# Patient Record
Sex: Female | Born: 1940 | Race: White | Hispanic: No | Marital: Married | Smoking: Former smoker
Health system: Southern US, Community
[De-identification: ages and names within clinical notes are randomized; demographics above are authoritative.]

## PROBLEM LIST (undated history)

## (undated) DIAGNOSIS — G8929 Other chronic pain: Secondary | ICD-10-CM

## (undated) DIAGNOSIS — K5790 Diverticulosis of intestine, part unspecified, without perforation or abscess without bleeding: Secondary | ICD-10-CM

## (undated) DIAGNOSIS — K5190 Ulcerative colitis, unspecified, without complications: Secondary | ICD-10-CM

## (undated) DIAGNOSIS — M5412 Radiculopathy, cervical region: Secondary | ICD-10-CM

## (undated) DIAGNOSIS — M7580 Other shoulder lesions, unspecified shoulder: Secondary | ICD-10-CM

## (undated) DIAGNOSIS — K559 Vascular disorder of intestine, unspecified: Secondary | ICD-10-CM

## (undated) DIAGNOSIS — L570 Actinic keratosis: Secondary | ICD-10-CM

## (undated) DIAGNOSIS — H269 Unspecified cataract: Secondary | ICD-10-CM

## (undated) DIAGNOSIS — K648 Other hemorrhoids: Secondary | ICD-10-CM

## (undated) DIAGNOSIS — E785 Hyperlipidemia, unspecified: Secondary | ICD-10-CM

## (undated) DIAGNOSIS — K209 Esophagitis, unspecified: Secondary | ICD-10-CM

## (undated) DIAGNOSIS — K2210 Ulcer of esophagus without bleeding: Secondary | ICD-10-CM

## (undated) DIAGNOSIS — G43909 Migraine, unspecified, not intractable, without status migrainosus: Secondary | ICD-10-CM

## (undated) DIAGNOSIS — R002 Palpitations: Secondary | ICD-10-CM

## (undated) DIAGNOSIS — R51 Headache: Secondary | ICD-10-CM

## (undated) DIAGNOSIS — T7840XA Allergy, unspecified, initial encounter: Secondary | ICD-10-CM

## (undated) DIAGNOSIS — R0789 Other chest pain: Secondary | ICD-10-CM

## (undated) DIAGNOSIS — D072 Carcinoma in situ of vagina: Secondary | ICD-10-CM

## (undated) DIAGNOSIS — F419 Anxiety disorder, unspecified: Secondary | ICD-10-CM

## (undated) DIAGNOSIS — M1990 Unspecified osteoarthritis, unspecified site: Secondary | ICD-10-CM

## (undated) DIAGNOSIS — K219 Gastro-esophageal reflux disease without esophagitis: Secondary | ICD-10-CM

## (undated) DIAGNOSIS — R519 Headache, unspecified: Secondary | ICD-10-CM

## (undated) HISTORY — PX: CATARACT EXTRACTION: SUR2

## (undated) HISTORY — DX: Radiculopathy, cervical region: M54.12

## (undated) HISTORY — DX: Anxiety disorder, unspecified: F41.9

## (undated) HISTORY — DX: Other hemorrhoids: K64.8

## (undated) HISTORY — DX: Vascular disorder of intestine, unspecified: K55.9

## (undated) HISTORY — PX: COLPOSCOPY: SHX161

## (undated) HISTORY — DX: Hyperlipidemia, unspecified: E78.5

## (undated) HISTORY — PX: OTHER SURGICAL HISTORY: SHX169

## (undated) HISTORY — DX: Other shoulder lesions, unspecified shoulder: M75.80

## (undated) HISTORY — DX: Other chest pain: R07.89

## (undated) HISTORY — DX: Carcinoma in situ of vagina: D07.2

## (undated) HISTORY — DX: Diverticulosis of intestine, part unspecified, without perforation or abscess without bleeding: K57.90

## (undated) HISTORY — DX: Headache, unspecified: R51.9

## (undated) HISTORY — DX: Ulcerative colitis, unspecified, without complications: K51.90

## (undated) HISTORY — PX: AUGMENTATION MAMMAPLASTY: SUR837

## (undated) HISTORY — DX: Unspecified osteoarthritis, unspecified site: M19.90

## (undated) HISTORY — DX: Esophagitis, unspecified: K20.9

## (undated) HISTORY — DX: Gastro-esophageal reflux disease without esophagitis: K21.9

## (undated) HISTORY — DX: Unspecified cataract: H26.9

## (undated) HISTORY — DX: Palpitations: R00.2

## (undated) HISTORY — DX: Ulcer of esophagus without bleeding: K22.10

## (undated) HISTORY — DX: Headache: R51

## (undated) HISTORY — DX: Actinic keratosis: L57.0

## (undated) HISTORY — DX: Other chronic pain: G89.29

## (undated) HISTORY — DX: Allergy, unspecified, initial encounter: T78.40XA

## (undated) HISTORY — PX: COLONOSCOPY: SHX174

---

## 1976-12-15 HISTORY — PX: VAGINAL HYSTERECTOMY: SUR661

## 1996-12-15 HISTORY — PX: OTHER SURGICAL HISTORY: SHX169

## 1997-08-15 DIAGNOSIS — D072 Carcinoma in situ of vagina: Secondary | ICD-10-CM

## 1997-08-15 HISTORY — DX: Carcinoma in situ of vagina: D07.2

## 1998-12-12 ENCOUNTER — Ambulatory Visit (HOSPITAL_COMMUNITY): Admission: RE | Admit: 1998-12-12 | Discharge: 1998-12-12 | Payer: Self-pay | Admitting: Gynecology

## 1998-12-12 ENCOUNTER — Encounter: Payer: Self-pay | Admitting: Gynecology

## 2000-02-27 ENCOUNTER — Encounter: Payer: Self-pay | Admitting: Gynecology

## 2000-02-27 ENCOUNTER — Ambulatory Visit (HOSPITAL_COMMUNITY): Admission: RE | Admit: 2000-02-27 | Discharge: 2000-02-27 | Payer: Self-pay | Admitting: Gynecology

## 2001-07-05 ENCOUNTER — Encounter: Payer: Self-pay | Admitting: Gynecology

## 2001-07-05 ENCOUNTER — Encounter: Admission: RE | Admit: 2001-07-05 | Discharge: 2001-07-05 | Payer: Self-pay | Admitting: Gynecology

## 2001-07-26 ENCOUNTER — Other Ambulatory Visit: Admission: RE | Admit: 2001-07-26 | Discharge: 2001-07-26 | Payer: Self-pay | Admitting: Gynecology

## 2002-07-23 ENCOUNTER — Emergency Department (HOSPITAL_COMMUNITY): Admission: EM | Admit: 2002-07-23 | Discharge: 2002-07-23 | Payer: Self-pay | Admitting: Emergency Medicine

## 2002-08-09 ENCOUNTER — Encounter: Payer: Self-pay | Admitting: Gynecology

## 2002-08-09 ENCOUNTER — Encounter: Admission: RE | Admit: 2002-08-09 | Discharge: 2002-08-09 | Payer: Self-pay | Admitting: Gynecology

## 2002-08-25 ENCOUNTER — Other Ambulatory Visit: Admission: RE | Admit: 2002-08-25 | Discharge: 2002-08-25 | Payer: Self-pay | Admitting: Gynecology

## 2002-09-23 ENCOUNTER — Encounter: Admission: RE | Admit: 2002-09-23 | Discharge: 2002-09-23 | Payer: Self-pay | Admitting: Gynecology

## 2002-09-23 ENCOUNTER — Encounter: Payer: Self-pay | Admitting: Gynecology

## 2003-08-28 ENCOUNTER — Other Ambulatory Visit: Admission: RE | Admit: 2003-08-28 | Discharge: 2003-08-28 | Payer: Self-pay | Admitting: Gynecology

## 2003-12-29 ENCOUNTER — Encounter: Admission: RE | Admit: 2003-12-29 | Discharge: 2003-12-29 | Payer: Self-pay | Admitting: Gynecology

## 2004-09-24 ENCOUNTER — Other Ambulatory Visit: Admission: RE | Admit: 2004-09-24 | Discharge: 2004-09-24 | Payer: Self-pay | Admitting: Gynecology

## 2005-01-14 ENCOUNTER — Ambulatory Visit: Payer: Self-pay | Admitting: Gastroenterology

## 2005-05-01 ENCOUNTER — Encounter: Admission: RE | Admit: 2005-05-01 | Discharge: 2005-05-01 | Payer: Self-pay | Admitting: Gynecology

## 2005-10-31 ENCOUNTER — Other Ambulatory Visit: Admission: RE | Admit: 2005-10-31 | Discharge: 2005-10-31 | Payer: Self-pay | Admitting: Obstetrics and Gynecology

## 2006-05-29 ENCOUNTER — Encounter: Admission: RE | Admit: 2006-05-29 | Discharge: 2006-05-29 | Payer: Self-pay | Admitting: Obstetrics and Gynecology

## 2007-07-02 ENCOUNTER — Encounter: Admission: RE | Admit: 2007-07-02 | Discharge: 2007-07-02 | Payer: Self-pay | Admitting: Obstetrics and Gynecology

## 2007-09-16 ENCOUNTER — Ambulatory Visit (HOSPITAL_COMMUNITY): Admission: RE | Admit: 2007-09-16 | Discharge: 2007-09-16 | Payer: Self-pay | Admitting: Emergency Medicine

## 2008-01-11 ENCOUNTER — Encounter: Payer: Self-pay | Admitting: Internal Medicine

## 2008-01-24 ENCOUNTER — Encounter: Payer: Self-pay | Admitting: Internal Medicine

## 2008-01-24 HISTORY — PX: NM MYOCAR PERF WALL MOTION: HXRAD629

## 2008-04-13 ENCOUNTER — Emergency Department (HOSPITAL_COMMUNITY): Admission: EM | Admit: 2008-04-13 | Discharge: 2008-04-13 | Payer: Self-pay | Admitting: Family Medicine

## 2008-06-05 DIAGNOSIS — K559 Vascular disorder of intestine, unspecified: Secondary | ICD-10-CM

## 2008-06-05 DIAGNOSIS — K5090 Crohn's disease, unspecified, without complications: Secondary | ICD-10-CM | POA: Insufficient documentation

## 2008-06-05 HISTORY — DX: Vascular disorder of intestine, unspecified: K55.9

## 2008-06-06 ENCOUNTER — Ambulatory Visit: Payer: Self-pay | Admitting: Gastroenterology

## 2008-06-06 LAB — CONVERTED CEMR LAB
ALT: 14 units/L (ref 0–35)
Albumin: 4 g/dL (ref 3.5–5.2)
Alkaline Phosphatase: 76 units/L (ref 39–117)
Basophils Absolute: 0 10*3/uL (ref 0.0–0.1)
Basophils Relative: 0.7 % (ref 0.0–1.0)
Bilirubin, Direct: 0.1 mg/dL (ref 0.0–0.3)
Calcium: 9.1 mg/dL (ref 8.4–10.5)
GFR calc non Af Amer: 89 mL/min
HCT: 37.2 % (ref 36.0–46.0)
Hemoglobin: 12.7 g/dL (ref 12.0–15.0)
Monocytes Absolute: 0.5 10*3/uL (ref 0.1–1.0)
Monocytes Relative: 13.6 % — ABNORMAL HIGH (ref 3.0–12.0)
RBC: 3.85 M/uL — ABNORMAL LOW (ref 3.87–5.11)
RDW: 12.6 % (ref 11.5–14.6)
Sodium: 137 meq/L (ref 135–145)
TSH: 1.43 microintl units/mL (ref 0.35–5.50)
Total Bilirubin: 1.2 mg/dL (ref 0.3–1.2)
Total Protein: 7.4 g/dL (ref 6.0–8.3)

## 2008-06-07 ENCOUNTER — Encounter: Payer: Self-pay | Admitting: Gastroenterology

## 2008-06-09 ENCOUNTER — Telehealth: Payer: Self-pay | Admitting: Gastroenterology

## 2008-06-20 ENCOUNTER — Telehealth: Payer: Self-pay | Admitting: Gastroenterology

## 2008-06-20 ENCOUNTER — Ambulatory Visit: Payer: Self-pay | Admitting: Gastroenterology

## 2008-06-20 DIAGNOSIS — R197 Diarrhea, unspecified: Secondary | ICD-10-CM

## 2008-06-21 ENCOUNTER — Encounter: Payer: Self-pay | Admitting: Gastroenterology

## 2008-06-21 ENCOUNTER — Ambulatory Visit: Payer: Self-pay | Admitting: Gastroenterology

## 2008-06-21 LAB — HM COLONOSCOPY

## 2008-06-23 ENCOUNTER — Encounter: Payer: Self-pay | Admitting: Gastroenterology

## 2008-06-26 ENCOUNTER — Telehealth: Payer: Self-pay | Admitting: Gastroenterology

## 2008-07-24 ENCOUNTER — Ambulatory Visit: Payer: Self-pay | Admitting: Gastroenterology

## 2008-07-24 DIAGNOSIS — F411 Generalized anxiety disorder: Secondary | ICD-10-CM | POA: Insufficient documentation

## 2008-07-24 LAB — CONVERTED CEMR LAB: Transferrin: 271.9 mg/dL (ref >=212.0)

## 2008-08-02 ENCOUNTER — Telehealth: Payer: Self-pay | Admitting: Gastroenterology

## 2008-08-09 ENCOUNTER — Telehealth: Payer: Self-pay | Admitting: Gastroenterology

## 2008-08-25 ENCOUNTER — Encounter: Admission: RE | Admit: 2008-08-25 | Discharge: 2008-08-25 | Payer: Self-pay | Admitting: Obstetrics and Gynecology

## 2008-10-27 ENCOUNTER — Ambulatory Visit: Payer: Self-pay | Admitting: Gastroenterology

## 2009-06-12 LAB — CONVERTED CEMR LAB: Pap Smear: NORMAL

## 2009-06-12 LAB — HM MAMMOGRAPHY: HM Mammogram: NORMAL

## 2009-06-20 ENCOUNTER — Encounter: Payer: Self-pay | Admitting: Internal Medicine

## 2009-08-27 ENCOUNTER — Encounter: Admission: RE | Admit: 2009-08-27 | Discharge: 2009-08-27 | Payer: Self-pay | Admitting: Obstetrics and Gynecology

## 2009-11-05 ENCOUNTER — Telehealth: Payer: Self-pay | Admitting: Gastroenterology

## 2009-11-06 ENCOUNTER — Ambulatory Visit: Payer: Self-pay | Admitting: Gastroenterology

## 2009-11-06 DIAGNOSIS — R1110 Vomiting, unspecified: Secondary | ICD-10-CM | POA: Insufficient documentation

## 2009-11-06 DIAGNOSIS — K219 Gastro-esophageal reflux disease without esophagitis: Secondary | ICD-10-CM | POA: Insufficient documentation

## 2009-11-06 DIAGNOSIS — R300 Dysuria: Secondary | ICD-10-CM

## 2009-11-06 DIAGNOSIS — K5900 Constipation, unspecified: Secondary | ICD-10-CM | POA: Insufficient documentation

## 2009-11-06 LAB — CONVERTED CEMR LAB
Hemoglobin, Urine: NEGATIVE
Leukocytes, UA: NEGATIVE
Nitrite: NEGATIVE
Total Protein, Urine: NEGATIVE mg/dL
Urine Glucose: NEGATIVE mg/dL
Urobilinogen, UA: 0.2 (ref 0.0–1.0)
pH: 7.5 (ref 5.0–8.0)

## 2009-11-14 ENCOUNTER — Ambulatory Visit: Payer: Self-pay | Admitting: Gastroenterology

## 2009-11-14 DIAGNOSIS — K219 Gastro-esophageal reflux disease without esophagitis: Secondary | ICD-10-CM

## 2009-11-14 HISTORY — DX: Gastro-esophageal reflux disease without esophagitis: K21.9

## 2010-01-08 ENCOUNTER — Ambulatory Visit: Payer: Self-pay | Admitting: Gastroenterology

## 2010-02-05 ENCOUNTER — Ambulatory Visit: Payer: Self-pay | Admitting: Internal Medicine

## 2010-02-05 DIAGNOSIS — R51 Headache: Secondary | ICD-10-CM | POA: Insufficient documentation

## 2010-02-05 DIAGNOSIS — L259 Unspecified contact dermatitis, unspecified cause: Secondary | ICD-10-CM | POA: Insufficient documentation

## 2010-02-05 DIAGNOSIS — R519 Headache, unspecified: Secondary | ICD-10-CM | POA: Insufficient documentation

## 2010-02-07 ENCOUNTER — Ambulatory Visit: Payer: Self-pay | Admitting: Internal Medicine

## 2010-02-07 LAB — CONVERTED CEMR LAB
ALT: 45 units/L — ABNORMAL HIGH (ref 0–35)
Albumin: 4.7 g/dL (ref 3.5–5.2)
BUN: 14 mg/dL (ref 6–23)
Bilirubin, Direct: 0.2 mg/dL (ref 0.0–0.3)
Chloride: 105 meq/L (ref 96–112)
Cholesterol: 222 mg/dL — ABNORMAL HIGH (ref 0–200)
LDL Cholesterol: 139 mg/dL — ABNORMAL HIGH (ref 0–99)
Potassium: 4.4 meq/L (ref 3.5–5.3)
TSH: 2.723 microintl units/mL (ref 0.350–4.500)
Total Bilirubin: 0.9 mg/dL (ref 0.3–1.2)
Total CHOL/HDL Ratio: 3.1

## 2010-02-08 ENCOUNTER — Encounter: Payer: Self-pay | Admitting: Internal Medicine

## 2010-02-13 ENCOUNTER — Ambulatory Visit: Payer: Self-pay | Admitting: Internal Medicine

## 2010-04-11 ENCOUNTER — Ambulatory Visit: Payer: Self-pay | Admitting: Internal Medicine

## 2010-07-09 ENCOUNTER — Telehealth: Payer: Self-pay | Admitting: Internal Medicine

## 2010-07-11 ENCOUNTER — Encounter: Payer: Self-pay | Admitting: Internal Medicine

## 2010-07-11 LAB — CONVERTED CEMR LAB
AST: 24 units/L (ref 0–37)
Indirect Bilirubin: 0.6 mg/dL (ref 0.0–0.9)
Total Bilirubin: 0.7 mg/dL (ref 0.3–1.2)

## 2010-07-16 ENCOUNTER — Ambulatory Visit: Payer: Self-pay | Admitting: Internal Medicine

## 2010-07-16 DIAGNOSIS — J309 Allergic rhinitis, unspecified: Secondary | ICD-10-CM

## 2010-07-29 ENCOUNTER — Ambulatory Visit: Payer: Self-pay | Admitting: Internal Medicine

## 2010-07-29 DIAGNOSIS — L0390 Cellulitis, unspecified: Secondary | ICD-10-CM

## 2010-07-29 DIAGNOSIS — L0291 Cutaneous abscess, unspecified: Secondary | ICD-10-CM | POA: Insufficient documentation

## 2010-09-10 ENCOUNTER — Encounter: Admission: RE | Admit: 2010-09-10 | Discharge: 2010-09-10 | Payer: Self-pay | Admitting: Obstetrics and Gynecology

## 2010-11-14 ENCOUNTER — Encounter: Payer: Self-pay | Admitting: Internal Medicine

## 2011-01-16 NOTE — Assessment & Plan Note (Signed)
Summary: INFECTED SPOT ON ARM  /HEA   Vital Signs:  Patient profile:   70 year old female Weight:      135.75 pounds BMI:     22.67 O2 Sat:      98 % on Room air Temp:     98.2 degrees F oral Pulse rate:   60 / minute Pulse rhythm:   regular Resp:     16 per minute BP sitting:   126 / 60  (right arm) Cuff size:   regular  Vitals Entered By: Glendell Docker CMA (July 29, 2010 10:40 AM)  O2 Flow:  Room air CC: Left elbow infection Is Patient Diabetic? No Pain Assessment Patient in pain? no        Primary Care [facility]:  Dondra Spry DO  CC:  Left elbow infection.  History of Present Illness: 70 y/o white female c/o abrasion on left elbow  she scraped left elbow  against storm door 10 days ago  states the area has gotten bigger over the past 2 weeks. She denies temperature no drainage  Allergies (verified): No Known Drug Allergies  Past History:  Past Medical History: Anxiety Disorder - effexor helps  75 mg  psychiatrist  Dr. Evelene Croon Chronic Headaches - migraines ( good response to metoprolol) Mild Ulcerative Colitis    Atypical chest pain - Stress test 01/24/2008    EF 86 %, breast attenuation,  no significant ischemia Generalized osteoarthritis of hands  Past Surgical History: Hysterectomy partial 1978      Family History: Family History of Breast Cancer: Mother Family History of Colon Cancer: Grandfather ? Family History of Prostate Cancer: Father Family History of Inflammatory Bowel Disease: Sister Family History of Diabetes: Mother Family History of Heart Disease: Mother (died age 19)  dad left family when pt was age 55      Social History: Occupation: Retired (Public affairs consultant business) Married 20 yrs (second marriage) 1 son  (lives in [place])  Patient is a former smoker. -stopped 20+ years ago Alcohol Use - yes-1 glass every few days Daily Caffeine Use-2 cups daily Illicit Drug Use - no   Patient gets regular exercise. Former smoker - Quit 22  yrs ago - smoked on and off 10 yrs (1ppd)  Physical Exam  General:  alert, well-developed, and well-nourished.   Lungs:  normal respiratory effort and normal breath sounds.   Heart:  normal rate, regular rhythm, and no gallop.   Skin:  1-2 cm annular shallow wound left elbow,  mild redness,  no drainage   Impression & Recommendations:  Problem # 1:  CELLULITIS (ICD-682.9) pt suffered abrasion on left elbow 10 days ago.  scraped against storm door. area getting bigger and not healing pt with mild cellulitus.  1-2 cm shallow abrasion.  tx with keflex.  pt advised to keep area covered. Call our office if your symptoms do not  improve or gets worse. see is allergic to triple abx ointment  Her updated medication list for this problem includes:    Cephalexin 500 Mg Caps (Cephalexin) ..... One cap three times a day  Complete Medication List: 1)  Metoprolol Succinate 25 Mg Tb24 (Metoprolol succinate) .... Take 1 tablet by mouth once a day for headaches 2)  Venlafaxine Hcl 75 Mg Xr24h-cap (Venlafaxine hcl) .... One by mouth once daily 3)  Tums 500 Mg Chew (Calcium carbonate antacid) .... Two to three tablets by mouth daily as needed for heartburn. will take more if needed 4)  Miralax  Powd (Polyethylene glycol 3350) .... Take once or twice daily as needed 5)  Zantac 150 Mg Tabs (Ranitidine hcl) .... Take 1 tablet by mouth once a day as needed 6)  Multivitamins Tabs (Multiple vitamin) .... Take 1 tablet by mouth once a day 7)  Zostavax 95621 Unt/0.27ml Solr (Zoster vaccine live) .... Administer vaccine x 1 8)  Sumatriptan Succinate 50 Mg Tabs (Sumatriptan succinate) .... One by mouth once daily as needed for severe migraine headache 9)  Cetirizine Hcl 10 Mg Tabs (Cetirizine hcl) .... Take 1 tablet by mouth once a day as needed 10)  Azelastine Hcl 137 Mcg/spray Soln (Azelastine hcl) .... 2 sprays each nostril two times a day 11)  Fluticasone Propionate 50 Mcg/act Susp (Fluticasone propionate)  .... 2 sprays each nostril once daily 12)  Cephalexin 500 Mg Caps (Cephalexin) .... One cap three times a day  Patient Instructions: 1)  Call our office if your symptoms do not  improve or gets worse. Prescriptions: CEPHALEXIN 500 MG CAPS (CEPHALEXIN) one cap three times a day  #21 x 0   Entered and Authorized by:   D. Thomos Lemons DO   Signed by:   D. Thomos Lemons DO on 07/29/2010   Method used:   Electronically to        [facility]  [phone]* (retail)       [address]       [place], [place]  [address]       Ph: [phone] or [phone]       Fax: [phone]   RxID:   [id number]    Immunization History:  Tetanus/Td Immunization History:    Tetanus/Td:  historical (05/28/2004)  Pneumovax Immunization History:    Pneumovax:  historical (05/27/2005)   Current Allergies (reviewed today): No known allergies

## 2011-01-16 NOTE — Letter (Signed)
   [place] [facility] [address] [place], [place]  [address]  [place] Phone: [phone]      February 08, 2010   [facility] [place] [address] [place], [place] [address]  RE:  LAB RESULTS  Dear  Ms. SCHREFFLER,  The following is an interpretation of your most recent lab tests.  Please take note of any instructions provided or changes to medications that have resulted from your lab work.  ELECTROLYTES:  Good - no changes needed  KIDNEY FUNCTION TESTS:  Good - no changes needed  LIVER FUNCTION TESTS:  Stable - no changes needed  LIPID PANEL:  Fair - review at your next visit Triglyceride: 54   Cholesterol: 222   LDL: 139   HDL: 72   Chol/HDL%:  3.1 Ratio  THYROID STUDIES:  Thyroid studies normal TSH: 2.723      Diabetes blood test - normal   Please decrease your intake of saturated fats.  Use fiber supplement once daily.  I will further discuss your lab results at your next follow up appointment.     Sincerely Yours,    Dr. Thomos Lemons

## 2011-01-16 NOTE — Assessment & Plan Note (Signed)
Summary: allergies/mhf   Vital Signs:  Patient profile:   70 year old female Height:      65 inches Weight:      136 pounds BMI:     22.71 O2 Sat:      98 % on Room air Temp:     98.0 degrees F oral Pulse rate:   57 / minute Pulse rhythm:   regular Resp:     16 per minute BP sitting:   114 / 70  (right arm) Cuff size:   regular  Vitals Entered By: Glendell Docker CMA (July 16, 2010 10:49 AM)  O2 Flow:  Room air CC: Rm 3- Allergy flare up Is Patient Diabetic? No Pain Assessment Patient in pain? no       Does patient need assistance? Functional Status Self care Ambulation Normal Comments c/o sneezin, nasal drainage, eye irritation, taken over the counter allergy releif, with some relief, but does not last through out the day, ongoing and has worsened over the past 3 months   Primary Care Provider:  DThomos Lemons DO  CC:  Rm 3- Allergy flare up.  History of Present Illness:       This is a 70 year old female who presents with allergic rhinitis.  The patient complains of runny nose and nasal congestion.  Patient states symptoms are worse with exposure to indoors and outdoors.  Symptoms improve with antihistamines and steroid nasal spray.    Headaches - stable  GERD - able to stop PPI.  using zantac as needed H. Pylori antibody was negative  Preventive Screening-Counseling & Management  Alcohol-Tobacco     Smoking Status: quit  Allergies (verified): No Known Drug Allergies  Past History:  Past Medical History: Anxiety Disorder - effexor helps  75 mg  psychiatrist  Dr. Evelene Croon Chronic Headaches - migraines ( good response to metoprolol) Mild Ulcerative Colitis   Atypical chest pain - Stress test 01/24/2008    EF 86 %, breast attenuation,  no significant ischemia Generalized osteoarthritis of hands  Past Surgical History: Hysterectomy partial 1978      Family History: Family History of Breast Cancer: Mother Family History of Colon Cancer: Grandfather  ? 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 -stopped 20+ years ago Alcohol Use - yes-1 glass every few days Daily Caffeine Use-2 cups daily  Illicit Drug Use - no   Patient gets regular exercise. Former smoker - Quit 22 yrs ago - smoked on and off 10 yrs (1ppd)  Physical Exam  General:  alert, well-developed, and well-nourished.   Ears:  R ear normal and L ear normal.   Nose:  nasal dischargemucosal pallor.   Mouth:  Oral mucosa and oropharynx without lesions or exudates.  Teeth in good repair. Lungs:  normal respiratory effort, normal breath sounds, no crackles, and no wheezes.   Heart:  normal rate, regular rhythm, no murmur, and no gallop.     Impression & Recommendations:  Problem # 1:  ALLERGIC RHINITIS (ICD-477.9) Assessment Deteriorated chronic allergic rhinitis.  add astelin nose spray  Her updated medication list for this problem includes:    Cetirizine Hcl 10 Mg Tabs (Cetirizine  hcl) ..... Take 1 tablet by mouth once a day as needed    Azelastine Hcl 137 Mcg/spray Soln (Azelastine hcl) .Marland Kitchen... 2 sprays each nostril two times a day    Fluticasone Propionate 50 Mcg/act Susp (Fluticasone propionate) .Marland Kitchen... 2 sprays each nostril once daily  Problem # 2:  GERD (ICD-530.81) pt able to taper off PPI.  she take zantac as needed.  H Pylori antibody was negative Her updated medication list for this problem includes:    Tums 500 Mg Chew (Calcium carbonate antacid) .Marland Kitchen..Marland Kitchen Two to three tablets by mouth daily as needed for heartburn. will take more if needed    Zantac 150 Mg Tabs (Ranitidine hcl) .Marland Kitchen... Take 1 tablet by mouth once a day as  needed  Complete Medication List: 1)  Metoprolol Succinate 25 Mg Tb24 (Metoprolol succinate) .... Take 1 tablet by mouth once a day for headaches 2)  Venlafaxine Hcl 75 Mg Xr24h-cap (Venlafaxine hcl) .... One by mouth once daily 3)  Tums 500 Mg Chew (Calcium carbonate antacid) .... Two to three tablets by mouth daily as needed for heartburn. will take more if needed 4)  Miralax Powd (Polyethylene glycol 3350) .... Take once or twice daily as needed 5)  Zantac 150 Mg Tabs (Ranitidine hcl) .... Take 1 tablet by mouth once a day as needed 6)  Multivitamins Tabs (Multiple vitamin) .... Take 1 tablet by mouth once a day 7)  Zostavax 16109 Unt/0.54ml Solr (Zoster vaccine live) .... Administer vaccine x 1 8)  Sumatriptan Succinate 50 Mg Tabs (Sumatriptan succinate) .... One by mouth once daily as needed for severe migraine headache 9)  Cetirizine Hcl 10 Mg Tabs (Cetirizine hcl) .... Take 1 tablet by mouth once a day as needed 10)  Azelastine Hcl 137 Mcg/spray Soln (Azelastine hcl) .... 2 sprays each nostril two times a day 11)  Fluticasone Propionate 50 Mcg/act Susp (Fluticasone propionate) .... 2 sprays each nostril once daily 12)  Cephalexin 500 Mg Caps (Cephalexin) .... One cap three times a day  Patient Instructions: 1)  Please schedule a follow-up appointment in 1 year. Prescriptions: SUMATRIPTAN SUCCINATE 50 MG TABS (SUMATRIPTAN SUCCINATE) one by mouth once daily as needed for severe migraine headache  #10 x 5   Entered and Authorized by:   D. Thomos Lemons DO   Signed by:   D. Thomos Lemons DO on 07/16/2010   Method used:   Electronically to        [facility]  [phone]* (retail)       [address]       [place], [place]  [address]       Ph: [phone] or [phone]       Fax: [phone]   RxID:   [id number] FLUTICASONE PROPIONATE 50 MCG/ACT SUSP (FLUTICASONE PROPIONATE) 2 sprays each nostril once daily  #1 x 5   Entered and Authorized by:   D.  Thomos Lemons DO   Signed by:   D. Thomos Lemons DO on 07/16/2010   Method used:   Electronically to        [facility]  [phone]* (retail)       [address]       [place], [place]  [address]       Ph: [phone] or [phone]       Fax: [phone]   RxID:   [id number] AZELASTINE HCL 137 MCG/SPRAY SOLN (AZELASTINE  HCL) 2 sprays each nostril two times a day  #1 x 5   Entered and Authorized by:   D. Thomos Lemons DO   Signed by:   D. Thomos Lemons DO on 07/16/2010   Method used:   Electronically to        [facility]  [phone]* (retail)       [address]       [place], [place]  [address]       Ph: [phone] or [phone]       Fax: [phone]   RxID:   [id number]   Current Allergies (reviewed today): No known allergies    Preventive Care Screening  Last Tetanus Booster:    Date:  07/31/2008    Results:  Historical

## 2011-01-16 NOTE — Letter (Signed)
Summary: [facility]   Imported By: Lanelle Bal 02/26/2010 11:11:50  _____________________________________________________________________  External Attachment:    Type:   Image     Comment:   External Document

## 2011-01-16 NOTE — Assessment & Plan Note (Signed)
Summary: 2 MONTH FOLLOW UP/[facility] rsc with pt/mhf   Vital Signs:  Patient profile:   70 year old female Height:      65 inches Weight:      137 pounds BMI:     22.88 O2 Sat:      99 % on Room air Pulse rate:   60 / minute BP sitting:   122 / 74  (left arm) Cuff size:   regular  Vitals Entered By: Payton Spark CMA (April 11, 2010 3:49 PM)  O2 Flow:  Room air CC: 2 month F/U   Primary Care Provider:  Dondra Spry DO  CC:  2 month F/U.  History of Present Illness: 70 y/o white female for f/u. GERD better with PPI use and antireflux measures she never completed blood test for h. pylori  dysthymia - no change.   stays active.  working  Current Medications (verified): 1)  Metoprolol Succinate 25 Mg  Tb24 (Metoprolol Succinate) .... Take 1 Tablet By Mouth Once A Day For Headaches 2)  Effexor Xr 75 Mg Xr24h-Cap (Venlafaxine Hcl) .Marland Kitchen.. 1 Tablet By Mouth Once Daily 3)  Tums 500 Mg Chew (Calcium Carbonate Antacid) .... Two To Three Tablets By Mouth Daily As Needed For Heartburn. Will Take More If Needed 4)  Miralax   Powd (Polyethylene Glycol 3350) .... Take Once or Twice Daily As Needed 5)  Nexium 40 Mg  Cpdr (Esomeprazole Magnesium) .Marland Kitchen.. 1 Capsule Each Day 30 Minutes Before Meal 6)  Multivitamins  Tabs (Multiple Vitamin) .... Take 1 Tablet By Mouth Once A Day 7)  Zostavax 16109 Unt/0.12ml Solr (Zoster Vaccine Live) .... Administer Vaccine X 1 8)  Triamcinolone Acetonide 0.1 % Crea (Triamcinolone Acetonide) .... Apply Two Times A Day To Left Arm  Allergies (verified): No Known Drug Allergies  Past History:  Past Medical History: Anxiety Disorder - effexor helps  75 mg  psychiatrist  Dr. Evelene Croon Chronic Headaches - migraines ( good response to metoprolol) Mild Ulcerative Colitis  Atypical chest pain - Stress test 01/24/2008    EF 86 %, breast attenuation,  no significant ischemia Generalized osteoarthritis of hands  Past Surgical History: Hysterectomy partial 1978     Family  History: Family History of Breast Cancer: Mother Family History of Colon Cancer: Grandfather ? 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 -stopped 20+ years ago Alcohol Use - yes-1 glass every few days Daily Caffeine Use-2 cups daily Illicit Drug Use - no   Patient gets regular exercise. Former smoker - Quit 22 yrs ago - smoked on and off 10 yrs (1ppd)  Physical Exam  General:  alert, well-developed, and well-nourished.   Lungs:  normal respiratory effort, normal breath sounds, no crackles, and no wheezes.   Heart:  normal rate, regular rhythm, no murmur, and no gallop.   Extremities:  No lower extremity edema  Neurologic:  cranial nerves II-XII intact and gait normal.   Psych:  normally interactive, good eye contact, not anxious appearing, and not depressed appearing.     Impression & Recommendations:  Problem # 1:  GERD (ICD-530.81) Assessment Unchanged stable.  reschedule testing for H. Pylori  Her updated medication list for  this problem includes:    Tums 500 Mg Chew (Calcium carbonate antacid) .Marland Kitchen..Marland Kitchen Two to three tablets by mouth daily as needed for heartburn. will take more if needed    Nexium 40 Mg Cpdr (Esomeprazole magnesium) .Marland Kitchen... 1 capsule each day 30 minutes before meal  Future Orders: T- * Misc. Laboratory test [id number]) ... 06/11/2010  Problem # 2:  TRANSAMINASES, SERUM, ELEVATED (ICD-790.4) Mild isolated elevation in ALT.  I suspect drug effect.  monitor LFTs Future Orders: T-Hepatic Function [id number]) ... 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  continue same effexor dose  Complete  Medication List: 1)  Metoprolol Succinate 25 Mg Tb24 (Metoprolol succinate) .... Take 1 tablet by mouth once a day for headaches 2)  Venlafaxine Hcl 75 Mg Xr24h-cap (Venlafaxine hcl) .... One by mouth once daily 3)  Tums 500 Mg Chew (Calcium carbonate antacid) .... Two to three tablets by mouth daily as needed for heartburn. will take more if needed 4)  Miralax Powd (Polyethylene glycol 3350) .... Take once or twice daily as needed 5)  Nexium 40 Mg Cpdr (Esomeprazole magnesium) .Marland Kitchen.. 1 capsule each day 30 minutes before meal 6)  Multivitamins Tabs (Multiple vitamin) .... Take 1 tablet by mouth once a day 7)  Zostavax 91478 Unt/0.60ml Solr (Zoster vaccine live) .... Administer vaccine x 1 8)  Triamcinolone Acetonide 0.1 % Crea (Triamcinolone acetonide) .... Apply two times a day to left arm  Patient Instructions: 1)  Use OTC zantac 150 mg two times a day 2)  If you experience GERD symptoms, switch to OTC omeprazole 20 mg once daily 3)  Please schedule a follow-up appointment in 1 year. Prescriptions: VENLAFAXINE HCL 75 MG XR24H-CAP (VENLAFAXINE HCL) one by mouth once daily  #90 x 3   Entered and Authorized by:   D. Thomos Lemons DO   Signed by:   D. Thomos Lemons DO on 04/11/2010   Method used:   Print then Give to Patient   RxID:   [id number] METOPROLOL SUCCINATE 25 MG  TB24 (METOPROLOL SUCCINATE) Take 1 tablet by mouth once a day for headaches  #90 x 3   Entered and Authorized by:   D. Thomos Lemons DO   Signed by:   D. Thomos Lemons DO on 04/11/2010   Method used:   Print then Give to Patient   RxID:   [id number]

## 2011-01-16 NOTE — Letter (Signed)
Summary: [facility]  [facility]   Imported By: Lanelle Bal 12/04/2010 11:14:30  _____________________________________________________________________  External Attachment:    Type:   Image     Comment:   External Document

## 2011-01-16 NOTE — Assessment & Plan Note (Signed)
Summary: F/U EGD/ dfs   History of Present Illness Visit Type: Follow-up Visit Primary GI MD: Sheryn Bison MD FACP FAGA Primary Provider: n/a Requesting Provider: n/a Chief Complaint: follow-up EGD History of Present Illness:   [name] is much better symptomatically on daily Nexium and antireflux maneuvers. Her endoscopy showed erosive esophagitis and a peptic stricture was dilated. Otherwise she is doing well and denies lower gastrointestinal problems. There have been some question in the past idiopathic colitis but she is not on amino salicylate therapy at this time. She does have chronic constipation for which she uses MiraLax.  Her main complaint is a globus sensation in her throat, throat clearing, and hoarseness. Most of her problems are during the daytime hours and not at night.   GI Review of Systems      Denies abdominal pain, acid reflux, belching, bloating, chest pain, dysphagia with liquids, dysphagia with solids, heartburn, loss of appetite, nausea, vomiting, vomiting blood, weight loss, and  weight gain.        Denies anal fissure, black tarry stools, change in bowel habit, constipation, diarrhea, diverticulosis, fecal incontinence, heme positive stool, hemorrhoids, irritable bowel syndrome, jaundice, light color stool, liver problems, rectal bleeding, and  rectal pain.    Current Medications (verified): 1)  Metoprolol Succinate 25 Mg  Tb24 (Metoprolol Succinate) .... Take 1 Tablet By Mouth Once A Day For Headaches 2)  Asacol 400 Mg  Tbec (Mesalamine) .... Take Two Tabs By Mouth Two Times A Day 3)  Effexor Xr 75 Mg Xr24h-Cap (Venlafaxine Hcl) .Marland Kitchen.. 1 Tablet By Mouth Once Daily 4)  Vitamin D (Ergocalciferol) 50000 Unit Caps (Ergocalciferol) .... One Tablet By Mouth Once A Week 5)  Tums 500 Mg Chew (Calcium Carbonate Antacid) .... Two To Three Tablets By Mouth Daily As Needed For Heartburn. Will Take More If Needed 6)  Miralax   Powd (Polyethylene Glycol 3350) .... Take Once  or Twice Daily As Needed 7)  Nexium 40 Mg  Cpdr (Esomeprazole Magnesium) .Marland Kitchen.. 1 Capsule Each Day 30 Minutes Before Meal  Allergies (verified): No Known Drug Allergies  Past History:  Past medical, surgical, family and social histories (including risk factors) reviewed for relevance to current acute and chronic problems.  Past Medical History: Reviewed history from 06/20/2008 and no changes required. Anxiety Disorder Chronic Headaches Ulcerative Colitis  Past Surgical History: Reviewed history from 06/05/2008 and no changes required. Hysterectomy partial 1978  Family History: Reviewed history from 06/06/2008 and no changes required. Family History of Breast Cancer: Mother Family History of Colon Cancer: Grandfather ? Family History of Prostate Cancer: Father Family History of Inflammatory Bowel Disease: Sister Family History of Diabetes: Mother Family History of Heart Disease: Mother  Social History: Reviewed history from 06/06/2008 and no changes required. Occupation: Retired Patient is a former smoker. -stopped 20+ years ago Alcohol Use - yes-1 glass every few days Daily Caffeine Use-2 cups daily Illicit Drug Use - no Patient gets regular exercise.  Review of Systems       The patient complains of cough.  The patient denies allergy/sinus, anemia, anxiety-new, arthritis/joint pain, back pain, blood in urine, breast changes/lumps, change in vision, confusion, coughing up blood, depression-new, fainting, fatigue, fever, headaches-new, hearing problems, heart murmur, heart rhythm changes, itching, muscle pains/cramps, night sweats, nosebleeds, shortness of breath, skin rash, sleeping problems, sore throat, swelling of feet/legs, swollen lymph glands, thirst - excessive, urination - excessive, urination changes/pain, urine leakage, vision changes, and voice change.    Vital Signs:  Patient profile:  70 year old female Height:      65 inches Weight:      140 pounds Pulse  rate:   68 / minute Pulse rhythm:   regular BP sitting:   122 / 64  (left arm)  Vitals Entered By: Milford Cage NCMA (January 08, 2010 3:52 PM)  Physical Exam  General:  Well developed, well nourished, no acute distress.healthy appearing.   Head:  Normocephalic and atraumatic. Eyes:  PERRLA, no icterus.exam deferred to patient's ophthalmologist.   Neck:  Supple; no masses or thyromegaly. Lungs:  Clear throughout to auscultation. Heart:  Regular rate and rhythm; no murmurs, rubs,  or bruits. Abdomen:  Soft, nontender and nondistended. No masses, hepatosplenomegaly or hernias noted. Normal bowel sounds. Extremities:  No clubbing, cyanosis, edema or deformities noted. Neurologic:  Alert and  oriented x4;  grossly normal neurologically. Cervical Nodes:  No significant cervical adenopathy. Psych:  Alert and cooperative. Normal mood and affect.   Impression & Recommendations:  Problem # 1:  GERD (ICD-530.81) Assessment Improved We change her Nexium to 30 minutes before supper for better acid control during the day. I've urged her to continue regular PPI therapy for several months to control gastroesophageal difficulty she is having. I see no need for further workup at this time. She may need periodic dilations depending on her dysphagia.  Problem # 2:  CONSTIPATION (ICD-564.00) Assessment: Improved Continue MiraLax as needed  Problem # 3:  GENERALIZED ANXIETY DISORDER (ICD-300.02) Assessment: Improved Continue Effexor X. are 75 mg a day.  Patient Instructions: 1)  The medication list was reviewed and reconciled.  All changed / newly prescribed medications were explained.  A complete medication list was provided to the patient / caregiver. 2)  Please continue current medications.  3)  Avoid foods high in acid content ( tomatoes, citrus juices, spicy foods) . Avoid eating within 3 to 4 hours of lying down or before exercising. Do not over eat; try smaller more frequent meals. Elevate  head of bed four inches when sleeping.  4)  Please schedule a follow-up appointment as needed.   Appended Document: F/U EGD/ dfs    Clinical Lists Changes  Medications: Changed medication from NEXIUM 40 MG  CPDR (ESOMEPRAZOLE MAGNESIUM) 1 capsule each day 30 minutes before meal to NEXIUM 40 MG  CPDR (ESOMEPRAZOLE MAGNESIUM) 1 capsule each day 30 minutes before meal - Signed Rx of NEXIUM 40 MG  CPDR (ESOMEPRAZOLE MAGNESIUM) 1 capsule each day 30 minutes before meal;  #30 x 11;  Signed;  Entered by: Ashok Cordia RN;  Authorized by: Mardella Layman MD [facility];  Method used: Electronically to [facility]  [phone]*, [address] [place], [place], [place]  [address], Ph: [phone] or [phone], Fax: [phone]    Prescriptions: NEXIUM 40 MG  CPDR (ESOMEPRAZOLE MAGNESIUM) 1 capsule each day 30 minutes before meal  #30 x 11   Entered by:   Ashok Cordia RN   Authorized by:   Mardella Layman MD [facility]   Signed by:   Ashok Cordia RN on 01/08/2010   Method used:   Electronically to        [facility]  [phone]* (retail)       [address]       [place], [place]  [address]       Ph: [phone] or [phone]       Fax: [phone]   RxID:   [id number]

## 2011-01-16 NOTE — Progress Notes (Signed)
Summary: Labs & Rx  Phone Note Call from Patient Call back at [facility] Phone [phone]   Caller: Patient Call For: [name]  Summary of Call: SHE WAS SCHEDULED TO HAVE LAB WORK IN [place]. SHE DID NOT COME FOR THE LABS.  SHE WOULD LIKE TO COME THIS THURSDAY AND PLEASE VITAMIN D LEVEL TO THE TESTS   WOULD DR Xiomar Crompton GIVE HER AN RX FOR IMITREX FOR HER MIGRAINE HEADACHES SEND TO [facility] ON [address]  Initial call taken by: Roselle Locus,  July 09, 2010 10:24 AM  Follow-up for Phone Call        ok to add vit d level Follow-up by: D. Thomos Lemons DO,  July 09, 2010 1:22 PM  Additional Follow-up for Phone Call Additional follow up Details #1::        she was suppose to have H. Pylori antibody test Additional Follow-up by: D. Thomos Lemons DO,  July 09, 2010 5:09 PM    Additional Follow-up for Phone Call Additional follow up Details #2::    What dx code can I use for the Vitamin D level? Nicki Guadalajara Fergerson CMA Duncan Dull)  July 10, 2010 9:17 AM   use osteopenia code  Additional Follow-up for Phone Call Additional follow up Details #3:: Details for Additional Follow-up Action Taken: Pt notified that rx was sent to pharmacy and order has been faxed to lab for bloodwork tomorrow.  Nicki Guadalajara Fergerson CMA Duncan Dull)  July 10, 2010 12:33 PM   New/Updated Medications: SUMATRIPTAN SUCCINATE 50 MG TABS (SUMATRIPTAN SUCCINATE) one by mouth once daily as needed for severe migraine headache Prescriptions: SUMATRIPTAN SUCCINATE 50 MG TABS (SUMATRIPTAN SUCCINATE) one by mouth once daily as needed for severe migraine headache  #10 x 0   Entered and Authorized by:   D. Thomos Lemons DO   Signed by:   D. Thomos Lemons DO on 07/09/2010   Method used:   Electronically to        [facility]  [phone]* (retail)       [address]       [place], [place]  [address]       Ph: [phone] or [phone]       Fax: [phone]   RxID:   [id number]

## 2011-01-16 NOTE — Assessment & Plan Note (Signed)
Summary: NPX, to be est. - jr   Vital Signs:  Patient profile:   70 year old female Height:      65 inches Weight:      140.50 pounds BMI:     23.46 O2 Sat:      97 % on Room air Temp:     98.1 degrees F oral Pulse rate:   66 / minute Pulse rhythm:   regular Resp:     16 per minute BP sitting:   118 / 66  (right arm) Cuff size:   regular  Vitals Entered By: Glendell Docker CMA (February 05, 2010 1:29 PM)  O2 Flow:  Room air  Contraindications/Deferment of Procedures/Staging:    Test/Procedure: FLU VAX    Reason for deferment: patient declined  CC: RM 2- New Patient  Comments New Patient, Referred by Urbano Heir, establish care, c/o fatigue, discuss Effexor, evaluation of rash on left arm   Primary Care Provider:  Dondra Spry DO  CC:  RM 2- New Patient .  History of Present Illness: 70 y/o white female with hx of dysthymia to establish. she has been on and off effexor for years.   she reports recent death of friend.  took take of elderly gentlemen  she c/o left arm rash.  she likes to walk in woods.  she noticed tick bite in oct.  took doxy x 10 days tick bite in Oct was near buttocks.  later she thought tick bit her on left arm. she was seen by dermatologist.   they did not think rash was related to tick bite  Preventive Screening-Counseling & Management  Alcohol-Tobacco     Alcohol drinks/day: 2     Alcohol type: wine     Smoking Status: quit     Packs/Day: 1.0     Year Started: age 31  Caffeine-Diet-Exercise     Caffeine use/day: 2-3 beverages daily     Does Patient Exercise: yes     Type of exercise: walking     Times/week: 7  Allergies (verified): No Known Drug Allergies  Past History:  Past Medical History: Anxiety Disorder - effexor helps  75 mg  psychiatrist  Dr. Evelene Croon Chronic Headaches - migraines ( good response to metoprolol) Mild Ulcerative Colitis Atypical chest pain - Stress test 01/24/2008    EF 86 %, breast attenuation,  no  significant ischemia Generalized osteoarthritis of hands  Past Surgical History: Hysterectomy partial 1978    Family History: Family History of Breast Cancer: Mother Family History of Colon Cancer: Grandfather ? Family History of Prostate Cancer: Father Family History of Inflammatory Bowel Disease: Sister Family History of Diabetes: Mother Family History of Heart Disease: Mother (died age 33)  dad left family when pt was age 21    Social History: Occupation: Retired (Public affairs consultant business) Married 20 yrs (second marriage) 1 son  (lives in [place]) Patient is a former smoker. -stopped 20+ years ago Alcohol Use - yes-1 glass every few days Daily Caffeine Use-2 cups daily Illicit Drug Use - no  Patient gets regular exercise. Former smoker - Quit 22 yrs ago - smoked on and off 10 yrs (1ppd) Packs/Day:  1.0 Caffeine use/day:  2-3 beverages daily  Review of Systems       she like to take her dogs out for walk in the woods  Physical Exam  General:  alert, well-developed, and well-nourished.   Head:  normocephalic and atraumatic.   Eyes:  pupils equal, pupils round, and  pupils reactive to light.   Ears:  R ear normal and L ear normal.   Mouth:  Oral mucosa and oropharynx without lesions or exudates.  Teeth in good repair. Neck:  No deformities, masses, or tenderness noted.no carotid bruits.   Lungs:  normal respiratory effort and normal breath sounds.   Heart:  normal rate, regular rhythm, no murmur, and no gallop.   Abdomen:  soft, non-tender, normal bowel sounds, no hepatomegaly, and no splenomegaly.   Extremities:  No lower extremity edema  Neurologic:  cranial nerves II-XII intact and gait normal.   Psych:  normally interactive and good eye contact.     Impression & Recommendations:  Problem # 1:  GERD (ICD-530.81) she has chronic GERD.  prev EGD by Dr. Jarold Motto showed esophagitis.  we reviewed anti reflux measures.  avoid caffeine.  avoid chocolate. Her updated  medication list for this problem includes:    Tums 500 Mg Chew (Calcium carbonate antacid) .Marland Kitchen..Marland Kitchen Two to three tablets by mouth daily as needed for heartburn. will take more if needed    Nexium 40 Mg Cpdr (Esomeprazole magnesium) .Marland Kitchen... 1 capsule each day 30 minutes before meal  Problem # 2:  DERMATITIS (ICD-692.9) Intermittent rash on left forearm.  She attributed to poss tick bite.  she was seen by dermatologist who performed biopsy - reported normal. Use topical steroid as directed.  She has hx of GERD.  Check H. Pylori antibody.  Her updated medication list for this problem includes:    Triamcinolone Acetonide 0.1 % Crea (Triamcinolone acetonide) .Marland Kitchen... Apply two times a day to left arm  Problem # 3:  CROHN'S DISEASE (ICD-555.9) she is followed by Dr. Jarold Motto.    Problem # 4:  HEADACHE (ICD-784.0) She has hx of chronic migraines.  symptoms much better since starting B Blockers Her updated medication list for this problem includes:    Metoprolol Succinate 25 Mg Tb24 (Metoprolol succinate) .Marland Kitchen... 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  she tried to taper off in the past by using prozac but experienced withdrawal symptoms.  we discussed possibly switching to pristiq.    Complete Medication List: 1)  Metoprolol Succinate 25 Mg Tb24 (Metoprolol succinate) .... Take 1 tablet by mouth once a day for headaches 2)  Effexor Xr 75 Mg Xr24h-cap (Venlafaxine hcl) .Marland Kitchen.. 1 tablet by mouth once daily 3)  Tums 500 Mg Chew (Calcium carbonate antacid) .... Two to three tablets by mouth daily as needed for heartburn. will take more if needed 4)  Miralax Powd (Polyethylene glycol 3350) .... Take once or twice daily as needed 5)  Nexium 40 Mg Cpdr (Esomeprazole magnesium) .Marland Kitchen.. 1 capsule each day 30 minutes before meal 6)  Multivitamins Tabs (Multiple vitamin) .... Take 1 tablet by mouth once a day 7)  Zostavax 81191 Unt/0.69ml Solr (Zoster  vaccine live) .... Administer vaccine x 1 8)  Triamcinolone Acetonide 0.1 % Crea (Triamcinolone acetonide) .... Apply two times a day to left arm  Patient Instructions: 1)  Please schedule a follow-up appointment in 2 months. 2)  BMP prior to visit, ICD-9:  790.29 3)  Hepatic Panel prior to visit, ICD-9: v70 4)  Lipid Panel prior to visit, ICD-9:v 70 5)  TSH prior to visit, ICD-9: 790.29 6)  HbgA1C prior to visit, ICD-9: 790.29 7)  H. Pylori:  530.81 8)  Please return for lab work within one week. Prescriptions: TRIAMCINOLONE ACETONIDE 0.1 % CREA (TRIAMCINOLONE  ACETONIDE) apply two times a day to left arm  #30 grams x 1   Entered and Authorized by:   D. Thomos Lemons DO   Signed by:   D. Thomos Lemons DO on 02/05/2010   Method used:   Electronically to        [facility]  [phone]* (retail)       [address]       [place], [place]  [address]       Ph: [phone] or [phone]       Fax: [phone]   RxID:   [id number] ZOSTAVAX 19400 UNT/0.65ML SOLR (ZOSTER VACCINE LIVE) administer vaccine x 1  #1 x 0   Entered and Authorized by:   D. Thomos Lemons DO   Signed by:   D. Thomos Lemons DO on 02/05/2010   Method used:   Print then Give to Patient   RxID:   [id number]     Current Allergies (reviewed today): No known allergies    Preventive Care Screening  Last Flu Shot:    Date:  02/05/2010    Results:  Declined   Mammogram:    Date:  06/12/2009    Results:  normal   Pap Smear:    Date:  06/12/2009    Results:  normal   Bone Density:    Date:  06/12/2008    Results:  Osteopenia std dev

## 2011-03-13 ENCOUNTER — Ambulatory Visit: Payer: Self-pay | Admitting: Family

## 2011-03-13 ENCOUNTER — Encounter: Payer: Self-pay | Admitting: Internal Medicine

## 2011-04-22 ENCOUNTER — Telehealth: Payer: Self-pay | Admitting: Internal Medicine

## 2011-04-22 NOTE — Telephone Encounter (Signed)
Opened in error

## 2011-04-22 NOTE — Telephone Encounter (Deleted)
Opened in error

## 2011-05-02 NOTE — Consult Note (Signed)
[name], [name]                        ACCOUNT NO.:  [id number]   MEDICAL RECORD NO.:  [id number]                   PATIENT TYPE:  EMS   LOCATION:  [facility]                                 FACILITY:  [facility]   PHYSICIAN:  Dionne Ano. Everlene Other, M.D.         DATE OF BIRTH:  07-19-1941   DATE OF CONSULTATION:  DATE OF DISCHARGE:                          ORTHOPEDIC SURGERY CONSULTATION   HISTORY OF PRESENT ILLNESS:  I had the pleasure of seeing this patient in  the [facility] Emergency Room upon referral from  emergency room staff.  The patient is a pleasant 70 year old female who is  here today with complaint of right thumb pain worsening x 5 days.  The  patient was in her usual health until five days ago when she began having  severe pain.  She has been on Augmentin for five days given to her at an  urgent care and this has not relieved her symptoms.  Her thumb is swollen,  painful, and difficult to use.  She denies unusual exposure.  The patient  does not work in the [employer].  She notes no inoculation or other trauma  to the thumb.   REVIEW OF SYMPTOMS:  The patient is 70 years old.  She is right-hand  dominant.  She denies other problems on the review of systems.   ALLERGIES:  None.   MEDICATIONS:  1. Augmentin.  2. Bextra.  3. Zomig p.r.n. for headaches.   PAST SURGICAL HISTORY:  1. Hysterectomy.  2. Birthmark removal in her teens.   PAST MEDICAL HISTORY:  Occasional migraines.   SOCIAL HISTORY:  She does not smoke.  She works for [employer].   PHYSICAL EXAMINATION:  GENERAL APPEARANCE:  A white female, alert, oriented,  and in no acute distress.  HEENT:  Normal examination.  ABDOMEN:  Nontender.  CHEST:  Clear.  EXTREMITIES:  The left upper extremity is atraumatic and neurovascularly  intact.  The right upper extremity has a swollen thumb pulp and area about  the radial aspect of her epionychium, which is quite tender.  There is  a  slight bit of fluctuance, but no active discharge.  She has intact capillary  refill.  FPL and EPL are intact.  There are no signs of dystrophic reaction.  Bony architecture is stable to palpation.   ASSESSMENT AND PLAN:  I have examined her length today.  I feel that she  does have a paronychial versus a deep filling process given the location.  Her pulp is rather swollen.  I verbally consented her for I&D.   DESCRIPTION OF PROCEDURE:  The patient was given a lidocaine/Marcaine block  without difficulty about the thumb.  She tolerated this well and then  underwent a Betadine scrub and paint by myself.  Following this, I performed  an incision over the radial aspect of the thumb and immediately encountered  purulence.  The purulent tract was into  the pulp region and about the  paronychial region.  Thus, it appeared that she had some extension of a  paronychial process into her pulp.  I removed a portion of the nail and a  partial nail and plate removal was performed without difficulty utilizing  scissor tip and blunt instrument.  Following this, she was copiously  irrigated.  Cultures were taken during the procedure.  Following copiously  irrigated, she was packed with Iodoform, the tourniquet was deflated, and  she had excellent refill.  The patient did have the tourniquet deflated  prior to irrigation and a large amount of irrigant was placed in the wound I  should note.  Once the wound was packed, it was sterilely dressed after  ensuring excellent refill.  Following sterile dressing, the patient was then  instructed on her DC instructions.   The patient will be discharged home.  I have placed her on Augmentin 875 mg  one p.o. b.i.d. x 10 days.  She will return to the office on Monday for  whirlpool and wound check.  She will elevate and notify use should she have  any problems.  I have given her a prescription for Vicodin and Robaxin to  use for pain and muscle spasm,  respectively.  I have asked her to notify me  immediately should any problems, questions, or concerns arise.  I have gone  over all issues with her length.  All questions have been encouraged and  answered.                                               Dionne Ano. Everlene Other, M.D.    Nash Mantis  D:  07/23/2002  T:  07/27/2002  Job:  [id number]

## 2011-05-08 ENCOUNTER — Telehealth: Payer: Self-pay | Admitting: Internal Medicine

## 2011-05-08 ENCOUNTER — Encounter: Payer: Self-pay | Admitting: Internal Medicine

## 2011-05-08 DIAGNOSIS — E785 Hyperlipidemia, unspecified: Secondary | ICD-10-CM

## 2011-05-08 NOTE — Telephone Encounter (Signed)
PLEASE FAX AN ORDER FOR CPE LABS TO [facility] FOR THE WEEK OF 6-4 AS HAD TO MOVE HER CPE APPT TO WEEK OF June 11 AS DR Pearson Forster

## 2011-05-09 NOTE — Telephone Encounter (Signed)
Lab orders entered for [facility]

## 2011-05-09 NOTE — Telephone Encounter (Signed)
FLP, CRP, BMET - 272.4

## 2011-05-23 LAB — LIPID PANEL
Cholesterol: 217 mg/dL — ABNORMAL HIGH (ref 0–200)
HDL: 69 mg/dL (ref >39)
Total CHOL/HDL Ratio: 3.1 Ratio

## 2011-05-23 LAB — BASIC METABOLIC PANEL
BUN: 15 mg/dL (ref 6–23)
CO2: 28 mEq/L (ref 19–32)
Calcium: 8.9 mg/dL (ref 8.4–10.5)
Creat: 0.61 mg/dL (ref 0.40–1.20)
Glucose, Bld: 105 mg/dL — ABNORMAL HIGH (ref 70–99)

## 2011-05-26 ENCOUNTER — Ambulatory Visit (INDEPENDENT_AMBULATORY_CARE_PROVIDER_SITE_OTHER): Payer: Medicare Other | Admitting: Family Medicine

## 2011-05-26 ENCOUNTER — Encounter: Payer: Self-pay | Admitting: Family Medicine

## 2011-05-26 ENCOUNTER — Encounter: Payer: Self-pay | Admitting: Internal Medicine

## 2011-05-26 DIAGNOSIS — M7580 Other shoulder lesions, unspecified shoulder: Secondary | ICD-10-CM

## 2011-05-26 DIAGNOSIS — M719 Bursopathy, unspecified: Secondary | ICD-10-CM

## 2011-05-26 DIAGNOSIS — J209 Acute bronchitis, unspecified: Secondary | ICD-10-CM | POA: Insufficient documentation

## 2011-05-26 DIAGNOSIS — M67919 Unspecified disorder of synovium and tendon, unspecified shoulder: Secondary | ICD-10-CM

## 2011-05-26 DIAGNOSIS — Z0000 Encounter for general adult medical examination without abnormal findings: Secondary | ICD-10-CM | POA: Insufficient documentation

## 2011-05-26 HISTORY — DX: Other shoulder lesions, unspecified shoulder: M75.80

## 2011-05-26 MED ORDER — METHYLPREDNISOLONE ACETATE 80 MG/ML IJ SUSP: 40.0000 mg | Freq: Once | INTRAMUSCULAR | Status: DC

## 2011-05-26 MED ORDER — ESOMEPRAZOLE MAGNESIUM 40 MG PO CPDR: 40.0000 mg | DELAYED_RELEASE_CAPSULE | Freq: Every day | ORAL | Status: DC

## 2011-05-27 ENCOUNTER — Ambulatory Visit (HOSPITAL_COMMUNITY)
Admission: RE | Admit: 2011-05-27 | Discharge: 2011-05-27 | Disposition: A | Discharge status: Home / Self Care | Payer: Medicare Other | Source: Ambulatory Visit | Attending: Family Medicine | Admitting: Family Medicine

## 2011-05-27 DIAGNOSIS — M25519 Pain in unspecified shoulder: Secondary | ICD-10-CM | POA: Insufficient documentation

## 2011-05-27 DIAGNOSIS — Z87891 Personal history of nicotine dependence: Secondary | ICD-10-CM | POA: Insufficient documentation

## 2011-05-27 DIAGNOSIS — R05 Cough: Secondary | ICD-10-CM | POA: Insufficient documentation

## 2011-05-27 DIAGNOSIS — R059 Cough, unspecified: Secondary | ICD-10-CM | POA: Insufficient documentation

## 2011-05-27 DIAGNOSIS — M542 Cervicalgia: Secondary | ICD-10-CM | POA: Insufficient documentation

## 2011-05-27 NOTE — Assessment & Plan Note (Signed)
Doing well, continue diet/exercise, Ca++ and vit D. She has rx for zostavax and plans on getting this through pharmacy soon. She gets pap/pelvic/mammo through her GYN--all are UTD. Lipids 04/2011 are fine.

## 2011-05-27 NOTE — Assessment & Plan Note (Signed)
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  Will check left shoulder x-ray.  Discussed NSAIDs, relative rest, gentle ROM exercises without resistance and then with resistance after 1 wk or so.

## 2011-05-27 NOTE — Progress Notes (Signed)
OFFICE VISIT  05/27/2011  CC:  Chief Complaint  Patient presents with  . Annual Exam  . Medication Refill    Imitrex     HPI:    Patient is a 70 y.o. Caucasian female who presents for annual exam. We reviewed her recent cholesterol panel and chemistry panel in office today. 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 Jarold Motto.  Complains of 3 wk hx of left shoulder pain, worse with abduction, feels like it pops in/out a bit, has been going to chiropracter for this lately and now upper trapezius area and cervical area on left hurting some.  Asks for left shoulder x-ray. Also asks for chest x-ray, says she's had ongoing cough for 6 wks or so, improved over time but still with feeling of mild fatigue plus has some coughing easily with deep breathing that she does with her yoga.  No SOB, no wheeze.  Ex smoker, quit > 20 yrs ago, no hx of COPD.  No fevers, no wt loss.  Past Medical History  Diagnosis Date  . Arthritis     osteoarthritis, hands  . Anxiety     Effexor helps--psychiatrist Dr Evelene Croon  . Chronic headache   . Mild chronic ulcerative colitis   . Atypical chest pain     EF 86%, breast attenuation, no significant ischemia  . GERD (gastroesophageal reflux disease) 11/2009    EGD: Dr. Jarold Motto: erosive stricture dilated    Past Surgical History  Procedure Date  . Abdominal hysterectomy 1978    partial    Outpatient Prescriptions Prior to Visit  Medication Sig Dispense Refill  . metoprolol succinate (TOPROL-XL) 25 MG 24 hr tablet Take 25 mg by mouth daily. For headaches.       . polyethylene glycol powder (MIRALAX) powder Take 17 g by mouth. Use once or twice a day as needed.       . SUMAtriptan (IMITREX) 50 MG tablet Take 50 mg by mouth daily. For severe headache.       . venlafaxine (EFFEXOR-XR) 75 MG 24 hr capsule Take 75 mg by mouth daily.        . fluticasone (FLONASE) 50  MCG/ACT nasal spray Place 2 sprays into both nostrils daily.       Marland Kitchen azelastine (ASTELIN) 137 MCG/SPRAY nasal spray 2 sprays by Each Nare route 2 (two) times daily. Use in each nostril as directed       . calcium carbonate (TUMS - DOSED IN MG ELEMENTAL CALCIUM) 500 MG chewable tablet Take 2-3 tablets by mouth daily as needed for heartburn. Will take more if needed.       . cetirizine (ZYRTEC) 10 MG tablet Take 10 mg by mouth daily as needed.        . Multiple Vitamin (MULTIVITAMIN) tablet Take 1 tablet by mouth daily.        . ranitidine (ZANTAC) 150 MG tablet Take 150 mg by mouth daily as needed.         No facility-administered medications prior to visit.   History   Social History  . Marital Status: Married    Spouse Name: N/A    Number of Children: N/A  . Years of Education: N/A   Occupational History  . Not on file.   Social History Main Topics  . Smoking status: Former Smoker -- 1.0 packs/day for 10 years    Types: Cigarettes    Quit  date: 12/15/1988  . Smokeless tobacco: Never Used  . Alcohol Use: Yes     1 glass every few days  . Drug Use: No  . Sexually Active: Not on file   Other Topics Concern  . Not on file   Social History Narrative   Regular exercise: yes   Family History  Problem Relation Age of Onset  . Cancer Mother     breast  . Diabetes Mother   . Heart disease Mother   . Cancer Father     prostate  . Inflammatory bowel disease Sister   . Cancer Other     colon     No Known Allergies  ROS Review of Systems  Constitutional: Negative for fever, chills, appetite change and fatigue.  HENT: Negative for ear pain, congestion, sore throat, neck stiffness and dental problem.   Eyes: Negative for discharge, redness and visual disturbance.  Respiratory: Positive for cough. Negative for chest tightness, shortness of breath and wheezing.   Cardiovascular: Negative for chest pain, palpitations and leg swelling.  Gastrointestinal: Negative for nausea,  vomiting, abdominal pain, diarrhea and blood in stool.  Genitourinary: Negative for dysuria, urgency, frequency, hematuria, flank pain and difficulty urinating.  Musculoskeletal: Positive for arthralgias (left shoulder). Negative for myalgias, back pain and joint swelling.  Skin: Negative for pallor and rash.  Neurological: Negative for dizziness, speech difficulty, weakness and headaches.  Hematological: Negative for adenopathy. Does not bruise/bleed easily.  Psychiatric/Behavioral: Negative for confusion and sleep disturbance. The patient is not nervous/anxious.       PE: Blood pressure 112/62, pulse 64, temperature 98.3 F (36.8 C), temperature source Oral, resp. rate 16, height 5' 4.5" (1.638 m), weight 137 lb (62.143 kg), SpO2 96.00%. Gen: Alert, well appearing.  Patient is oriented to person, place, time, and situation. HEENT: Scalp without lesions or hair loss.  Ears: EACs clear, normal epithelium.  TMs with good light reflex and landmarks bilaterally.  Eyes: no injection, icteris, swelling, or exudate.  EOMI, PERRLA. Nose: no drainage or turbinate edema/swelling.  No injection or focal lesion.  Mouth: lips without lesion/swelling.  Oral mucosa pink and moist.  Dentition intact and without obvious caries or gingival swelling.  Oropharynx without erythema, exudate, or swelling.  Neck: supple, ROM full.  Carotids 2+ bilat, without bruit.  No lymphadenopathy, thyromegaly, or mass. Chest: symmetric expansion, nonlabored respirations.  Clear and equal breath sounds in all lung fields.   CV: RRR, no m/r/g.  Peripheral pulses 2+ and symmetric. ABD: soft, NT, ND, BS normal.  No hepatospenomegaly or mass.  No bruits. EXT: no clubbing, cyanosis, or edema.  Neck: ROM fully intact, nontender to palpation.   Left shoulder: mild TTP over deltoid and under acromion.  Pain with ABduction and ADDuction but no weakness, and ROM is still fully intact.  +empty can sign.  Negative drop sign.  Mild increased  laxity of left shoulder GH joint.  Procedure: left shoulder subacromial bursa injection: attempted to inject 1/2 ml of 80mg /ml + 2 cc of 2% lidocaine w/out epi into left subacromial space from posterolateral approach, but met bony resistance with each of two attempts so I did not inject this med into joint space today. Procedure very painful for patient but no immediate complications.  LABS:  Lab Results  Component Value Date   CHOL 217* 05/09/2011   CHOL 222* 02/07/2010   Lab Results  Component Value Date   HDL 69 05/09/2011   HDL 72 0/98/1191   Lab Results  Component  Value Date   LDLCALC 135* 05/09/2011   LDLCALC 139* 02/07/2010   Lab Results  Component Value Date   TRIG 67 05/09/2011   TRIG 54 02/07/2010   Lab Results  Component Value Date   CHOLHDL 3.1 05/09/2011   CHOLHDL 3.1 Ratio 02/07/2010   No results found for this basename: LDLDIRECT     Chemistry      Component Value Date/Time   NA 141 05/09/2011 1656   K 5.1 05/09/2011 1656   CL 105 05/09/2011 1656   CO2 28 05/09/2011 1656   BUN 15 05/09/2011 1656   CREATININE 0.61 05/09/2011 1656   CREATININE 0.70 02/07/2010 2020      Component Value Date/Time   CALCIUM 8.9 05/09/2011 1656   ALKPHOS 73 07/11/2010 2005   AST 24 07/11/2010 2005   ALT 21 07/11/2010 2005   BILITOT 0.7 07/11/2010 2005       IMPRESSION AND PLAN:  Health maintenance examination Doing well, continue diet/exercise, Ca++ and vit D. She has rx for zostavax and plans on getting this through pharmacy soon. She gets pap/pelvic/mammo through her GYN--all are UTD. Lipids 04/2011 are fine.  Acute bronchitis Resolving, but ok for CXR as per pt request.  Rotator cuff tendonitis Unable to successfully do intra-articular injection today due to excess bony resistance, suspect osteoarthritic spurs.  Will check left shoulder x-ray.  Discussed NSAIDs, relative rest, gentle ROM exercises without resistance and then with resistance after 1 wk or so.     FOLLOW UP:  Return if symptoms worsen or fail to improve.

## 2011-05-27 NOTE — Assessment & Plan Note (Signed)
Resolving, but ok for CXR as per pt request.

## 2011-05-29 ENCOUNTER — Telehealth: Payer: Self-pay | Admitting: Family Medicine

## 2011-05-29 NOTE — Telephone Encounter (Signed)
Call returned to patient at [phone], she was advised to ice area twice a day and take benadryl-follow package instructions, if area has not receded by am,she was advised to be seen for evaluation. Patient has verbalized understanding and agrees as instructed

## 2011-05-29 NOTE — Telephone Encounter (Signed)
Noted. I suspect that I inadvertently injected a small amount of depomedrol in her subQ tissue near the skin surface and this could have caused local inflammitory rxn.  --PM

## 2011-05-29 NOTE — Telephone Encounter (Signed)
Call placed to patient at [phone], no answer; voice message was left for patient to return phone call with more detailed information. (fever, sob, how long after injection did symptoms appear)

## 2011-05-29 NOTE — Telephone Encounter (Signed)
Patient returned call, spoke with Myriam Jacobson A, whom she informed the site was very red, and rash appeared the next day, site is still very hot,patient denies shortness of breath, no fever, but, c/o night sweats.  Call was returned to patient at [phone], she was asked about the location of the rash, and states the rash has developed at and around the injection site and has started to spread. She states she is not in any pain, however the site is warm to touch. She denies being in any stress, but would like to know if she needs to worry about this

## 2011-05-29 NOTE — Telephone Encounter (Signed)
She has a rash at the injection site.  Red bumpy that does not itch or hurt.  What should she do.

## 2011-06-22 ENCOUNTER — Encounter (HOSPITAL_BASED_OUTPATIENT_CLINIC_OR_DEPARTMENT_OTHER): Payer: Self-pay | Admitting: Emergency Medicine

## 2011-06-22 ENCOUNTER — Emergency Department (INDEPENDENT_AMBULATORY_CARE_PROVIDER_SITE_OTHER): Payer: Medicare Other

## 2011-06-22 ENCOUNTER — Emergency Department (HOSPITAL_BASED_OUTPATIENT_CLINIC_OR_DEPARTMENT_OTHER)
Admission: EM | Admit: 2011-06-22 | Discharge: 2011-06-22 | Disposition: A | Discharge status: Home / Self Care | Payer: Medicare Other | Attending: Emergency Medicine | Admitting: Emergency Medicine

## 2011-06-22 DIAGNOSIS — K5730 Diverticulosis of large intestine without perforation or abscess without bleeding: Secondary | ICD-10-CM

## 2011-06-22 DIAGNOSIS — Z8739 Personal history of other diseases of the musculoskeletal system and connective tissue: Secondary | ICD-10-CM | POA: Insufficient documentation

## 2011-06-22 DIAGNOSIS — F17200 Nicotine dependence, unspecified, uncomplicated: Secondary | ICD-10-CM | POA: Insufficient documentation

## 2011-06-22 DIAGNOSIS — F411 Generalized anxiety disorder: Secondary | ICD-10-CM | POA: Insufficient documentation

## 2011-06-22 DIAGNOSIS — G8929 Other chronic pain: Secondary | ICD-10-CM | POA: Insufficient documentation

## 2011-06-22 DIAGNOSIS — R1031 Right lower quadrant pain: Secondary | ICD-10-CM

## 2011-06-22 DIAGNOSIS — R51 Headache: Secondary | ICD-10-CM | POA: Insufficient documentation

## 2011-06-22 DIAGNOSIS — R197 Diarrhea, unspecified: Secondary | ICD-10-CM

## 2011-06-22 DIAGNOSIS — N390 Urinary tract infection, site not specified: Secondary | ICD-10-CM | POA: Insufficient documentation

## 2011-06-22 HISTORY — DX: Migraine, unspecified, not intractable, without status migrainosus: G43.909

## 2011-06-22 LAB — DIFFERENTIAL
Basophils Absolute: 0 10*3/uL (ref 0.0–0.1)
Basophils Relative: 0 % (ref 0–1)
Eosinophils Relative: 2 % (ref 0–5)
Monocytes Absolute: 0.6 10*3/uL (ref 0.1–1.0)

## 2011-06-22 LAB — CBC
HCT: 37.5 % (ref 36.0–46.0)
MCHC: 33.9 g/dL (ref 30.0–36.0)
MCV: 91.2 fL (ref 78.0–100.0)
RDW: 13.3 % (ref 11.5–15.5)

## 2011-06-22 LAB — URINALYSIS, ROUTINE W REFLEX MICROSCOPIC
Bilirubin Urine: NEGATIVE
Hgb urine dipstick: NEGATIVE
Protein, ur: NEGATIVE mg/dL
Specific Gravity, Urine: 1.014 (ref 1.005–1.030)
Urobilinogen, UA: 0.2 mg/dL (ref 0.0–1.0)

## 2011-06-22 LAB — URINE MICROSCOPIC-ADD ON

## 2011-06-22 LAB — COMPREHENSIVE METABOLIC PANEL
ALT: 18 U/L (ref 0–35)
AST: 23 U/L (ref 0–37)
CO2: 26 mEq/L (ref 19–32)
Chloride: 103 mEq/L (ref 96–112)
GFR calc non Af Amer: >60 mL/min (ref >60)
Potassium: 4.5 mEq/L (ref 3.5–5.1)
Sodium: 138 mEq/L (ref 135–145)
Total Bilirubin: 0.9 mg/dL (ref 0.3–1.2)

## 2011-06-22 MED ORDER — HYDROMORPHONE HCL 1 MG/ML IJ SOLN
1.0000 mg | Freq: Once | INTRAMUSCULAR | Status: AC
  Administered 2011-06-22: 1 mg via INTRAVENOUS
  Filled 2011-06-22: qty 1

## 2011-06-22 MED ORDER — HYDROCODONE-ACETAMINOPHEN 5-325 MG PO TABS: 2.0000 | ORAL_TABLET | ORAL | Status: AC | PRN

## 2011-06-22 MED ORDER — IOHEXOL 300 MG/ML  SOLN
100.0000 mL | Freq: Once | INTRAMUSCULAR | Status: AC | PRN
  Administered 2011-06-22: 100 mL via INTRAVENOUS

## 2011-06-22 MED ORDER — NITROFURANTOIN MONOHYD MACRO 100 MG PO CAPS: 100.0000 mg | ORAL_CAPSULE | Freq: Two times a day (BID) | ORAL | Status: AC

## 2011-06-22 MED ORDER — ONDANSETRON HCL 4 MG/2ML IJ SOLN
4.0000 mg | Freq: Once | INTRAMUSCULAR | Status: AC
  Administered 2011-06-22: 4 mg via INTRAVENOUS
  Filled 2011-06-22: qty 2

## 2011-06-22 NOTE — ED Provider Notes (Signed)
History     Chief Complaint  Patient presents with  . Abdominal Pain   HPI Comments: Pt complains of abdominal pain.  Pt complains of right lower abdominal pain.  Pt used a laxative yesterday.  Pt reports she has had crohn disease.  This pain is not the same as previous pain.  Patient is a 70 y.o. female presenting with abdominal pain. The history is provided by the patient.  Abdominal Pain The primary symptoms of the illness include abdominal pain, nausea and diarrhea. The primary symptoms of the illness do not include dysuria. The current episode started more than 2 days ago. The onset of the illness was gradual. The problem has been gradually worsening.  The patient states that she believes she is currently not pregnant. The patient has had a change in bowel habit. Additional symptoms associated with the illness include back pain. Significant associated medical issues include inflammatory bowel disease.    Past Medical History  Diagnosis Date  . Arthritis     osteoarthritis, hands  . Anxiety     Effexor helps--psychiatrist Dr Evelene Croon  . Chronic headache   . Mild chronic ulcerative colitis   . Atypical chest pain     EF 86%, breast attenuation, no significant ischemia  . GERD (gastroesophageal reflux disease) 11/2009    EGD: Dr. Jarold Motto: erosive stricture dilated  . Anxiety   . Migraines     Past Surgical History  Procedure Date  . Abdominal hysterectomy 1978    partial    Family History  Problem Relation Age of Onset  . Cancer Mother     breast  . Diabetes Mother   . Heart disease Mother   . Cancer Father     prostate  . Inflammatory bowel disease Sister   . Cancer Other     colon    History  Substance Use Topics  . Smoking status: Current Everyday Smoker -- 1.0 packs/day for 10 years    Types: Cigarettes    Last Attempt to Quit: 12/15/1988  . Smokeless tobacco: Never Used  . Alcohol Use: Yes     1 glass every few days    OB History    Grav Para Term  Preterm Abortions TAB SAB Ect Mult Living                  Review of Systems  Cardiovascular: Negative for chest pain.  Gastrointestinal: Positive for nausea, abdominal pain and diarrhea.  Genitourinary: Negative for dysuria and difficulty urinating.  Musculoskeletal: Positive for back pain.  All other systems reviewed and are negative.    Physical Exam  BP 143/72  Pulse 63  Temp(Src) 98.6 F (37 C) (Oral)  Resp 18  Ht 5\' 5"  (1.651 m)  Wt 133 lb 8 oz (60.555 kg)  BMI 22.22 kg/m2  SpO2 96%  Physical Exam  Constitutional: She appears well-developed and well-nourished.  HENT:  Head: Normocephalic.  Eyes: Conjunctivae and EOM are normal. Pupils are equal, round, and reactive to light.  Neck: Normal range of motion.  Cardiovascular: Normal rate, regular rhythm and normal heart sounds.   Pulmonary/Chest: Effort normal and breath sounds normal.  Abdominal: Soft. There is tenderness.  Musculoskeletal: Normal range of motion.  Neurological: She is alert.  Skin: Skin is warm and dry.  Psychiatric: She has a normal mood and affect.    ED Course  Procedures  MDM Ct scan shows no evidence of appendicitis,  No diverticulitis,  Urine does show infection,  Langston Masker, [place] 06/22/11 1547

## 2011-08-04 ENCOUNTER — Ambulatory Visit (INDEPENDENT_AMBULATORY_CARE_PROVIDER_SITE_OTHER): Payer: Medicare Other | Admitting: Internal Medicine

## 2011-08-04 ENCOUNTER — Encounter: Payer: Self-pay | Admitting: Internal Medicine

## 2011-08-04 VITALS — BP 110/70 | HR 67 | Temp 98.4°F | Resp 18 | Wt 133.0 lb

## 2011-08-04 DIAGNOSIS — R109 Unspecified abdominal pain: Secondary | ICD-10-CM

## 2011-08-04 LAB — POCT URINALYSIS DIPSTICK
Ketones, UA: NEGATIVE
Leukocytes, UA: NEGATIVE
Nitrite, UA: NEGATIVE

## 2011-08-04 MED ORDER — CIPROFLOXACIN HCL 500 MG PO TABS: 500.0000 mg | ORAL_TABLET | Freq: Two times a day (BID) | ORAL | Status: AC

## 2011-08-04 NOTE — Assessment & Plan Note (Signed)
UA essentially unremarkable but presentation identical to previuos likely uti. Recent ct unremarkable. Obtain urine cx. Begin 7d course of cipro. Followup if no improvement or worsening.

## 2011-08-04 NOTE — Progress Notes (Signed)
  Subjective:    Patient ID: [name], female    DOB: 1941/03/11, 70 y.o.   MRN: [id number]  HPI Pt presents to clinic as a work in for evaluation of  Abdominal pain. Notes 3 d h/o RLQ pain that radiates to inguinal area. No f/c, nv. +associated urinary frequency and urgency. Reviewed recent ED evaluation for similar sx's 7/8. CT of abd unremarkable and UA suggestive of infxn. Tx'ed with 7d course of macrobid with resolution of sx's over a week. No exacerbating or alleviating factors. UA obtained and reviewed with tr protein. No other complaints.    Past Medical History  Diagnosis Date  . Arthritis     osteoarthritis, hands  . Anxiety     Effexor helps--psychiatrist Dr Evelene Croon  . Chronic headache   . Mild chronic ulcerative colitis   . Atypical chest pain     EF 86%, breast attenuation, no significant ischemia  . GERD (gastroesophageal reflux disease) 11/2009    EGD: Dr. Jarold Motto: erosive stricture dilated  . Anxiety   . Migraines    Past Surgical History  Procedure Date  . Abdominal hysterectomy 1978    partial    reports that she quit smoking about 22 years ago. Her smoking use included Cigarettes. She has a 10 pack-year smoking history. She has never used smokeless tobacco. She reports that she drinks alcohol. She reports that she does not use illicit drugs. family history includes Cancer in her father, mother, and other; Diabetes in her mother; Heart disease in her mother; and Inflammatory bowel disease in her sister. No Known Allergies   Review of Systems see hpi     Objective:   Physical Exam  Nursing note and vitals reviewed. Constitutional: She appears well-developed and well-nourished. No distress.  HENT:  Head: Normocephalic and atraumatic.  Right Ear: External ear normal.  Left Ear: External ear normal.  Eyes: Conjunctivae are normal.  Abdominal: Soft. Normal appearance and bowel sounds are normal. She exhibits no distension and no mass. There is tenderness  in the right lower quadrant. There is no rigidity, no rebound and no guarding.  Musculoskeletal:       No cva tenderness   Neurological: She is alert.  Skin: Skin is warm and dry. She is not diaphoretic.  Psychiatric: She has a normal mood and affect.          Assessment & Plan:

## 2011-08-07 LAB — URINE CULTURE: Colony Count: NO GROWTH

## 2011-08-30 ENCOUNTER — Emergency Department (HOSPITAL_COMMUNITY): Payer: Medicare Other

## 2011-08-30 ENCOUNTER — Emergency Department (HOSPITAL_COMMUNITY)
Admission: EM | Admit: 2011-08-30 | Discharge: 2011-08-30 | Disposition: A | Discharge status: Home / Self Care | Payer: Medicare Other | Attending: Emergency Medicine | Admitting: Emergency Medicine

## 2011-08-30 DIAGNOSIS — M719 Bursopathy, unspecified: Secondary | ICD-10-CM | POA: Insufficient documentation

## 2011-08-30 DIAGNOSIS — M25519 Pain in unspecified shoulder: Secondary | ICD-10-CM | POA: Insufficient documentation

## 2011-08-30 DIAGNOSIS — M67919 Unspecified disorder of synovium and tendon, unspecified shoulder: Secondary | ICD-10-CM | POA: Insufficient documentation

## 2011-09-15 ENCOUNTER — Other Ambulatory Visit: Payer: Self-pay | Admitting: Obstetrics and Gynecology

## 2011-09-17 ENCOUNTER — Other Ambulatory Visit: Payer: Self-pay | Admitting: Obstetrics and Gynecology

## 2011-09-17 DIAGNOSIS — Z1231 Encounter for screening mammogram for malignant neoplasm of breast: Secondary | ICD-10-CM

## 2011-09-30 ENCOUNTER — Telehealth: Payer: Self-pay | Admitting: *Deleted

## 2011-09-30 NOTE — Telephone Encounter (Signed)
Patient called and left voice message requesting a return call regarding a Depo Medrol shot she received. He message stated that she has not been feeling great.

## 2011-10-01 NOTE — Telephone Encounter (Signed)
Call placed to patient at [phone], she stated she was concerned about the injection that she received and was unclear on whether she was affected by it.  She was informed I was unclear on what injection she was referring to, and I was not sure if she was affected by what is being talked about on the news.  She was asked if she was having any symptoms that needed to be addressed, and she did not provide a response to the question, however she stated that she will contact Dr Artist Pais at [place] office regarding her concerns.

## 2011-10-06 ENCOUNTER — Ambulatory Visit
Admission: RE | Admit: 2011-10-06 | Discharge: 2011-10-06 | Disposition: A | Discharge status: Home / Self Care | Payer: Medicare Other | Source: Ambulatory Visit | Attending: Obstetrics and Gynecology | Admitting: Obstetrics and Gynecology

## 2011-10-06 DIAGNOSIS — Z1231 Encounter for screening mammogram for malignant neoplasm of breast: Secondary | ICD-10-CM

## 2012-01-19 ENCOUNTER — Encounter: Payer: Self-pay | Admitting: Gynecology

## 2012-01-19 ENCOUNTER — Other Ambulatory Visit: Payer: Self-pay | Admitting: Gynecology

## 2012-01-19 ENCOUNTER — Other Ambulatory Visit (HOSPITAL_COMMUNITY)
Admission: RE | Admit: 2012-01-19 | Discharge: 2012-01-19 | Disposition: A | Discharge status: Home / Self Care | Payer: Medicare Other | Source: Ambulatory Visit | Attending: Gynecology | Admitting: Gynecology

## 2012-01-19 ENCOUNTER — Ambulatory Visit (INDEPENDENT_AMBULATORY_CARE_PROVIDER_SITE_OTHER): Payer: Medicare Other | Admitting: Gynecology

## 2012-01-19 VITALS — BP 122/72 | Ht 65.0 in | Wt 140.0 lb

## 2012-01-19 DIAGNOSIS — M8580 Other specified disorders of bone density and structure, unspecified site: Secondary | ICD-10-CM

## 2012-01-19 DIAGNOSIS — D072 Carcinoma in situ of vagina: Secondary | ICD-10-CM

## 2012-01-19 DIAGNOSIS — Z124 Encounter for screening for malignant neoplasm of cervix: Secondary | ICD-10-CM | POA: Insufficient documentation

## 2012-01-19 DIAGNOSIS — M949 Disorder of cartilage, unspecified: Secondary | ICD-10-CM

## 2012-01-19 DIAGNOSIS — N952 Postmenopausal atrophic vaginitis: Secondary | ICD-10-CM

## 2012-01-19 DIAGNOSIS — R350 Frequency of micturition: Secondary | ICD-10-CM

## 2012-01-19 LAB — URINALYSIS W MICROSCOPIC + REFLEX CULTURE
Bilirubin Urine: NEGATIVE
Glucose, UA: NEGATIVE mg/dL
Hgb urine dipstick: NEGATIVE
Ketones, ur: NEGATIVE mg/dL
Leukocytes, UA: NEGATIVE
Nitrite: NEGATIVE
Protein, ur: NEGATIVE mg/dL
Specific Gravity, Urine: 1.015 (ref 1.005–1.030)
Urobilinogen, UA: 0.2 mg/dL (ref 0.0–1.0)
pH: 7 (ref 5.0–8.0)

## 2012-01-19 NOTE — Patient Instructions (Signed)
Follow up for bone density study. If constipation continues follow up with Dr. Jarold Motto. Follow up for annual gynecologic exam in one year and Pap smear.

## 2012-01-19 NOTE — Progress Notes (Signed)
KALIANNE FETTING [birth year] [id number]        71 y.o.  New patient for follow up. Several issues noted below.  Past medical history,surgical history, medications, allergies, family history and social history were all reviewed and documented in the EPIC chart. ROS:  Was performed and pertinent positives and negatives are included in the history.  Exam: Sherrilyn Rist chaperone present Filed Vitals:   01/19/12 1424  BP: 122/72   General appearance  Normal Skin grossly normal Head/Neck normal with no cervical or supraclavicular adenopathy thyroid normal Lungs  clear Cardiac RR, without RMG Abdominal  soft, nontender, without masses, organomegaly or hernia Breasts  examined lying and sitting without masses, retractions, discharge or axillary adenopathy.  Bilateral implants noted. Pelvic  Ext/BUS/vagina  normal with mild atrophic changes.  Pap of cuff done.  Adnexa  Without masses or tenderness    Anus and perineum  normal   Rectovaginal  normal sphincter tone without palpated masses or tenderness.    Assessment/Plan:  71 y.o. female for annual exam.    1. 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 Pap of cuff done today. We'll continue annual Pap smears. 2. Complaints of constipation for one month. She is using some fiber. We discussed several other options of OTC medications. If this continues she'll follow up with Dr. Jarold Motto who is her gastroenterologist. 3. Frequency. Patient has some frequency although said that she thinks is due to her constipation. No urgency dysuria. UA today is negative. We'll monitor present until her constipation resolves. 4. Vaginal dryness. Patients using a OTC vaginal moisturizer. We discussed options up to and including ERT both systemic and vaginal such as Vagifem. The risks benefits to include the WHI study stroke heart attack DVT possible breast cancer risks reviewed. Patient's comfortable with continuing with  the vaginal moisturizers. 5. Osteopenia.  DEXA September 2010 shows T score -2.3 at the AP spine. Report is in Epic. We'll repeat DEXA now she'll follow up for this and we will go from there. Maximizing calcium and vitamin D were reviewed. 6. Breast health. Bilateral implants noted. Mammography October 2012 normal. We'll continue with annual mammography.   SBE monthly reviewed. 7. Colon Health. She's up-to-date with colonoscopy and we'll continue to follow up with Dr. Jarold Motto reference to this. 8. Health maintenance. No blood work was done today as it was all done through her primary physician's office.   Dara Lords MD, 2:47 PM 01/19/2012

## 2012-01-20 ENCOUNTER — Encounter: Payer: Self-pay | Admitting: Gynecology

## 2012-01-21 LAB — URINE CULTURE: Colony Count: 55000

## 2012-03-09 ENCOUNTER — Ambulatory Visit (INDEPENDENT_AMBULATORY_CARE_PROVIDER_SITE_OTHER): Payer: Medicare Other

## 2012-03-09 DIAGNOSIS — M810 Age-related osteoporosis without current pathological fracture: Secondary | ICD-10-CM

## 2012-03-09 DIAGNOSIS — M8580 Other specified disorders of bone density and structure, unspecified site: Secondary | ICD-10-CM

## 2012-03-10 ENCOUNTER — Telehealth: Payer: Self-pay | Admitting: Gynecology

## 2012-03-10 ENCOUNTER — Encounter: Payer: Self-pay | Admitting: Gynecology

## 2012-03-10 NOTE — Telephone Encounter (Signed)
Tell patient that her bone density shows osteoporosis and I want her to make an office appointment to discuss treatment options.

## 2012-03-10 NOTE — Telephone Encounter (Signed)
Left message for pt to call.

## 2012-03-11 NOTE — Telephone Encounter (Signed)
Patient informed.  appts to set up. 

## 2012-03-19 ENCOUNTER — Ambulatory Visit (INDEPENDENT_AMBULATORY_CARE_PROVIDER_SITE_OTHER): Payer: Medicare Other | Admitting: Gynecology

## 2012-03-19 ENCOUNTER — Encounter: Payer: Self-pay | Admitting: Gynecology

## 2012-03-19 DIAGNOSIS — M810 Age-related osteoporosis without current pathological fracture: Secondary | ICD-10-CM

## 2012-03-19 MED ORDER — ALENDRONATE SODIUM 70 MG PO TABS: 70.0000 mg | ORAL_TABLET | ORAL | Status: DC

## 2012-03-19 NOTE — Patient Instructions (Signed)
Start alendronate as we discussed.

## 2012-03-19 NOTE — Progress Notes (Signed)
Patient presents to discuss her bone density which shows a T score of -2.5 at her lumbar spine. She was -2.3 on a previous study. She does take extra calcium and vitamin D. Patient I had a lengthy discussion as to the risks/benefits, pros/condoms for treatment versus observation. The various options for treatment to include oral bisphosphonates, IV bisphosphonates, Prolia, Forteo, Evista, calcitonin, HRT were all discussed. I reviewed the side effect profile with bisphosphonates, how to take them in the risks to include reflux esophagitis, long-term with esophageal cancer, osteonecrosis of the jaw, atypical fractures particularly with prolonged use was all discussed. She wants to go ahead and start this I prescribed alendronate 70 mg weekly x1 year. I recommend repeat DEXA in a 2 year interval.

## 2012-04-01 ENCOUNTER — Ambulatory Visit: Payer: Medicare Other | Admitting: Family

## 2012-08-18 ENCOUNTER — Telehealth: Payer: Self-pay | Admitting: *Deleted

## 2012-08-18 MED ORDER — FLUTICASONE PROPIONATE 50 MCG/ACT NA SUSP: 2.0000 | Freq: Every day | NASAL | Status: DC

## 2012-08-18 MED ORDER — AZELASTINE HCL 0.1 % NA SOLN: 2.0000 | Freq: Two times a day (BID) | NASAL | Status: DC

## 2012-08-18 NOTE — Telephone Encounter (Signed)
OK to send 1 month supply of each. Pt is due for a follow up apt though as she has not been seen in >1 year.

## 2012-08-18 NOTE — Telephone Encounter (Signed)
Refills sent. Attempted to reach pt and left message to return my call.

## 2012-08-18 NOTE — Telephone Encounter (Signed)
Received message from pt stating her allergies are starting to flare up again. She is requesting refills of Azelastine and Fluticasone nasal sprays to [facility] on [address] as she reports these work the best for her allergies.  Please advise.

## 2012-08-19 NOTE — Telephone Encounter (Signed)
Notified pt no need to return for labs prior to wellness exam but needs to be fasting for appt. Pt voices understanding.

## 2012-08-19 NOTE — Telephone Encounter (Signed)
Notified pt and scheduled a medicare wellness exam for 09/10/12 at 11:15am.  Please advise what labs pt should have prior to appt?

## 2012-08-19 NOTE — Telephone Encounter (Signed)
None. Need to discuss with her. Labs are not directly part of wellness visit

## 2012-09-10 ENCOUNTER — Ambulatory Visit (INDEPENDENT_AMBULATORY_CARE_PROVIDER_SITE_OTHER): Payer: Medicare Other | Admitting: Internal Medicine

## 2012-09-10 ENCOUNTER — Encounter: Payer: Self-pay | Admitting: Internal Medicine

## 2012-09-10 VITALS — BP 112/66 | HR 67 | Temp 98.5°F | Resp 14 | Ht 65.0 in | Wt 132.0 lb

## 2012-09-10 DIAGNOSIS — Z0000 Encounter for general adult medical examination without abnormal findings: Secondary | ICD-10-CM

## 2012-09-10 DIAGNOSIS — R739 Hyperglycemia, unspecified: Secondary | ICD-10-CM

## 2012-09-10 DIAGNOSIS — Z23 Encounter for immunization: Secondary | ICD-10-CM

## 2012-09-10 DIAGNOSIS — E785 Hyperlipidemia, unspecified: Secondary | ICD-10-CM

## 2012-09-10 LAB — BASIC METABOLIC PANEL WITH GFR
BUN: 13 mg/dL (ref 6–23)
CO2: 29 meq/L (ref 19–32)
Calcium: 9.2 mg/dL (ref 8.4–10.5)
Chloride: 101 meq/L (ref 96–112)
Creat: 0.71 mg/dL (ref 0.50–1.10)
Glucose, Bld: 98 mg/dL (ref 70–99)
Potassium: 4.9 meq/L (ref 3.5–5.3)
Sodium: 136 meq/L (ref 135–145)

## 2012-09-10 LAB — HEMOGLOBIN A1C
Hgb A1c MFr Bld: 5.8 % — ABNORMAL HIGH (ref <5.7)
Mean Plasma Glucose: 120 mg/dL — ABNORMAL HIGH (ref <117)

## 2012-09-10 LAB — HEPATIC FUNCTION PANEL
Albumin: 4.4 g/dL (ref 3.5–5.2)
Total Protein: 6.9 g/dL (ref 6.0–8.3)

## 2012-09-10 MED ORDER — SUMATRIPTAN SUCCINATE 50 MG PO TABS: 50.0000 mg | ORAL_TABLET | Freq: Every day | ORAL | Status: DC

## 2012-09-10 NOTE — Patient Instructions (Signed)
Please schedule fasting labs prior to next visit Lipid-272.4 

## 2012-09-12 NOTE — Progress Notes (Signed)
  Subjective:    Patient ID: [name], female    DOB: 1941-12-12, 71 y.o.   MRN: [id number]  HPI patient presents to clinic for annual wellness visit. Completed depression screen with patient-history of depression currently well maintained on medication. Completed functional assessment without evidence or risk for fall. Vital signs weight and body mass index reviewed. Immunizations and preventive care reviewed made aware of issue of advanced directives and states understanding. Reviewed outside cholesterol that in May 2013. Suffers from rhinitis symptoms but does not take nasal spray on regular basis. Up-to-date with GYN.  Past Medical History  Diagnosis Date  . Arthritis     osteoarthritis, hands  . Anxiety     Effexor helps--psychiatrist Dr Evelene Croon  . Chronic headache   . Mild chronic ulcerative colitis   . Atypical chest pain     EF 86%, breast attenuation, no significant ischemia  . GERD (gastroesophageal reflux disease) 11/2009    EGD: Dr. Jarold Motto: erosive stricture dilated  . Anxiety   . Migraines   . 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 Osteoporosis 02/2012    t score -2.5 spine   Past Surgical History  Procedure Date  . Laser vaporization of upper vagina 1998  . Vaginal hysterectomy 1978    reports that she quit smoking about 23 years ago. Her smoking use included Cigarettes. She has a 10 pack-year smoking history. She has never used smokeless tobacco. She reports that she drinks alcohol. She reports that she does not use illicit drugs. family history includes Cancer in her father, mother, and paternal grandfather; Diabetes in her maternal aunt; Heart disease in her mother; Heart failure in her mother; and Inflammatory bowel disease in her sister. No Known Allergies   Review of Systems see hpi     Objective:   Physical Exam  Nursing note and vitals reviewed. Constitutional: She appears well-nourished. No distress.  HENT:  Head:  Normocephalic and atraumatic.  Right Ear: External ear normal.  Left Ear: External ear normal.  Nose: Nose normal.  Mouth/Throat: Oropharynx is clear and moist. No oropharyngeal exudate.  Eyes: Conjunctivae normal and EOM are normal. Pupils are equal, round, and reactive to light. No scleral icterus.  Neck: Neck supple. No thyromegaly present.  Cardiovascular: Normal rate, regular rhythm and normal heart sounds.  Exam reveals no gallop and no friction rub.   No murmur heard. Pulmonary/Chest: Effort normal and breath sounds normal. No respiratory distress. She has no wheezes. She has no rales.  Abdominal: Soft. Bowel sounds are normal. She exhibits no distension. There is no tenderness. There is no rebound.  Lymphadenopathy:    She has no cervical adenopathy.  Skin: Skin is warm. She is not diaphoretic.  Psychiatric: She has a normal mood and affect.          Assessment & Plan:

## 2012-09-12 NOTE — Assessment & Plan Note (Signed)
Normal exam. Influenza vaccine provided.

## 2012-10-26 ENCOUNTER — Other Ambulatory Visit: Payer: Self-pay | Admitting: Gynecology

## 2012-10-26 DIAGNOSIS — Z1231 Encounter for screening mammogram for malignant neoplasm of breast: Secondary | ICD-10-CM

## 2012-11-01 ENCOUNTER — Telehealth: Payer: Self-pay | Admitting: Internal Medicine

## 2012-11-01 NOTE — Telephone Encounter (Signed)
Called pt and schd an ov to re-est with Dr Artist Pais on 11/02/12 at 1:30pm as noted.

## 2012-11-01 NOTE — Telephone Encounter (Signed)
Ok with me 

## 2012-11-01 NOTE — Telephone Encounter (Signed)
Of course!

## 2012-11-01 NOTE — Telephone Encounter (Signed)
Pt called req to change pcp from Dr Rodena Medin to Dr Artist Pais, due to location. Pls advise if ok?

## 2012-11-02 ENCOUNTER — Encounter: Payer: Self-pay | Admitting: Internal Medicine

## 2012-11-02 ENCOUNTER — Ambulatory Visit (INDEPENDENT_AMBULATORY_CARE_PROVIDER_SITE_OTHER): Payer: Medicare Other | Admitting: Internal Medicine

## 2012-11-02 VITALS — BP 122/74 | HR 64 | Temp 98.0°F | Ht 65.0 in | Wt 129.0 lb

## 2012-11-02 DIAGNOSIS — J309 Allergic rhinitis, unspecified: Secondary | ICD-10-CM

## 2012-11-02 MED ORDER — FLUTICASONE PROPIONATE 50 MCG/ACT NA SUSP: 2.0000 | Freq: Every day | NASAL | Status: DC | PRN

## 2012-11-02 NOTE — Patient Instructions (Signed)
Use fexofenadine 180 mg once daily

## 2012-11-02 NOTE — Assessment & Plan Note (Signed)
Patient experiencing exacerbation of allergic rhinitis. Patient advised to use fexofenadine 180 mg once daily. Over-the-counter Claritin caused headaches. Patient also encouraged to use Flonase 2 sprays each nostril once daily for at least 1 to 2 months. Check regional allergy panel.

## 2012-11-02 NOTE — Progress Notes (Signed)
Subjective:    Patient ID: [name], female    DOB: 1941/08/27, 71 y.o.   MRN: [id number]  HPI  70 year old white female with history of allergic rhinitis complains of recent exacerbation. Patient intermittently uses her Flonase and Astelin nose rate. Patient complains that her nose runs at work, at home, and it is socially embarrassing.  She denies headaches or facial pain.  Review of Systems Negative for fever or chills  Past Medical History  Diagnosis Date  . Arthritis     osteoarthritis, hands  . Anxiety     Effexor helps--psychiatrist Dr Evelene Croon  . Chronic headache   . Mild chronic ulcerative colitis   . Atypical chest pain     EF 86%, breast attenuation, no significant ischemia  . GERD (gastroesophageal reflux disease) 11/2009    EGD: Dr. Jarold Motto: erosive stricture dilated  . Anxiety   . Migraines   . 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 Osteoporosis 02/2012    t score -2.5 spine    History   Social History  . Marital Status: Married    Spouse Name: N/A    Number of Children: N/A  . Years of Education: N/A   Occupational History  . Not on file.   Social History Main Topics  . Smoking status: Former Smoker -- 1.0 packs/day for 10 years    Types: Cigarettes    Quit date: 12/15/1988  . Smokeless tobacco: Never Used     Comment: quit in the 1980's  . Alcohol Use: Yes     Comment: 1 glass every few days  . Drug Use: No  . Sexually Active: No   Other Topics Concern  . Not on file   Social History Narrative   Regular exercise: yes    Past Surgical History  Procedure Date  . Laser vaporization of upper vagina 1998  . Vaginal hysterectomy 1978    Family History  Problem Relation Age of Onset  . Cancer Mother     breast  . Heart disease Mother   . Heart failure Mother   . Cancer Father     prostate  . Inflammatory bowel disease Sister   . Diabetes Maternal Aunt   . Cancer Paternal Grandfather     colon     No Known Allergies  Current Outpatient Prescriptions on File Prior to Visit  Medication Sig Dispense Refill  . azelastine (ASTELIN) 137 MCG/SPRAY nasal spray Place 2 sprays into the nose 2 (two) times daily as needed. Use in each nostril as directed      . esomeprazole (NEXIUM) 20 MG capsule Take 20 mg by mouth daily as needed.       . metoprolol succinate (TOPROL-XL) 25 MG 24 hr tablet Take 25 mg by mouth 2 (two) times daily. For headaches.      . SUMAtriptan (IMITREX) 50 MG tablet Take 1 tablet (50 mg total) by mouth daily. For severe headache.  10 tablet  3  . venlafaxine (EFFEXOR-XR) 75 MG 24 hr capsule Take 75 mg by mouth daily.        . [DISCONTINUED] fluticasone (FLONASE) 50 MCG/ACT nasal spray Place 2 sprays into the nose daily as needed.        BP 122/74  Pulse 64  Temp 98 F (36.7 C) (Oral)  Ht 5\' 5"  (1.651 m)  Wt 129 lb (58.514 kg)  BMI 21.47 kg/m2       Objective:   Physical Exam  Constitutional: She  is oriented to person, place, and time. She appears well-developed and well-nourished.  HENT:  Head: Normocephalic and atraumatic.  Right Ear: External ear normal.  Left Ear: External ear normal.  Mouth/Throat: Oropharynx is clear and moist.       Pale nasal mucosa  Cardiovascular: Normal rate, regular rhythm and normal heart sounds.   Pulmonary/Chest: Effort normal and breath sounds normal. She has no wheezes.  Neurological: She is alert and oriented to person, place, and time.  Psychiatric: She has a normal mood and affect. Her behavior is normal.          Assessment & Plan:

## 2012-11-03 LAB — ~~LOC~~ ALLERGY PANEL
Allergen, Comm Silver Birch, t9: <0.1 kU/L
Allergen, D pternoyssinus,d7: 0.45 kU/L — ABNORMAL HIGH
Allergen, Mulberry, t76: <0.1 kU/l
Alternaria Alternata: <0.1 kU/L
Cat Dander: <0.1 kU/L
Cladosporium Herbarum: <0.1 kU/L
D. farinae: 0.35 kU/L — ABNORMAL HIGH
Johnson Grass: <0.1 kU/L
Mucor Racemosus: <0.1 kU/L
Mugwort: <0.1 kU/L
Oak: <0.1 kU/L
Penicillium Notatum: <0.1 kU/L
Plantain: <0.1 kU/L

## 2012-12-03 ENCOUNTER — Ambulatory Visit (INDEPENDENT_AMBULATORY_CARE_PROVIDER_SITE_OTHER): Payer: Medicare Other | Admitting: Internal Medicine

## 2012-12-03 ENCOUNTER — Encounter: Payer: Self-pay | Admitting: Internal Medicine

## 2012-12-03 VITALS — BP 104/64 | Temp 98.0°F | Wt 132.0 lb

## 2012-12-03 DIAGNOSIS — B353 Tinea pedis: Secondary | ICD-10-CM

## 2012-12-03 DIAGNOSIS — J309 Allergic rhinitis, unspecified: Secondary | ICD-10-CM

## 2012-12-03 DIAGNOSIS — L570 Actinic keratosis: Secondary | ICD-10-CM

## 2012-12-03 HISTORY — DX: Actinic keratosis: L57.0

## 2012-12-03 MED ORDER — CLOTRIMAZOLE-BETAMETHASONE 1-0.05 % EX CREA: TOPICAL_CREAM | Freq: Two times a day (BID) | CUTANEOUS | Status: DC

## 2012-12-03 MED ORDER — ESOMEPRAZOLE MAGNESIUM 40 MG PO CPDR: 40.0000 mg | DELAYED_RELEASE_CAPSULE | Freq: Every day | ORAL | Status: DC

## 2012-12-03 MED ORDER — AZELASTINE HCL 0.1 % NA SOLN: 2.0000 | Freq: Two times a day (BID) | NASAL | Status: DC | PRN

## 2012-12-03 NOTE — Progress Notes (Signed)
Subjective:    Patient ID: [name], female    DOB: November 09, 1941, 71 y.o.   MRN: [id number]  HPI  71 year old white female for followup regarding allergic rhinitis. Patient reports her symptoms moderately improved since using intranasal steroids and Astelin nose spray. Allergy panel showed sensitivity to dust mites. She has taken environmental measures to reduce dust mite exposure.  Patient complains of rash bottom of her right foot.  It is pruritic at times and appears scaly with mild redness.  Patient also concerned about small skin lesion on chest.  Review of Systems Negative for fever  Past Medical History  Diagnosis Date  . Arthritis     osteoarthritis, hands  . Anxiety     Effexor helps--psychiatrist Dr Evelene Croon  . Chronic headache   . Mild chronic ulcerative colitis   . Atypical chest pain     EF 86%, breast attenuation, no significant ischemia  . GERD (gastroesophageal reflux disease) 11/2009    EGD: Dr. Jarold Motto: erosive stricture dilated  . Anxiety   . Migraines   . 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 Osteoporosis 02/2012    t score -2.5 spine    History   Social History  . Marital Status: Married    Spouse Name: N/A    Number of Children: N/A  . Years of Education: N/A   Occupational History  . Not on file.   Social History Main Topics  . Smoking status: Former Smoker -- 1.0 packs/day for 10 years    Types: Cigarettes    Quit date: 12/15/1988  . Smokeless tobacco: Never Used     Comment: quit in the 1980's  . Alcohol Use: Yes     Comment: 1 glass every few days  . Drug Use: No  . Sexually Active: No   Other Topics Concern  . Not on file   Social History Narrative   Regular exercise: yes    Past Surgical History  Procedure Date  . Laser vaporization of upper vagina 1998  . Vaginal hysterectomy 1978    Family History  Problem Relation Age of Onset  . Cancer Mother     breast  . Heart disease Mother   .  Heart failure Mother   . Cancer Father     prostate  . Inflammatory bowel disease Sister   . Diabetes Maternal Aunt   . Cancer Paternal Grandfather     colon    No Known Allergies  Current Outpatient Prescriptions on File Prior to Visit  Medication Sig Dispense Refill  . esomeprazole (NEXIUM) 40 MG capsule Take 1 capsule (40 mg total) by mouth daily before breakfast.  90 capsule  1  . metoprolol succinate (TOPROL-XL) 25 MG 24 hr tablet Take 25 mg by mouth 2 (two) times daily. For headaches.      . SUMAtriptan (IMITREX) 50 MG tablet Take 1 tablet (50 mg total) by mouth daily. For severe headache.  10 tablet  3  . venlafaxine (EFFEXOR-XR) 75 MG 24 hr capsule Take 75 mg by mouth daily.          BP 104/64  Temp 98 F (36.7 C) (Oral)  Wt 132 lb (59.875 kg)       Objective:   Physical Exam  Constitutional: She appears well-developed and well-nourished.  HENT:  Head: Normocephalic and atraumatic.       Nasal turbinates normal in appearance  Cardiovascular: Normal rate, regular rhythm and normal heart sounds.   Pulmonary/Chest: Effort  normal and breath sounds normal. She has no wheezes.  Skin:       Scaly patch bottom of right foot (medial aspect) 2-3 mm raised slightly scaly lesion right upper chest          Assessment & Plan:

## 2012-12-03 NOTE — Assessment & Plan Note (Signed)
Patient has 3-4 mm lesion on right upper chest that has the appearance of actinic keratosis. Area treated with liquid nitrogen. After care discussed. Reassess in 3 months.

## 2012-12-03 NOTE — Assessment & Plan Note (Signed)
Allergy panel shows sensitivity to dust mites. Patient is taking appropriate environmental measures. Continue over-the-counter antihistamine use. She is reluctant to chronically use intranasal steroids. Patient advised to use Astelin nose spray as needed.

## 2012-12-03 NOTE — Assessment & Plan Note (Signed)
I suspect patient's rash on her foot secondary to tinea pedis. Use Lotrisone cream twice a day for 2 weeks as directed.

## 2012-12-23 ENCOUNTER — Ambulatory Visit (INDEPENDENT_AMBULATORY_CARE_PROVIDER_SITE_OTHER): Payer: Medicare Other | Admitting: Internal Medicine

## 2012-12-23 ENCOUNTER — Encounter: Payer: Self-pay | Admitting: Internal Medicine

## 2012-12-23 VITALS — BP 122/74 | Temp 98.5°F | Wt 132.0 lb

## 2012-12-23 DIAGNOSIS — M549 Dorsalgia, unspecified: Secondary | ICD-10-CM

## 2012-12-23 LAB — POCT URINALYSIS DIPSTICK
Bilirubin, UA: NEGATIVE
Blood, UA: NEGATIVE
Glucose, UA: NEGATIVE
Leukocytes, UA: NEGATIVE
Nitrite, UA: NEGATIVE

## 2012-12-23 MED ORDER — METHOCARBAMOL 500 MG PO TABS: 500.0000 mg | ORAL_TABLET | Freq: Three times a day (TID) | ORAL | Status: DC | PRN

## 2012-12-23 MED ORDER — HYDROCODONE-ACETAMINOPHEN 5-500 MG PO TABS: 1.0000 | ORAL_TABLET | Freq: Three times a day (TID) | ORAL | Status: DC | PRN

## 2012-12-23 NOTE — Progress Notes (Signed)
Subjective:    Patient ID: [name], female    DOB: 1941/08/02, 72 y.o.   MRN: [id number]  HPI  72 year old white female complains of right-sided low back pain. Her symptoms started 24-48 hours ago. She woke up with discomfort in her right back and symptoms progressively got worse. She describes sharp sensation that is worse with bending forward and side bending. She denies any injury or trauma. She denies any urinary symptoms.  Review of Systems Negative for fever or chills No pain with deep inhalation  Past Medical History  Diagnosis Date  . Arthritis     osteoarthritis, hands  . Anxiety     Effexor helps--psychiatrist Dr Evelene Croon  . Chronic headache   . Mild chronic ulcerative colitis   . Atypical chest pain     EF 86%, breast attenuation, no significant ischemia  . GERD (gastroesophageal reflux disease) 11/2009    EGD: Dr. Jarold Motto: erosive stricture dilated  . Anxiety   . Migraines   . 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 Osteoporosis 02/2012    t score -2.5 spine    History   Social History  . Marital Status: Married    Spouse Name: N/A    Number of Children: N/A  . Years of Education: N/A   Occupational History  . Not on file.   Social History Main Topics  . Smoking status: Former Smoker -- 1.0 packs/day for 10 years    Types: Cigarettes    Quit date: 12/15/1988  . Smokeless tobacco: Never Used     Comment: quit in the 1980's  . Alcohol Use: Yes     Comment: 1 glass every few days  . Drug Use: No  . Sexually Active: No   Other Topics Concern  . Not on file   Social History Narrative   Regular exercise: yes    Past Surgical History  Procedure Date  . Laser vaporization of upper vagina 1998  . Vaginal hysterectomy 1978    Family History  Problem Relation Age of Onset  . Cancer Mother     breast  . Heart disease Mother   . Heart failure Mother   . Cancer Father     prostate  . Inflammatory bowel disease  Sister   . Diabetes Maternal Aunt   . Cancer Paternal Grandfather     colon    No Known Allergies  Current Outpatient Prescriptions on File Prior to Visit  Medication Sig Dispense Refill  . azelastine (ASTELIN) 137 MCG/SPRAY nasal spray Place 2 sprays into the nose 2 (two) times daily as needed. Use in each nostril as directed  30 mL  5  . clotrimazole-betamethasone (LOTRISONE) cream Apply topically 2 (two) times daily.  30 g  1  . esomeprazole (NEXIUM) 40 MG capsule Take 1 capsule (40 mg total) by mouth daily before breakfast.  90 capsule  1  . metoprolol succinate (TOPROL-XL) 25 MG 24 hr tablet Take 25 mg by mouth 2 (two) times daily. For headaches.      . SUMAtriptan (IMITREX) 50 MG tablet Take 1 tablet (50 mg total) by mouth daily. For severe headache.  10 tablet  3  . venlafaxine (EFFEXOR-XR) 75 MG 24 hr capsule Take 75 mg by mouth daily.          BP 122/74  Temp 98.5 F (36.9 C) (Oral)  Wt 132 lb (59.875 kg)       Objective:   Physical Exam  Constitutional: She  appears well-developed and well-nourished.  HENT:  Head: Normocephalic and atraumatic.  Cardiovascular: Normal rate, regular rhythm and normal heart sounds.   Pulmonary/Chest: Effort normal and breath sounds normal. She has no wheezes.  Musculoskeletal:       Tenderness near thoracolumbar junction, no obvious rash, discomfort with lumbar flexion , side bending, and rotation          Assessment & Plan:

## 2012-12-23 NOTE — Patient Instructions (Addendum)
Please call our office if your symptoms do not improve or gets worse.  

## 2012-12-23 NOTE — Assessment & Plan Note (Signed)
72 year old white female complains of intermittent sharp pain in her right thoracolumbar junction. Symptoms consistent with muscluloskeletal strain. Use Robaxin and hydrocodone as directed. Patient advised to avoid strenuous activities for 1 to 2 weeks.  Patient advised to call office if symptoms persist or worsen.

## 2012-12-24 ENCOUNTER — Telehealth: Payer: Self-pay | Admitting: Internal Medicine

## 2012-12-24 MED ORDER — HYDROCODONE-ACETAMINOPHEN 5-325 MG PO TABS: 1.0000 | ORAL_TABLET | Freq: Three times a day (TID) | ORAL | Status: DC | PRN

## 2012-12-24 NOTE — Telephone Encounter (Signed)
Please call in vicodin 5/325 # 30 one po tid prn.  RF x 1.  Take pain meds.  If no improvement within by Monday, I suggest MRI of thoracolumbar spine.

## 2012-12-24 NOTE — Telephone Encounter (Signed)
rx called in, pt aware 

## 2012-12-24 NOTE — Telephone Encounter (Signed)
Hydrocodone 5/500 has been d/c and needs to be changed to 5/325. She has 5/325 dated for 06/2011 and if that's ok to take than she can take that and not need a rx.  Back pain is not getting better.  Its a constant pain, taking muscle relaxer Robaxin.  Pain score is 9 wants to know what else to do about it

## 2012-12-24 NOTE — Telephone Encounter (Signed)
Caller: [name]/Patient; Phone: [phone]; Reason for Call: Patient calling to speak to Dr Olegario Messier nurse regarding: 1) Pain medication prescribed is not available any longer; and 2) why is her pain getting worse instead of better? Patient insisted on speaking to Dr Olegario Messier nurse.

## 2012-12-28 ENCOUNTER — Ambulatory Visit
Admission: RE | Admit: 2012-12-28 | Discharge: 2012-12-28 | Disposition: A | Discharge status: Home / Self Care | Payer: Medicare Other | Source: Ambulatory Visit | Attending: Gynecology | Admitting: Gynecology

## 2012-12-28 DIAGNOSIS — Z1231 Encounter for screening mammogram for malignant neoplasm of breast: Secondary | ICD-10-CM

## 2013-01-17 ENCOUNTER — Encounter: Payer: Self-pay | Admitting: Internal Medicine

## 2013-01-24 ENCOUNTER — Other Ambulatory Visit (HOSPITAL_COMMUNITY)
Admission: RE | Admit: 2013-01-24 | Discharge: 2013-01-24 | Disposition: A | Discharge status: Home / Self Care | Payer: Medicare Other | Source: Ambulatory Visit | Attending: Gynecology | Admitting: Gynecology

## 2013-01-24 ENCOUNTER — Encounter: Payer: Self-pay | Admitting: Gynecology

## 2013-01-24 ENCOUNTER — Ambulatory Visit (INDEPENDENT_AMBULATORY_CARE_PROVIDER_SITE_OTHER): Payer: Medicare Other | Admitting: Gynecology

## 2013-01-24 VITALS — BP 122/76 | Ht 65.0 in | Wt 133.0 lb

## 2013-01-24 DIAGNOSIS — M810 Age-related osteoporosis without current pathological fracture: Secondary | ICD-10-CM

## 2013-01-24 DIAGNOSIS — Z124 Encounter for screening for malignant neoplasm of cervix: Secondary | ICD-10-CM | POA: Insufficient documentation

## 2013-01-24 DIAGNOSIS — D072 Carcinoma in situ of vagina: Secondary | ICD-10-CM

## 2013-01-24 DIAGNOSIS — N952 Postmenopausal atrophic vaginitis: Secondary | ICD-10-CM

## 2013-01-24 NOTE — Patient Instructions (Signed)
Start on [place] daily. Call if any questions. Office will contact you to arrange Prolia injections for osteoporosis. Follow up in one year, sooner as needed.

## 2013-01-24 NOTE — Progress Notes (Addendum)
KATHIE POSA 04-12-1941 [id number]        72 y.o.  G1P1001 for follow up exam.  Several issues noted below.  Past medical history,surgical history, medications, allergies, family history and social history were all reviewed and documented in the EPIC chart. ROS:  Was performed and pertinent positives and negatives are included in the history.  Exam: Kim assistant Filed Vitals:   01/24/13 1356  BP: 122/76  Height: 5\' 5"  (1.651 m)  Weight: 133 lb (60.328 kg)   General appearance  Normal Skin grossly normal Head/Neck normal with no cervical or supraclavicular adenopathy thyroid normal Lungs  clear Cardiac RR, without RMG Abdominal  soft, nontender, without masses, organomegaly or hernia Breasts  examined lying and sitting without masses, retractions, discharge or axillary adenopathy.  Bilateral implants noted. Pelvic  Ext/BUS/vagina  normal with atrophic changes. Pap of cuff  Adnexa  Without masses or tenderness    Anus and perineum  normal   Rectovaginal  normal sphincter tone without palpated masses or tenderness.    Assessment/Plan:  72 y.o. G43P1001 female for annual exam.   1. Osteoporosis. DEXA 02/2012 showed T score -2.5. We reviewed previously per 03/19/2012 note. She elected for Fosamax but stopped it because of GI upset. I reviewed alternatives to include monthly bisphosphonate, Prolia, IV bisphosphate. After a lengthy discussion as to the risks/benefits she elects for Prolia. I discussed the possible rash and increased infection rates as well as the osteonecrosis of the jaw atypical fractures. Patient wants to initiate and will make arrangements. Plan on repeating her DEXA next year at a 2 year interval.  Increased calcium vitamin D reviewed.*A vitamin D level at her primary when she has her next blood draw. 2. Pap smear 2013. Pap done today. History of VAIN III status post laser. Continued annual Pap smears. 3. Mammography 12/2012. Has bilateral implants. Is deciding whether  she wants removed as she received a letter from [employer] about helping with the costs.  She's had implants for 30 years. She'll continue with annual mammography. SBE monthly reviewed. 4.  Colonoscopy5 years ago. I've asked her to call her gastroenterologist to see what they want to repeat this. 5. Atrophic vaginitis. Patient continues to vaginal dryness despite OTC moisturizers. We discussed options previously to include estrogen which she has declined. I reviewed Osphena with her to include mechanism of action, risks/benefits as far as breast, bone, coagulation as far his most recent studies. Understands has not been available long-term. Patient wants to try. #15 provided and prescription written. Follow up if any issues. 6. Health maintenance. No blood work done this is all done through her primary physician's office. Follow up one year, sooner as needed.    Dara Lords MD, 2:32 PM 01/24/2013

## 2013-01-25 LAB — URINALYSIS W MICROSCOPIC + REFLEX CULTURE
Bilirubin Urine: NEGATIVE
Casts: NONE SEEN
Crystals: NONE SEEN
Ketones, ur: NEGATIVE mg/dL
Nitrite: NEGATIVE
Specific Gravity, Urine: 1.017 (ref 1.005–1.030)
Squamous Epithelial / LPF: NONE SEEN
pH: 7 (ref 5.0–8.0)

## 2013-01-31 ENCOUNTER — Other Ambulatory Visit: Payer: Self-pay | Admitting: Gynecology

## 2013-01-31 MED ORDER — SULFAMETHOXAZOLE-TRIMETHOPRIM 800-160 MG PO TABS: 1.0000 | ORAL_TABLET | Freq: Two times a day (BID) | ORAL | Status: DC

## 2013-02-01 ENCOUNTER — Telehealth: Payer: Self-pay | Admitting: *Deleted

## 2013-02-01 NOTE — Telephone Encounter (Signed)
Prolia benefits through her insurance.  20% up to her $4900 deductible ($55 met as of today)  Therefore pt will be responsible for 20% of ~$1000 = ~$200 LM for pt to call me back [employer]

## 2013-02-15 NOTE — Telephone Encounter (Signed)
Lm for pt to call back KW

## 2013-02-22 ENCOUNTER — Ambulatory Visit (INDEPENDENT_AMBULATORY_CARE_PROVIDER_SITE_OTHER): Payer: Medicare Other | Admitting: Internal Medicine

## 2013-02-22 ENCOUNTER — Encounter: Payer: Self-pay | Admitting: Internal Medicine

## 2013-02-22 VITALS — BP 122/70 | HR 68 | Temp 98.1°F | Wt 132.0 lb

## 2013-02-22 DIAGNOSIS — R5381 Other malaise: Secondary | ICD-10-CM

## 2013-02-22 LAB — CBC WITH DIFFERENTIAL/PLATELET
Basophils Absolute: 0 10*3/uL (ref 0.0–0.1)
Eosinophils Absolute: 0.1 10*3/uL (ref 0.0–0.7)
HCT: 39.1 % (ref 36.0–46.0)
Lymphs Abs: 1.3 10*3/uL (ref 0.7–4.0)
MCHC: 33.6 g/dL (ref 30.0–36.0)
Monocytes Absolute: 0.4 10*3/uL (ref 0.1–1.0)
Monocytes Relative: 8.4 % (ref 3.0–12.0)
Platelets: 179 10*3/uL (ref 150.0–400.0)
RDW: 13.1 % (ref 11.5–14.6)

## 2013-02-22 LAB — TSH: TSH: 1.41 u[IU]/mL (ref 0.35–5.50)

## 2013-02-22 LAB — BASIC METABOLIC PANEL
BUN: 17 mg/dL (ref 6–23)
CO2: 23 mEq/L (ref 19–32)
GFR: 89.14 mL/min (ref >60.00)
Glucose, Bld: 94 mg/dL (ref 70–99)
Potassium: 4.2 mEq/L (ref 3.5–5.1)

## 2013-02-22 NOTE — Progress Notes (Signed)
Subjective:    Patient ID: [name], female    DOB: 1941/03/27, 72 y.o.   MRN: [id number]  HPI  72 year old white female with multiple complaints. Patient recently seen by her gynecologist. She complained of chronic vaginal dryness and was started on for Osphenia. She took for 8 days. She stopped medications secondary to  palpitations. They lasted approximately 30 minutes. Patient also took Bactrim for urinary tract infection during same time period.  Over last several weeks patient complains of feeling slightly dizzy and tired. She has noticed some swelling in her lower extremities and feels thirsty.   Review of Systems She denies any nausea, vomiting, or diarrhea  Past Medical History  Diagnosis Date  . Arthritis     osteoarthritis, hands  . Anxiety     Effexor helps--psychiatrist Dr Evelene Croon  . Chronic headache   . Mild chronic ulcerative colitis   . Atypical chest pain     EF 86%, breast attenuation, no significant ischemia  . GERD (gastroesophageal reflux disease) 11/2009    EGD: Dr. Jarold Motto: erosive stricture dilated  . Anxiety   . Migraines   . 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 Osteoporosis 02/2012    t score -2.5 spine    History   Social History  . Marital Status: Married    Spouse Name: N/A    Number of Children: N/A  . Years of Education: N/A   Occupational History  . Not on file.   Social History Main Topics  . Smoking status: Former Smoker -- 1.00 packs/day for 10 years    Types: Cigarettes    Quit date: 12/15/1988  . Smokeless tobacco: Never Used     Comment: quit in the 1980's  . Alcohol Use: Yes     Comment: 1 glass every few days  . Drug Use: No  . Sexually Active: No     Comment: HYST   Other Topics Concern  . Not on file   Social History Narrative   Regular exercise: yes    Past Surgical History  Procedure Laterality Date  . Laser vaporization of upper vagina  1998  . Vaginal hysterectomy  1978   . Colposcopy    . Birth mark removed    . Augmentation mammaplasty      Family History  Problem Relation Age of Onset  . Heart disease Mother   . Heart failure Mother   . Breast cancer Mother 35  . Cancer Father     prostate  . Inflammatory bowel disease Sister   . Diabetes Maternal Aunt   . Cancer Paternal Grandfather     colon    No Known Allergies  Current Outpatient Prescriptions on File Prior to Visit  Medication Sig Dispense Refill  . azelastine (ASTELIN) 137 MCG/SPRAY nasal spray Place 2 sprays into the nose 2 (two) times daily as needed. Use in each nostril as directed  30 mL  5  . esomeprazole (NEXIUM) 40 MG capsule Take 1 capsule (40 mg total) by mouth daily before breakfast.  90 capsule  1  . metoprolol succinate (TOPROL-XL) 25 MG 24 hr tablet Take 25 mg by mouth 2 (two) times daily. For headaches.      . SUMAtriptan (IMITREX) 50 MG tablet Take 1 tablet (50 mg total) by mouth daily. For severe headache.  10 tablet  3  . venlafaxine (EFFEXOR-XR) 75 MG 24 hr capsule Take 75 mg by mouth daily.  No current facility-administered medications on file prior to visit.    BP 122/70  Pulse 68  Temp(Src) 98.1 F (36.7 C) (Oral)  Wt 132 lb (59.875 kg)  BMI 21.97 kg/m2       Objective:   Physical Exam  Constitutional: She is oriented to person, place, and time. She appears well-developed and well-nourished.  HENT:  Head: Normocephalic and atraumatic.  Right Ear: External ear normal.  Left Ear: External ear normal.  Mouth/Throat: Oropharynx is clear and moist.  Eyes: Conjunctivae and EOM are normal. Pupils are equal, round, and reactive to light.  Neck: Neck supple.  Cardiovascular: Normal rate, regular rhythm and normal heart sounds.   Pulmonary/Chest: Effort normal and breath sounds normal. She has no wheezes.  Lymphadenopathy:    She has no cervical adenopathy.  Neurological: She is alert and oriented to person, place, and time. No cranial nerve deficit.   Skin: Skin is warm and dry.  Psychiatric: She has a normal mood and affect. Her behavior is normal.          Assessment & Plan:

## 2013-02-22 NOTE — Assessment & Plan Note (Signed)
72 year old female complains of nonspecific fatigue and edema after trial of Osphena and Bactrim. I suspect she may still have residual drug reaction.  Rule out other causes.  Check CBCD, BMET, TFTs.  Patient advised to call office if symptoms persist or worsen.

## 2013-02-22 NOTE — Patient Instructions (Addendum)
Please contact our office if your fatigue does not improve or gets worse.

## 2013-02-25 NOTE — Telephone Encounter (Signed)
Pt informed and will talk with her husband and lmk KW

## 2013-03-04 ENCOUNTER — Ambulatory Visit: Payer: Medicare Other | Admitting: Internal Medicine

## 2013-07-13 ENCOUNTER — Ambulatory Visit: Payer: Medicare Other | Admitting: Cardiology

## 2013-07-17 ENCOUNTER — Encounter: Payer: Self-pay | Admitting: *Deleted

## 2013-07-18 ENCOUNTER — Encounter: Payer: Self-pay | Admitting: Cardiology

## 2013-07-18 ENCOUNTER — Ambulatory Visit (INDEPENDENT_AMBULATORY_CARE_PROVIDER_SITE_OTHER): Payer: Medicare Other | Admitting: Cardiology

## 2013-07-18 VITALS — BP 134/84 | HR 64 | Ht 65.0 in | Wt 131.5 lb

## 2013-07-18 DIAGNOSIS — E785 Hyperlipidemia, unspecified: Secondary | ICD-10-CM

## 2013-07-18 DIAGNOSIS — R002 Palpitations: Secondary | ICD-10-CM

## 2013-07-20 ENCOUNTER — Other Ambulatory Visit: Payer: Self-pay

## 2013-07-22 NOTE — Patient Instructions (Addendum)
Overall stable. It sounds as though if you keep your stress and anxiety levels even, your palpitations are also stable.  Continue current dose of metoprolol, can use an additional dose for particular the bad episodes.  Followup 1 year

## 2013-08-06 ENCOUNTER — Encounter: Payer: Self-pay | Admitting: Cardiology

## 2013-08-06 DIAGNOSIS — E785 Hyperlipidemia, unspecified: Secondary | ICD-10-CM | POA: Insufficient documentation

## 2013-08-06 NOTE — Assessment & Plan Note (Signed)
As best I can tell from Dr. Darrol Poke evaluation on her, she never had a moderate show anything significant for arrhythmia.  Her symptoms seem to be pretty well-controlled on her low dose of Toprol.  For the most part as long as her anxiety is controlled her palpitations are under control.  I told her that if she does have spells with her starting to bother her, she should be fine taking an additional 25 mg Toprol.  Plan: Continue current regimen.  Use when necessary additional dose of Toprol for rapid heart palpitations.

## 2013-08-06 NOTE — Progress Notes (Signed)
Patient ID: [name], female   DOB: 1941/06/24, 72 y.o.   MRN: [id number] PCP: Thomos Lemons, DO  Clinic Note: Chief Complaint  Patient presents with  . Follow-up    Annual checkup, history of palpitations   HPI: [name] is a 72 y.o. female who is a poor patient of Dr. Caprice Kluver.  He last saw her in January 2013 in followup for her palpitations.  There are relatively well-controlled on her metoprolol.  At that time he also noted a "butterfly rash" and sent off samples to check for possible Lupus.  ANA, TSH CBC and ESR were relatively normal.  I don't see a she carries a diagnosis of Lupus.  Interval History:  She presents today relatively well.  She says as long as her stress is under control she is doing fine.  She is trying an exercise, but is mostly using different exercises and maneuvers to use proper breathing techniques to control her stress.  When her stress level is up her palpitations go up this is also associated with anxiety.  As long as he is under control she feels fine.  Now she is in her 88s, she is concerned because her mother had a history of MI when she was in her 18s.  She has not had any episodes of chest tightness or chest pressure with rest or exertion.  She does not note any dyspnea with exertion.  Despite having palpitations she denies any lightheadedness, dizziness or syncope/near syncope.  No TIA or amaurosis fugax symptoms.  No melena, hematochezia or hematuria.  Her energy level seems to be better, with less fatigue.  No claudication symptoms.  Past Medical History  Diagnosis Date  . Arthritis     osteoarthritis, hands  . Anxiety     Effexor helps--psychiatrist Dr Evelene Croon  . Chronic headache   . Mild chronic ulcerative colitis   . Atypical chest pain     EF 86%, breast attenuation, no significant ischemia  . GERD (gastroesophageal reflux disease) 11/2009    EGD: Dr. Jarold Motto: erosive stricture dilated  . Anxiety   . Migraines   . 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 Osteoporosis 02/2012    t score -2.5 spine  . Palpitations     Prior Cardiac Evaluation and Past Surgical History: Past Surgical History  Procedure Laterality Date  . Laser vaporization of upper vagina  1998  . Vaginal hysterectomy  1978  . Colposcopy    . Birth mark removed    . Augmentation mammaplasty    . Nm myocar perf wall motion  01/24/2008    EF 86% neg ischemia   No Known Allergies  Current Outpatient Prescriptions  Medication Sig Dispense Refill  . azelastine (ASTELIN) 137 MCG/SPRAY nasal spray Place 2 sprays into the nose 2 (two) times daily as needed. Use in each nostril as directed  30 mL  5  . metoprolol succinate (TOPROL-XL) 25 MG 24 hr tablet Take 25 mg by mouth 2 (two) times daily. For headaches.      . SUMAtriptan (IMITREX) 50 MG tablet Take 50 mg by mouth as needed. For severe headache.      . venlafaxine (EFFEXOR-XR) 75 MG 24 hr capsule Take 75 mg by mouth daily.         No current facility-administered medications for this visit.   History   Social History Narrative   Married, mother of one.  Very active. Regular exercise  --  yes.  Quit smoking in 1990.  She monitors her diet well.  A glass of wine almost every night.   ROS: A comprehensive Review of Systems - Negative except Pertinent positives as noted above.  Also noted symptoms below. Musculoskeletal ROS: positive for - Knee pain this from osteoporosis. Neurological ROS: no TIA or stroke symptoms Her headaches that she noted to Dr. Clarene Duke have improved quite a bit. She does note some night cramping, but nothing that is extremely uncomfortable.  PHYSICAL EXAM BP 134/84  Pulse 64  Ht 5\' 5"  (1.651 m)  Wt 131 lb 8 oz (59.648 kg)  BMI 21.88 kg/m2 General appearance: alert, cooperative, appears stated age, no distress and Well-nourished and well-groomed.  Otherwise healthy-appearing. Neck: no adenopathy, no carotid bruit, no JVD, supple, symmetrical,  trachea midline and thyroid not enlarged, symmetric, no tenderness/mass/nodules Lungs: clear to auscultation bilaterally, normal percussion bilaterally and Nonlabored, good air movement Heart: regular rate and rhythm, S1, S2 normal, no murmur, click, rub or gallop, normal apical impulse and No ectopy Abdomen: soft, non-tender; bowel sounds normal; no masses,  no organomegaly Extremities: extremities normal, atraumatic, no cyanosis or edema, no edema, redness or tenderness in the calves or thighs and no ulcers, gangrene or trophic changes Pulses: 2+ and symmetric Neurologic: Grossly normal;  I actually don't see any signs of butterfly rash.  [id number] today: Yes Rate: 64 , Rhythm:  Normal sinus rhythm, normal ECG  Recent Labs:  None  ASSESSMENT / PLAN: Heart palpitations As best I can tell from Dr. Darrol Poke evaluation on her, she never had a moderate show anything significant for arrhythmia.  Her symptoms seem to be pretty well-controlled on her low dose of Toprol.  For the most part as long as her anxiety is controlled her palpitations are under control.  I told her that if she does have spells with her starting to bother her, she should be fine taking an additional 25 mg Toprol.  Plan: Continue current regimen.  Use when necessary additional dose of Toprol for rapid heart palpitations.  Dyslipidemia Lipids were not all that bad last checked 2 years ago.  She does have a family history of coronary disease albeit not premature. Simply for risk factor modification identification, will recheck a lipid panel and chemistries.  I like to see her total cholesterol be less than 217 and LDL less than 130.  Her HDL is at goal however as is her triglycerides.  She does not have any of the other components of the Metabolic Syndrome either.   Orders Placed This Encounter  Procedures  . Lipid Profile  . EKG 12-Lead   Followup:  One year  Milarose Savich W. Herbie Baltimore, M.D., M.S. [facility] [address] [place], [place]  [address]  [phone] Pager # [phone]

## 2013-08-06 NOTE — Assessment & Plan Note (Addendum)
Lipids were not all that bad last checked 2 years ago.  She does have a family history of coronary disease albeit not premature. Simply for risk factor modification identification, will recheck a lipid panel and chemistries.  I like to see her total cholesterol be less than 217 and LDL less than 130.  Her HDL is at goal however as is her triglycerides.  She does not have any of the other components of the Metabolic Syndrome either.

## 2013-08-25 ENCOUNTER — Ambulatory Visit (INDEPENDENT_AMBULATORY_CARE_PROVIDER_SITE_OTHER): Payer: Medicare Other | Admitting: Internal Medicine

## 2013-08-25 ENCOUNTER — Encounter: Payer: Self-pay | Admitting: Internal Medicine

## 2013-08-25 VITALS — BP 122/68 | HR 72 | Temp 98.4°F | Wt 131.0 lb

## 2013-08-25 DIAGNOSIS — R5383 Other fatigue: Secondary | ICD-10-CM

## 2013-08-25 DIAGNOSIS — R350 Frequency of micturition: Secondary | ICD-10-CM

## 2013-08-25 DIAGNOSIS — R5381 Other malaise: Secondary | ICD-10-CM

## 2013-08-25 DIAGNOSIS — R002 Palpitations: Secondary | ICD-10-CM

## 2013-08-25 DIAGNOSIS — J309 Allergic rhinitis, unspecified: Secondary | ICD-10-CM

## 2013-08-25 DIAGNOSIS — M2550 Pain in unspecified joint: Secondary | ICD-10-CM

## 2013-08-25 LAB — CBC WITH DIFFERENTIAL/PLATELET
Basophils Absolute: 0 10*3/uL (ref 0.0–0.1)
Basophils Relative: 0.5 % (ref 0.0–3.0)
Eosinophils Absolute: 0.2 10*3/uL (ref 0.0–0.7)
Lymphocytes Relative: 29.4 % (ref 12.0–46.0)
MCHC: 33.9 g/dL (ref 30.0–36.0)
Neutrophils Relative %: 56.1 % (ref 43.0–77.0)
Platelets: 197 10*3/uL (ref 150.0–400.0)
RBC: 4.22 Mil/uL (ref 3.87–5.11)

## 2013-08-25 LAB — POCT URINALYSIS DIPSTICK
Leukocytes, UA: NEGATIVE
Protein, UA: NEGATIVE
Spec Grav, UA: 1.015
Urobilinogen, UA: 0.2

## 2013-08-25 LAB — BASIC METABOLIC PANEL
CO2: 28 mEq/L (ref 19–32)
Calcium: 9.2 mg/dL (ref 8.4–10.5)
Creatinine, Ser: 0.6 mg/dL (ref 0.4–1.2)

## 2013-08-25 LAB — HEPATIC FUNCTION PANEL
Alkaline Phosphatase: 71 U/L (ref 39–117)
Bilirubin, Direct: 0.1 mg/dL (ref 0.0–0.3)
Total Bilirubin: 0.7 mg/dL (ref 0.3–1.2)
Total Protein: 7.4 g/dL (ref 6.0–8.3)

## 2013-08-25 MED ORDER — METOPROLOL SUCCINATE ER 25 MG PO TB24: 25.0000 mg | ORAL_TABLET | Freq: Two times a day (BID) | ORAL | Status: DC

## 2013-08-25 MED ORDER — FLUTICASONE PROPIONATE 50 MCG/ACT NA SUSP: 2.0000 | Freq: Every day | NASAL | Status: DC

## 2013-08-25 NOTE — Assessment & Plan Note (Signed)
72 year old complains of intermittent fatigue and arthralgias. I doubt symptoms secondary to autoimmune phenomenon. Screen with ANA and sedimentation rate. She has history of tick bites. We discussed low risk of line disease in [place]. Check Lyme titer.

## 2013-08-25 NOTE — Assessment & Plan Note (Signed)
Check urinalysis. If normal, consider treatment for overactive bladder.

## 2013-08-25 NOTE — Assessment & Plan Note (Signed)
Stable. Continue same dose of metoprolol.

## 2013-08-25 NOTE — Progress Notes (Signed)
Subjective:    Patient ID: [name], female    DOB: 1941/11/30, 72 y.o.   MRN: [id number]  HPI  72 year old white female with history of intermittent heart palpitations, allergic rhinitis and anxiety disorder for routine followup.Patient reports seeing a cardiologist since previous visit. She continues to take metoprolol once daily. She denies any recurrent palpitations.  She complains of chronic fatigue and intermittent joint pains.  Her cardiologist recommended additional blood testing which she has yet to complete.  She also has intermittent urinary symptoms. She complains of urinary frequency. She denies any dysuria.   Review of Systems History of tick bites, she has dogs Negative for fever, negative for back pain  Past Medical History  Diagnosis Date  . Arthritis     osteoarthritis, hands  . Anxiety     Effexor helps--psychiatrist Dr Evelene Croon  . Chronic headache   . Mild chronic ulcerative colitis   . Atypical chest pain     EF 86%, breast attenuation, no significant ischemia  . GERD (gastroesophageal reflux disease) 11/2009    EGD: Dr. Jarold Motto: erosive stricture dilated  . Anxiety   . Migraines   . 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 Osteoporosis 02/2012    t score -2.5 spine  . Palpitations     History   Social History  . Marital Status: Married    Spouse Name: N/A    Number of Children: N/A  . Years of Education: N/A   Occupational History  . Not on file.   Social History Main Topics  . Smoking status: Former Smoker -- 1.00 packs/day for 10 years    Types: Cigarettes    Quit date: 12/15/1988  . Smokeless tobacco: Never Used     Comment: quit in the 1980's  . Alcohol Use: Yes     Comment: 1 glass every few days  . Drug Use: No  . Sexual Activity: No     Comment: HYST   Other Topics Concern  . Not on file   Social History Narrative   Married, mother of one.  Very active. Regular exercise  -- yes.  Quit smoking in  1990.  She monitors her diet well.  A glass of wine almost every night.    Past Surgical History  Procedure Laterality Date  . Laser vaporization of upper vagina  1998  . Vaginal hysterectomy  1978  . Colposcopy    . Birth mark removed    . Augmentation mammaplasty    . Nm myocar perf wall motion  01/24/2008    EF 86% neg ischemia    Family History  Problem Relation Age of Onset  . Heart disease Mother   . Heart failure Mother   . Breast cancer Mother 30  . Cancer Father     prostate  . Inflammatory bowel disease Sister   . Diabetes Maternal Aunt   . Cancer Paternal Grandfather     colon    No Known Allergies  Current Outpatient Prescriptions on File Prior to Visit  Medication Sig Dispense Refill  . azelastine (ASTELIN) 137 MCG/SPRAY nasal spray Place 2 sprays into the nose 2 (two) times daily as needed. Use in each nostril as directed  30 mL  5  . SUMAtriptan (IMITREX) 50 MG tablet Take 50 mg by mouth as needed. For severe headache.      . venlafaxine (EFFEXOR-XR) 75 MG 24 hr capsule Take 75 mg by mouth daily.  No current facility-administered medications on file prior to visit.    BP 122/68  Pulse 72  Temp(Src) 98.4 F (36.9 C) (Oral)  Wt 131 lb (59.421 kg)  BMI 21.8 kg/m2        Objective:   Physical Exam  Constitutional: She is oriented to person, place, and time. She appears well-developed and well-nourished.  HENT:  Head: Normocephalic and atraumatic.  Right Ear: External ear normal.  Left Ear: External ear normal.  Mouth/Throat: Oropharynx is clear and moist.  Eyes: EOM are normal. Pupils are equal, round, and reactive to light.  Neck: Neck supple.  No carotid bruit  Cardiovascular: Normal rate, regular rhythm and intact distal pulses.   Faint systolic ejection murmur left sternal border  Pulmonary/Chest: Effort normal and breath sounds normal. She has no wheezes.  Abdominal: Soft. Bowel sounds are normal.  Mild suprapubic tenderness   Lymphadenopathy:    She has no cervical adenopathy.  Neurological: She is alert and oriented to person, place, and time. No cranial nerve deficit.  Skin: Skin is warm and dry. No rash noted.  Psychiatric: She has a normal mood and affect. Her behavior is normal.          Assessment & Plan:

## 2013-08-25 NOTE — Assessment & Plan Note (Signed)
Patient having flare of allergic rhinitis. I advised patient take over-the-counter fexofenadine once daily. Also use intranasal fluticasone 2 sprays each nostril once daily.

## 2013-08-25 NOTE — Patient Instructions (Signed)
Please sign on to MyChart to view your test results.  

## 2013-08-26 ENCOUNTER — Telehealth: Payer: Self-pay | Admitting: Internal Medicine

## 2013-08-26 LAB — ANA: Anti Nuclear Antibody(ANA): NEGATIVE

## 2013-08-26 NOTE — Telephone Encounter (Addendum)
Pt is returning cindy call ?labwork results

## 2013-08-26 NOTE — Telephone Encounter (Signed)
Results release to MyChart.  Lyme results only test still pending.

## 2013-08-29 ENCOUNTER — Encounter: Payer: Self-pay | Admitting: Internal Medicine

## 2013-08-29 ENCOUNTER — Ambulatory Visit (INDEPENDENT_AMBULATORY_CARE_PROVIDER_SITE_OTHER): Payer: Medicare Other | Admitting: Internal Medicine

## 2013-08-29 VITALS — BP 110/70 | Temp 98.5°F | Wt 131.0 lb

## 2013-08-29 DIAGNOSIS — R51 Headache: Secondary | ICD-10-CM

## 2013-08-29 DIAGNOSIS — M2550 Pain in unspecified joint: Secondary | ICD-10-CM

## 2013-08-29 MED ORDER — AMITRIPTYLINE HCL 10 MG PO TABS: 10.0000 mg | ORAL_TABLET | Freq: Every day | ORAL | Status: DC

## 2013-08-29 NOTE — Assessment & Plan Note (Signed)
Her symptoms likely from degenerative joint disease. Her ANA and sedimentation rate were unremarkable.

## 2013-08-29 NOTE — Progress Notes (Signed)
Subjective:    Patient ID: [name], female    DOB: May 14, 1941, 72 y.o.   MRN: [id number]  HPI  72 year old white female previously seen for chronic fatigue and intermittent joint pains for followup. Patient's blood work was unremarkable. Her ANA and sedimentation rate were normal.  Her allergy symptoms are somewhat improved. However, she complains of intermittent right-sided facial pain. She's had similar pains in the past associated with migraines.  Review of Systems Negative for rash, no visual changes or eye pain  Past Medical History  Diagnosis Date  . Arthritis     osteoarthritis, hands  . Anxiety     Effexor helps--psychiatrist Dr Evelene Croon  . Chronic headache   . Mild chronic ulcerative colitis   . Atypical chest pain     EF 86%, breast attenuation, no significant ischemia  . GERD (gastroesophageal reflux disease) 11/2009    EGD: Dr. Jarold Motto: erosive stricture dilated  . Anxiety   . Migraines   . 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 Osteoporosis 02/2012    t score -2.5 spine  . Palpitations     History   Social History  . Marital Status: Married    Spouse Name: N/A    Number of Children: N/A  . Years of Education: N/A   Occupational History  . Not on file.   Social History Main Topics  . Smoking status: Former Smoker -- 1.00 packs/day for 10 years    Types: Cigarettes    Quit date: 12/15/1988  . Smokeless tobacco: Never Used     Comment: quit in the 1980's  . Alcohol Use: Yes     Comment: 1 glass every few days  . Drug Use: No  . Sexual Activity: No     Comment: HYST   Other Topics Concern  . Not on file   Social History Narrative   Married, mother of one.  Very active. Regular exercise  -- yes.  Quit smoking in 1990.  She monitors her diet well.  A glass of wine almost every night.    Past Surgical History  Procedure Laterality Date  . Laser vaporization of upper vagina  1998  . Vaginal hysterectomy  1978  .  Colposcopy    . Birth mark removed    . Augmentation mammaplasty    . Nm myocar perf wall motion  01/24/2008    EF 86% neg ischemia    Family History  Problem Relation Age of Onset  . Heart disease Mother   . Heart failure Mother   . Breast cancer Mother 32  . Cancer Father     prostate  . Inflammatory bowel disease Sister   . Diabetes Maternal Aunt   . Cancer Paternal Grandfather     colon    No Known Allergies  Current Outpatient Prescriptions on File Prior to Visit  Medication Sig Dispense Refill  . azelastine (ASTELIN) 137 MCG/SPRAY nasal spray Place 2 sprays into the nose 2 (two) times daily as needed. Use in each nostril as directed  30 mL  5  . fluticasone (FLONASE) 50 MCG/ACT nasal spray Place 2 sprays into the nose daily.  16 g  5  . metoprolol succinate (TOPROL-XL) 25 MG 24 hr tablet Take 1 tablet (25 mg total) by mouth 2 (two) times daily. For headaches.  90 tablet  1  . SUMAtriptan (IMITREX) 50 MG tablet Take 50 mg by mouth as needed. For severe headache.      Marland Kitchen  venlafaxine (EFFEXOR-XR) 75 MG 24 hr capsule Take 75 mg by mouth daily.         No current facility-administered medications on file prior to visit.    BP 110/70  Temp(Src) 98.5 F (36.9 C) (Oral)  Wt 131 lb (59.421 kg)  BMI 21.8 kg/m2       Objective:   Physical Exam  Constitutional: She is oriented to person, place, and time. She appears well-developed and well-nourished.  HENT:  Head: Normocephalic and atraumatic.  Right Ear: External ear normal.  Mouth/Throat: Oropharynx is clear and moist.  Neck: Neck supple.  Cardiovascular: Normal rate, regular rhythm and normal heart sounds.   No murmur heard. Pulmonary/Chest: Effort normal and breath sounds normal. She has no wheezes.  Lymphadenopathy:    She has no cervical adenopathy.  Neurological: She is alert and oriented to person, place, and time. No cranial nerve deficit.  Psychiatric: She has a normal mood and affect. Her behavior is  normal.          Assessment & Plan:

## 2013-08-29 NOTE — Assessment & Plan Note (Signed)
I doubt her symptoms from sinusitis. Patient has had similar right facial pain associated with migraines. Trial of low-dose amitriptyline.  Patient advised to call office if symptoms persist or worsen.

## 2013-09-01 ENCOUNTER — Telehealth: Payer: Self-pay | Admitting: Internal Medicine

## 2013-09-01 DIAGNOSIS — H539 Unspecified visual disturbance: Secondary | ICD-10-CM

## 2013-09-01 NOTE — Telephone Encounter (Signed)
Refer to [facility] opthal[facility] - Dr. Burgess Estelle

## 2013-09-01 NOTE — Telephone Encounter (Signed)
Referral request sent 

## 2013-09-01 NOTE — Telephone Encounter (Signed)
Pt called to request a referral to see an opthalmologist. Please assist.

## 2013-09-01 NOTE — Telephone Encounter (Signed)
Ok to refer.

## 2013-09-12 NOTE — Telephone Encounter (Signed)
Can you call [facility] and have them fax results of Lyme blood test?

## 2013-09-14 NOTE — Telephone Encounter (Signed)
On your desk

## 2013-09-15 NOTE — Telephone Encounter (Signed)
Call pt - lyme testing was negative

## 2013-09-16 NOTE — Telephone Encounter (Signed)
Pt aware.

## 2013-09-22 ENCOUNTER — Ambulatory Visit (INDEPENDENT_AMBULATORY_CARE_PROVIDER_SITE_OTHER): Payer: Medicare Other | Admitting: Internal Medicine

## 2013-09-22 ENCOUNTER — Encounter: Payer: Self-pay | Admitting: Internal Medicine

## 2013-09-22 VITALS — BP 116/74 | HR 72 | Temp 98.2°F | Resp 16 | Ht 65.0 in | Wt 132.0 lb

## 2013-09-22 DIAGNOSIS — F341 Dysthymic disorder: Secondary | ICD-10-CM

## 2013-09-22 DIAGNOSIS — R51 Headache: Secondary | ICD-10-CM

## 2013-09-22 MED ORDER — METHOCARBAMOL 500 MG PO TABS: 500.0000 mg | ORAL_TABLET | Freq: Two times a day (BID) | ORAL | Status: DC | PRN

## 2013-09-22 MED ORDER — FLUOXETINE HCL 10 MG PO CAPS: 10.0000 mg | ORAL_CAPSULE | Freq: Every day | ORAL | Status: DC

## 2013-09-22 MED ORDER — METOPROLOL TARTRATE 25 MG PO TABS: 25.0000 mg | ORAL_TABLET | Freq: Two times a day (BID) | ORAL | Status: DC

## 2013-09-22 MED ORDER — AMITRIPTYLINE HCL 10 MG PO TABS: 10.0000 mg | ORAL_TABLET | Freq: Every day | ORAL | Status: DC

## 2013-09-22 MED ORDER — VENLAFAXINE HCL 37.5 MG PO TABS: 37.5000 mg | ORAL_TABLET | Freq: Every day | ORAL | Status: DC

## 2013-09-22 NOTE — Assessment & Plan Note (Addendum)
Patient has history of chronic intermittent right-sided headaches. Her headaches may be tension related. She has intermittent associated right neck strain. Trial of Robaxin 500 mg twice a day as needed.  Patient encouraged to use amitriptyline on a regular basis. She understands there are no dependency issues with using amitriptyline.

## 2013-09-22 NOTE — Patient Instructions (Signed)
Taper off Venlafaxine as directed

## 2013-09-22 NOTE — Progress Notes (Signed)
Subjective:    Patient ID: [name], female    DOB: [birth year], 72 y.o.   MRN: [id number]  HPI  72 year old white female previously seen for chronic intermittent headaches for followup. Patient reports her headache slightly improved since using amitriptyline. She has only taken 2 or 3 doses. It also helps her sleep.  Patient feels her headaches are worse when she has right sided neck tension.  She has history of adjustment disorder with anxiety/depression. She was started on Effexor several years ago. Patient would like to taper off generic Effexor if possible.  Review of Systems Negative for fever or chills.  No radiation of neck pain    Past Medical History  Diagnosis Date  . Arthritis     osteoarthritis, hands  . Anxiety     Effexor helps--psychiatrist Dr Evelene Croon  . Chronic headache   . Mild chronic ulcerative colitis   . Atypical chest pain     EF 86%, breast attenuation, no significant ischemia  . GERD (gastroesophageal reflux disease) 11/2009    EGD: Dr. Jarold Motto: erosive stricture dilated  . Anxiety   . Migraines   . 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 Osteoporosis 02/2012    t score -2.5 spine  . Palpitations     History   Social History  . Marital Status: Married    Spouse Name: N/A    Number of Children: N/A  . Years of Education: N/A   Occupational History  . Not on file.   Social History Main Topics  . Smoking status: Former Smoker -- 1.00 packs/day for 10 years    Types: Cigarettes    Quit date: 12/15/1988  . Smokeless tobacco: Never Used     Comment: quit in the 1980's  . Alcohol Use: Yes     Comment: 1 glass every few days  . Drug Use: No  . Sexual Activity: No     Comment: HYST   Other Topics Concern  . Not on file   Social History Narrative   Married, mother of one.  Very active. Regular exercise  -- yes.  Quit smoking in 1990.  She monitors her diet well.  A glass of wine almost every night.    Past  Surgical History  Procedure Laterality Date  . Laser vaporization of upper vagina  1998  . Vaginal hysterectomy  1978  . Colposcopy    . Birth mark removed    . Augmentation mammaplasty    . Nm myocar perf wall motion  01/24/2008    EF 86% neg ischemia    Family History  Problem Relation Age of Onset  . Heart disease Mother   . Heart failure Mother   . Breast cancer Mother 31  . Cancer Father     prostate  . Inflammatory bowel disease Sister   . Diabetes Maternal Aunt   . Cancer Paternal Grandfather     colon    No Known Allergies  Current Outpatient Prescriptions on File Prior to Visit  Medication Sig Dispense Refill  . azelastine (ASTELIN) 137 MCG/SPRAY nasal spray Place 2 sprays into the nose 2 (two) times daily as needed. Use in each nostril as directed  30 mL  5  . fluticasone (FLONASE) 50 MCG/ACT nasal spray Place 2 sprays into the nose daily.  16 g  5  . SUMAtriptan (IMITREX) 50 MG tablet Take 50 mg by mouth as needed. For severe headache.      . venlafaxine (EFFEXOR-XR)  75 MG 24 hr capsule Take 75 mg by mouth daily.         No current facility-administered medications on file prior to visit.    BP 116/74  Pulse 72  Temp(Src) 98.2 F (36.8 C)  Resp 16  Ht 5\' 5"  (1.651 m)  Wt 132 lb (59.875 kg)  BMI 21.97 kg/m2    Objective:   Physical Exam  Constitutional: She is oriented to person, place, and time. She appears well-developed and well-nourished.  Neck: Normal range of motion. Neck supple.  Cardiovascular: Normal rate, regular rhythm and normal heart sounds.   No murmur heard. Pulmonary/Chest: Effort normal and breath sounds normal. She has no wheezes.  Neurological: She is alert and oriented to person, place, and time. No cranial nerve deficit.          Assessment & Plan:

## 2013-09-22 NOTE — Assessment & Plan Note (Signed)
Patient would like to taper off Effexor. Transitioned to generic Prozac. Taper instructions over next 8 weeks provided. Reassess in 2 months.

## 2013-11-18 ENCOUNTER — Ambulatory Visit (INDEPENDENT_AMBULATORY_CARE_PROVIDER_SITE_OTHER): Payer: Medicare Other | Admitting: Internal Medicine

## 2013-11-18 ENCOUNTER — Encounter: Payer: Self-pay | Admitting: Internal Medicine

## 2013-11-18 VITALS — BP 130/68 | Temp 98.7°F | Wt 132.0 lb

## 2013-11-18 DIAGNOSIS — M5412 Radiculopathy, cervical region: Secondary | ICD-10-CM

## 2013-11-18 DIAGNOSIS — M542 Cervicalgia: Secondary | ICD-10-CM

## 2013-11-18 DIAGNOSIS — J309 Allergic rhinitis, unspecified: Secondary | ICD-10-CM

## 2013-11-18 HISTORY — DX: Radiculopathy, cervical region: M54.12

## 2013-11-18 MED ORDER — OXYCODONE-ACETAMINOPHEN 5-325 MG PO TABS: 1.0000 | ORAL_TABLET | Freq: Two times a day (BID) | ORAL | Status: DC | PRN

## 2013-11-18 MED ORDER — PREDNISONE 10 MG PO TABS: ORAL_TABLET | ORAL | Status: DC

## 2013-11-18 MED ORDER — ALPRAZOLAM 0.25 MG PO TABS: ORAL_TABLET | ORAL | Status: DC

## 2013-11-18 NOTE — Progress Notes (Signed)
Pre visit review using our clinic review tool, if applicable. No additional management support is needed unless otherwise documented below in the visit note. 

## 2013-11-18 NOTE — Progress Notes (Signed)
Subjective:    Patient ID: [name], female    DOB: 08/31/1941, 72 y.o.   MRN: [id number]  HPI  A 72 year old white female with history of allergic rhinitis complains of exacerbation of her last 2-4 weeks. Patient reports clear nasal drainage especially from her right side. She has mild sinus pressure (right maxillary sinus area).  Patient also complains of severe intermittent left-sided neck pain. She denies any history of injury or trauma. Pain radiates to left arm. Severity is 8/10. She had difficulty sleeping last night. Radiation of pain worse with certain neck movements.  Review of Systems Negative for fever, mild dry cough    Past Medical History  Diagnosis Date  . Arthritis     osteoarthritis, hands  . Anxiety     Effexor helps--psychiatrist Dr Evelene Croon  . Chronic headache   . Mild chronic ulcerative colitis   . Atypical chest pain     EF 86%, breast attenuation, no significant ischemia  . GERD (gastroesophageal reflux disease) 11/2009    EGD: Dr. Jarold Motto: erosive stricture dilated  . Anxiety   . Migraines   . 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 Osteoporosis 02/2012    t score -2.5 spine  . Palpitations     History   Social History  . Marital Status: Married    Spouse Name: N/A    Number of Children: N/A  . Years of Education: N/A   Occupational History  . Not on file.   Social History Main Topics  . Smoking status: Former Smoker -- 1.00 packs/day for 10 years    Types: Cigarettes    Quit date: 12/15/1988  . Smokeless tobacco: Never Used     Comment: quit in the 1980's  . Alcohol Use: Yes     Comment: 1 glass every few days  . Drug Use: No  . Sexual Activity: No     Comment: HYST   Other Topics Concern  . Not on file   Social History Narrative   Married, mother of one.  Very active. Regular exercise  -- yes.  Quit smoking in 1990.  She monitors her diet well.  A glass of wine almost every night.    Past  Surgical History  Procedure Laterality Date  . Laser vaporization of upper vagina  1998  . Vaginal hysterectomy  1978  . Colposcopy    . Birth mark removed    . Augmentation mammaplasty    . Nm myocar perf wall motion  01/24/2008    EF 86% neg ischemia    Family History  Problem Relation Age of Onset  . Heart disease Mother   . Heart failure Mother   . Breast cancer Mother 11  . Cancer Father     prostate  . Inflammatory bowel disease Sister   . Diabetes Maternal Aunt   . Cancer Paternal Grandfather     colon    No Known Allergies  Current Outpatient Prescriptions on File Prior to Visit  Medication Sig Dispense Refill  . amitriptyline (ELAVIL) 10 MG tablet Take 1 tablet (10 mg total) by mouth at bedtime.  90 tablet  1  . azelastine (ASTELIN) 137 MCG/SPRAY nasal spray Place 2 sprays into the nose 2 (two) times daily as needed. Use in each nostril as directed  30 mL  5  . FLUoxetine (PROZAC) 10 MG capsule Take 1 capsule (10 mg total) by mouth daily.  30 capsule  3  . fluticasone (FLONASE)  50 MCG/ACT nasal spray Place 2 sprays into the nose daily.  16 g  5  . methocarbamol (ROBAXIN) 500 MG tablet Take 1 tablet (500 mg total) by mouth 2 (two) times daily as needed.  60 tablet  1  . metoprolol tartrate (LOPRESSOR) 25 MG tablet Take 1 tablet (25 mg total) by mouth 2 (two) times daily.  180 tablet  1  . SUMAtriptan (IMITREX) 50 MG tablet Take 50 mg by mouth as needed. For severe headache.      . venlafaxine (EFFEXOR) 37.5 MG tablet Take 1 tablet (37.5 mg total) by mouth daily.  30 tablet  1   No current facility-administered medications on file prior to visit.    BP 130/68  Temp(Src) 98.7 F (37.1 C) (Oral)  Wt 132 lb (59.875 kg)    Objective:   Physical Exam  Constitutional: She is oriented to person, place, and time. She appears well-developed and well-nourished. No distress.  Neck:  Decreased ROM, tenderness left lower neck (near C6)  Cardiovascular: Normal rate,  regular rhythm and normal heart sounds.   Pulmonary/Chest: Effort normal and breath sounds normal. She has no wheezes.  Lymphadenopathy:    She has no cervical adenopathy.  Neurological: She is oriented to person, place, and time. She displays normal reflexes. She exhibits normal muscle tone.  Mild weakness of left wrist extensors  Skin: Skin is warm and dry.  Psychiatric: She has a normal mood and affect. Her behavior is normal.          Assessment & Plan:

## 2013-11-18 NOTE — Assessment & Plan Note (Signed)
Patient experiencing exacerbation. I doubt she has bacterial sinusitis. Prednisone taper should help her rhinitis.

## 2013-11-18 NOTE — Assessment & Plan Note (Signed)
72 year old female with intermittent severe left-sided neck pain that radiates to her left arm. She has weak wrist extensors on exam.  Treat with prednisone taper and use oxycodone acetaminophen 5/325 twice daily as needed. Obtain MRI of cervical spine. She gets claustrophobic. Use alprazolam as directed 30 minutes before MRI. Reassess in 2 weeks.

## 2013-11-28 ENCOUNTER — Ambulatory Visit (INDEPENDENT_AMBULATORY_CARE_PROVIDER_SITE_OTHER): Payer: Medicare Other | Admitting: Family Medicine

## 2013-11-28 ENCOUNTER — Encounter: Payer: Self-pay | Admitting: Family Medicine

## 2013-11-28 VITALS — BP 112/68 | HR 60 | Temp 98.6°F | Wt 132.0 lb

## 2013-11-28 DIAGNOSIS — R05 Cough: Secondary | ICD-10-CM

## 2013-11-28 DIAGNOSIS — R059 Cough, unspecified: Secondary | ICD-10-CM

## 2013-11-28 DIAGNOSIS — R509 Fever, unspecified: Secondary | ICD-10-CM

## 2013-11-28 MED ORDER — LEVOFLOXACIN 500 MG PO TABS: 500.0000 mg | ORAL_TABLET | Freq: Every day | ORAL | Status: DC

## 2013-11-28 NOTE — Progress Notes (Signed)
   Subjective:    Patient ID: [name], female    DOB: 05/22/1941, 72 y.o.   MRN: [id number]  HPI Acute visit for cough and fever over the past few days. Patient had onset of cough and chills last week. Over the weekend she had fever up to 102. Cough productive of green sputum. Quit smoking several years ago. Denies any nausea or vomiting. No dyspnea. Increased malaise. No pleuritic pain. No hemoptysis. No known drug allergies.  Past Medical History  Diagnosis Date  . Arthritis     osteoarthritis, hands  . Anxiety     Effexor helps--psychiatrist Dr Evelene Croon  . Chronic headache   . Mild chronic ulcerative colitis   . Atypical chest pain     EF 86%, breast attenuation, no significant ischemia  . GERD (gastroesophageal reflux disease) 11/2009    EGD: Dr. Jarold Motto: erosive stricture dilated  . Anxiety   . Migraines   . 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 Osteoporosis 02/2012    t score -2.5 spine  . Palpitations    Past Surgical History  Procedure Laterality Date  . Laser vaporization of upper vagina  1998  . Vaginal hysterectomy  1978  . Colposcopy    . Birth mark removed    . Augmentation mammaplasty    . Nm myocar perf wall motion  01/24/2008    EF 86% neg ischemia    reports that she quit smoking about 24 years ago. Her smoking use included Cigarettes. She has a 10 pack-year smoking history. She has never used smokeless tobacco. She reports that she drinks alcohol. She reports that she does not use illicit drugs. family history includes Breast cancer (age of onset: 40) in her mother; Cancer in her father and paternal grandfather; Diabetes in her maternal aunt; Heart disease in her mother; Heart failure in her mother; Inflammatory bowel disease in her sister. No Known Allergies    Review of Systems  Constitutional: Positive for fever and chills.  HENT: Negative for congestion.   Respiratory: Positive for cough. Negative for shortness of  breath.   Gastrointestinal: Negative for nausea, vomiting and diarrhea.       Objective:   Physical Exam  Constitutional: She appears well-developed and well-nourished.  HENT:  Right Ear: External ear normal.  Left Ear: External ear normal.  Mouth/Throat: Oropharynx is clear and moist.  Neck: Neck supple.  Cardiovascular: Normal rate.   Pulmonary/Chest: Effort normal and breath sounds normal. No respiratory distress. She has no wheezes. She has no rales.  Lymphadenopathy:    She has no cervical adenopathy.          Assessment & Plan:  Patient presents with several day history of productive cough and fever. Even though her exam is nonfocal concern is duration of fever and cough. She has pulse oxygen 95% and no respiratory distress. Start Levaquin 500 milligrams once daily. Chest x-ray and further evaluation if not promptly improving in the next couple days

## 2013-11-28 NOTE — Patient Instructions (Signed)
Follow up promptly for any increased shortness of breath or persistent fever.

## 2013-11-28 NOTE — Progress Notes (Signed)
Pre visit review using our clinic review tool, if applicable. No additional management support is needed unless otherwise documented below in the visit note. 

## 2013-12-01 ENCOUNTER — Ambulatory Visit (INDEPENDENT_AMBULATORY_CARE_PROVIDER_SITE_OTHER): Payer: Medicare Other | Admitting: Family Medicine

## 2013-12-01 ENCOUNTER — Encounter: Payer: Self-pay | Admitting: Family Medicine

## 2013-12-01 ENCOUNTER — Telehealth: Payer: Self-pay | Admitting: Internal Medicine

## 2013-12-01 VITALS — BP 118/73 | HR 58 | Temp 97.6°F | Resp 18 | Ht 65.0 in | Wt 126.0 lb

## 2013-12-01 DIAGNOSIS — J209 Acute bronchitis, unspecified: Secondary | ICD-10-CM | POA: Insufficient documentation

## 2013-12-01 DIAGNOSIS — J111 Influenza due to unidentified influenza virus with other respiratory manifestations: Secondary | ICD-10-CM

## 2013-12-01 DIAGNOSIS — T50905A Adverse effect of unspecified drugs, medicaments and biological substances, initial encounter: Secondary | ICD-10-CM

## 2013-12-01 DIAGNOSIS — T887XXA Unspecified adverse effect of drug or medicament, initial encounter: Secondary | ICD-10-CM | POA: Insufficient documentation

## 2013-12-01 MED ORDER — METHYLPREDNISOLONE ACETATE 80 MG/ML IJ SUSP
80.0000 mg | Freq: Once | INTRAMUSCULAR | Status: AC
  Administered 2013-12-01: 80 mg via INTRAMUSCULAR

## 2013-12-01 NOTE — Progress Notes (Signed)
Pre visit review using our clinic review tool, if applicable. No additional management support is needed unless otherwise documented below in the visit note.  OFFICE NOTE  12/01/2013  CC:  Chief Complaint  Patient presents with  . Dizziness     HPI: Patient is a 72 y.o. Caucasian female who is here for dizziness. Recent cough/fever illness (flu-like illness) and she tried to let it run its course for 4-5d, and was seen 12/15 and started on levaquin empirically for suspected secondary bacterial infection.  She took two doses of levaquin and had profound expectoration of mucous, also started getting nauseated and lightheaded and felt the shakes. Stopped the levaquin yesterday and nausea, dizziness, tremulousness has resolved. Much less cough and mucous production.  Still with achiness in upper back.  No fevers. Not taking any symptomatic care meds right now.  Energy level still down.  PO intake improving esp with fluids.    Pertinent PMH:  Past Medical History  Diagnosis Date  . Arthritis     osteoarthritis, hands  . Anxiety     Effexor helps--psychiatrist Dr Evelene Croon  . Chronic headache   . Mild chronic ulcerative colitis   . Atypical chest pain     EF 86%, breast attenuation, no significant ischemia  . GERD (gastroesophageal reflux disease) 11/2009    EGD: Dr. Jarold Motto: erosive stricture dilated  . Anxiety   . Migraines   . 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 Osteoporosis 02/2012    t score -2.5 spine  . Palpitations     MEDS:  Outpatient Prescriptions Prior to Visit  Medication Sig Dispense Refill  . amitriptyline (ELAVIL) 10 MG tablet Take 1 tablet (10 mg total) by mouth at bedtime.  90 tablet  1  . metoprolol tartrate (LOPRESSOR) 25 MG tablet Take 1 tablet (25 mg total) by mouth 2 (two) times daily.  180 tablet  1  . SUMAtriptan (IMITREX) 50 MG tablet Take 50 mg by mouth as needed. For severe headache.      . venlafaxine (EFFEXOR) 37.5 MG  tablet Take 1 tablet (37.5 mg total) by mouth daily.  30 tablet  1  . ALPRAZolam (XANAX) 0.25 MG tablet Take one to two tablets 30 minutes before MRI  10 tablet  0  . azelastine (ASTELIN) 137 MCG/SPRAY nasal spray Place 2 sprays into the nose 2 (two) times daily as needed. Use in each nostril as directed  30 mL  5  . FLUoxetine (PROZAC) 10 MG capsule Take 1 capsule (10 mg total) by mouth daily.  30 capsule  3  . fluticasone (FLONASE) 50 MCG/ACT nasal spray Place 2 sprays into the nose daily.  16 g  5  . levofloxacin (LEVAQUIN) 500 MG tablet Take 1 tablet (500 mg total) by mouth daily.  10 tablet  0  . methocarbamol (ROBAXIN) 500 MG tablet Take 1 tablet (500 mg total) by mouth 2 (two) times daily as needed.  60 tablet  1  . oxyCODONE-acetaminophen (ROXICET) 5-325 MG per tablet Take 1 tablet by mouth every 12 (twelve) hours as needed for severe pain.  30 tablet  0  . predniSONE (DELTASONE) 10 MG tablet Take 3 tabs once daily for 3 days, then 2 tabs once daily for 3 days then 1 tab once daily for 3 days  18 tablet  0   No facility-administered medications prior to visit.  Not taking prednisone or levaquin or prozac as listed above.  PE: Blood pressure 118/73, pulse 58,  temperature 97.6 F (36.4 C), temperature source Temporal, resp. rate 18, height 5\' 5"  (1.651 m), weight 126 lb (57.153 kg), SpO2 100.00%. VS: noted--normal. Gen: alert, NAD, NONTOXIC APPEARING. HEENT: eyes without injection, drainage, or swelling.  Ears: EACs clear, TMs with normal light reflex and landmarks.  Nose: Clear rhinorrhea, with some dried, crusty exudate adherent to mildly injected mucosa.  No purulent d/c.  No paranasal sinus TTP.  No facial swelling.  Throat and mouth without focal lesion.  No pharyngial swelling, erythema, or exudate.   Neck: supple, no LAD.   LUNGS: CTA bilat, nonlabored resps.  Diminished capacity to exhale and when she does exhale forcefully she has a coarse wheeze and some post-exhalation coughing.    CV: RRR, no m/r/g. EXT: no c/c/e SKIN: no rash  LAB: none  IMPRESSION AND PLAN:  Flu-like illness, improving but with mild RAD/bronchitis.   She is an ex-smoker but denies sx's of COPD. Discussed treatment options today and decided on depo-medrol 80mg  IM. Discussed robitussin DM.   No further antibiotics needed.  Will note intolerance to levaquin in chart.  FOLLOW UP: prn

## 2013-12-01 NOTE — Telephone Encounter (Signed)
Patient Information:  Caller Name: Cintya  Phone: [phone]  Patient: [name], [name]  Gender: Female  DOB: 04/23/1941  Age: 72 Years  PCP: Thomos Lemons  Office Follow Up:  Does the office need to follow up with this patient?: No  Instructions For The Office: N/A  RN Note:  Fever resolved 11/28/13. Stopped Levaquin after 2 days. No appontiments available for 12/01/13 or 12/02/13 at [facility] or [facility].  Scheduled for 12/01/13 at 1330 with Dr Milinda Cave at [facility].   Symptoms  Reason For Call & Symptoms: Nausea with dizziness caller related to use of Levaquin.  Started Levaquin 11/28/13 for cough with fever.  Stopped taking Levaquin 11/29/13. Nausea resolved; continues to have mild dizziness.  Reviewed Health History In EMR: Yes  Reviewed Medications In EMR: Yes  Reviewed Allergies In EMR: Yes  Reviewed Surgeries / Procedures: Yes  Date of Onset of Symptoms: 11/29/2013  Treatments Tried: Rest, stopped taking 11/29/13  Treatments Tried Worked: No  Guideline(s) Used:  Dizziness  Disposition Per Guideline:   Go to Office Now  Reason For Disposition Reached:   Lightheadedness (dizziness) present now, after 2 hours of rest and fluids  Advice Given:  Drink Fluids:  Drink several glasses of fruit juice, other clear fluids, or water. This will improve hydration and blood glucose. If you have a fever or have had heat exposure, make sure the fluids are cold.  Rest for 1-2 Hours:  Lie down with feet elevated for 1 hour. This will improve blood flow and increase blood flow to the brain.  Stand Up Slowly:  In the mornings, sit up for a few minutes before you stand up. That will help your blood flow make the adjustment.  Sit down or lie down if you feel dizzy.  Call Back If:  Passes out (faints)  You become worse.  Patient Will Follow Care Advice:  YES

## 2013-12-01 NOTE — Addendum Note (Signed)
Addended by: Eulah Pont on: 12/01/2013 03:02 PM   Modules accepted: Orders

## 2013-12-02 ENCOUNTER — Other Ambulatory Visit: Payer: Medicare Other

## 2014-01-25 ENCOUNTER — Encounter: Payer: Medicare Other | Admitting: Gynecology

## 2014-02-15 ENCOUNTER — Ambulatory Visit: Payer: Medicare Other | Admitting: Family Medicine

## 2014-02-27 ENCOUNTER — Telehealth: Payer: Self-pay | Admitting: Internal Medicine

## 2014-02-27 NOTE — Telephone Encounter (Signed)
Pt states she is has come off effexor and the prozac just doesn't seem enough. Pt prefers to see Dr Shawna Orleans. Would like sooner appt than Monday 3/30 if possible. pls advise

## 2014-02-27 NOTE — Telephone Encounter (Signed)
See if she can come in on Wednesday 03/01/14 at 4

## 2014-03-01 ENCOUNTER — Encounter: Payer: Self-pay | Admitting: Internal Medicine

## 2014-03-01 ENCOUNTER — Ambulatory Visit (INDEPENDENT_AMBULATORY_CARE_PROVIDER_SITE_OTHER): Payer: Medicare HMO | Admitting: Internal Medicine

## 2014-03-01 VITALS — BP 104/60 | Temp 98.4°F | Ht 65.0 in | Wt 132.0 lb

## 2014-03-01 DIAGNOSIS — J309 Allergic rhinitis, unspecified: Secondary | ICD-10-CM

## 2014-03-01 DIAGNOSIS — R002 Palpitations: Secondary | ICD-10-CM

## 2014-03-01 DIAGNOSIS — F411 Generalized anxiety disorder: Secondary | ICD-10-CM

## 2014-03-01 MED ORDER — AZELASTINE HCL 0.1 % NA SOLN: 2.0000 | Freq: Two times a day (BID) | NASAL | Status: DC

## 2014-03-01 MED ORDER — METOPROLOL TARTRATE 25 MG PO TABS: 25.0000 mg | ORAL_TABLET | Freq: Two times a day (BID) | ORAL | Status: DC

## 2014-03-01 MED ORDER — BUSPIRONE HCL 10 MG PO TABS: ORAL_TABLET | ORAL | Status: DC

## 2014-03-01 MED ORDER — FLUTICASONE PROPIONATE 50 MCG/ACT NA SUSP: 2.0000 | Freq: Every day | NASAL | Status: DC

## 2014-03-01 NOTE — Assessment & Plan Note (Signed)
Stable - continue same dose metoprolol.

## 2014-03-01 NOTE — Progress Notes (Signed)
Pre visit review using our clinic review tool, if applicable. No additional management support is needed unless otherwise documented below in the visit note. 

## 2014-03-01 NOTE — Assessment & Plan Note (Signed)
Patient able to transition off of Effexor. Patient reports taking Effexor for last 6-7 years. She is currently on fluoxetine 10 mg but having intermittent breakthrough anxiety symptoms. We discussed pros and cons of taking medication long-term. Trial of BuSpar 10 mg one half tablet twice daily for 7 days then full tablet twice daily as tolerated.  Buspar may also help her GI symptoms (IBS)

## 2014-03-01 NOTE — Progress Notes (Signed)
Subjective:    Patient ID: [name], female    DOB: [birth year], 73 y.o.   MRN: [id number]  HPI  73 year old white female with history of generalized anxiety disorder, mild chronic ulcerative colitis, and gastroesophageal reflux disease for routine followup. Interval medical history- she had bout of influenza and subsequent bronchitis in December of 2014. She was treated with course of Levaquin. She denies any specific intolerance but felt antibiotic was too strong.  She was later seen by Dr. Anitra Lauth and treated with prednisone taper. It took 3-4 weeks for her symptoms to completely resolved.  She denies persistent cough.  Anxiety disorder-patient was able to taper off Effexor and transitioned to fluoxetine 10 mg as directed. Patient has been on this regimen for approximately one month. She has felt the need to take low dose Effexor on 2 occasions.  Palpitations - stable.  Allergic rhinitis - she has seasonal allergies.  Worse with exposure to tree pollen. Review of Systems Negative for weight change, chronic mild fatigue    Past Medical History  Diagnosis Date  . Arthritis     osteoarthritis, hands  . Anxiety     Effexor helps--psychiatrist Dr Toy Care  . Chronic headache   . Mild chronic ulcerative colitis   . Atypical chest pain     EF 86%, breast attenuation, no significant ischemia  . GERD (gastroesophageal reflux disease) 11/2009    EGD: Dr. Sharlett Iles: erosive stricture dilated  . Anxiety   . Migraines   . 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 Osteoporosis 02/2012    t score -2.5 spine  . Palpitations     History   Social History  . Marital Status: Married    Spouse Name: N/A    Number of Children: N/A  . Years of Education: N/A   Occupational History  . Not on file.   Social History Main Topics  . Smoking status: Former Smoker -- 1.00 packs/day for 10 years    Types: Cigarettes    Quit date: 12/15/1988  . Smokeless tobacco:  Never Used     Comment: quit in the 1980's  . Alcohol Use: Yes     Comment: 1 glass every few days  . Drug Use: No  . Sexual Activity: No     Comment: HYST   Other Topics Concern  . Not on file   Social History Narrative   Married, mother of one.  Very active. Regular exercise  -- yes.  Quit smoking in 1990.  She monitors her diet well.  A glass of wine almost every night.    Past Surgical History  Procedure Laterality Date  . Laser vaporization of upper vagina  1998  . Vaginal hysterectomy  1978  . Colposcopy    . Birth mark removed    . Augmentation mammaplasty    . Nm myocar perf wall motion  01/24/2008    EF 86% neg ischemia    Family History  Problem Relation Age of Onset  . Heart disease Mother   . Heart failure Mother   . Breast cancer Mother 55  . Cancer Father     prostate  . Inflammatory bowel disease Sister   . Diabetes Maternal Aunt   . Cancer Paternal Grandfather     colon    Allergies  Allergen Reactions  . Levaquin [Levofloxacin In D5w] Nausea Only    Dizziness and tremulousness    Current Outpatient Prescriptions on File Prior to Visit  Medication  Sig Dispense Refill  . methocarbamol (ROBAXIN) 500 MG tablet Take 1 tablet (500 mg total) by mouth 2 (two) times daily as needed.  60 tablet  1  . SUMAtriptan (IMITREX) 50 MG tablet Take 50 mg by mouth as needed. For severe headache.       No current facility-administered medications on file prior to visit.    BP 104/60  Temp(Src) 98.4 F (36.9 C) (Oral)  Ht 5\' 5"  (1.651 m)  Wt 132 lb (59.875 kg)  BMI 21.97 kg/m2    Objective:   Physical Exam  Constitutional: She is oriented to person, place, and time. She appears well-developed and well-nourished. No distress.  HENT:  Head: Normocephalic and atraumatic.  Neck: Neck supple.  Cardiovascular: Normal rate, regular rhythm and normal heart sounds.   No murmur heard. Pulmonary/Chest: Effort normal and breath sounds normal. She has no wheezes.    Musculoskeletal: She exhibits no edema.  Lymphadenopathy:    She has no cervical adenopathy.  Neurological: She is alert and oriented to person, place, and time. No cranial nerve deficit.  Skin: Skin is warm and dry.  Psychiatric: She has a normal mood and affect. Her behavior is normal.          Assessment & Plan:

## 2014-03-01 NOTE — Assessment & Plan Note (Signed)
Astelin and Flonase refilled.   

## 2014-03-07 ENCOUNTER — Other Ambulatory Visit: Payer: Self-pay

## 2014-03-07 DIAGNOSIS — Z1231 Encounter for screening mammogram for malignant neoplasm of breast: Secondary | ICD-10-CM

## 2014-03-07 NOTE — Telephone Encounter (Signed)
Done pt aware °

## 2014-03-13 ENCOUNTER — Ambulatory Visit: Payer: Medicare Other | Admitting: Internal Medicine

## 2014-03-22 ENCOUNTER — Other Ambulatory Visit: Payer: Self-pay | Admitting: Internal Medicine

## 2014-03-24 MED ORDER — SUMATRIPTAN SUCCINATE 50 MG PO TABS: 50.0000 mg | ORAL_TABLET | ORAL | Status: DC | PRN

## 2014-03-27 ENCOUNTER — Ambulatory Visit (INDEPENDENT_AMBULATORY_CARE_PROVIDER_SITE_OTHER): Payer: Medicare HMO | Admitting: Internal Medicine

## 2014-03-27 ENCOUNTER — Encounter: Payer: Self-pay | Admitting: Internal Medicine

## 2014-03-27 VITALS — BP 132/62 | HR 84 | Temp 98.4°F | Ht 65.0 in | Wt 131.0 lb

## 2014-03-27 DIAGNOSIS — R51 Headache: Secondary | ICD-10-CM

## 2014-03-27 DIAGNOSIS — F411 Generalized anxiety disorder: Secondary | ICD-10-CM

## 2014-03-27 DIAGNOSIS — R519 Headache, unspecified: Secondary | ICD-10-CM

## 2014-03-27 LAB — SEDIMENTATION RATE: SED RATE: 15 mm/h (ref 0–22)

## 2014-03-27 LAB — C-REACTIVE PROTEIN

## 2014-03-27 MED ORDER — VENLAFAXINE HCL ER 37.5 MG PO CP24: 37.5000 mg | ORAL_CAPSULE | Freq: Every day | ORAL | Status: DC

## 2014-03-27 MED ORDER — VENLAFAXINE HCL ER 75 MG PO CP24: 75.0000 mg | ORAL_CAPSULE | Freq: Every day | ORAL | Status: DC

## 2014-03-27 NOTE — Assessment & Plan Note (Signed)
Patient unable to tolerate BuSpar at secondary to symptoms of insomnia and feeling "wired". Discontinue BuSpar. Restart Effexor XR. Start at 37.5 mg then titrate 75 mg in one week.

## 2014-03-27 NOTE — Assessment & Plan Note (Signed)
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 I doubt symptoms secondary to giant cell arteritis. Check sedimentation rate and CRP. Treat with over-the-counter ibuprofen 400 mg once to twice daily as needed. If persistent or worsening symptoms, consider MRI of brain.Marland Kitchen

## 2014-03-27 NOTE — Progress Notes (Signed)
Subjective:    Patient ID: [name], female    DOB: [birth year], 73 y.o.   MRN: [id number]  HPI  73 year old white female with history of chronic anxiety disorder, headaches and allergic rhinitis for followup. Patient tried to taper off Effexor. She transitioned to low dose fluoxetine. Patient was having breakthrough symptoms and was switched to BuSpar. Fortunately, patient unable to tolerate BuSpar to insomnia and feeling "wired".  Patient also has history of migraine headaches. She reports intermittent right-sided temporal headaches. Her right eye also seems to be affected. Her symptoms are severe. She takes over-the-counter aspirin. She recently tried Imitrex with moderate improvement.  Patient denies any symptoms of jaw claudication or proximal muscle pain or weakness.   Review of Systems Negative for changes in vision.  Increase in anxiety.  tearful    Past Medical History  Diagnosis Date  . Arthritis     osteoarthritis, hands  . Anxiety     Effexor helps--psychiatrist Dr Toy Care  . Chronic headache   . Mild chronic ulcerative colitis   . Atypical chest pain     EF 86%, breast attenuation, no significant ischemia  . GERD (gastroesophageal reflux disease) 11/2009    EGD: Dr. Sharlett Iles: erosive stricture dilated  . Migraines   . 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 Osteoporosis 02/2012    t score -2.5 spine  . Palpitations     History   Social History  . Marital Status: Married    Spouse Name: N/A    Number of Children: N/A  . Years of Education: N/A   Occupational History  . Not on file.   Social History Main Topics  . Smoking status: Former Smoker -- 1.00 packs/day for 10 years    Types: Cigarettes    Quit date: 12/15/1988  . Smokeless tobacco: Never Used     Comment: quit in the 1980's  . Alcohol Use: 7.0 oz/week    14 drink(s) per week     Comment: 1 glass every few days  . Drug Use: No  . Sexual Activity: No   Comment: HYST   Other Topics Concern  . Not on file   Social History Narrative   Married, mother of one.     Very active. Regular exercise  -- yes.  Quit smoking in 1990.     She monitors her diet well.     1-2 glasses of wine almost every night.    Past Surgical History  Procedure Laterality Date  . Laser vaporization of upper vagina  1998  . Vaginal hysterectomy  1978  . Colposcopy    . Birth mark removed    . Augmentation mammaplasty    . Nm myocar perf wall motion  01/24/2008    EF 86% neg ischemia    Family History  Problem Relation Age of Onset  . Heart disease Mother   . Heart failure Mother   . Breast cancer Mother 31  . Cancer Father     prostate  . Inflammatory bowel disease Sister   . Diabetes Maternal Aunt   . Cancer Paternal Grandfather     colon    Allergies  Allergen Reactions  . Levaquin [Levofloxacin In D5w] Nausea Only    Dizziness and tremulousness    Current Outpatient Prescriptions on File Prior to Visit  Medication Sig Dispense Refill  . azelastine (ASTELIN) 137 MCG/SPRAY nasal spray Place 2 sprays into both nostrils 2 (two) times daily. Use in each nostril  as directed  30 mL  5  . fluticasone (FLONASE) 50 MCG/ACT nasal spray Place 2 sprays into both nostrils daily.  16 g  5  . methocarbamol (ROBAXIN) 500 MG tablet Take 1 tablet (500 mg total) by mouth 2 (two) times daily as needed.  60 tablet  1  . metoprolol tartrate (LOPRESSOR) 25 MG tablet Take 1 tablet (25 mg total) by mouth 2 (two) times daily.  180 tablet  1  . SUMAtriptan (IMITREX) 50 MG tablet Take 1 tablet (50 mg total) by mouth as needed. For severe headache.  10 tablet  3   No current facility-administered medications on file prior to visit.    BP 132/62  Pulse 84  Temp(Src) 98.4 F (36.9 C) (Oral)  Ht 5\' 5"  (1.651 m)  Wt 131 lb (59.421 kg)  BMI 21.80 kg/m2    Objective:   Physical Exam  Constitutional: She is oriented to person, place, and time. She appears  well-developed and well-nourished. No distress.  HENT:  Head: Normocephalic and atraumatic.  Right Ear: External ear normal.  Left Ear: External ear normal.  Mouth/Throat: Oropharynx is clear and moist.  No scalp tenderness  Eyes: Conjunctivae and EOM are normal. Pupils are equal, round, and reactive to light.  The defects in peripheral vision  Neck: Neck supple.  Cardiovascular: Normal rate, regular rhythm and normal heart sounds.   Pulmonary/Chest: Effort normal and breath sounds normal. She has no wheezes.  Lymphadenopathy:    She has no cervical adenopathy.  Neurological: She is alert and oriented to person, place, and time. She has normal reflexes. She displays normal reflexes. No cranial nerve deficit. She exhibits normal muscle tone.  Skin: Skin is warm and dry.  Psychiatric: She has a normal mood and affect. Her behavior is normal.          Assessment & Plan:

## 2014-03-27 NOTE — Progress Notes (Signed)
Pre visit review using our clinic review tool, if applicable. No additional management support is needed unless otherwise documented below in the visit note. 

## 2014-04-20 ENCOUNTER — Encounter: Payer: Self-pay | Admitting: Family Medicine

## 2014-04-20 ENCOUNTER — Ambulatory Visit (INDEPENDENT_AMBULATORY_CARE_PROVIDER_SITE_OTHER): Payer: Medicare HMO | Admitting: Family Medicine

## 2014-04-20 VITALS — BP 120/68 | HR 62 | Temp 97.9°F | Wt 133.0 lb

## 2014-04-20 DIAGNOSIS — L723 Sebaceous cyst: Secondary | ICD-10-CM

## 2014-04-20 DIAGNOSIS — M67449 Ganglion, unspecified hand: Secondary | ICD-10-CM

## 2014-04-20 MED ORDER — CEPHALEXIN 500 MG PO CAPS: 500.0000 mg | ORAL_CAPSULE | Freq: Three times a day (TID) | ORAL | Status: DC

## 2014-04-20 NOTE — Progress Notes (Signed)
   Subjective:    Patient ID: [name], female    DOB: 1941-02-12, 73 y.o.   MRN: [id number]  HPI Patient seen with recent superficial cystic type swelling dorsum right fifth digit distal to the DIP joint. She noticed about 2 weeks ago some cystic swelling that was most nonpainful. She used needle and popped this and got out some clear drainage. Some subsequently developed some redness and swelling and pain. This gradually improved with soaks.  she noticed some recurrence of cystic swelling few days ago and again tried to pop with a needle. She has only mild irritation this time.   Review of Systems  Constitutional: Negative for fever and chills.       Objective:   Physical Exam  Constitutional: She appears well-developed and well-nourished.  Cardiovascular: Normal rate.   Skin:  Patient has small crusted area just distal to the DIP joint right fifth digit. 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 She has only some minimal erythema and no warmth or and tenderness involving the distal aspect of right fifth digit          Assessment & Plan:  Suspect recent mucous cyst right fifth digit. She drained this on her own. She does not have any significant cellulitis changes at this time. Start Keflex 500 mg 3 times a day for 7 days if she has any recurrent redness, warmth, or drainage. Followup for any recurrent cystic swelling

## 2014-04-20 NOTE — Progress Notes (Signed)
Pre visit review using our clinic review tool, if applicable. No additional management support is needed unless otherwise documented below in the visit note. 

## 2014-04-21 ENCOUNTER — Ambulatory Visit: Payer: Medicare HMO | Admitting: Physician Assistant

## 2014-04-26 ENCOUNTER — Encounter: Payer: Self-pay | Admitting: Internal Medicine

## 2014-04-26 ENCOUNTER — Ambulatory Visit (INDEPENDENT_AMBULATORY_CARE_PROVIDER_SITE_OTHER): Payer: Medicare HMO | Admitting: Internal Medicine

## 2014-04-26 VITALS — BP 122/60 | Temp 98.7°F | Ht 65.0 in | Wt 130.0 lb

## 2014-04-26 DIAGNOSIS — R51 Headache: Secondary | ICD-10-CM

## 2014-04-26 DIAGNOSIS — M6740 Ganglion, unspecified site: Secondary | ICD-10-CM

## 2014-04-26 DIAGNOSIS — IMO0002 Reserved for concepts with insufficient information to code with codable children: Secondary | ICD-10-CM

## 2014-04-26 NOTE — Progress Notes (Signed)
Subjective:    Patient ID: [name], female    DOB: 07-29-1941, 73 y.o.   MRN: [id number]  HPI  73 year old white female recently seen by Dr. Elease Hashimoto for mucous cyst of right fifth digit (DIP joint) for followup.  She noticed a cystic swelling 2 weeks ago. She uses needle to pop her cyst and noted some clear drainage. She was prescribed antibiotics but she never took medication. She reports her redness and pain improved with Epsom salt soaks.  Right-sided headache-patient reports she is having persistent symptoms. Her headaches are usually exertional. They occur soon after exercising 3-4 times per week. Her symptoms are not as severe as within the last couple of months but she reports her headache severity has been as high as 9 out of 10.  Her right-sided headaches do not feel like her previous migraines.  She is currently using Excedrin Migraine as needed.  She denies family history of aneurysms.   Review of Systems Negative for redness of finger or pain    Past Medical History  Diagnosis Date  . Arthritis     osteoarthritis, hands  . Anxiety     Effexor helps--psychiatrist Dr Toy Care  . Chronic headache   . Mild chronic ulcerative colitis   . Atypical chest pain     EF 86%, breast attenuation, no significant ischemia  . GERD (gastroesophageal reflux disease) 11/2009    EGD: Dr. Sharlett Iles: erosive stricture dilated  . Migraines   . 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 Osteoporosis 02/2012    t score -2.5 spine  . Palpitations     History   Social History  . Marital Status: Married    Spouse Name: N/A    Number of Children: N/A  . Years of Education: N/A   Occupational History  . Not on file.   Social History Main Topics  . Smoking status: Former Smoker -- 1.00 packs/day for 10 years    Types: Cigarettes    Quit date: 12/15/1988  . Smokeless tobacco: Never Used     Comment: quit in the 1980's  . Alcohol Use: 7.0 oz/week    14  drink(s) per week     Comment: 1 glass every few days  . Drug Use: No  . Sexual Activity: No     Comment: HYST   Other Topics Concern  . Not on file   Social History Narrative   Married, mother of one.     Very active. Regular exercise  -- yes.  Quit smoking in 1990.     She monitors her diet well.     1-2 glasses of wine almost every night.    Past Surgical History  Procedure Laterality Date  . Laser vaporization of upper vagina  1998  . Vaginal hysterectomy  1978  . Colposcopy    . Birth mark removed    . Augmentation mammaplasty    . Nm myocar perf wall motion  01/24/2008    EF 86% neg ischemia    Family History  Problem Relation Age of Onset  . Heart disease Mother   . Heart failure Mother   . Breast cancer Mother 51  . Cancer Father     prostate  . Inflammatory bowel disease Sister   . Diabetes Maternal Aunt   . Cancer Paternal Grandfather     colon    Allergies  Allergen Reactions  . Levaquin [Levofloxacin In D5w] Nausea Only    Dizziness and tremulousness  Current Outpatient Prescriptions on File Prior to Visit  Medication Sig Dispense Refill  . azelastine (ASTELIN) 137 MCG/SPRAY nasal spray Place 2 sprays into both nostrils 2 (two) times daily. Use in each nostril as directed  30 mL  5  . fluticasone (FLONASE) 50 MCG/ACT nasal spray Place 2 sprays into both nostrils daily.  16 g  5  . methocarbamol (ROBAXIN) 500 MG tablet Take 1 tablet (500 mg total) by mouth 2 (two) times daily as needed.  60 tablet  1  . metoprolol tartrate (LOPRESSOR) 25 MG tablet Take 1 tablet (25 mg total) by mouth 2 (two) times daily.  180 tablet  1  . SUMAtriptan (IMITREX) 50 MG tablet Take 1 tablet (50 mg total) by mouth as needed. For severe headache.  10 tablet  3  . venlafaxine XR (EFFEXOR-XR) 75 MG 24 hr capsule Take 1 capsule (75 mg total) by mouth daily with breakfast.  90 capsule  1  . cephALEXin (KEFLEX) 500 MG capsule Take 1 capsule (500 mg total) by mouth 3 (three)  times daily.  21 capsule  0   No current facility-administered medications on file prior to visit.    BP 122/60  Temp(Src) 98.7 F (37.1 C) (Oral)  Ht 5\' 5"  (1.651 m)  Wt 130 lb (58.968 kg)  BMI 21.63 kg/m2    Objective:   Physical Exam  Constitutional: She is oriented to person, place, and time. She appears well-developed and well-nourished. No distress.  HENT:  Head: Normocephalic and atraumatic.  Right Ear: External ear normal.  Left Ear: External ear normal.  Cardiovascular: Normal rate, regular rhythm and normal heart sounds.   No murmur heard. Pulmonary/Chest: Effort normal and breath sounds normal. She has no wheezes.  Abdominal: Soft. Bowel sounds are normal.  Musculoskeletal: She exhibits no edema.  Neurological: She is alert and oriented to person, place, and time. No cranial nerve deficit.  Skin:  Healed cyst right 5th digit DIP joint.  Patient has Heberden's nodes predominantly on the right hand.          Assessment & Plan:

## 2014-04-26 NOTE — Assessment & Plan Note (Signed)
Patient has healing mucous cyst of DIP joint of fifth digit of right hand. The surrounding skin does not appear infected. Continue local care.

## 2014-04-26 NOTE — Assessment & Plan Note (Signed)
Patient reports worsening headaches are associated with exertion. Her symptoms concerning for possible brain aneurysm. Arrange CT Angiogram of Brain.

## 2014-05-01 ENCOUNTER — Telehealth: Payer: Self-pay | Admitting: Internal Medicine

## 2014-05-02 ENCOUNTER — Telehealth: Payer: Self-pay

## 2014-05-02 ENCOUNTER — Other Ambulatory Visit: Payer: Self-pay | Admitting: Internal Medicine

## 2014-05-02 ENCOUNTER — Other Ambulatory Visit (INDEPENDENT_AMBULATORY_CARE_PROVIDER_SITE_OTHER): Payer: Medicare HMO

## 2014-05-02 DIAGNOSIS — R5383 Other fatigue: Principal | ICD-10-CM

## 2014-05-02 DIAGNOSIS — R5381 Other malaise: Secondary | ICD-10-CM

## 2014-05-02 DIAGNOSIS — R51 Headache: Secondary | ICD-10-CM

## 2014-05-02 LAB — BASIC METABOLIC PANEL
BUN: 13 mg/dL (ref 6–23)
CO2: 26 mEq/L (ref 19–32)
Calcium: 9.1 mg/dL (ref 8.4–10.5)
Chloride: 104 mEq/L (ref 96–112)
Creatinine, Ser: 0.7 mg/dL (ref 0.4–1.2)
GFR: 91.9 mL/min (ref >60.00)
Glucose, Bld: 94 mg/dL (ref 70–99)
POTASSIUM: 4.2 meq/L (ref 3.5–5.1)
SODIUM: 137 meq/L (ref 135–145)

## 2014-05-02 NOTE — Telephone Encounter (Signed)
Message copied by Colleen Can on Tue May 02, 2014  9:52 AM ------      Message from: Rosine Abe      Created: Mon May 01, 2014  1:01 PM      Regarding: FW: pt need a bun and  creatine        Please arrange BUN and Cr before CT angio.            RY      ----- Message -----         From: Carney Bern         Sent: 05/01/2014  11:32 AM           To: Scarlette Calico, CMA, Doe-Hyun Kyra Searles, DO      Subject: pt need a bun and  creatine                              Pt scheduled for 05-04-2014@2 :30pm : CT ANGIO HEAD W/CM &/OR WO CM pt need an order for labs for a bun and creatine per rose @ ct  . I called pt to inform that she need to come in an have this done and informed pt of her appointment time and date . Please call pt when this order is out in thank you        ------

## 2014-05-02 NOTE — Telephone Encounter (Signed)
Pt will come in today for labs  

## 2014-05-04 ENCOUNTER — Ambulatory Visit (INDEPENDENT_AMBULATORY_CARE_PROVIDER_SITE_OTHER)
Admission: RE | Admit: 2014-05-04 | Discharge: 2014-05-04 | Disposition: A | Discharge status: Home / Self Care | Payer: Medicare HMO | Source: Ambulatory Visit | Attending: Internal Medicine | Admitting: Internal Medicine

## 2014-05-04 DIAGNOSIS — R51 Headache: Secondary | ICD-10-CM

## 2014-05-04 MED ORDER — IOHEXOL 350 MG/ML SOLN
80.0000 mL | Freq: Once | INTRAVENOUS | Status: AC | PRN
  Administered 2014-05-04: 80 mL via INTRAVENOUS

## 2014-05-05 ENCOUNTER — Ambulatory Visit: Payer: Medicare HMO | Admitting: Internal Medicine

## 2014-05-05 ENCOUNTER — Encounter: Payer: Self-pay | Admitting: Internal Medicine

## 2014-05-11 ENCOUNTER — Other Ambulatory Visit: Payer: Self-pay | Admitting: *Deleted

## 2014-05-11 MED ORDER — TOPIRAMATE 50 MG PO TABS: ORAL_TABLET | ORAL | Status: DC

## 2014-05-24 ENCOUNTER — Ambulatory Visit (INDEPENDENT_AMBULATORY_CARE_PROVIDER_SITE_OTHER)
Admission: RE | Admit: 2014-05-24 | Discharge: 2014-05-24 | Disposition: A | Discharge status: Home / Self Care | Payer: Medicare HMO | Source: Ambulatory Visit | Attending: Internal Medicine | Admitting: Internal Medicine

## 2014-05-24 ENCOUNTER — Ambulatory Visit (INDEPENDENT_AMBULATORY_CARE_PROVIDER_SITE_OTHER): Payer: Medicare HMO | Admitting: Internal Medicine

## 2014-05-24 ENCOUNTER — Encounter: Payer: Self-pay | Admitting: Internal Medicine

## 2014-05-24 VITALS — BP 134/70 | Temp 98.0°F | Wt 130.0 lb

## 2014-05-24 DIAGNOSIS — R0781 Pleurodynia: Secondary | ICD-10-CM

## 2014-05-24 DIAGNOSIS — M25551 Pain in right hip: Secondary | ICD-10-CM

## 2014-05-24 DIAGNOSIS — M25579 Pain in unspecified ankle and joints of unspecified foot: Secondary | ICD-10-CM

## 2014-05-24 DIAGNOSIS — M25571 Pain in right ankle and joints of right foot: Secondary | ICD-10-CM

## 2014-05-24 DIAGNOSIS — M810 Age-related osteoporosis without current pathological fracture: Secondary | ICD-10-CM

## 2014-05-24 DIAGNOSIS — R079 Chest pain, unspecified: Secondary | ICD-10-CM

## 2014-05-24 DIAGNOSIS — M25559 Pain in unspecified hip: Secondary | ICD-10-CM

## 2014-05-24 NOTE — Assessment & Plan Note (Signed)
73 year old white female with history of osteoporosis recently suffered fall. She has tenderness of lateral upper right ribs. I doubt fracture. Obtain x-ray.

## 2014-05-24 NOTE — Assessment & Plan Note (Signed)
Patient with significant tenderness on exam. She has difficulty weightbearing. Patient may have fractured her right ankle. Obtain right ankle x-ray. Refer to orthopedics for urgent evaluation.

## 2014-05-24 NOTE — Progress Notes (Signed)
Pre visit review using our clinic review tool, if applicable. No additional management support is needed unless otherwise documented below in the visit note. 

## 2014-05-24 NOTE — Progress Notes (Signed)
Subjective:    Patient ID: [name], female    DOB: February 21, 1941, 73 y.o.   MRN: [id number]  Fall  73 year old white female with history of chronic headache and gastroesophageal reflux disease presents today after suffering fall yesterday. She was in her backyard and draining water from her pergola. Patient fell backwards hitting her right ribs against patio furniture. She also hit her right hip and injured her right ankle. Patient has difficulty with weightbearing on right ankle.    Lateral right rib area is tender to touch. She denies any severe pain with deep inhalation.  Right lateral pelvic bone is tender but she denies any severe pain in the right hip joint.  She also hit her right side of her head.  She denies headache, dizziness, nausea or visual changes.  She has history of osteoporosis. Her gynecologist performed bone density scan in March of 2013. He did discuss possibly trying Prolia because of her history of gastroesophageal reflux disease but therapy was never started.    Review of Systems Negative for pain with deep inhalation.  No headache, dizziness, nausea or visual changes    Past Medical History  Diagnosis Date  . Arthritis     osteoarthritis, hands  . Anxiety     Effexor helps--psychiatrist Dr Toy Care  . Chronic headache   . Mild chronic ulcerative colitis   . Atypical chest pain     EF 86%, breast attenuation, no significant ischemia  . GERD (gastroesophageal reflux disease) 11/2009    EGD: Dr. Sharlett Iles: erosive stricture dilated  . Migraines   . 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 Osteoporosis 02/2012    t score -2.5 spine  . Palpitations     History   Social History  . Marital Status: Married    Spouse Name: N/A    Number of Children: N/A  . Years of Education: N/A   Occupational History  . Not on file.   Social History Main Topics  . Smoking status: Former Smoker -- 1.00 packs/day for 10 years    Types:  Cigarettes    Quit date: 12/15/1988  . Smokeless tobacco: Never Used     Comment: quit in the 1980's  . Alcohol Use: 7.0 oz/week    14 drink(s) per week     Comment: 1 glass every few days  . Drug Use: No  . Sexual Activity: No     Comment: HYST   Other Topics Concern  . Not on file   Social History Narrative   Married, mother of one.     Very active. Regular exercise  -- yes.  Quit smoking in 1990.     She monitors her diet well.     1-2 glasses of wine almost every night.    Past Surgical History  Procedure Laterality Date  . Laser vaporization of upper vagina  1998  . Vaginal hysterectomy  1978  . Colposcopy    . Birth mark removed    . Augmentation mammaplasty    . Nm myocar perf wall motion  01/24/2008    EF 86% neg ischemia    Family History  Problem Relation Age of Onset  . Heart disease Mother   . Heart failure Mother   . Breast cancer Mother 103  . Cancer Father     prostate  . Inflammatory bowel disease Sister   . Diabetes Maternal Aunt   . Cancer Paternal Grandfather     colon  Allergies  Allergen Reactions  . Levaquin [Levofloxacin In D5w] Nausea Only    Dizziness and tremulousness    Current Outpatient Prescriptions on File Prior to Visit  Medication Sig Dispense Refill  . azelastine (ASTELIN) 137 MCG/SPRAY nasal spray Place 2 sprays into both nostrils 2 (two) times daily. Use in each nostril as directed  30 mL  5  . fluticasone (FLONASE) 50 MCG/ACT nasal spray Place 2 sprays into both nostrils daily.  16 g  5  . methocarbamol (ROBAXIN) 500 MG tablet Take 1 tablet (500 mg total) by mouth 2 (two) times daily as needed.  60 tablet  1  . metoprolol tartrate (LOPRESSOR) 25 MG tablet Take 1 tablet (25 mg total) by mouth 2 (two) times daily.  180 tablet  1  . SUMAtriptan (IMITREX) 50 MG tablet Take 1 tablet (50 mg total) by mouth as needed. For severe headache.  10 tablet  3  . topiramate (TOPAMAX) 50 MG tablet 1/2 tablet at bedtime for 1 week then 1  tablet at bedtime  30 tablet  2  . venlafaxine XR (EFFEXOR-XR) 75 MG 24 hr capsule Take 1 capsule (75 mg total) by mouth daily with breakfast.  90 capsule  1   No current facility-administered medications on file prior to visit.    BP 134/70  Temp(Src) 98 F (36.7 C) (Oral)  Wt 130 lb (58.968 kg)    Objective:   Physical Exam  Constitutional: She is oriented to person, place, and time. She appears well-developed and well-nourished. No distress.  HENT:  Head: Normocephalic and atraumatic.  Right Ear: External ear normal.  Left Ear: External ear normal.  Uvula is midline  Eyes: Conjunctivae and EOM are normal. Pupils are equal, round, and reactive to light.  Cardiovascular: Normal rate, regular rhythm and normal heart sounds.   No murmur heard. Pulmonary/Chest: Effort normal and breath sounds normal. She has no wheezes.  Right lateral rib pain - 4/5th ribs  Musculoskeletal: She exhibits no edema.  Right ankle - mild lateral swelling. Significant pain with inversion and translation of right ankle  Neurological: She is alert and oriented to person, place, and time. No cranial nerve deficit.  Skin: Skin is warm and dry.  Psychiatric: She has a normal mood and affect. Her behavior is normal.          Assessment & Plan:

## 2014-05-24 NOTE — Assessment & Plan Note (Signed)
Patient has history of osteoporosis. Her last bone density scan was completed by her gynecologist in 03/09/2012. Her T score was -2.5 in her spine.  Patient is a poor candidate for oral bisphosphonates secondary to history of esophagitis/GERD.  We discussed starting prolia injections.  We will further discuss at next follow up visit.

## 2014-05-24 NOTE — Assessment & Plan Note (Signed)
Unable to elicit pain with compression of the pelvis. She has tenderness of lateral aspect of right pelvis.  I suspect her symptoms secondary to contusion.  Obtain right hip x ray.

## 2014-06-01 ENCOUNTER — Encounter: Payer: Self-pay | Admitting: Internal Medicine

## 2014-06-02 ENCOUNTER — Ambulatory Visit: Payer: Medicare HMO | Admitting: Family Medicine

## 2014-06-26 ENCOUNTER — Encounter: Payer: Self-pay | Admitting: Family Medicine

## 2014-06-26 ENCOUNTER — Ambulatory Visit (INDEPENDENT_AMBULATORY_CARE_PROVIDER_SITE_OTHER): Payer: Medicare HMO | Admitting: Family Medicine

## 2014-06-26 VITALS — BP 126/68 | HR 71 | Temp 98.0°F | Wt 130.0 lb

## 2014-06-26 DIAGNOSIS — B029 Zoster without complications: Secondary | ICD-10-CM

## 2014-06-26 NOTE — Progress Notes (Signed)
   Subjective:    Patient ID: [name], female    DOB: 14-Jan-1941, 73 y.o.   MRN: [id number]  HPI Patient here for followup shingles. She had onset of rash around the middle of June. She initially thought this was a bite. She was seen one day later urgent care Center diagnosed with shingles. Location left lumbar area and radiating down left lower extremity and dermatome distribution. She was treated with 7 days of Valtrex. Her current pain is 5/10 severity. She has tried Lidoderm patch from someone else which helped slightly. She took some Advil and Lodine but had stomach upset with those. She has some minimal numbness. No weakness. Rash has essentially resolved. She does not have any known immune problems.  Past Medical History  Diagnosis Date  . Arthritis     osteoarthritis, hands  . Anxiety     Effexor helps--psychiatrist Dr Toy Care  . Chronic headache   . Mild chronic ulcerative colitis   . Atypical chest pain     EF 86%, breast attenuation, no significant ischemia  . GERD (gastroesophageal reflux disease) 11/2009    EGD: Dr. Sharlett Iles: erosive stricture dilated  . Migraines   . 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 Osteoporosis 02/2012    t score -2.5 spine  . Palpitations    Past Surgical History  Procedure Laterality Date  . Laser vaporization of upper vagina  1998  . Vaginal hysterectomy  1978  . Colposcopy    . Birth mark removed    . Augmentation mammaplasty    . Nm myocar perf wall motion  01/24/2008    EF 86% neg ischemia    reports that she quit smoking about 25 years ago. Her smoking use included Cigarettes. She has a 10 pack-year smoking history. She has never used smokeless tobacco. She reports that she drinks about 7 ounces of alcohol per week. She reports that she does not use illicit drugs. family history includes Breast cancer (age of onset: 43) in her mother; Cancer in her father and paternal grandfather; Diabetes in her  maternal aunt; Heart disease in her mother; Heart failure in her mother; Inflammatory bowel disease in her sister. Allergies  Allergen Reactions  . Levaquin [Levofloxacin In D5w] Nausea Only    Dizziness and tremulousness      Review of Systems  Constitutional: Negative for fever, chills, appetite change and unexpected weight change.  Respiratory: Negative for shortness of breath.   Cardiovascular: Negative for chest pain.  Musculoskeletal: Positive for back pain.  Neurological: Positive for numbness. Negative for weakness.  Hematological: Negative for adenopathy.       Objective:   Physical Exam  Constitutional: She appears well-developed and well-nourished. No distress.  Cardiovascular: Normal rate and regular rhythm.   Pulmonary/Chest: Effort normal and breath sounds normal. No respiratory distress. She has no wheezes. She has no rales.  Musculoskeletal: She exhibits no edema.  Skin:  Rash has healed left ant thigh and left lumbar back.          Assessment & Plan:  Recent shingles. Lumbar and left lower extremity distribution. Clinically improving, though slowly. We discussed further management for pain with options including Lyrica or gabapentin and at this point she wishes to wait. We suspect this will continue to heal. She'll continue Lidoderm patches as needed

## 2014-06-26 NOTE — Patient Instructions (Signed)
Shingles °Shingles (herpes zoster) is an infection that is caused by the same virus that causes chickenpox (varicella). The infection causes a painful skin rash and fluid-filled blisters, which eventually break open, crust over, and heal. It may occur in any area of the body, but it usually affects only one side of the body or face. The pain of shingles usually lasts about 1 month. However, some people with shingles may develop long-term (chronic) pain in the affected area of the body. °Shingles often occurs many years after the person had chickenpox. It is more common: °· In people older than 50 years. °· In people with weakened immune systems, such as those with HIV, AIDS, or cancer. °· In people taking medicines that weaken the immune system, such as transplant medicines. °· In people under great stress. °CAUSES  °Shingles is caused by the varicella zoster virus (VZV), which also causes chickenpox. After a person is infected with the virus, it can remain in the person's body for years in an inactive state (dormant). To cause shingles, the virus reactivates and breaks out as an infection in a nerve root. °The virus can be spread from person to person (contagious) through contact with open blisters of the shingles rash. It will only spread to people who have not had chickenpox. When these people are exposed to the virus, they may develop chickenpox. They will not develop shingles. Once the blisters scab over, the person is no longer contagious and cannot spread the virus to others. °SYMPTOMS  °Shingles shows up in stages. The initial symptoms may be pain, itching, and tingling in an area of the skin. This pain is usually described as burning, stabbing, or throbbing. In a few days or weeks, a painful red rash will appear in the area where the pain, itching, and tingling were felt. The rash is usually on one side of the body in a band or belt-like pattern. Then, the rash usually turns into fluid-filled blisters. They  will scab over and dry up in approximately 2-3 weeks. °Flu-like symptoms may also occur with the initial symptoms, the rash, or the blisters. These may include: °· Fever. °· Chills. °· Headache. °· Upset stomach. °DIAGNOSIS  °Your caregiver will perform a skin exam to diagnose shingles. Skin scrapings or fluid samples may also be taken from the blisters. This sample will be examined under a microscope or sent to a lab for further testing. °TREATMENT  °There is no specific cure for shingles. Your caregiver will likely prescribe medicines to help you manage the pain, recover faster, and avoid long-term problems. This may include antiviral drugs, anti-inflammatory drugs, and pain medicines. °HOME CARE INSTRUCTIONS  °· Take a cool bath or apply cool compresses to the area of the rash or blisters as directed. This may help with the pain and itching.   °· Only take over-the-counter or prescription medicines as directed by your caregiver.   °· Rest as directed by your caregiver. °· Keep your rash and blisters clean with mild soap and cool water or as directed by your caregiver.  °· Do not pick your blisters or scratch your rash. Apply an anti-itch cream or numbing creams to the affected area as directed by your caregiver. °· Keep your shingles rash covered with a loose bandage (dressing). °· Avoid skin contact with: °¨ Babies.   °¨ Pregnant women.   °¨ Children with eczema.   °¨ Elderly people with transplants.   °¨ People with chronic illnesses, such as leukemia or AIDS.   °· Wear loose-fitting clothing to help ease the   pain of material rubbing against the rash. °· Keep all follow-up appointments with your caregiver. If the area involved is on your face, you may receive a referral for follow-up to a specialist, such as an eye doctor (ophthalmologist) or an ear, nose, and throat (ENT) doctor. Keeping all follow-up appointments will help you avoid eye complications, chronic pain, or disability.   °SEEK IMMEDIATE MEDICAL  CARE IF:  °· You have facial pain, pain around the eye area, or loss of feeling on one side of your face. °· You have ear pain or ringing in your ear. °· You have loss of taste. °· Your pain is not relieved with prescribed medicines.   °· Your redness or swelling spreads.   °· You have more pain and swelling.  °· Your condition is worsening or has changed.   °· You have a fever or persistent symptoms for more than 2-3 days. °· You have a fever and your symptoms suddenly get worse. °MAKE SURE YOU: °· Understand these instructions. °· Will watch your condition. °· Will get help right away if you are not doing well or get worse. °Document Released: 12/01/2005 Document Revised: 08/25/2012 Document Reviewed: 07/15/2012 °Exit[employer]® Patient Information ©2015 [employer]. This information is not intended to replace advice given to you by your health care provider. Make sure you discuss any questions you have with your health care provider. ° °

## 2014-06-26 NOTE — Progress Notes (Signed)
Pre visit review using our clinic review tool, if applicable. No additional management support is needed unless otherwise documented below in the visit note. 

## 2014-09-29 ENCOUNTER — Other Ambulatory Visit: Payer: Self-pay

## 2014-10-02 ENCOUNTER — Encounter: Payer: Self-pay | Admitting: Family Medicine

## 2014-10-02 ENCOUNTER — Ambulatory Visit (INDEPENDENT_AMBULATORY_CARE_PROVIDER_SITE_OTHER): Payer: Medicare HMO | Admitting: Family Medicine

## 2014-10-02 VITALS — BP 120/68 | HR 62 | Temp 97.4°F | Wt 129.0 lb

## 2014-10-02 DIAGNOSIS — K219 Gastro-esophageal reflux disease without esophagitis: Secondary | ICD-10-CM

## 2014-10-02 DIAGNOSIS — Z23 Encounter for immunization: Secondary | ICD-10-CM

## 2014-10-02 DIAGNOSIS — R1319 Other dysphagia: Secondary | ICD-10-CM

## 2014-10-02 DIAGNOSIS — G4700 Insomnia, unspecified: Secondary | ICD-10-CM

## 2014-10-02 NOTE — Progress Notes (Signed)
Subjective:    Patient ID: [name], female    DOB: Jun 30, 1941, 73 y.o.   MRN: [id number]  HPI Patient seen today for several issues as follows  She has history of GERD. She had EGD in December 2010 with esophagitis and she underwent esophageal dilatation at that time. She's had recurrent bouts of reflux and generally takes about 7 days of Nexium per flare up which helps. She's not had any pain with swallowing. Her weight and appetite have been stable. She does have episodes about once every few weeks where she has some difficulty with swallowing. In the past each time she has treated with medication like Nexium symptoms have improved. She drinks about 2 cups of coffee each morning but no other caffeine use.  consumes one glass of wine per day.  Chronic insomnia. She thinks this is stress related. Has previously taken Benadryl which helps. Denies any depressive symptoms.  Past Medical History  Diagnosis Date  . Arthritis     osteoarthritis, hands  . Anxiety     Effexor helps--psychiatrist Dr Toy Care  . Chronic headache   . Mild chronic ulcerative colitis   . Atypical chest pain     EF 86%, breast attenuation, no significant ischemia  . GERD (gastroesophageal reflux disease) 11/2009    EGD: Dr. Sharlett Iles: erosive stricture dilated  . Migraines   . 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 Osteoporosis 02/2012    t score -2.5 spine  . Palpitations    Past Surgical History  Procedure Laterality Date  . Laser vaporization of upper vagina  1998  . Vaginal hysterectomy  1978  . Colposcopy    . Birth mark removed    . Augmentation mammaplasty    . Nm myocar perf wall motion  01/24/2008    EF 86% neg ischemia    reports that she quit smoking about 25 years ago. Her smoking use included Cigarettes. She has a 10 pack-year smoking history. She has never used smokeless tobacco. She reports that she drinks about 7 ounces of alcohol per week. She reports that  she does not use illicit drugs. family history includes Breast cancer (age of onset: 56) in her mother; Cancer in her father and paternal grandfather; Diabetes in her maternal aunt; Heart disease in her mother; Heart failure in her mother; Inflammatory bowel disease in her sister. Allergies  Allergen Reactions  . Levaquin [Levofloxacin In D5w] Nausea Only    Dizziness and tremulousness      Review of Systems  Constitutional: Negative for fever, chills, appetite change and unexpected weight change.  HENT: Positive for trouble swallowing.   Respiratory: Negative for cough and shortness of breath.   Cardiovascular: Negative for chest pain.  Gastrointestinal: Negative for nausea, vomiting and abdominal pain.  Genitourinary: Negative for dysuria.  Neurological: Negative for dizziness and headaches.  Psychiatric/Behavioral: Positive for sleep disturbance.       Objective:   Physical Exam  Constitutional: She appears well-developed and well-nourished.  Neck: Neck supple. No thyromegaly present.  Cardiovascular: Normal rate and regular rhythm.   Pulmonary/Chest: Effort normal and breath sounds normal. No respiratory distress. She has no wheezes. She has no rales.  Musculoskeletal: She exhibits no edema.  Psychiatric: She has a normal mood and affect. Her behavior is normal.          Assessment & Plan:  #1 GERD with intermittent dysphagia. She has no red flags such as appetite or weight change. We've recommended getting  back on regular Nexium for full 6-8 weeks. If symptoms not fully resolving during that time we recommended GI followup for consideration of repeat EGD. Handout given on GERD #2 insomnia. Sleep hygiene discussed. She'll continue as needed use of Benadryl. Avoid regular use of sedative hypnotics if possible  #3 health maintenance. Flu vaccine given. Prevnar 13 given.

## 2014-10-02 NOTE — Progress Notes (Signed)
Pre visit review using our clinic review tool, if applicable. No additional management support is needed unless otherwise documented below in the visit note. 

## 2014-10-02 NOTE — Patient Instructions (Signed)

## 2014-10-11 ENCOUNTER — Other Ambulatory Visit: Payer: Self-pay | Admitting: Internal Medicine

## 2014-10-14 ENCOUNTER — Other Ambulatory Visit: Payer: Self-pay | Admitting: Internal Medicine

## 2014-10-16 ENCOUNTER — Encounter: Payer: Self-pay | Admitting: Family Medicine

## 2014-11-03 ENCOUNTER — Encounter: Payer: Self-pay | Admitting: Internal Medicine

## 2014-11-03 MED ORDER — METOPROLOL TARTRATE 25 MG PO TABS: 25.0000 mg | ORAL_TABLET | Freq: Two times a day (BID) | ORAL | Status: DC

## 2014-12-11 ENCOUNTER — Other Ambulatory Visit (INDEPENDENT_AMBULATORY_CARE_PROVIDER_SITE_OTHER): Payer: Medicare HMO

## 2014-12-11 DIAGNOSIS — Z0000 Encounter for general adult medical examination without abnormal findings: Secondary | ICD-10-CM

## 2014-12-11 DIAGNOSIS — E785 Hyperlipidemia, unspecified: Secondary | ICD-10-CM

## 2014-12-11 LAB — LIPID PANEL
CHOL/HDL RATIO: 3
CHOLESTEROL: 233 mg/dL — AB (ref 0–200)
HDL: 77.1 mg/dL (ref >39.00)
LDL CALC: 144 mg/dL — AB (ref 0–99)
NonHDL: 155.9
Triglycerides: 61 mg/dL (ref 0.0–149.0)
VLDL: 12.2 mg/dL (ref 0.0–40.0)

## 2014-12-11 LAB — CBC WITH DIFFERENTIAL/PLATELET
BASOS ABS: 0 10*3/uL (ref 0.0–0.1)
Basophils Relative: 0.2 % (ref 0.0–3.0)
Eosinophils Absolute: 0.1 10*3/uL (ref 0.0–0.7)
Eosinophils Relative: 2.1 % (ref 0.0–5.0)
HEMATOCRIT: 40.6 % (ref 36.0–46.0)
Hemoglobin: 13.5 g/dL (ref 12.0–15.0)
LYMPHS ABS: 1.5 10*3/uL (ref 0.7–4.0)
Lymphocytes Relative: 34.4 % (ref 12.0–46.0)
MCHC: 33.2 g/dL (ref 30.0–36.0)
MCV: 94.7 fl (ref 78.0–100.0)
MONO ABS: 0.4 10*3/uL (ref 0.1–1.0)
Monocytes Relative: 9 % (ref 3.0–12.0)
Neutro Abs: 2.3 10*3/uL (ref 1.4–7.7)
Neutrophils Relative %: 54.3 % (ref 43.0–77.0)
PLATELETS: 201 10*3/uL (ref 150.0–400.0)
RBC: 4.29 Mil/uL (ref 3.87–5.11)
RDW: 13.1 % (ref 11.5–15.5)
WBC: 4.3 10*3/uL (ref 4.0–10.5)

## 2014-12-11 LAB — BASIC METABOLIC PANEL
BUN: 11 mg/dL (ref 6–23)
CHLORIDE: 105 meq/L (ref 96–112)
CO2: 29 meq/L (ref 19–32)
CREATININE: 0.6 mg/dL (ref 0.4–1.2)
Calcium: 9.2 mg/dL (ref 8.4–10.5)
GFR: 108.36 mL/min (ref >60.00)
Glucose, Bld: 99 mg/dL (ref 70–99)
Potassium: 5 mEq/L (ref 3.5–5.1)
Sodium: 140 mEq/L (ref 135–145)

## 2014-12-11 LAB — TSH: TSH: 1.85 u[IU]/mL (ref 0.35–4.50)

## 2014-12-11 LAB — POCT URINALYSIS DIPSTICK
Bilirubin, UA: NEGATIVE
Glucose, UA: NEGATIVE
KETONES UA: NEGATIVE
Nitrite, UA: NEGATIVE
PROTEIN UA: NEGATIVE
RBC UA: NEGATIVE
Spec Grav, UA: 1.015
UROBILINOGEN UA: 1
pH, UA: 7.5

## 2014-12-11 LAB — HEPATIC FUNCTION PANEL
ALT: 32 U/L (ref 0–35)
AST: 38 U/L — ABNORMAL HIGH (ref 0–37)
Albumin: 4.2 g/dL (ref 3.5–5.2)
Alkaline Phosphatase: 64 U/L (ref 39–117)
BILIRUBIN TOTAL: 1 mg/dL (ref 0.2–1.2)
Bilirubin, Direct: 0.1 mg/dL (ref 0.0–0.3)
Total Protein: 7.3 g/dL (ref 6.0–8.3)

## 2014-12-18 ENCOUNTER — Encounter: Payer: Medicare HMO | Admitting: Internal Medicine

## 2014-12-21 ENCOUNTER — Telehealth: Payer: Self-pay | Admitting: Internal Medicine

## 2014-12-21 DIAGNOSIS — K209 Esophagitis, unspecified without bleeding: Secondary | ICD-10-CM

## 2014-12-21 DIAGNOSIS — K219 Gastro-esophageal reflux disease without esophagitis: Secondary | ICD-10-CM

## 2014-12-21 NOTE — Telephone Encounter (Signed)
Referral placed.

## 2014-12-21 NOTE — Telephone Encounter (Signed)
Ok to refer to GI.  Dr. Hilarie Fredrickson or ok to refer to GI specialist that pt wants to see

## 2014-12-21 NOTE — Telephone Encounter (Signed)
Ok for referral?

## 2014-12-21 NOTE — Telephone Encounter (Signed)
Pt scheduled to see Dr. Shawna Orleans next week for follow up on GI issues.  Pt also requesting referral to GI specialist for consultation.

## 2014-12-27 ENCOUNTER — Ambulatory Visit: Payer: Medicare HMO | Admitting: Internal Medicine

## 2014-12-28 ENCOUNTER — Ambulatory Visit (INDEPENDENT_AMBULATORY_CARE_PROVIDER_SITE_OTHER): Payer: Medicare HMO | Admitting: Internal Medicine

## 2014-12-28 ENCOUNTER — Encounter: Payer: Self-pay | Admitting: Internal Medicine

## 2014-12-28 VITALS — BP 138/80 | HR 63 | Temp 98.0°F | Resp 18 | Ht 65.0 in | Wt 134.0 lb

## 2014-12-28 DIAGNOSIS — K219 Gastro-esophageal reflux disease without esophagitis: Secondary | ICD-10-CM

## 2014-12-28 MED ORDER — ESOMEPRAZOLE MAGNESIUM 40 MG PO CPDR: 40.0000 mg | DELAYED_RELEASE_CAPSULE | Freq: Every day | ORAL | Status: DC

## 2014-12-28 NOTE — Progress Notes (Signed)
Pre visit review using our clinic review tool, if applicable. No additional management support is needed unless otherwise documented below in the visit note. 

## 2014-12-28 NOTE — Progress Notes (Signed)
Subjective:    Patient ID: [name], female    DOB: [birth year], 74 y.o.   MRN: [id number]  HPI 74 year old patient who is seen today for general follow-up.  She has a history of gastroesophageal reflux disease and did require esophageal dilatation about 5 years ago.  Her reflux symptoms are fairly stable, but does have some breakthrough symptoms.  She is on chronic Nexium.  She takes is fasting in the morning.  Some dietary indiscretion over the holidays.  In general doing quite well  Past Medical History  Diagnosis Date  . Arthritis     osteoarthritis, hands  . Anxiety     Effexor helps--psychiatrist Dr Toy Care  . Chronic headache   . Mild chronic ulcerative colitis   . Atypical chest pain     EF 86%, breast attenuation, no significant ischemia  . GERD (gastroesophageal reflux disease) 11/2009    EGD: Dr. Sharlett Iles: erosive stricture dilated  . Migraines   . 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 Osteoporosis 02/2012    t score -2.5 spine  . Palpitations     History   Social History  . Marital Status: Married    Spouse Name: N/A    Number of Children: N/A  . Years of Education: N/A   Occupational History  . Not on file.   Social History Main Topics  . Smoking status: Former Smoker -- 1.00 packs/day for 10 years    Types: Cigarettes    Quit date: 12/15/1988  . Smokeless tobacco: Never Used     Comment: quit in the 1980's  . Alcohol Use: 7.0 oz/week    14 drink(s) per week     Comment: 1 glass every few days  . Drug Use: No  . Sexual Activity: No     Comment: HYST   Other Topics Concern  . Not on file   Social History Narrative   Married, mother of one.     Very active. Regular exercise  -- yes.  Quit smoking in 1990.     She monitors her diet well.     1-2 glasses of wine almost every night.    Past Surgical History  Procedure Laterality Date  . Laser vaporization of upper vagina  1998  . Vaginal hysterectomy  1978  .  Colposcopy    . Birth mark removed    . Augmentation mammaplasty    . Nm myocar perf wall motion  01/24/2008    EF 86% neg ischemia    Family History  Problem Relation Age of Onset  . Heart disease Mother   . Heart failure Mother   . Breast cancer Mother 73  . Cancer Father     prostate  . Inflammatory bowel disease Sister   . Diabetes Maternal Aunt   . Cancer Paternal Grandfather     colon    Allergies  Allergen Reactions  . Levaquin [Levofloxacin In D5w] Nausea Only    Dizziness and tremulousness    Current Outpatient Prescriptions on File Prior to Visit  Medication Sig Dispense Refill  . azelastine (ASTELIN) 137 MCG/SPRAY nasal spray Place 2 sprays into both nostrils 2 (two) times daily. Use in each nostril as directed 30 mL 5  . fluticasone (FLONASE) 50 MCG/ACT nasal spray Place 2 sprays into both nostrils daily. 16 g 5  . methocarbamol (ROBAXIN) 500 MG tablet Take 1 tablet (500 mg total) by mouth 2 (two) times daily as needed. 60 tablet 1  .  metoprolol tartrate (LOPRESSOR) 25 MG tablet Take 1 tablet (25 mg total) by mouth 2 (two) times daily. 180 tablet 1  . SUMAtriptan (IMITREX) 50 MG tablet Take 1 tablet (50 mg total) by mouth as needed. For severe headache. 10 tablet 3  . venlafaxine XR (EFFEXOR-XR) 75 MG 24 hr capsule TAKE ONE CAPSULE BY MOUTH ONCE DAILY WITH  BREAKFAST 90 capsule 0   No current facility-administered medications on file prior to visit.    BP 138/80 mmHg  Pulse 63  Temp(Src) 98 F (36.7 C) (Oral)  Resp 18  Ht 5\' 5"  (1.651 m)  Wt 134 lb (60.782 kg)  BMI 22.30 kg/m2  SpO2 94%      Review of Systems  Constitutional: Negative.   HENT: Negative for congestion, dental problem, hearing loss, rhinorrhea, sinus pressure, sore throat and tinnitus.   Eyes: Negative for pain, discharge and visual disturbance.  Respiratory: Negative for cough and shortness of breath.   Cardiovascular: Negative for chest pain, palpitations and leg swelling.    Gastrointestinal: Negative for nausea, vomiting, abdominal pain, diarrhea, constipation, blood in stool and abdominal distention.  Genitourinary: Negative for dysuria, urgency, frequency, hematuria, flank pain, vaginal bleeding, vaginal discharge, difficulty urinating, vaginal pain and pelvic pain.  Musculoskeletal: Negative for joint swelling, arthralgias and gait problem.  Skin: Negative for rash.  Neurological: Negative for dizziness, syncope, speech difficulty, weakness, numbness and headaches.  Hematological: Negative for adenopathy.  Psychiatric/Behavioral: Negative for behavioral problems, dysphoric mood and agitation. The patient is not nervous/anxious.        Objective:   Physical Exam  Constitutional: She is oriented to person, place, and time. She appears well-developed and well-nourished.  Repeat blood pressure 120/72  HENT:  Head: Normocephalic.  Right Ear: External ear normal.  Left Ear: External ear normal.  Mouth/Throat: Oropharynx is clear and moist.  Eyes: Conjunctivae and EOM are normal. Pupils are equal, round, and reactive to light.  Neck: Normal range of motion. Neck supple. No thyromegaly present.  Cardiovascular: Normal rate, regular rhythm, normal heart sounds and intact distal pulses.   Pulmonary/Chest: Effort normal and breath sounds normal.  Abdominal: Soft. Bowel sounds are normal. She exhibits no mass. There is no tenderness.  Musculoskeletal: Normal range of motion.  Lymphadenopathy:    She has no cervical adenopathy.  Neurological: She is alert and oriented to person, place, and time.  Skin: Skin is warm and dry. No rash noted.  Psychiatric: She has a normal mood and affect. Her behavior is normal.          Assessment & Plan:   GERD Short anxiety disorder  Medications updated Antireflux measures discussed Recheck 6 months

## 2014-12-28 NOTE — Patient Instructions (Signed)
Food Choices for Gastroesophageal Reflux Disease When you have gastroesophageal reflux disease (GERD), the foods you eat and your eating habits are very important. Choosing the right foods can help ease the discomfort of GERD. WHAT GENERAL GUIDELINES DO I NEED TO FOLLOW?  Choose fruits, vegetables, whole grains, low-fat dairy products, and low-fat meat, fish, and poultry.  Limit fats such as oils, salad dressings, butter, nuts, and avocado.  Keep a food diary to identify foods that cause symptoms.  Avoid foods that cause reflux. These may be different for different people.  Eat frequent small meals instead of three large meals each day.  Eat your meals slowly, in a relaxed setting.  Limit fried foods.  Cook foods using methods other than frying.  Avoid drinking alcohol.  Avoid drinking large amounts of liquids with your meals.  Avoid bending over or lying down until 2-3 hours after eating. WHAT FOODS ARE NOT RECOMMENDED? The following are some foods and drinks that may worsen your symptoms: Vegetables Tomatoes. Tomato juice. Tomato and spaghetti sauce. Chili peppers. Onion and garlic. Horseradish. Fruits Oranges, grapefruit, and lemon (fruit and juice). Meats High-fat meats, fish, and poultry. This includes hot dogs, ribs, ham, sausage, salami, and bacon. Dairy Whole milk and chocolate milk. Sour cream. Cream. Butter. Ice cream. Cream cheese.  Beverages Coffee and tea, with or without caffeine. Carbonated beverages or energy drinks. Condiments Hot sauce. Barbecue sauce.  Sweets/Desserts Chocolate and cocoa. Donuts. Peppermint and spearmint. Fats and Oils High-fat foods, including French fries and potato chips. Other Vinegar. Strong spices, such as black pepper, white pepper, red pepper, cayenne, curry powder, cloves, ginger, and chili powder. The items listed above may not be a complete list of foods and beverages to avoid. Contact your dietitian for more  information. Document Released: 12/01/2005 Document Revised: 12/06/2013 Document Reviewed: 10/05/2013 [employer] Patient Information 2015 [employer]. This information is not intended to replace advice given to you by your health care provider. Make sure you discuss any questions you have with your health care provider. Gastroesophageal Reflux Disease, Adult Gastroesophageal reflux disease (GERD) happens when acid from your stomach flows up into the esophagus. When acid comes in contact with the esophagus, the acid causes soreness (inflammation) in the esophagus. Over time, GERD may create small holes (ulcers) in the lining of the esophagus. CAUSES   Increased body weight. This puts pressure on the stomach, making acid rise from the stomach into the esophagus.  Smoking. This increases acid production in the stomach.  Drinking alcohol. This causes decreased pressure in the lower esophageal sphincter (valve or ring of muscle between the esophagus and stomach), allowing acid from the stomach into the esophagus.  Late evening meals and a full stomach. This increases pressure and acid production in the stomach.  A malformed lower esophageal sphincter. Sometimes, no cause is found. SYMPTOMS   Burning pain in the lower part of the mid-chest behind the breastbone and in the mid-stomach area. This may occur twice a week or more often.  Trouble swallowing.  Sore throat.  Dry cough.  Asthma-like symptoms including chest tightness, shortness of breath, or wheezing. DIAGNOSIS  Your caregiver may be able to diagnose GERD based on your symptoms. In some cases, X-rays and other tests may be done to check for complications or to check the condition of your stomach and esophagus. TREATMENT  Your caregiver may recommend over-the-counter or prescription medicines to help decrease acid production. Ask your caregiver before starting or adding any new medicines.  HOME CARE   INSTRUCTIONS   Change the  factors that you can control. Ask your caregiver for guidance concerning weight loss, quitting smoking, and alcohol consumption.  Avoid foods and drinks that make your symptoms worse, such as:  Caffeine or alcoholic drinks.  Chocolate.  Peppermint or mint flavorings.  Garlic and onions.  Spicy foods.  Citrus fruits, such as oranges, lemons, or limes.  Tomato-based foods such as sauce, chili, salsa, and pizza.  Fried and fatty foods.  Avoid lying down for the 3 hours prior to your bedtime or prior to taking a nap.  Eat small, frequent meals instead of large meals.  Wear loose-fitting clothing. Do not wear anything tight around your waist that causes pressure on your stomach.  Raise the head of your bed 6 to 8 inches with wood blocks to help you sleep. Extra pillows will not help.  Only take over-the-counter or prescription medicines for pain, discomfort, or fever as directed by your caregiver.  Do not take aspirin, ibuprofen, or other nonsteroidal anti-inflammatory drugs (NSAIDs). SEEK IMMEDIATE MEDICAL CARE IF:   You have pain in your arms, neck, jaw, teeth, or back.  Your pain increases or changes in intensity or duration.  You develop nausea, vomiting, or sweating (diaphoresis).  You develop shortness of breath, or you faint.  Your vomit is green, yellow, black, or looks like coffee grounds or blood.  Your stool is red, bloody, or black. These symptoms could be signs of other problems, such as heart disease, gastric bleeding, or esophageal bleeding. MAKE SURE YOU:   Understand these instructions.  Will watch your condition.  Will get help right away if you are not doing well or get worse. Document Released: 09/10/2005 Document Revised: 02/23/2012 Document Reviewed: 06/20/2011 [employer] Patient Information 2015 [employer]. This information is not intended to replace advice given to you by your health care provider. Make sure you discuss any questions you have  with your health care provider.  

## 2015-01-22 ENCOUNTER — Encounter: Payer: Self-pay | Admitting: Internal Medicine

## 2015-01-22 ENCOUNTER — Other Ambulatory Visit: Payer: Self-pay | Admitting: Internal Medicine

## 2015-01-22 ENCOUNTER — Ambulatory Visit (INDEPENDENT_AMBULATORY_CARE_PROVIDER_SITE_OTHER): Payer: Medicare HMO | Admitting: Internal Medicine

## 2015-01-22 VITALS — BP 110/68 | Temp 97.9°F | Ht 65.0 in | Wt 132.0 lb

## 2015-01-22 DIAGNOSIS — R102 Pelvic and perineal pain: Secondary | ICD-10-CM

## 2015-01-22 DIAGNOSIS — Z0000 Encounter for general adult medical examination without abnormal findings: Secondary | ICD-10-CM

## 2015-01-22 DIAGNOSIS — R413 Other amnesia: Secondary | ICD-10-CM

## 2015-01-22 DIAGNOSIS — R630 Anorexia: Secondary | ICD-10-CM

## 2015-01-22 NOTE — Assessment & Plan Note (Signed)
Patient has nonspecific abdominal symptoms including nausea and anorexia. She has tenderness of bilateral pelvis. Obtain transvaginal ultrasound. Check CA 125.

## 2015-01-22 NOTE — Progress Notes (Addendum)
Subjective:    Patient ID: [name], female    DOB: 1941/03/18, 74 y.o.   MRN: [id number]  HPI  74 year old white female with history of anxiety, migraine gastroesophageal reflux disease presents for Medicare wellness exam.  Medicare wellness questionnaire reviewed in detail. Patient has not had any hospitalizations or visits to the emergency room within the past year. Patient last eye exam performed by Dr. Satira Sark at [facility] ophthal[facility]. She does not have glaucoma. She does wear corrective lenses. She has not had any surgeries within the last year.  Screening for substance abuse, increased risk for falls, depression and memory loss performed. See attached form for specific details. Patient is oriented to person place and time. Patient able to spell world backwards. Patient remembered 2 out of 3 words. Patient able to draw a clock without difficulty.  Patient also complains of intermittent abdominal discomfort. Patient reports her symptoms not completely resolved after restarting Nexium 40 mg and a regular basis. She complains of nausea.  Patient also complains of loss of appetite. Patient's last Pap and pelvic was performed by her gynecologist. She denies any history of abnormal pelvic exam. She complains of pelvic discomfort.  Medical records reviewed. Her last EGD was performed in 2010 by Dr. Sharlett Iles. It is notable for esophagitis.   Review of Systems  Constitutional: Negative for activity change, appetite change and unexpected weight change.  Eyes: Negative for visual disturbance.  Respiratory: Negative for cough, chest tightness and shortness of breath.   Cardiovascular: Negative for chest pain.  Genitourinary: Negative for difficulty urinating.  Neurological: Negative for headaches.  Gastrointestinal: positive for GERD, negative for dysphagia, intermittent nausea Psych: Negative for depression or anxiety Endo:  No polyuria or polydypsia        Past Medical History    Diagnosis Date  . Arthritis     osteoarthritis, hands  . Anxiety     Effexor helps--psychiatrist Dr Toy Care  . Chronic headache   . Mild chronic ulcerative colitis   . Atypical chest pain     EF 86%, breast attenuation, no significant ischemia  . GERD (gastroesophageal reflux disease) 11/2009    EGD: Dr. Sharlett Iles: erosive stricture dilated  . Migraines   . 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 Osteoporosis 02/2012    t score -2.5 spine  . Palpitations     History   Social History  . Marital Status: Married    Spouse Name: N/A    Number of Children: N/A  . Years of Education: N/A   Occupational History  . Not on file.   Social History Main Topics  . Smoking status: Former Smoker -- 1.00 packs/day for 10 years    Types: Cigarettes    Quit date: 12/15/1988  . Smokeless tobacco: Never Used     Comment: quit in the 1980's  . Alcohol Use: 7.0 oz/week    14 drink(s) per week     Comment: 1 glass every few days  . Drug Use: No  . Sexual Activity: No     Comment: HYST   Other Topics Concern  . Not on file   Social History Narrative   Married, mother of one.     Very active. Regular exercise  -- yes.  Quit smoking in 1990.     She monitors her diet well.     1-2 glasses of wine almost every night.    Past Surgical History  Procedure Laterality Date  . Laser vaporization of  upper vagina  1998  . Vaginal hysterectomy  1978  . Colposcopy    . Birth mark removed    . Augmentation mammaplasty    . Nm myocar perf wall motion  01/24/2008    EF 86% neg ischemia    Family History  Problem Relation Age of Onset  . Heart disease Mother   . Heart failure Mother   . Breast cancer Mother 47  . Cancer Father     prostate  . Inflammatory bowel disease Sister   . Diabetes Maternal Aunt   . Cancer Paternal Grandfather     colon    Allergies  Allergen Reactions  . Levaquin [Levofloxacin In D5w] Nausea Only    Dizziness and tremulousness     Current Outpatient Prescriptions on File Prior to Visit  Medication Sig Dispense Refill  . azelastine (ASTELIN) 137 MCG/SPRAY nasal spray Place 2 sprays into both nostrils 2 (two) times daily. Use in each nostril as directed 30 mL 5  . esomeprazole (NEXIUM) 40 MG capsule Take 1 capsule (40 mg total) by mouth daily. 90 capsule 3  . fluticasone (FLONASE) 50 MCG/ACT nasal spray Place 2 sprays into both nostrils daily. 16 g 5  . metoprolol tartrate (LOPRESSOR) 25 MG tablet Take 1 tablet (25 mg total) by mouth 2 (two) times daily. 180 tablet 1  . SUMAtriptan (IMITREX) 50 MG tablet Take 1 tablet (50 mg total) by mouth as needed. For severe headache. 10 tablet 3  . venlafaxine XR (EFFEXOR-XR) 75 MG 24 hr capsule TAKE ONE CAPSULE BY MOUTH ONCE DAILY WITH  BREAKFAST 90 capsule 0  . methocarbamol (ROBAXIN) 500 MG tablet Take 1 tablet (500 mg total) by mouth 2 (two) times daily as needed. (Patient not taking: Reported on 01/22/2015) 60 tablet 1   No current facility-administered medications on file prior to visit.    BP 110/68 mmHg  Temp(Src) 97.9 F (36.6 C) (Oral)  Ht 5\' 5"  (1.651 m)  Wt 132 lb (59.875 kg)  BMI 21.97 kg/m2    Objective:   Physical Exam  Constitutional: She is oriented to person, place, and time. She appears well-developed and well-nourished. No distress.  HENT:  Head: Normocephalic and atraumatic.  Right Ear: External ear normal.  Left Ear: External ear normal.  Mouth/Throat: No oropharyngeal exudate.  Eyes: Conjunctivae and EOM are normal. Pupils are equal, round, and reactive to light.  Neck: Neck supple.  Cardiovascular: Normal rate, regular rhythm and normal heart sounds.   No murmur heard. Pulmonary/Chest: Effort normal and breath sounds normal.  Abdominal: Soft. Bowel sounds are normal. She exhibits no mass. There is no rebound and no guarding.  Bilateral pelvic tenderness  Musculoskeletal: She exhibits no edema.  Lymphadenopathy:    She has no cervical  adenopathy.  Neurological: She is alert and oriented to person, place, and time. No cranial nerve deficit.  Skin: Skin is warm and dry.  Psychiatric: She has a normal mood and affect. Her behavior is normal.          Assessment & Plan:

## 2015-01-22 NOTE — Assessment & Plan Note (Signed)
Reviewed Medicare walls questionnaire in detail. Screening for substance abuse, depression, memory loss and risk for falls negative.  She has history of osteoporosis. Her last DEXA scan was obtained by Dr. Phineas Real. It was completed in 2013. I doubt she will tolerate oral bisphosphonates due to history of recurrent GERD and esophagitis. Patient advised to start vitamin D 2000 units once daily and take Os-Cal twice daily. Patient may be candidate for Prolia.  Patient uptodate with adult vaccines.  She reports episode of shingles within last 6 months.  She defers zostavax until next year.

## 2015-01-22 NOTE — Progress Notes (Signed)
Pre visit review using our clinic review tool, if applicable. No additional management support is needed unless otherwise documented below in the visit note. 

## 2015-01-23 LAB — VITAMIN B12: Vitamin B-12: 287 pg/mL (ref 211–911)

## 2015-01-23 LAB — CA 125: CA 125: 25 U/mL (ref <35)

## 2015-01-24 ENCOUNTER — Telehealth: Payer: Self-pay | Admitting: Internal Medicine

## 2015-01-24 NOTE — Telephone Encounter (Signed)
Pt called to say that there is something in her chart that she disagrees with and  would like a call back .

## 2015-01-24 NOTE — Telephone Encounter (Signed)
Anorexia just means lack of appetite

## 2015-01-24 NOTE — Telephone Encounter (Signed)
Called and spoke with pt and pt would like to know how Dr. Shawna Orleans came up with the diagnosis of anorexia.  Pls advise.

## 2015-01-25 ENCOUNTER — Encounter: Payer: Self-pay | Admitting: Internal Medicine

## 2015-01-25 ENCOUNTER — Telehealth: Payer: Self-pay

## 2015-01-25 NOTE — Telephone Encounter (Signed)
Called pt to advise that in 2011 she was offered the shingles vaccine and she was given a written prescription.  Pt verbalized understanding.

## 2015-01-25 NOTE — Telephone Encounter (Signed)
Called and spoke with pt and pt is aware that anorexia means lack of appetite  in the medical field. Pt verbalized understanding.  See .telephone note from 2.10.16

## 2015-01-25 NOTE — Telephone Encounter (Signed)
Called and spoke with pt and pt is aware of Dr. Lora Havens recommendations.

## 2015-01-29 ENCOUNTER — Other Ambulatory Visit: Payer: Medicare HMO

## 2015-02-01 ENCOUNTER — Ambulatory Visit
Admission: RE | Admit: 2015-02-01 | Discharge: 2015-02-01 | Disposition: A | Discharge status: Home / Self Care | Payer: Medicare HMO | Source: Ambulatory Visit | Attending: Internal Medicine | Admitting: Internal Medicine

## 2015-02-01 DIAGNOSIS — R102 Pelvic and perineal pain: Secondary | ICD-10-CM

## 2015-02-01 DIAGNOSIS — R630 Anorexia: Secondary | ICD-10-CM

## 2015-02-06 ENCOUNTER — Ambulatory Visit: Payer: Medicare HMO | Admitting: *Deleted

## 2015-03-15 ENCOUNTER — Other Ambulatory Visit: Payer: Self-pay | Admitting: Internal Medicine

## 2015-04-02 ENCOUNTER — Encounter: Payer: Self-pay | Admitting: Internal Medicine

## 2015-04-02 ENCOUNTER — Ambulatory Visit (INDEPENDENT_AMBULATORY_CARE_PROVIDER_SITE_OTHER): Payer: Medicare HMO | Admitting: Internal Medicine

## 2015-04-02 VITALS — BP 116/60 | HR 97 | Temp 98.6°F | Ht 65.0 in | Wt 131.0 lb

## 2015-04-02 DIAGNOSIS — R102 Pelvic and perineal pain: Secondary | ICD-10-CM | POA: Diagnosis not present

## 2015-04-02 DIAGNOSIS — K2100 Gastro-esophageal reflux disease with esophagitis, without bleeding: Secondary | ICD-10-CM

## 2015-04-02 DIAGNOSIS — M810 Age-related osteoporosis without current pathological fracture: Secondary | ICD-10-CM | POA: Diagnosis not present

## 2015-04-02 MED ORDER — VENLAFAXINE HCL ER 75 MG PO CP24: ORAL_CAPSULE | ORAL | Status: DC

## 2015-04-02 MED ORDER — METOPROLOL TARTRATE 25 MG PO TABS: 25.0000 mg | ORAL_TABLET | Freq: Two times a day (BID) | ORAL | Status: DC

## 2015-04-02 NOTE — Progress Notes (Signed)
Subjective:    Patient ID: [name], female    DOB: October 03, 1941, 74 y.o.   MRN: [id number]  HPI  74 year old white female for follow-up. Patient Has history of chronic gastroesophageal reflux disease. She complained of intermittent nausea and abdominal bloating. Transvaginal ultrasound was negative for pelvic abnormality. Her CA-125 was normal. Her symptoms improved with 6-8 week therapy of Nexium. She has resumed ranitidine 150 mg once daily.  Chronic anxiety - stable on effexor.  Osteoporosis - repeat DEXA planned.    Review of Systems Negative for significant weight change.    Past Medical History  Diagnosis Date  . Arthritis     osteoarthritis, hands  . Anxiety     Effexor helps--psychiatrist Dr Toy Care  . Chronic headache   . Mild chronic ulcerative colitis   . Atypical chest pain     EF 86%, breast attenuation, no significant ischemia  . GERD (gastroesophageal reflux disease) 11/2009    EGD: Dr. Sharlett Iles: erosive stricture dilated  . Migraines   . 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 Osteoporosis 02/2012    t score -2.5 spine  . Palpitations     History   Social History  . Marital Status: Married    Spouse Name: N/A  . Number of Children: N/A  . Years of Education: N/A   Occupational History  . Not on file.   Social History Main Topics  . Smoking status: Former Smoker -- 1.00 packs/day for 10 years    Types: Cigarettes    Quit date: 12/15/1988  . Smokeless tobacco: Never Used     Comment: quit in the 1980's  . Alcohol Use: 7.0 oz/week    14 drink(s) per week     Comment: 1 glass every few days  . Drug Use: No  . Sexual Activity: No     Comment: HYST   Other Topics Concern  . Not on file   Social History Narrative   Married, mother of one.     Very active. Regular exercise  -- yes.  Quit smoking in 1990.     She monitors her diet well.     1-2 glasses of wine almost every night.    Past Surgical History    Procedure Laterality Date  . Laser vaporization of upper vagina  1998  . Vaginal hysterectomy  1978  . Colposcopy    . Birth mark removed    . Augmentation mammaplasty    . Nm myocar perf wall motion  01/24/2008    EF 86% neg ischemia    Family History  Problem Relation Age of Onset  . Heart disease Mother   . Heart failure Mother   . Breast cancer Mother 74  . Cancer Father     prostate  . Inflammatory bowel disease Sister   . Diabetes Maternal Aunt   . Cancer Paternal Grandfather     colon    Allergies  Allergen Reactions  . Levaquin [Levofloxacin In D5w] Nausea Only    Dizziness and tremulousness    Current Outpatient Prescriptions on File Prior to Visit  Medication Sig Dispense Refill  . azelastine (ASTELIN) 0.1 % nasal spray USE TWO SPRAYS IN EACH NOSTRIL TWICE DAILY AS DIRECTED 30 mL 5  . fluticasone (FLONASE) 50 MCG/ACT nasal spray USE TWO SPRAY(S) IN EACH NOSTRIL ONCE DAILY 16 g 5  . methocarbamol (ROBAXIN) 500 MG tablet Take 1 tablet (500 mg total) by mouth 2 (two) times daily as needed.  60 tablet 1  . SUMAtriptan (IMITREX) 50 MG tablet Take 1 tablet (50 mg total) by mouth as needed. For severe headache. 10 tablet 3   No current facility-administered medications on file prior to visit.    BP 116/60 mmHg  Pulse 97  Temp(Src) 98.6 F (37 C) (Oral)  Ht 5\' 5"  (1.651 m)  Wt 131 lb (59.421 kg)  BMI 21.80 kg/m2    Objective:   Physical Exam  Constitutional: She is oriented to person, place, and time. She appears well-developed and well-nourished.  Cardiovascular: Normal rate, regular rhythm and normal heart sounds.   No murmur heard. Pulmonary/Chest: Effort normal and breath sounds normal.  Neurological: She is alert and oriented to person, place, and time.  Skin: Skin is warm and dry.  Psychiatric: She has a normal mood and affect. Her behavior is normal.          Assessment & Plan:

## 2015-04-02 NOTE — Progress Notes (Signed)
Pre visit review using our clinic review tool, if applicable. No additional management support is needed unless otherwise documented below in the visit note. 

## 2015-04-02 NOTE — Assessment & Plan Note (Signed)
Treated with Nexium 40 mg for 6-8 weeks.  She has transitioned to ranitidine 150 mg once daily.  We discussed risks of long term PPI use.

## 2015-04-02 NOTE — Assessment & Plan Note (Signed)
Pelvic US and CA 125 normal.

## 2015-04-02 NOTE — Assessment & Plan Note (Signed)
Proceed with repeat DEXA.  Continue calcium and vitamin D supplementation.

## 2015-04-05 ENCOUNTER — Encounter: Payer: Self-pay | Admitting: Gynecology

## 2015-04-10 ENCOUNTER — Telehealth: Payer: Self-pay | Admitting: Internal Medicine

## 2015-04-10 DIAGNOSIS — M810 Age-related osteoporosis without current pathological fracture: Secondary | ICD-10-CM

## 2015-04-10 NOTE — Telephone Encounter (Signed)
Referral order placed.

## 2015-04-10 NOTE — Telephone Encounter (Signed)
Pt needs referal for [facility] density at the [facility].

## 2015-04-16 ENCOUNTER — Encounter: Payer: Self-pay | Admitting: Gastroenterology

## 2015-04-27 ENCOUNTER — Encounter: Payer: Self-pay | Admitting: Internal Medicine

## 2015-05-08 ENCOUNTER — Ambulatory Visit
Admission: RE | Admit: 2015-05-08 | Discharge: 2015-05-08 | Disposition: A | Discharge status: Home / Self Care | Payer: Medicare HMO | Source: Ambulatory Visit

## 2015-05-08 ENCOUNTER — Ambulatory Visit
Admission: RE | Admit: 2015-05-08 | Discharge: 2015-05-08 | Disposition: A | Discharge status: Home / Self Care | Payer: Medicare HMO | Source: Ambulatory Visit | Attending: Internal Medicine | Admitting: Internal Medicine

## 2015-05-08 DIAGNOSIS — Z1231 Encounter for screening mammogram for malignant neoplasm of breast: Secondary | ICD-10-CM

## 2015-05-08 DIAGNOSIS — M810 Age-related osteoporosis without current pathological fracture: Secondary | ICD-10-CM

## 2015-05-09 NOTE — Telephone Encounter (Signed)
Tier exception has been submitted

## 2015-05-09 NOTE — Telephone Encounter (Signed)
Tier Exception has been submitted. #[id number]

## 2015-05-22 ENCOUNTER — Ambulatory Visit (INDEPENDENT_AMBULATORY_CARE_PROVIDER_SITE_OTHER): Payer: Medicare HMO | Admitting: Internal Medicine

## 2015-05-22 ENCOUNTER — Other Ambulatory Visit: Payer: Self-pay | Admitting: *Deleted

## 2015-05-22 ENCOUNTER — Encounter: Payer: Self-pay | Admitting: Internal Medicine

## 2015-05-22 VITALS — BP 130/70 | HR 59 | Temp 99.0°F | Resp 20 | Ht 65.0 in | Wt 132.0 lb

## 2015-05-22 DIAGNOSIS — E785 Hyperlipidemia, unspecified: Secondary | ICD-10-CM | POA: Diagnosis not present

## 2015-05-22 DIAGNOSIS — F411 Generalized anxiety disorder: Secondary | ICD-10-CM

## 2015-05-22 MED ORDER — PREDNISONE 20 MG PO TABS: ORAL_TABLET | ORAL | Status: DC

## 2015-05-22 MED ORDER — SUMATRIPTAN SUCCINATE 50 MG PO TABS: 50.0000 mg | ORAL_TABLET | ORAL | Status: DC | PRN

## 2015-05-22 NOTE — Progress Notes (Signed)
Subjective:    Patient ID: [name], female    DOB: 1941-05-05, 74 y.o.   MRN: [id number]  HPI  74 year old patient who has a history of allergic rhinitis.  For the past couple weeks.  She's had increasing sinus pressure, some postnasal drip and cough.  She has had some mild headache.  Nasal secretions are largely clear.  She has been on maintenance nasal sprays.  She often has a difficult time during the spring of the year  Past Medical History  Diagnosis Date  . Arthritis     osteoarthritis, hands  . Anxiety     Effexor helps--psychiatrist Dr Toy Care  . Chronic headache   . Mild chronic ulcerative colitis   . Atypical chest pain     EF 86%, breast attenuation, no significant ischemia  . GERD (gastroesophageal reflux disease) 11/2009    EGD: Dr. Sharlett Iles: erosive stricture dilated  . Migraines   . 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 Osteoporosis 02/2012    t score -2.5 spine  . Palpitations     History   Social History  . Marital Status: Married    Spouse Name: N/A  . Number of Children: N/A  . Years of Education: N/A   Occupational History  . Not on file.   Social History Main Topics  . Smoking status: Former Smoker -- 1.00 packs/day for 10 years    Types: Cigarettes    Quit date: 12/15/1988  . Smokeless tobacco: Never Used     Comment: quit in the 1980's  . Alcohol Use: 7.0 oz/week    14 drink(s) per week     Comment: 1 glass every few days  . Drug Use: No  . Sexual Activity: No     Comment: HYST   Other Topics Concern  . Not on file   Social History Narrative   Married, mother of one.     Very active. Regular exercise  -- yes.  Quit smoking in 1990.     She monitors her diet well.     1-2 glasses of wine almost every night.    Past Surgical History  Procedure Laterality Date  . Laser vaporization of upper vagina  1998  . Vaginal hysterectomy  1978  . Colposcopy    . Birth mark removed    . Augmentation  mammaplasty    . Nm myocar perf wall motion  01/24/2008    EF 86% neg ischemia    Family History  Problem Relation Age of Onset  . Heart disease Mother   . Heart failure Mother   . Breast cancer Mother 48  . Cancer Father     prostate  . Inflammatory bowel disease Sister   . Diabetes Maternal Aunt   . Cancer Paternal Grandfather     colon    Allergies  Allergen Reactions  . Levaquin [Levofloxacin In D5w] Nausea Only    Dizziness and tremulousness    Current Outpatient Prescriptions on File Prior to Visit  Medication Sig Dispense Refill  . azelastine (ASTELIN) 0.1 % nasal spray USE TWO SPRAYS IN EACH NOSTRIL TWICE DAILY AS DIRECTED 30 mL 5  . fluticasone (FLONASE) 50 MCG/ACT nasal spray USE TWO SPRAY(S) IN EACH NOSTRIL ONCE DAILY 16 g 5  . methocarbamol (ROBAXIN) 500 MG tablet Take 1 tablet (500 mg total) by mouth 2 (two) times daily as needed. 60 tablet 1  . metoprolol tartrate (LOPRESSOR) 25 MG tablet Take 1 tablet (25 mg  total) by mouth 2 (two) times daily. 180 tablet 1  . SUMAtriptan (IMITREX) 50 MG tablet Take 1 tablet (50 mg total) by mouth as needed. For severe headache. 10 tablet 3  . venlafaxine XR (EFFEXOR-XR) 75 MG 24 hr capsule TAKE ONE CAPSULE BY MOUTH ONCE DAILY WITH  BREAKFAST 90 capsule 1   No current facility-administered medications on file prior to visit.    BP 130/70 mmHg  Pulse 59  Temp(Src) 99 F (37.2 C) (Oral)  Resp 20  Ht 5\' 5"  (1.651 m)  Wt 132 lb (59.875 kg)  BMI 21.97 kg/m2  SpO2 97%       Review of Systems  Constitutional: Positive for fatigue.  HENT: Positive for congestion, rhinorrhea and sinus pressure. Negative for dental problem, hearing loss, sore throat and tinnitus.   Eyes: Negative for pain, discharge and visual disturbance.  Respiratory: Negative for cough and shortness of breath.   Cardiovascular: Negative for chest pain, palpitations and leg swelling.  Gastrointestinal: Negative for nausea, vomiting, abdominal pain,  diarrhea, constipation, blood in stool and abdominal distention.  Genitourinary: Negative for dysuria, urgency, frequency, hematuria, flank pain, vaginal bleeding, vaginal discharge, difficulty urinating, vaginal pain and pelvic pain.  Musculoskeletal: Negative for joint swelling, arthralgias and gait problem.  Skin: Negative for rash.  Neurological: Negative for dizziness, syncope, speech difficulty, weakness, numbness and headaches.  Hematological: Negative for adenopathy.  Psychiatric/Behavioral: Negative for behavioral problems, dysphoric mood and agitation. The patient is not nervous/anxious.        Objective:   Physical Exam  Constitutional: She is oriented to person, place, and time. She appears well-developed and well-nourished.  HENT:  Head: Normocephalic.  Right Ear: External ear normal.  Left Ear: External ear normal.  Mouth/Throat: Oropharynx is clear and moist.  Weber lateralized to the right  Eyes: Conjunctivae and EOM are normal. Pupils are equal, round, and reactive to light.  Neck: Normal range of motion. Neck supple. No thyromegaly present.  Cardiovascular: Normal rate, regular rhythm, normal heart sounds and intact distal pulses.   Pulmonary/Chest: Effort normal and breath sounds normal.  Abdominal: Soft. Bowel sounds are normal. She exhibits no mass. There is no tenderness.  Musculoskeletal: Normal range of motion.  Lymphadenopathy:    She has no cervical adenopathy.  Neurological: She is alert and oriented to person, place, and time.  Skin: Skin is warm and dry. No rash noted.  Psychiatric: She has a normal mood and affect. Her behavior is normal.          Assessment & Plan:   Flare allergic rhinitis.  Will treat with short-term oral prednisone.  Will continue maintenance nasal sprays. Dyslipidemia Anxiety disorder, stable.  Continue Effexor

## 2015-05-22 NOTE — Patient Instructions (Signed)
Prednisone taper over the next 2 weeks as discussed  [place]  Drink plenty of water. Water helps thin the mucus so your sinuses can drain more easily.  Use a humidifier.  Inhale steam 3 to 4 times a day (for example, sit in the bathroom with the shower running).  Apply a warm, moist washcloth to your face 3 to 4 times a day, or as directed by your health care provider.  Use saline nasal sprays to help moisten and clean your sinuses.     Use saline irrigation, warm  moist compresses and over-the-counter decongestants only as directed.  Call if there is no improvement in 5 to 7 days, or sooner if you develop increasing pain, fever, or any new symptoms.

## 2015-05-22 NOTE — Progress Notes (Signed)
Pre visit review using our clinic review tool, if applicable. No additional management support is needed unless otherwise documented below in the visit note. 

## 2015-05-25 NOTE — Telephone Encounter (Signed)
[facility] - Client [place]    --------------------------------------------------------------------------------   Patient Name: [name]  Gender: Female  DOB: 1941/01/02   Age: 74 Y 3 M 6 D  Return Phone Number: [phone] (Primary)  Address:     City/State/Zip:  [place]     Client [facility] - Client  Client Site [place] - Day  Physician Simonne Martinet   Contact Type Call  Call Type Triage / Clinical  Relationship To Patient Self  Return Phone Number [phone] (Primary)  Chief Complaint Heart palpitations or irregular heartbeat  Initial Comment Caller states is on Prednisone, heart palpatation and anxiety is this normal.Does not want to repeat self again, this is 2nd time. Told her made chart for nurse.   PreDisposition Go to ED       Nurse Assessment  Nurse: Amalia Hailey, RN, Melissa Date/Time (Eastern Time): 05/25/2015 4:21:36 PM  Confirm and document reason for call. If symptomatic, describe symptoms. ---Caller states is on Prednisone, heart palpatation and anxiety is this normal.Does not want to repeat self again, this is 2nd time. Told her made chart for nurse.    Has the patient traveled out of the country within the last 30 days? ---Not Applicable    Does the patient require triage? ---Yes    Related visit to physician within the last 2 weeks? ---Yes    Does the PT have any chronic conditions? (i.e. diabetes, asthma, etc.) ---Yes    List chronic conditions. ---Effexor 75 mg daily anxiey -, Prednisone 40mg  for 3 days, 20 mg x 7 days treatment for cold and ear, Lopressor for headaches,           Guidelines          Guideline Title Affirmed Question Affirmed Notes Nurse Date/Time (Eastern Time)  Anxiety and Panic Attack [1] Lightheadedness or dizziness AND [2] persists > 10 minutes AND [3] not relieved by reassurance provided by triager     Amalia Hailey, RN, Melissa 05/25/2015 4:28:33 PM    Disp. Time Eilene Ghazi Time) Disposition Final User    05/25/2015 4:01:54 PM Attempt made - message left   Amalia Hailey, RN, Lenna Sciara      05/25/2015 4:33:13 PM Go to ED Now Yes Amalia Hailey, RN, Lenna Sciara            Caller Understands: Yes  Disagree/Comply: Comply       Care Advice Given Per Guideline        GO TO ED NOW: You need to be seen in the Emergency Department. Go to the ER at ___________ [place] now. Drive carefully. DRIVING: Another adult should drive. BRING MEDICINES: * It is also a good idea to bring the pill bottles too. This will help the doctor to make certain you are taking the right medicines and the right dose.    After Care Instructions Given        Call Event Type User Date / Time Description        --------------------------------------------------------------------------------            Referrals  [facility] - ED

## 2015-05-28 ENCOUNTER — Telehealth: Payer: Self-pay | Admitting: *Deleted

## 2015-05-28 NOTE — Telephone Encounter (Signed)
FYI

## 2015-05-28 NOTE — Telephone Encounter (Signed)
Spoke to patient. Patient states she stopped taking her prednisone after feeling palpitations. Symptoms have resolved since stopped taking prednisone and she feels fine now. Did not request appointment to be seen.

## 2015-05-28 NOTE — Telephone Encounter (Signed)
PLEASE NOTE: All timestamps contained within this report are represented as [place] Standard Time. CONFIDENTIALTY NOTICE: This fax transmission is intended only for the addressee. It contains information that is legally privileged, confidential or otherwise protected from use or disclosure. If you are not the intended recipient, you are strictly prohibited from reviewing, disclosing, copying using or disseminating any of this information or taking any action in reliance on or regarding this information. If you have received this fax in error, please notify us immediately by telephone so that we can arrange for its return to [place]. Phone: [phone], Toll-Free: [phone], Fax: [phone] Page: 1 of 2 Call Id: [id number] [facility] - Client [place] Patient Name: [name] Gender: Female DOB: 27-Apr-1941 Age: 74 Y 3 M 6 D Return Phone Number: [phone] (Primary) Address: City/State/Zip: [place] Client [facility] - Client Client Site [place] - Day Physician Simonne Martinet Contact Type Call Call Type Triage / Clinical Relationship To Patient Self Appointment Disposition EMR Appointment Not Necessary Info pasted into Epic Yes Return Phone Number [phone] (Primary) Chief Complaint Heart palpitations or irregular heartbeat Initial Comment Caller states is on Prednisone, heart palpatation and anxiety is this normal.Does not want to repeat self again, this is 2nd time. Told her made chart for nurse. PreDisposition Go to ED Nurse Assessment Nurse: Amalia Hailey, RN, Melissa Date/Time (Eastern Time): 05/25/2015 4:21:36 PM Confirm and document reason for call. If symptomatic, describe symptoms. ---Caller states is on Prednisone, heart palpatation and anxiety is this normal.Does not want to repeat self again, this is 2nd time. Told her made chart for nurse. Has the patient  traveled out of the country within the last 30 days? ---Not Applicable Does the patient require triage? ---Yes Related visit to physician within the last 2 weeks? ---Yes Does the PT have any chronic conditions? (i.e. diabetes, asthma, etc.) ---Yes List chronic conditions. ---Effexor 75 mg daily anxiey -, Prednisone 40mg  for 3 days, 20 mg x 7 days treatment for cold and ear, Lopressor for headaches, Guidelines Guideline Title Affirmed Question Affirmed Notes Nurse Date/Time (Eastern Time) Anxiety and Panic Attack [1] Lightheadedness or dizziness AND [2] persists > 10 minutes AND [3] not relieved by reassurance provided by triager Amalia Hailey, RN, Melissa 05/25/2015 4:28:33 PM Disp. Time Eilene Ghazi Time) Disposition Final User 05/25/2015 4:01:54 PM Attempt made - message left Amalia Hailey, RN, Lenna Sciara 05/25/2015 4:33:13 PM Go to ED Now Yes Amalia Hailey, RN, Melissa PLEASE NOTE: All timestamps contained within this report are represented as [place] Standard Time. CONFIDENTIALTY NOTICE: This fax transmission is intended only for the addressee. It contains information that is legally privileged, confidential or otherwise protected from use or disclosure. If you are not the intended recipient, you are strictly prohibited from reviewing, disclosing, copying using or disseminating any of this information or taking any action in reliance on or regarding this information. If you have received this fax in error, please notify us immediately by telephone so that we can arrange for its return to [place]. Phone: [phone], Toll-Free: [phone], Fax: [phone] Page: 2 of 2 Call Id: [id number] Caller Understands: Yes Disagree/Comply: Comply Care Advice Given Per Guideline GO TO ED NOW: You need to be seen in the Emergency Department. Go to the ER at ___________ [place] now. Drive carefully. DRIVING: Another adult should drive. BRING MEDICINES: * It is also a good idea to bring the pill bottles too. This  will help the doctor to make certain you  are taking the right medicines and the right dose. After Care Instructions Given Call Event Type User Date / Time Description Referrals [facility] - ED

## 2015-05-29 ENCOUNTER — Encounter: Payer: Self-pay | Admitting: Gynecology

## 2015-05-30 NOTE — Telephone Encounter (Signed)
The Tier Exception was approved, it is good till 12/15/15.

## 2015-06-12 ENCOUNTER — Encounter: Payer: Self-pay | Admitting: Internal Medicine

## 2015-07-06 ENCOUNTER — Telehealth: Payer: Self-pay | Admitting: Internal Medicine

## 2015-07-06 NOTE — Telephone Encounter (Signed)
Received records from [place] forwarded to Dr. Shawna Orleans Doe Hyun 7/22/16fbg.

## 2015-09-18 ENCOUNTER — Ambulatory Visit (INDEPENDENT_AMBULATORY_CARE_PROVIDER_SITE_OTHER): Payer: Medicare HMO | Admitting: Family Medicine

## 2015-09-18 ENCOUNTER — Encounter: Payer: Self-pay | Admitting: Family Medicine

## 2015-09-18 VITALS — BP 122/70 | HR 57 | Temp 98.3°F | Wt 130.0 lb

## 2015-09-18 DIAGNOSIS — M5442 Lumbago with sciatica, left side: Secondary | ICD-10-CM

## 2015-09-18 MED ORDER — DICLOFENAC SODIUM 75 MG PO TBEC: 75.0000 mg | DELAYED_RELEASE_TABLET | Freq: Two times a day (BID) | ORAL | Status: DC

## 2015-09-18 NOTE — Progress Notes (Signed)
Garret Reddish, MD  Subjective:  [name] is a 74 y.o. year old very pleasant female patient who presents with:  Left leg pain and left low back pain Shingles last June and has had post herpetic neuralgia Seemed to ease off and go away  Unless on her feet for long time will have some mild pain in anterior thigh on left  Fall  When slipped off curb about a month ago and has had pain in the knee and some pain in left upper leg. Some bruising on hip but issue was minimal.   Over last weekend. Developed worsening pain.  Pain in left low back as well as pain into left lower leg going down to ankle. Does seem to be most pronounced anterior. Thigh.  Never had before Ibuprofen- helps minimally 5/10 most of the time, sit goes up to 8/10.   ROS-No saddle anesthesia, bladder incontinence or retention, fecal incontinence, weakness in extremity, numbness or tingling in extremity. History negative for trauma, history of cancer, fever, chills, unintentional weight loss, recent bacterial infection, recent IV drug use, HIV, pain worse at night or while supine.   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 metoprolol tartrate (LOPRESSOR) 25 MG tablet Take 1 tablet (25 mg total) by mouth 2 (two) times daily. 180 tablet 1  . venlafaxine XR (EFFEXOR-XR) 75 MG 24 hr capsule TAKE ONE CAPSULE BY MOUTH ONCE DAILY WITH  BREAKFAST 90 capsule 1  . azelastine (ASTELIN) 0.1 % nasal spray USE TWO SPRAYS IN EACH NOSTRIL TWICE DAILY AS DIRECTED (Patient not taking: Reported on 09/18/2015) 30 mL 5  . fluticasone (FLONASE) 50 MCG/ACT nasal spray USE TWO SPRAY(S) IN EACH NOSTRIL ONCE DAILY (Patient not taking: Reported on 09/18/2015) 16 g 5  . methocarbamol (ROBAXIN) 500 MG tablet Take 1 tablet (500 mg total) by mouth 2 (two) times daily as needed. (Patient not  taking: Reported on 09/18/2015) 60 tablet 1  . SUMAtriptan (IMITREX) 50 MG tablet Take 1 tablet (50 mg total) by mouth as needed. For severe headache. (Patient not taking: Reported on 09/18/2015) 10 tablet 3   Objective: BP 122/70 mmHg  Pulse 57  Temp(Src) 98.3 F (36.8 C)  Wt 130 lb (58.968 kg) Gen: NAD, resting comfortably CV: RRR no murmurs rubs or gallops Lungs: CTAB no crackles, wheeze, rhonchi Abdomen: soft/nontender/nondistended/normal bowel sounds. No rebound or guarding.  Ext: no edema Skin: warm, dry, no rash over leg  MSK No Pain with flexion, IR, ER of hip and full ROM Knee exam bilateral normal Back - Normal skin, Spine with normal alignment and no deformity.  No tenderness to vertebral process palpation.  Paraspinous muscles are  tender and with spasm in left low back.   Range of motion is full at neck and lumbar sacral regions. Negative Straight leg raise.   Neuro- no saddle anesthesia, 5/5 strength lower extremities, 2+ reflexes  Assessment/Plan:  Left leg pain and left low back pain No red flags. 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 Was surpised by negative straight leg raise. Patient states had bad reaction to last prednisone and would prefer not to use. We instead will use voltaren BID and low back icing for 10 days then follow up. Consider imaging vs. Ortho referral at that time.   Meds ordered this encounter  Medications  . diclofenac (VOLTAREN) 75 MG EC tablet  Sig: Take 1 tablet (75 mg total) by mouth 2 (two) times daily.    Dispense:  20 tablet    Refill:  0

## 2015-09-18 NOTE — Patient Instructions (Signed)
At your age, most likely issue here is arthritis. We are going to use 10 days of a strong antiinflammatory to try to cause this down.   Let's follow up in 10 days to see how you are doing. We can consider x-rays or orthopedic referral if needed. My hope is for you to be 50% better. Usually this can take up to 6 weeks to fully resolve so if it has not at that time, we can also investigate further

## 2015-10-23 ENCOUNTER — Ambulatory Visit (INDEPENDENT_AMBULATORY_CARE_PROVIDER_SITE_OTHER): Payer: Medicare HMO | Admitting: Internal Medicine

## 2015-10-23 ENCOUNTER — Encounter: Payer: Self-pay | Admitting: Internal Medicine

## 2015-10-23 VITALS — BP 134/90 | Temp 98.2°F | Ht 65.0 in | Wt 130.7 lb

## 2015-10-23 DIAGNOSIS — J019 Acute sinusitis, unspecified: Secondary | ICD-10-CM | POA: Diagnosis not present

## 2015-10-23 DIAGNOSIS — J040 Acute laryngitis: Secondary | ICD-10-CM | POA: Diagnosis not present

## 2015-10-23 MED ORDER — HYDROCODONE-HOMATROPINE 5-1.5 MG/5ML PO SYRP: ORAL_SOLUTION | ORAL | Status: DC

## 2015-10-23 NOTE — Patient Instructions (Signed)
This acts like a viral respiratory infection .Marland Kitchen  And should run its course .  May take another 7- 10 days  contac [place] if fever  Sever pain   Not going away  Shortness of breath    Stay on the flonase for underlying allergy .   Cough med for comfort if needed .  hydration fluids  Voice rest can also help.   Laryngitis Laryngitis is inflammation of your vocal cords. This causes hoarseness, coughing, loss of voice, sore throat, or a dry throat. Your vocal cords are two bands of muscles that are found in your throat. When you speak, these cords come together and vibrate. These vibrations come out through your mouth as sound. When your vocal cords are inflamed, your voice sounds different. Laryngitis can be temporary (acute) or long-term (chronic). Most cases of acute laryngitis improve with time. Chronic laryngitis is laryngitis that lasts for more than three weeks. CAUSES Acute laryngitis may be caused by:  A viral infection.  Lots of talking, yelling, or singing. This is also called vocal strain.  Bacterial infections. Chronic laryngitis may be caused by:  Vocal strain.  Injury to your vocal cords.  Acid reflux (gastroesophageal reflux disease or GERD).  Allergies.  Sinus infection.  Smoking.  Alcohol abuse.  Breathing in chemicals or dust.  Growths on the vocal cords. RISK FACTORS Risk factors for laryngitis include:  Smoking.  Alcohol abuse.  Having allergies. SIGNS AND SYMPTOMS Symptoms of laryngitis may include:  Low, hoarse voice.  Loss of voice.  Dry cough.  Sore throat.  Stuffy nose. DIAGNOSIS Laryngitis may be diagnosed by:  Physical exam.  Throat culture.  Blood test.  Laryngoscopy. This procedure allows your health care provider to look at your vocal cords with a mirror or viewing tube. TREATMENT Treatment for laryngitis depends on what is causing it. Usually, treatment involves resting your voice and using medicines to soothe your throat.  However, if your laryngitis is caused by a bacterial infection, you may need to take antibiotic medicine. If your laryngitis is caused by a growth, you may need to have a procedure to remove it. HOME CARE INSTRUCTIONS  Drink enough fluid to keep your urine clear or pale yellow.  Breathe in moist air. Use a humidifier if you live in a dry climate.  Take medicines only as directed by your health care provider.  If you were prescribed an antibiotic medicine, finish it all even if you start to feel better.  Do not smoke cigarettes or electronic cigarettes. If you need help quitting, ask your health care provider.  Talk as little as possible. Also avoid whispering, which can cause vocal strain.  Write instead of talking. Do this until your voice is back to normal. SEEK MEDICAL CARE IF:  You have a fever.  You have increasing pain.  You have difficulty swallowing. SEEK IMMEDIATE MEDICAL CARE IF:  You cough up blood.  You have trouble breathing.   This information is not intended to replace advice given to you by your health care provider. Make sure you discuss any questions you have with your health care provider.   Document Released: 12/01/2005 Document Revised: 12/22/2014 Document Reviewed: 05/16/2014 Elsevier Interactive Patient Education [employer].

## 2015-10-23 NOTE — Progress Notes (Signed)
Pre visit review using our clinic review tool, if applicable. No additional management support is needed unless otherwise documented below in the visit note.    Chief Complaint  Patient presents with  . Cough    Worsened on Sunday  . Sore Throat  . Hoarse  . Nasal Congestion  . Sinus Pressure/Pain    HPI: Patient [name]  comes in today for SDA for  new problem evaluation. Had  Ongoing congstions  AND NOW 3DAYS  Of nasl congesiton pnd and hoarse cough without strider or dob.  Last night right cheek pain sorenesss better today  Husband some illness anad hoarseness also   [place]   And used nasal spray  saline ROS: See pertinent positives and negatives per HPI.  Past Medical History  Diagnosis Date  . Arthritis     osteoarthritis, hands  . Anxiety     Effexor helps--psychiatrist Dr Toy Care  . Chronic headache   . Mild chronic ulcerative colitis ([place])   . Atypical chest pain     EF 86%, breast attenuation, no significant ischemia  . GERD (gastroesophageal reflux disease) 11/2009    EGD: Dr. Sharlett Iles: erosive stricture dilated  . Migraines   . 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 Osteoporosis 02/2012    t score -2.5 spine  . Palpitations     Family History  Problem Relation Age of Onset  . Heart disease Mother   . Heart failure Mother   . Breast cancer Mother 106  . Cancer Father     prostate  . Inflammatory bowel disease Sister   . Diabetes Maternal Aunt   . Cancer Paternal Grandfather     colon    Social History   Social History  . Marital Status: Married    Spouse Name: N/A  . Number of Children: N/A  . Years of Education: N/A   Social History Main Topics  . Smoking status: Former Smoker -- 1.00 packs/day for 10 years    Types: Cigarettes    Quit date: 12/15/1988  . Smokeless tobacco: Never Used     Comment: quit in the 1980's  . Alcohol Use: 7.0 oz/week    14 drink(s) per week     Comment: 1  glass every few days  . Drug Use: No  . Sexual Activity: No     Comment: HYST   Other Topics Concern  . None   Social History Narrative   Married, mother of one.     Very active. Regular exercise  -- yes.  Quit smoking in 1990.     She monitors her diet well.     1-2 glasses of wine almost every night.    Outpatient Prescriptions Prior to Visit  Medication Sig Dispense Refill  . azelastine (ASTELIN) 0.1 % nasal spray USE TWO SPRAYS IN EACH NOSTRIL TWICE DAILY AS DIRECTED 30 mL 5  . fluticasone (FLONASE) 50 MCG/ACT nasal spray USE TWO SPRAY(S) IN EACH NOSTRIL ONCE DAILY 16 g 5  . methocarbamol (ROBAXIN) 500 MG tablet Take 1 tablet (500 mg total) by mouth 2 (two) times daily as needed. 60 tablet 1  . SUMAtriptan (IMITREX) 50 MG tablet Take 1 tablet (50 mg total) by mouth as needed. For severe headache. 10 tablet 3  . venlafaxine XR (EFFEXOR-XR) 75 MG 24 hr capsule TAKE ONE CAPSULE BY MOUTH ONCE DAILY WITH  BREAKFAST 90 capsule 1  . diclofenac (VOLTAREN) 75 MG EC tablet Take  1 tablet (75 mg total) by mouth 2 (two) times daily. 20 tablet 0  . metoprolol tartrate (LOPRESSOR) 25 MG tablet Take 1 tablet (25 mg total) by mouth 2 (two) times daily. 180 tablet 1   No facility-administered medications prior to visit.     EXAM:  BP 134/90 mmHg  Temp(Src) 98.2 F (36.8 C) (Oral)  Ht 5\' 5"  (1.651 m)  Wt 130 lb 11.2 oz (59.285 kg)  BMI 21.75 kg/m2  Body mass index is 21.75 kg/(m^2). WDWN in NAD  quiet respirations; mildly congested  somewhat hoarse. Non toxic . HEENT: Normocephalic ;atraumatic , Eyes;  PERRL, EOMs  Full, lids and conjunctiva clear,,Ears: no deformities, canals nl, TM landmarks normal, Nose: no deformity or discharge but congested;facer  minimally tender Mouth : OP clear without lesion or edema . Neck: Supple without adenopathy or masses or bruits Chest:  Clear to A&P without wheezes rales or rhonchi CV:  S1-S2 no gallops or murmurs peripheral perfusion is normal Skin :nl  perfusion and no acute rashes   ASSESSMENT AND PLAN:  Discussed the following assessment and plan:  Laryngitis, acute  Acute rhinosinusitis Most like viral uri    Expectant management. anad fu complications or alarm sx   -Patient advised to return or notify health care team  if symptoms worsen ,persist or new concerns arise.  Patient Instructions  This acts like a viral respiratory infection .Marland Kitchen  And should run its course .  May take another 7- 10 days  contac [place] if fever  Sever pain   Not going away  Shortness of breath    Stay on the flonase for underlying allergy .   Cough med for comfort if needed .  hydration fluids  Voice rest can also help.   Laryngitis Laryngitis is inflammation of your vocal cords. This causes hoarseness, coughing, loss of voice, sore throat, or a dry throat. Your vocal cords are two bands of muscles that are found in your throat. When you speak, these cords come together and vibrate. These vibrations come out through your mouth as sound. When your vocal cords are inflamed, your voice sounds different. Laryngitis can be temporary (acute) or long-term (chronic). Most cases of acute laryngitis improve with time. Chronic laryngitis is laryngitis that lasts for more than three weeks. CAUSES Acute laryngitis may be caused by:  A viral infection.  Lots of talking, yelling, or singing. This is also called vocal strain.  Bacterial infections. Chronic laryngitis may be caused by:  Vocal strain.  Injury to your vocal cords.  Acid reflux (gastroesophageal reflux disease or GERD).  Allergies.  Sinus infection.  Smoking.  Alcohol abuse.  Breathing in chemicals or dust.  Growths on the vocal cords. RISK FACTORS Risk factors for laryngitis include:  Smoking.  Alcohol abuse.  Having allergies. SIGNS AND SYMPTOMS Symptoms of laryngitis may include:  Low, hoarse voice.  Loss of voice.  Dry cough.  Sore throat.  Stuffy  nose. DIAGNOSIS Laryngitis may be diagnosed by:  Physical exam.  Throat culture.  Blood test.  Laryngoscopy. This procedure allows your health care provider to look at your vocal cords with a mirror or viewing tube. TREATMENT Treatment for laryngitis depends on what is causing it. Usually, treatment involves resting your voice and using medicines to soothe your throat. However, if your laryngitis is caused by a bacterial infection, you may need to take antibiotic medicine. If your laryngitis is caused by a growth, you may need to have a procedure to remove  it. HOME CARE INSTRUCTIONS  Drink enough fluid to keep your urine clear or pale yellow.  Breathe in moist air. Use a humidifier if you live in a dry climate.  Take medicines only as directed by your health care provider.  If you were prescribed an antibiotic medicine, finish it all even if you start to feel better.  Do not smoke cigarettes or electronic cigarettes. If you need help quitting, ask your health care provider.  Talk as little as possible. Also avoid whispering, which can cause vocal strain.  Write instead of talking. Do this until your voice is back to normal. SEEK MEDICAL CARE IF:  You have a fever.  You have increasing pain.  You have difficulty swallowing. SEEK IMMEDIATE MEDICAL CARE IF:  You cough up blood.  You have trouble breathing.   This information is not intended to replace advice given to you by your health care provider. Make sure you discuss any questions you have with your health care provider.   Document Released: 12/01/2005 Document Revised: 12/22/2014 Document Reviewed: 05/16/2014 Elsevier Interactive Patient Education 2016 [place] K. Parlee Amescua M.D.   Patient [name]  comes in today for SDA for  new problem evaluation.

## 2015-10-25 ENCOUNTER — Telehealth: Payer: Self-pay | Admitting: Internal Medicine

## 2015-10-25 ENCOUNTER — Other Ambulatory Visit: Payer: Self-pay | Admitting: Internal Medicine

## 2015-10-25 MED ORDER — VENLAFAXINE HCL ER 75 MG PO CP24: ORAL_CAPSULE | ORAL | Status: DC

## 2015-10-25 NOTE — Telephone Encounter (Signed)
Pt states she has been on  venlafaxine XR (EFFEXOR-XR) 75 MG 24 hr capsule   for 6 yrs and would like this med changed to a 90 day. Pt states she does not like having to call every month to get this refilled.  [facility]

## 2015-11-07 ENCOUNTER — Other Ambulatory Visit: Payer: Self-pay | Admitting: Family Medicine

## 2015-11-13 ENCOUNTER — Telehealth: Payer: Self-pay | Admitting: Internal Medicine

## 2015-11-13 MED ORDER — AMOXICILLIN-POT CLAVULANATE 875-125 MG PO TABS: 1.0000 | ORAL_TABLET | Freq: Two times a day (BID) | ORAL | Status: DC

## 2015-11-13 NOTE — Telephone Encounter (Signed)
Spoke to the pt.  Said she got better for a day or two then sx returned.  Unsure if she has had a fever.  Sometimes feels warm and then gets chills.  Continues to have nasal congestion, irritated throat, Sinus pressure/pain/swelling.  Feels very tired.  Not sleeping well at night.  Cough is now only occasional.  Would like antibiotic called into [facility] on [address].  Please advise.  Thanks!

## 2015-11-13 NOTE — Telephone Encounter (Signed)
Spoke to the pt.  She denied being allergic to Augment.  Medication sent to the pharmacy by e-scribe.

## 2015-11-13 NOTE — Telephone Encounter (Signed)
Will send in augmentin for poss bacterial sinusitis   If not allergic

## 2015-11-13 NOTE — Telephone Encounter (Signed)
Pt call to say that Dr Regis Bill told her to call back and ask for a antibiotic if she still having laryngitis    Pharmacy [facility] [address]

## 2015-11-16 ENCOUNTER — Telehealth: Payer: Self-pay | Admitting: Internal Medicine

## 2015-11-16 NOTE — Telephone Encounter (Signed)
Patient Name: [name]  DOB: 05/28/1941    Initial Comment Caller states she took antibiotics but still has cough and hoarseness   Nurse Assessment  Nurse: Raphael Gibney, RN, Vanita Ingles Date/Time (Eastern Time): 11/16/2015 11:47:28 AM  Confirm and document reason for call. If symptomatic, describe symptoms. ---Caller states she is taking augmentin for sinus infection. She has taken antibiotics for 3 days. She is supposed to take antibiotics for 7 days. Her hoarseness is a little better. She took nyquil last night. cough is not much better. No fever.  Has the patient traveled out of the country within the last 30 days? ---No  Does the patient have any new or worsening symptoms? ---Yes  Will a triage be completed? ---Yes  Related visit to physician within the last 2 weeks? ---Yes  Does the PT have any chronic conditions? (i.e. diabetes, asthma, etc.) ---Yes  List chronic conditions. ---allergies, migraines  Is this a behavioral health or substance abuse call? ---No     Guidelines    Guideline Title Affirmed Question Affirmed Notes  Sinus Infection on Antibiotic Follow-up Call [1] Reasonable improvement on antibiotic AND [2] no fever or pain (all triage questions negative)    Final Disposition User   Home Care Raphael Gibney, RN, Vanita Ingles    Disagree/Comply: Comply

## 2015-11-16 NOTE — Telephone Encounter (Signed)
Ms. [name] called saying she's not feeling better since her appt with Dr. Regis Bill on 11-15-2015 and would like a phone call regarding this.  Pt also needs her 11/15/2015 appt re-coded. Per her ins carrier it was coded as an outpatient visit and she said that's incorrect. She'd like that apt to be changed and re-filed with her insurance. She currently has a balance of $45 and said she shouldn't. Please call the pt if you have questions or concerns.  Pt ph# [phone] Thank you.

## 2015-11-16 NOTE — Telephone Encounter (Signed)
noted 

## 2015-11-16 NOTE — Telephone Encounter (Signed)
Call transferred to the triage nurse.  Will send to Dorna Bloom for further help with coding.

## 2015-11-20 NOTE — Telephone Encounter (Signed)
I have emailed our Chemical engineer. We are OON with [facility] but it also appears that she was made responsible for an OON Specialist copay instead of just an OON PCP copay.

## 2015-12-17 DIAGNOSIS — R300 Dysuria: Secondary | ICD-10-CM | POA: Diagnosis not present

## 2015-12-22 ENCOUNTER — Emergency Department (HOSPITAL_COMMUNITY): Payer: PPO

## 2015-12-22 ENCOUNTER — Encounter (HOSPITAL_COMMUNITY): Payer: Self-pay

## 2015-12-22 ENCOUNTER — Emergency Department (HOSPITAL_COMMUNITY)
Admission: EM | Admit: 2015-12-22 | Discharge: 2015-12-22 | Disposition: A | Discharge status: Home / Self Care | Payer: PPO | Attending: Emergency Medicine | Admitting: Emergency Medicine

## 2015-12-22 DIAGNOSIS — R109 Unspecified abdominal pain: Secondary | ICD-10-CM

## 2015-12-22 DIAGNOSIS — R10817 Generalized abdominal tenderness: Secondary | ICD-10-CM | POA: Diagnosis not present

## 2015-12-22 DIAGNOSIS — F419 Anxiety disorder, unspecified: Secondary | ICD-10-CM | POA: Insufficient documentation

## 2015-12-22 DIAGNOSIS — Z86008 Personal history of in-situ neoplasm of other site: Secondary | ICD-10-CM | POA: Insufficient documentation

## 2015-12-22 DIAGNOSIS — G8929 Other chronic pain: Secondary | ICD-10-CM | POA: Diagnosis not present

## 2015-12-22 DIAGNOSIS — K5900 Constipation, unspecified: Secondary | ICD-10-CM | POA: Diagnosis not present

## 2015-12-22 DIAGNOSIS — M1990 Unspecified osteoarthritis, unspecified site: Secondary | ICD-10-CM | POA: Insufficient documentation

## 2015-12-22 DIAGNOSIS — R0789 Other chest pain: Secondary | ICD-10-CM | POA: Diagnosis not present

## 2015-12-22 DIAGNOSIS — K2970 Gastritis, unspecified, without bleeding: Secondary | ICD-10-CM | POA: Diagnosis not present

## 2015-12-22 DIAGNOSIS — Z79899 Other long term (current) drug therapy: Secondary | ICD-10-CM | POA: Diagnosis not present

## 2015-12-22 DIAGNOSIS — Z90710 Acquired absence of both cervix and uterus: Secondary | ICD-10-CM | POA: Insufficient documentation

## 2015-12-22 DIAGNOSIS — K219 Gastro-esophageal reflux disease without esophagitis: Secondary | ICD-10-CM | POA: Diagnosis not present

## 2015-12-22 DIAGNOSIS — G43909 Migraine, unspecified, not intractable, without status migrainosus: Secondary | ICD-10-CM | POA: Insufficient documentation

## 2015-12-22 DIAGNOSIS — Z87891 Personal history of nicotine dependence: Secondary | ICD-10-CM | POA: Diagnosis not present

## 2015-12-22 DIAGNOSIS — R1013 Epigastric pain: Secondary | ICD-10-CM | POA: Diagnosis not present

## 2015-12-22 DIAGNOSIS — Z7951 Long term (current) use of inhaled steroids: Secondary | ICD-10-CM | POA: Insufficient documentation

## 2015-12-22 DIAGNOSIS — R1030 Lower abdominal pain, unspecified: Secondary | ICD-10-CM | POA: Diagnosis not present

## 2015-12-22 DIAGNOSIS — R091 Pleurisy: Secondary | ICD-10-CM | POA: Insufficient documentation

## 2015-12-22 LAB — CBC WITH DIFFERENTIAL/PLATELET
BASOS ABS: 0 10*3/uL (ref 0.0–0.1)
BASOS PCT: 0 %
EOS PCT: 2 %
Eosinophils Absolute: 0.1 10*3/uL (ref 0.0–0.7)
HEMATOCRIT: 39 % (ref 36.0–46.0)
Hemoglobin: 13.3 g/dL (ref 12.0–15.0)
Lymphocytes Relative: 21 %
Lymphs Abs: 1.5 10*3/uL (ref 0.7–4.0)
MCH: 31.9 pg (ref 26.0–34.0)
MCHC: 34.1 g/dL (ref 30.0–36.0)
MCV: 93.5 fL (ref 78.0–100.0)
Monocytes Absolute: 0.5 10*3/uL (ref 0.1–1.0)
Monocytes Relative: 7 %
NEUTROS ABS: 4.9 10*3/uL (ref 1.7–7.7)
Neutrophils Relative %: 70 %
PLATELETS: 190 10*3/uL (ref 150–400)
RBC: 4.17 MIL/uL (ref 3.87–5.11)
RDW: 12.3 % (ref 11.5–15.5)
WBC: 7 10*3/uL (ref 4.0–10.5)

## 2015-12-22 LAB — COMPREHENSIVE METABOLIC PANEL
ALT: 40 U/L (ref 14–54)
AST: 26 U/L (ref 15–41)
Albumin: 4.3 g/dL (ref 3.5–5.0)
Alkaline Phosphatase: 76 U/L (ref 38–126)
Anion gap: 10 (ref 5–15)
BUN: 16 mg/dL (ref 6–20)
CHLORIDE: 105 mmol/L (ref 101–111)
CO2: 22 mmol/L (ref 22–32)
CREATININE: 0.57 mg/dL (ref 0.44–1.00)
Calcium: 9.3 mg/dL (ref 8.9–10.3)
GFR calc Af Amer: >60 mL/min (ref >60)
GFR calc non Af Amer: >60 mL/min (ref >60)
Glucose, Bld: 113 mg/dL — ABNORMAL HIGH (ref 65–99)
Potassium: 4.4 mmol/L (ref 3.5–5.1)
Sodium: 137 mmol/L (ref 135–145)
Total Bilirubin: 0.9 mg/dL (ref 0.3–1.2)
Total Protein: 7.1 g/dL (ref 6.5–8.1)

## 2015-12-22 LAB — LIPASE, BLOOD: LIPASE: 25 U/L (ref 11–51)

## 2015-12-22 LAB — URINALYSIS, ROUTINE W REFLEX MICROSCOPIC
BILIRUBIN URINE: NEGATIVE
Glucose, UA: NEGATIVE mg/dL
Hgb urine dipstick: NEGATIVE
KETONES UR: NEGATIVE mg/dL
Leukocytes, UA: NEGATIVE
NITRITE: NEGATIVE
Protein, ur: NEGATIVE mg/dL
Specific Gravity, Urine: 1.017 (ref 1.005–1.030)
pH: 7.5 (ref 5.0–8.0)

## 2015-12-22 LAB — D-DIMER, QUANTITATIVE (NOT AT ARMC): D DIMER QUANT: 0.75 ug{FEU}/mL — AB (ref 0.00–0.50)

## 2015-12-22 LAB — TROPONIN I: Troponin I: <0.03 ng/mL (ref <0.031)

## 2015-12-22 MED ORDER — ORPHENADRINE CITRATE ER 100 MG PO TB12: 100.0000 mg | ORAL_TABLET | Freq: Two times a day (BID) | ORAL | Status: DC

## 2015-12-22 MED ORDER — NAPROXEN 500 MG PO TABS: 500.0000 mg | ORAL_TABLET | Freq: Two times a day (BID) | ORAL | Status: DC

## 2015-12-22 MED ORDER — IOHEXOL 350 MG/ML SOLN
100.0000 mL | Freq: Once | INTRAVENOUS | Status: AC | PRN
  Administered 2015-12-22: 100 mL via INTRAVENOUS

## 2015-12-22 MED ORDER — PANTOPRAZOLE SODIUM 40 MG IV SOLR
40.0000 mg | Freq: Once | INTRAVENOUS | Status: AC
Start: 2015-12-22 — End: 2015-12-22
  Administered 2015-12-22: 40 mg via INTRAVENOUS
  Filled 2015-12-22: qty 40

## 2015-12-22 MED ORDER — DOCUSATE SODIUM 100 MG PO CAPS: 100.0000 mg | ORAL_CAPSULE | Freq: Two times a day (BID) | ORAL | Status: DC

## 2015-12-22 MED ORDER — MAGNESIUM CITRATE PO SOLN: 1.0000 | Freq: Once | ORAL | Status: DC

## 2015-12-22 MED ORDER — MORPHINE SULFATE (PF) 4 MG/ML IV SOLN
4.0000 mg | Freq: Once | INTRAVENOUS | Status: AC
  Administered 2015-12-22: 4 mg via INTRAVENOUS
  Filled 2015-12-22: qty 1

## 2015-12-22 MED ORDER — POLYETHYLENE GLYCOL 3350 17 G PO PACK: 17.0000 g | PACK | Freq: Every day | ORAL | Status: DC

## 2015-12-22 MED ORDER — SODIUM CHLORIDE 0.9 % IV SOLN
INTRAVENOUS | Status: DC
  Administered 2015-12-22: 10:00:00 via INTRAVENOUS

## 2015-12-22 MED ORDER — PANTOPRAZOLE SODIUM 20 MG PO TBEC: 20.0000 mg | DELAYED_RELEASE_TABLET | Freq: Every day | ORAL | Status: DC

## 2015-12-22 NOTE — ED Notes (Signed)
Per EMS, pt from home.  Pt had pain starting on left side radiating to front and back.  Pt states pain started on 1/1.  Pt pain increases with palpation.  Seen by MD few days ago with antibiotics for UTI.  Nausea in route.  Motion sickness.  Vitals: 148/80, hr 74, resp 16, 97% ra. 129mcg fentanyl 20 g RAC.

## 2015-12-22 NOTE — ED Notes (Signed)
MD at bedside. 

## 2015-12-22 NOTE — ED Notes (Signed)
Bed: [id number] Expected date: 12/22/15 Expected time: 9:34 AM Means of arrival: Ambulance Comments: Abd pain

## 2015-12-22 NOTE — Discharge Instructions (Signed)
Pleurisy Pleurisy is an inflammation and swelling of the lining of the lungs (pleura). Because of this inflammation, it hurts to breathe. It can be aggravated by coughing, laughing, or deep breathing. Pleurisy is often caused by an underlying infection or disease.  HOME CARE INSTRUCTIONS  Monitor your pleurisy for any changes. The following actions may help to alleviate any discomfort you are experiencing:  Medicine may help with pain. Only take over-the-counter or prescription medicines for pain, discomfort, or fever as directed by your health care provider.  Only take antibiotic medicine as directed. Make sure to finish it even if you start to feel better. SEEK MEDICAL CARE IF:   Your pain is not controlled with medicine or is increasing.  You have an increase in pus-like (purulent) secretions brought up with coughing. SEEK IMMEDIATE MEDICAL CARE IF:   You have blue or dark lips, fingernails, or toenails.  You are coughing up blood.  You have increased difficulty breathing.  You have continuing pain unrelieved by medicine or pain lasting more than 1 week.  You have pain that radiates into your neck, arms, or jaw.  You develop increased shortness of breath or wheezing.  You develop a fever, rash, vomiting, fainting, or other serious symptoms. MAKE SURE YOU:  Understand these instructions.   Will watch your condition.   Will get help right away if you are not doing well or get worse.    This information is not intended to replace advice given to you by your health care provider. Make sure you discuss any questions you have with your health care provider.   Document Released: 12/01/2005 Document Revised: 08/03/2013 Document Reviewed: 05/15/2013 Elsevier Interactive Patient Education 2016 [employer] Flank Pain Flank pain refers to pain that is located on the side of the body between the upper abdomen and the back. The pain may occur over a short period of time (acute)  or may be long-term or reoccurring (chronic). It may be mild or severe. Flank pain can be caused by many things. CAUSES  Some of the more common causes of flank pain include:  Muscle strains.   Muscle spasms.   A disease of your spine (vertebral disk disease).   A lung infection (pneumonia).   Fluid around your lungs (pulmonary edema).   A kidney infection.   Kidney stones.   A very painful skin rash caused by the chickenpox virus (shingles).   Gallbladder disease.  [place] care will depend on the cause of your pain. In general,  Rest as directed by your caregiver.  Drink enough fluids to keep your urine clear or pale yellow.  Only take over-the-counter or prescription medicines as directed by your caregiver. Some medicines may help relieve the pain.  Tell your caregiver about any changes in your pain.  Follow up with your caregiver as directed. SEEK IMMEDIATE MEDICAL CARE IF:   Your pain is not controlled with medicine.   You have new or worsening symptoms.  Your pain increases.   You have abdominal pain.   You have shortness of breath.   You have persistent nausea or vomiting.   You have swelling in your abdomen.   You feel faint or pass out.   You have blood in your urine.  You have a fever or persistent symptoms for more than 2-3 days.  You have a fever and your symptoms suddenly get worse. MAKE SURE YOU:   Understand these instructions.  Will watch your condition.  Will  get help right away if you are not doing well or get worse.   This information is not intended to replace advice given to you by your health care provider. Make sure you discuss any questions you have with your health care provider.   Document Released: 01/22/2006 Document Revised: 08/25/2012 Document Reviewed: 07/15/2012 Elsevier Interactive Patient Education 2016 [employer]. Constipation, Adult Constipation is when a person has fewer  than three bowel movements a week, has difficulty having a bowel movement, or has stools that are dry, hard, or larger than normal. As people grow older, constipation is more common. A low-fiber diet, not taking in enough fluids, and taking certain medicines may make constipation worse.  CAUSES   Certain medicines, such as antidepressants, pain medicine, iron supplements, antacids, and water pills.   Certain diseases, such as diabetes, irritable bowel syndrome (IBS), thyroid disease, or depression.   Not drinking enough water.   Not eating enough fiber-rich foods.   Stress or travel.   Lack of physical activity or exercise.   Ignoring the urge to have a bowel movement.   Using laxatives too much.  SIGNS AND SYMPTOMS   Having fewer than three bowel movements a week.   Straining to have a bowel movement.   Having stools that are hard, dry, or larger than normal.   Feeling full or bloated.   Pain in the lower abdomen.   Not feeling relief after having a bowel movement.  DIAGNOSIS  Your health care provider will take a medical history and perform a physical exam. Further testing may be done for severe constipation. Some tests may include:  A barium enema X-ray to examine your rectum, colon, and, sometimes, your small intestine.   A sigmoidoscopy to examine your lower colon.   A colonoscopy to examine your entire colon. TREATMENT  Treatment will depend on the severity of your constipation and what is causing it. Some dietary treatments include drinking more fluids and eating more fiber-rich foods. Lifestyle treatments may include regular exercise. If these diet and lifestyle recommendations do not help, your health care provider may recommend taking over-the-counter laxative medicines to help you have bowel movements. Prescription medicines may be prescribed if over-the-counter medicines do not work.  HOME CARE INSTRUCTIONS   Eat foods that have a lot of fiber,  such as fruits, vegetables, whole grains, and beans.  Limit foods high in fat and processed sugars, such as french fries, hamburgers, cookies, candies, and soda.   A fiber supplement may be added to your diet if you cannot get enough fiber from foods.   Drink enough fluids to keep your urine clear or pale yellow.   Exercise regularly or as directed by your health care provider.   Go to the restroom when you have the urge to go. Do not hold it.   Only take over-the-counter or prescription medicines as directed by your health care provider. Do not take other medicines for constipation without talking to your health care provider first.  [place] IF:   You have bright red blood in your stool.   Your constipation lasts for more than 4 days or gets worse.   You have abdominal or rectal pain.   You have thin, pencil-like stools.   You have unexplained weight loss. MAKE SURE YOU:   Understand these instructions.  Will watch your condition.  Will get help right away if you are not doing well or get worse.   This information is not intended  to replace advice given to you by your health care provider. Make sure you discuss any questions you have with your health care provider.   Document Released: 08/29/2004 Document Revised: 12/22/2014 Document Reviewed: 09/12/2013 Elsevier Interactive Patient Education [employer].

## 2015-12-22 NOTE — ED Provider Notes (Signed)
CSN: [id number]     Arrival date & time 12/22/15  0931 History   First MD Initiated Contact with Patient 12/22/15 [phone]     Chief Complaint  Patient presents with  . Flank Pain     (Consider location/radiation/quality/duration/timing/severity/associated sxs/prior Treatment) HPI Patient reports that she awakened early this morning due to pain she was experiencing in her left epigastrium\upper quadrant that radiates around to her flank. She describes the pain as being very sharp in nature. The patient poor she does have problems with reflux and had an episode last week where upon she had epigastric pain and several episodes of vomiting. She reports after the vomiting she felt better. She thought all problems had resolved but this pain came on acutely today and feels different. She denies fevers or chills. Patient does report about 2 weeks ago she had upper respiratory infection and cough. She reports as resolved and she no longer has sputum production or ongoing cough. Pain is made worse by deep inspiration. Patient was treated for pain and nausea on route with fentanyl. She reports that has improved the pain significantly. He denies pain burning or urgency with urination. She does however report she has been started on antibiotic for UTI by her family physician. Past Medical History  Diagnosis Date  . Arthritis     osteoarthritis, hands  . Anxiety     Effexor helps--psychiatrist Dr Toy Care  . Chronic headache   . Mild chronic ulcerative colitis ([place])   . Atypical chest pain     EF 86%, breast attenuation, no significant ischemia  . GERD (gastroesophageal reflux disease) 11/2009    EGD: Dr. Sharlett Iles: erosive stricture dilated  . Migraines   . 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 Osteoporosis 02/2012    t score -2.5 spine  . Palpitations    Past Surgical History  Procedure Laterality Date  . Laser vaporization of upper vagina  1998  . Vaginal hysterectomy  1978   . Colposcopy    . Birth mark removed    . Augmentation mammaplasty    . Nm myocar perf wall motion  01/24/2008    EF 86% neg ischemia   Family History  Problem Relation Age of Onset  . Heart disease Mother   . Heart failure Mother   . Breast cancer Mother 42  . Cancer Father     prostate  . Inflammatory bowel disease Sister   . Diabetes Maternal Aunt   . Cancer Paternal Grandfather     colon   Social History  Substance Use Topics  . Smoking status: Former Smoker -- 1.00 packs/day for 10 years    Types: Cigarettes    Quit date: 12/15/1988  . Smokeless tobacco: Never Used     Comment: quit in the 1980's  . Alcohol Use: 7.0 oz/week    14 Standard drinks or equivalent per week     Comment: 1 glass every few days   OB History    Gravida Para Term Preterm AB TAB SAB Ectopic Multiple Living   1 1 1       1      Review of Systems 10 Systems reviewed and are negative for acute change except as noted in the HPI.    Allergies  Levaquin  Home Medications   Prior to Admission medications   Medication Sig Start Date End Date Taking? Authorizing Provider  azelastine (ASTELIN) 0.1 % nasal spray USE TWO SPRAYS IN EACH NOSTRIL TWICE DAILY AS DIRECTED  03/15/15  Yes Doe-Hyun Kyra Searles, DO  beta carotene w/minerals (OCUVITE) tablet Take 1 tablet by mouth daily.   Yes Historical Provider, MD  Bilberry, Vaccinium myrtillus, (BILBERRY PO) Take 1 capsule by mouth daily.   Yes Historical Provider, MD  cephALEXin (KEFLEX) 500 MG capsule Take 500 mg by mouth 3 (three) times daily.   Yes Historical Provider, MD  fluticasone (FLONASE) 50 MCG/ACT nasal spray USE TWO SPRAY(S) IN EACH NOSTRIL ONCE DAILY 03/15/15  Yes Doe-Hyun R Shawna Orleans, DO  metoprolol tartrate (LOPRESSOR) 25 MG tablet Take 25 mg by mouth daily. For migraines   Yes Historical Provider, MD  SUMAtriptan (IMITREX) 50 MG tablet Take 1 tablet (50 mg total) by mouth as needed. For severe headache. 05/22/15  Yes Marletta Lor, MD  venlafaxine  XR (EFFEXOR-XR) 75 MG 24 hr capsule TAKE ONE CAPSULE BY MOUTH ONCE DAILY WITH  BREAKFAST 10/25/15  Yes Eulas Post, MD  amoxicillin-clavulanate (AUGMENTIN) 875-125 MG tablet Take 1 tablet by mouth every 12 (twelve) hours. Patient not taking: Reported on 12/22/2015 11/13/15   Burnis Medin, MD  diclofenac (VOLTAREN) 75 MG EC tablet TAKE ONE TABLET BY MOUTH TWICE DAILY Patient not taking: Reported on 12/22/2015 11/07/15   Marin Olp, MD  docusate sodium (COLACE) 100 MG capsule Take 1 capsule (100 mg total) by mouth every 12 (twelve) hours. 12/22/15   Charlesetta Shanks, MD  HYDROcodone-homatropine [facility]) 5-1.5 MG/5ML syrup 5 cc q 4-6 hours as needed for cough Patient not taking: Reported on 12/22/2015 10/23/15   Burnis Medin, MD  magnesium citrate SOLN Take 296 mLs (1 Bottle total) by mouth once. 12/22/15   Charlesetta Shanks, MD  methocarbamol (ROBAXIN) 500 MG tablet Take 1 tablet (500 mg total) by mouth 2 (two) times daily as needed. Patient not taking: Reported on 12/22/2015 09/22/13   Doe-Hyun R Shawna Orleans, DO  naproxen (NAPROSYN) 500 MG tablet Take 1 tablet (500 mg total) by mouth 2 (two) times daily. 12/22/15   Charlesetta Shanks, MD  orphenadrine (NORFLEX) 100 MG tablet Take 1 tablet (100 mg total) by mouth 2 (two) times daily. 12/22/15   Charlesetta Shanks, MD  pantoprazole (PROTONIX) 20 MG tablet Take 1 tablet (20 mg total) by mouth daily. 12/22/15   Charlesetta Shanks, MD  polyethylene glycol (MIRALAX / GLYCOLAX) packet Take 17 g by mouth daily. 12/22/15   Charlesetta Shanks, MD   BP 142/67 mmHg  Pulse 78  Temp(Src) 98.3 F (36.8 C) (Oral)  Resp 18  SpO2 98% Physical Exam  Constitutional: She is oriented to person, place, and time. She appears well-developed and well-nourished.  HENT:  Head: Normocephalic and atraumatic.  Eyes: EOM are normal. Pupils are equal, round, and reactive to light.  Neck: Neck supple.  Cardiovascular: Normal rate, regular rhythm, normal heart sounds and intact distal pulses.    Pulmonary/Chest: Effort normal and breath sounds normal. She exhibits no tenderness.  No chest wall rash.  Abdominal: Soft. Bowel sounds are normal. She exhibits no distension. There is tenderness.  Mild epigastric tenderness to palpation.  Musculoskeletal: Normal range of motion. She exhibits no edema or tenderness.  Mild left CVA tenderness.  Neurological: She is alert and oriented to person, place, and time. She has normal strength. No cranial nerve deficit. She exhibits normal muscle tone. Coordination normal. GCS eye subscore is 4. GCS verbal subscore is 5. GCS motor subscore is 6.  Skin: Skin is warm, dry and intact.  Psychiatric: She has a normal mood and affect.  ED Course  Procedures (including critical care time) Labs Review Labs Reviewed  COMPREHENSIVE METABOLIC PANEL - Abnormal; Notable for the following:    Glucose, Bld 113 (*)    All other components within normal limits  D-DIMER, QUANTITATIVE (NOT AT [facility]) - Abnormal; Notable for the following:    D-Dimer, Quant 0.75 (*)    All other components within normal limits  LIPASE, BLOOD  TROPONIN I  CBC WITH DIFFERENTIAL/PLATELET  URINALYSIS, ROUTINE W REFLEX MICROSCOPIC (NOT AT [facility])    Imaging Review Ct Abdomen Pelvis Wo Contrast  12/22/2015  CLINICAL DATA:  Left sided pain. EXAM: CT ABDOMEN AND PELVIS WITHOUT CONTRAST TECHNIQUE: Multidetector CT imaging of the abdomen and pelvis was performed following the standard protocol without IV contrast. COMPARISON:  06/22/2011 FINDINGS: Lower chest: There is no pleural fluid identified. The lung bases appear clear. Hepatobiliary: There is no suspicious liver abnormality. The gallbladder appears normal. Mild increase caliber of the CBD measures up to 8 mm. No intrahepatic bile duct dilatation. Pancreas: The pancreas is negative. Spleen: The spleen is unremarkable.  All Adrenals/Urinary Tract: The adrenal glands are normal. Normal appearance of the right kidney. There is no  right-sided nephrolithiasis or obstructive uropathy. No left renal calculi identified. There is no left-sided hydronephrosis. No ureteral calculi. The urinary bladder is normal. Stomach/Bowel: The stomach is within normal limits. The small bowel loops have a normal course and caliber. No obstruction. Normal appearance of the colon. A moderate stool burden is identified within the colon. The appendix is visualized and appears normal. Multiple distal colonic diverticula noted. No acute inflammation. Vascular/Lymphatic: Calcified atherosclerotic disease involves the abdominal aorta. No aneurysm. No enlarged retroperitoneal or mesenteric adenopathy. No enlarged pelvic or inguinal lymph nodes. Reproductive: Previous hysterectomy.  No adnexal mass identified. Other: No free fluid or fluid collections identified. Musculoskeletal: No aggressive lytic or sclerotic bone lesions identified. IMPRESSION: 1. No acute findings identified within the abdomen or pelvis. 2. There is no nephrolithiasis or obstructive uropathy identified at this time. 3. Aortic atherosclerosis 4. Mild increase caliber of the CBD. Electronically Signed   By: Kerby Moors M.D.   On: 12/22/2015 12:03   Ct Angio Chest Pe W/cm &/or Wo Cm  12/22/2015  CLINICAL DATA:  Elevated D-dimer.  Left-sided chest pain. EXAM: CT ANGIOGRAPHY CHEST WITH CONTRAST TECHNIQUE: Multidetector CT imaging of the chest was performed using the standard protocol during bolus administration of intravenous contrast. Multiplanar CT image reconstructions and MIPs were obtained to evaluate the vascular anatomy. CONTRAST:  133mL OMNIPAQUE IOHEXOL 350 MG/ML SOLN COMPARISON:  None. FINDINGS: There are no filling defects in the pulmonary arterial tree to suggest acute pulmonary thromboembolism. No evidence of abnormal mediastinal adenopathy. No pneumothorax or pleural effusion. Subsegmental atelectasis at the right lung base. There is an 8 mm indeterminate opacity at the anterior right  middle lobe base on image 74. No vertebral compression deformity. Review of the MIP images confirms the above findings. IMPRESSION: No evidence of acute pulmonary thromboembolism. 8 mm right middle lobe possible nodule as described. If the patient is at high risk for bronchogenic carcinoma, follow-up chest CT at 3-59months is recommended. If the patient is at low risk for bronchogenic carcinoma, follow-up chest CT at 6-12 months is recommended. This recommendation follows the consensus statement: Guidelines for Management of Small Pulmonary Nodules Detected on CT Scans: A Statement from the [place] as published in Radiology 2005; 237:395-400. Electronically Signed   By: Marybelle Killings M.D.   On: 12/22/2015 12:02  I have personally reviewed and evaluated these images and lab results as part of my medical decision-making.   EKG Interpretation None      MDM   Final diagnoses:  Flank pain  Pleurisy  Constipation, unspecified constipation type   CT does not identify any distinct abnormality. Consideration is for pleurisy with the patient having history of URI in the past several weeks. Also by my evaluation CT the patient does have significant stool burden. Patient described having constipation and tried over-the-counter medications. She reported a small amount of stool output but at this time based on impression of CT I do believe she has significant constipation still. For control of pleurisy pain patient will take naproxen and Norflex. For constipation she will take daily Colace and the patient already uses MiraLAX as needed. She'll be instructed to take a dose of magnesium citrate to repeat 1 if no response. She is also given Protonix to take for the next one to 2 weeks while she is taking naproxen for pleurisy. She is counseled on signs and symptoms were to return. Patient is discharged in good condition, alert and appropriate.    Charlesetta Shanks, MD 12/22/15 [phone]

## 2015-12-22 NOTE — ED Notes (Signed)
Pt calling out to staff about delay.  Pt advised waiting for labs.  Pt continued to be upset.  Notified MD.  MD spoke with patient and meds/orders given.  Pt continues to be upset with delay and irritated at staff.  Convinced pain is from her bowels, gallbladder or urine although MD has discussed labs and treatment plan with her.  Pt given meds and reassured that testing will occur shortly

## 2015-12-28 ENCOUNTER — Ambulatory Visit (INDEPENDENT_AMBULATORY_CARE_PROVIDER_SITE_OTHER): Payer: PPO | Admitting: Internal Medicine

## 2015-12-28 ENCOUNTER — Encounter: Payer: Self-pay | Admitting: Internal Medicine

## 2015-12-28 VITALS — BP 120/78 | HR 65 | Temp 98.6°F | Wt 131.0 lb

## 2015-12-28 DIAGNOSIS — R911 Solitary pulmonary nodule: Secondary | ICD-10-CM

## 2015-12-28 DIAGNOSIS — G8929 Other chronic pain: Secondary | ICD-10-CM | POA: Diagnosis not present

## 2015-12-28 DIAGNOSIS — Z23 Encounter for immunization: Secondary | ICD-10-CM

## 2015-12-28 DIAGNOSIS — R1013 Epigastric pain: Secondary | ICD-10-CM

## 2015-12-28 DIAGNOSIS — K219 Gastro-esophageal reflux disease without esophagitis: Secondary | ICD-10-CM

## 2015-12-28 MED ORDER — PANTOPRAZOLE SODIUM 40 MG PO TBEC: 40.0000 mg | DELAYED_RELEASE_TABLET | Freq: Every day | ORAL | Status: DC

## 2015-12-28 NOTE — Patient Instructions (Addendum)
Please contact our office if your symptoms do not improve or gets worse. Our office will contact you regarding referral to gastroenterologist - Dr. Ardis Hughs Our office will contact you to schedule follow up CT scan of chest in March, 2017

## 2015-12-28 NOTE — Progress Notes (Signed)
Pre visit review using our clinic review tool, if applicable. No additional management support is needed unless otherwise documented below in the visit note. 

## 2015-12-28 NOTE — Progress Notes (Addendum)
Subjective:    Patient ID: [name], female    DOB: Jan 08, 1941, 75 y.o.   MRN: [id number]  HPI  75 year old white female for emergency room follow-up. Patient seen on 12/22/2015 secondary to left upper quadrant pain. Patient reports wakening 3 in the morning with sharp left-sided flank/abdominal pain. She has history of chronic reflux and reports episodes of vomiting before hospital evaluation. There was also report of having an upper respiratory infection in November 2016. There was concern for possible pleurisy given her symptoms worsened with deep inspiration.  Patient treated with diclofenac and restarted on proton pump inhibitor (Protonix).    Patient's extensive workup in the ER included CT of chest for slightly elevated d-dimer. CT of chest showed subsegmental atelectasis at right lung base. 8 mm indeterminate opacity at the anterior right middle lobe was also noted. CT of abdomen and pelvis was negative for any acute findings. No nephrolithiasis or obstructive uropathy.  There is note of aortic atherosclerosis.  Patient has remote history of tobacco use. She smoked for 10 years.  Patient has history of chronic GERD. She has required esophageal dilation past. Patient does not take proton pump inhibitor and a regular basis. She will take for 4-6 weeks then switched to ranitidine.  Wt Readings from Last 3 Encounters:  12/28/15 131 lb (59.421 kg)  10/23/15 130 lb 11.2 oz (59.285 kg)  09/18/15 130 lb (58.968 kg)     Review of Systems Negative for cough, negative for chest pain, negative for shortness of breath    Past Medical History  Diagnosis Date  . Arthritis     osteoarthritis, hands  . Anxiety     Effexor helps--psychiatrist Dr Toy Care  . Chronic headache   . Mild chronic ulcerative colitis ([place])   . Atypical chest pain     EF 86%, breast attenuation, no significant ischemia  . GERD (gastroesophageal reflux disease) 11/2009    EGD: Dr. Sharlett Iles: erosive stricture  dilated  . Migraines   . 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 Osteoporosis 02/2012    t score -2.5 spine  . Palpitations     Social History   Social History  . Marital Status: Married    Spouse Name: N/A  . Number of Children: N/A  . Years of Education: N/A   Occupational History  . Not on file.   Social History Main Topics  . Smoking status: Former Smoker -- 1.00 packs/day for 10 years    Types: Cigarettes    Quit date: 12/15/1988  . Smokeless tobacco: Never Used     Comment: quit in the 1980's  . Alcohol Use: 7.0 oz/week    14 Standard drinks or equivalent per week     Comment: 1 glass every few days  . Drug Use: No  . Sexual Activity: No     Comment: HYST   Other Topics Concern  . Not on file   Social History Narrative   Married, mother of one.     Very active. Regular exercise  -- yes.  Quit smoking in 1990.     She monitors her diet well.     1-2 glasses of wine almost every night.    Past Surgical History  Procedure Laterality Date  . Laser vaporization of upper vagina  1998  . Vaginal hysterectomy  1978  . Colposcopy    . Birth mark removed    . Augmentation mammaplasty    . Nm myocar perf wall motion  01/24/2008    EF 86% neg ischemia    Family History  Problem Relation Age of Onset  . Heart disease Mother   . Heart failure Mother   . Breast cancer Mother 52  . Cancer Father     prostate  . Inflammatory bowel disease Sister   . Diabetes Maternal Aunt   . Cancer Paternal Grandfather     colon    Allergies  Allergen Reactions  . Levaquin [Levofloxacin In D5w] Nausea Only    Dizziness and tremulousness    Current Outpatient Prescriptions on File Prior to Visit  Medication Sig Dispense Refill  . azelastine (ASTELIN) 0.1 % nasal spray USE TWO SPRAYS IN EACH NOSTRIL TWICE DAILY AS DIRECTED 30 mL 5  . beta carotene w/minerals (OCUVITE) tablet Take 1 tablet by mouth daily.    . Bilberry, Vaccinium myrtillus,  (BILBERRY PO) Take 1 capsule by mouth daily.    Marland Kitchen docusate sodium (COLACE) 100 MG capsule Take 1 capsule (100 mg total) by mouth every 12 (twelve) hours. 60 capsule 0  . fluticasone (FLONASE) 50 MCG/ACT nasal spray USE TWO SPRAY(S) IN EACH NOSTRIL ONCE DAILY 16 g 5  . magnesium citrate SOLN Take 296 mLs (1 Bottle total) by mouth once. 195 mL 1  . methocarbamol (ROBAXIN) 500 MG tablet Take 1 tablet (500 mg total) by mouth 2 (two) times daily as needed. 60 tablet 1  . metoprolol tartrate (LOPRESSOR) 25 MG tablet Take 25 mg by mouth daily. For migraines    . naproxen (NAPROSYN) 500 MG tablet Take 1 tablet (500 mg total) by mouth 2 (two) times daily. 30 tablet 0  . orphenadrine (NORFLEX) 100 MG tablet Take 1 tablet (100 mg total) by mouth 2 (two) times daily. 30 tablet 0  . pantoprazole (PROTONIX) 20 MG tablet Take 1 tablet (20 mg total) by mouth daily. 30 tablet 0  . polyethylene glycol (MIRALAX / GLYCOLAX) packet Take 17 g by mouth daily. 14 each 0  . SUMAtriptan (IMITREX) 50 MG tablet Take 1 tablet (50 mg total) by mouth as needed. For severe headache. 10 tablet 3  . venlafaxine XR (EFFEXOR-XR) 75 MG 24 hr capsule TAKE ONE CAPSULE BY MOUTH ONCE DAILY WITH  BREAKFAST 90 capsule 1   No current facility-administered medications on file prior to visit.    BP 120/78 mmHg  Pulse 65  Temp(Src) 98.6 F (37 C) (Oral)  Wt 131 lb (59.421 kg)    Objective:   Physical Exam  Constitutional: She is oriented to person, place, and time. She appears well-developed and well-nourished.  HENT:  Head: Normocephalic and atraumatic.  Cardiovascular: Normal rate, regular rhythm and normal heart sounds.   No murmur heard. Pulmonary/Chest: Effort normal and breath sounds normal. She has no wheezes.  No chest wall tenderness.  Unable to illicit pain with thoracic side bending, flexion or extension  Abdominal: Soft. Bowel sounds are normal.  Mid epigastric tenderness  Musculoskeletal: She exhibits no edema.    Neurological: She is alert and oriented to person, place, and time. No cranial nerve deficit.  Psychiatric: She has a normal mood and affect. Her behavior is normal.        Assessment & Plan:    1.  Epigastric / Left upper quadrant pain 2.  History of recurrent GERD and esophagitis 3.  Right pulmonary nodule   I doubt patient's left upper quadrant discomfort secondary to pleurisy. Her upper respiratory infection occurred in November 2016. I am much more suspicious that her  symptoms secondary to gastrointestinal etiology. Restart Protonix at 40 mg once daily. Discontinue diclofenac. Refer to Dr. Ardis Hughs for possible EGD.  Consider HIDA scan if persistent symptoms while taking PPI.  74mm right pulmonary nodule incidentally noted on CT of chest to rule out PE. Patient has history of tobacco use. Arrange follow-up CT of chest in 3 months.

## 2015-12-31 ENCOUNTER — Encounter: Payer: Self-pay | Admitting: Gastroenterology

## 2016-01-01 ENCOUNTER — Encounter: Payer: Self-pay | Admitting: Gastroenterology

## 2016-01-09 DIAGNOSIS — J3089 Other allergic rhinitis: Secondary | ICD-10-CM | POA: Diagnosis not present

## 2016-01-09 DIAGNOSIS — H1045 Other chronic allergic conjunctivitis: Secondary | ICD-10-CM | POA: Diagnosis not present

## 2016-01-09 DIAGNOSIS — J301 Allergic rhinitis due to pollen: Secondary | ICD-10-CM | POA: Diagnosis not present

## 2016-01-11 ENCOUNTER — Ambulatory Visit: Payer: PPO | Admitting: Family Medicine

## 2016-02-11 ENCOUNTER — Encounter: Payer: Self-pay | Admitting: Family Medicine

## 2016-02-11 ENCOUNTER — Ambulatory Visit (INDEPENDENT_AMBULATORY_CARE_PROVIDER_SITE_OTHER): Payer: PPO | Admitting: Family Medicine

## 2016-02-11 VITALS — BP 124/68 | Temp 98.7°F | Wt 132.0 lb

## 2016-02-11 DIAGNOSIS — M5416 Radiculopathy, lumbar region: Secondary | ICD-10-CM

## 2016-02-11 NOTE — Patient Instructions (Signed)
Take naproxen as directed/as needed for pain.     Sciatica Sciatica is pain, weakness, numbness, or tingling along your sciatic nerve. The nerve starts in the lower back and runs down the back of each leg. Nerve damage or certain conditions pinch or put pressure on the sciatic nerve. This causes the pain, weakness, and other discomforts of sciatica. HOME CARE   Only take medicine as told by your doctor.  Apply ice to the affected area for 20 minutes. Do this 3-4 times a day for the first 48-72 hours. Then try heat in the same way.  Exercise, stretch, or do your usual activities if these do not make your pain worse.  Go to physical therapy as told by your doctor.  Keep all doctor visits as told.  Do not wear high heels or shoes that are not supportive.  Get a firm mattress if your mattress is too soft to lessen pain and discomfort. GET HELP RIGHT AWAY IF:   You cannot control when you poop (bowel movement) or pee (urinate).  You have more weakness in your lower back, lower belly (pelvis), butt (buttocks), or legs.  You have redness or puffiness (swelling) of your back.  You have a burning feeling when you pee.  You have pain that gets worse when you lie down.  You have pain that wakes you from your sleep.  Your pain is worse than past pain.  Your pain lasts longer than 4 weeks.  You are suddenly losing weight without reason. MAKE SURE YOU:   Understand these instructions.  Will watch this condition.  Will get help right away if you are not doing well or get worse.   This information is not intended to replace advice given to you by your health care provider. Make sure you discuss any questions you have with your health care provider.   Document Released: 09/09/2008 Document Revised: 08/22/2015 Document Reviewed: 04/11/2012 Elsevier Interactive Patient Education [employer].

## 2016-02-11 NOTE — Progress Notes (Signed)
Subjective:     Patient ID: [name], female   DOB: [birth year], 75 y.o.   MRN: [id number]  HPI Acute Visit  Patient seen today with a complaint of left hip pain with radiation to left leg times two weeks.    Past medical history includes hx of cervical radiculopathy, back pain, and GERD. Denies recent injury, swelling, weakness, or foot drop.   No urine or stool incontinence. No sensory impairment Pain is improved with standing or walking. Worsens with sitting or inactivity. Dull ache to sharp and moderate severity. Taken regular over the counter ibuprofen and hydrocodone pain with some relief.  Describes severity of pain as "keeping her awake at night".  Past Medical History  Diagnosis Date  . Arthritis     osteoarthritis, hands  . Anxiety     Effexor helps--psychiatrist Dr Toy Care  . Chronic headache   . Mild chronic ulcerative colitis ([place])   . Atypical chest pain     EF 86%, breast attenuation, no significant ischemia  . GERD (gastroesophageal reflux disease) 11/2009    EGD: Dr. Sharlett Iles: erosive stricture dilated  . Migraines   . 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 Osteoporosis 02/2012    t score -2.5 spine  . Palpitations    Past Surgical History  Procedure Laterality Date  . Laser vaporization of upper vagina  1998  . Vaginal hysterectomy  1978  . Colposcopy    . Birth mark removed    . Augmentation mammaplasty    . Nm myocar perf wall motion  01/24/2008    EF 86% neg ischemia    reports that she quit smoking about 27 years ago. Her smoking use included Cigarettes. She has a 10 pack-year smoking history. She has never used smokeless tobacco. She reports that she drinks about 7.0 oz of alcohol per week. She reports that she does not use illicit drugs. family history includes Breast cancer (age of onset: 35) in her mother; Cancer in her father and paternal grandfather; Diabetes in her maternal aunt; Heart disease in her mother;  Heart failure in her mother; Inflammatory bowel disease in her sister. Allergies  Allergen Reactions  . Levaquin [Levofloxacin In D5w] Nausea Only    Dizziness and tremulousness     Review of Systems  Constitutional: Negative.   HENT: Negative.   Respiratory: Negative.   Cardiovascular: Negative.   Gastrointestinal: Negative.   Endocrine: Negative.   Genitourinary: Negative.   Musculoskeletal:       See HPI  Skin: Negative.   Allergic/Immunologic: Negative.   Neurological: Negative.  Negative for weakness.  Hematological: Negative.   Psychiatric/Behavioral: Negative.        Objective:   Physical Exam  Constitutional: She is oriented to person, place, and time. She appears well-developed and well-nourished.  HENT:  Head: Normocephalic and atraumatic.  Right Ear: External ear normal.  Left Ear: External ear normal.  Eyes: Conjunctivae and EOM are normal. Pupils are equal, round, and reactive to light.  Neck: Normal range of motion. Neck supple.  Cardiovascular: Normal rate, regular rhythm, normal heart sounds and intact distal pulses.   Pulmonary/Chest: Effort normal and breath sounds normal.  Abdominal: Soft. Bowel sounds are normal.  Musculoskeletal: Normal range of motion.  Neurological: She is alert and oriented to person, place, and time. She has normal reflexes.  Skin: Skin is warm and dry.       Assessment:  Left hip pain with radiation to left leg- suspect left  lumbar radiculitis Non-focal neurologic exam.   Plan:   Sciatica-Naproxen to reduce inflammation (patient has an existing prescription from another provider).   (Patient reports hyperactivity when taken prednisone in the past.) Avoid heavy lifting.  If pain continue will consider further diagnostic studies to rule out nerve impingment.  Follow-up as needed.

## 2016-02-20 ENCOUNTER — Ambulatory Visit: Payer: PPO | Admitting: Gastroenterology

## 2016-02-20 ENCOUNTER — Ambulatory Visit (INDEPENDENT_AMBULATORY_CARE_PROVIDER_SITE_OTHER): Payer: PPO | Admitting: Gastroenterology

## 2016-02-20 ENCOUNTER — Encounter: Payer: Self-pay | Admitting: Gastroenterology

## 2016-02-20 VITALS — BP 104/60 | HR 70 | Ht 65.0 in | Wt 130.2 lb

## 2016-02-20 DIAGNOSIS — K219 Gastro-esophageal reflux disease without esophagitis: Secondary | ICD-10-CM

## 2016-02-20 DIAGNOSIS — R1310 Dysphagia, unspecified: Secondary | ICD-10-CM | POA: Diagnosis not present

## 2016-02-20 NOTE — Progress Notes (Signed)
HPI: This is a very pleasant 75 year old woman    who was referred to me by Shawna Orleans, Doe-Hyun R, DO  to evaluate  intermittent dysphasia, chronic GERD .    Chief complaint is intermittent dysphasia, chronic GERD   Dr Shawna Orleans asked her to come in for reflux problems, vomiting.  She has been on protonix once daily.  She had been taking nexium prior to that.  She has  Vomiting often about 30 minutes after eating.  This has been going on for many years.  She had been taking nexium for about a month at a time with good relief of symptoms.  protonix in AM 30 min before breakfast.  No NSAIDs.  Overall stable weight.  + dysphagia to solid foods, meat and bread especially.  Would occur 2-3 times per week.  Protonix does seem to help this since she has been on it regularly for the past month  2010 EGD Dr. Sharlett Iles: done for "gerd, chest pain, dyspahgia: erosive esophagitis and "probable occult stricture at GE junction" dilated with 52 Fr 06/2008 colonoscopy Dr. Jonathon Bellows colitis witherosions and submucosal hemorrhage ...very MILD inflammation...nobleeding,stenosis. fistulae, etc.. Comments:Segmental colitis....symptoms exceed objective findings in terms ofpain, degree of diarrhea c/w degree also of irritable bowel syndrome. Prior serologies are c/w IBD.  Marland Kitchen COLON, RIGHT, RANDOM BIOPSIES: - FOCAL CHRONIC ACTIVE MUCOSAL COLITIS. - NO GRANULOMAS OR DYSPLASIA IDENTIFIED.  3. COLON, LEFT, RANDOM BIOPSIES: - BENIGN COLONIC MUCOSA WITH FOCAL REACTIVE LYMPHOID AGGREGATES.  - NO ACTIVE MUCOSAL INFLAMMATION, GRANULOMAS OR DYSPLASIA IDENTIFIED.  COMMENT 2. 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 The adjacent lamina propria shows an increase in lymphocytes and plasma cells including a few intraepithelial lymphocytes. No granulomas are seen and no dysplasia is identified. Although these changes may be seen in   so-called lymphocytic (microscopic colitis), I believe the prime differential diagnostic consideration is Crohn's disease. Clinical and endoscopic correlation would be helpful.  3. There is patchy increase in inflammatory cells in the lamina propria consisting primarily of lymphocytes and plasma cells with no increase in intraepithelial lymphocytes. No active mucosal inflammation is identified in the left colon biopsies. No dysplasia is seen. (EA:mw, 06/23/08)  Review of systems: Pertinent positive and negative review of systems were noted in the above HPI section. Complete review of systems was performed and was otherwise normal.   Past Medical History  Diagnosis Date  . Arthritis     osteoarthritis, hands  . Anxiety     Effexor helps--psychiatrist Dr Toy Care  . Chronic headache   . Mild chronic ulcerative colitis ([place])   . Atypical chest pain     EF 86%, breast attenuation, no significant ischemia  . GERD (gastroesophageal reflux disease) 11/2009    EGD: Dr. Sharlett Iles: erosive stricture dilated  . Migraines   . 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 Osteoporosis 02/2012    t score -2.5 spine  . Palpitations     Past Surgical History  Procedure Laterality Date  . Laser vaporization of upper vagina  1998  . Vaginal hysterectomy  1978  . Colposcopy    . Birth mark removed    . Augmentation mammaplasty    . Nm myocar perf wall motion  01/24/2008    EF 86% neg ischemia    Current Outpatient Prescriptions  Medication Sig Dispense Refill  . azelastine (ASTELIN) 0.1 % nasal spray USE TWO SPRAYS IN EACH NOSTRIL TWICE DAILY AS DIRECTED 30 mL 5  .  beta carotene w/minerals (OCUVITE) tablet Take 1 tablet by mouth daily.    . Bilberry, Vaccinium myrtillus, (BILBERRY PO) Take 1 capsule by mouth daily.    Marland Kitchen docusate sodium (COLACE) 100 MG capsule Take 1 capsule (100 mg total) by mouth every 12 (twelve) hours. 60 capsule 0  . fluticasone (FLONASE)  50 MCG/ACT nasal spray USE TWO SPRAY(S) IN EACH NOSTRIL ONCE DAILY 16 g 5  . magnesium citrate SOLN Take 296 mLs (1 Bottle total) by mouth once. 195 mL 1  . metoprolol tartrate (LOPRESSOR) 25 MG tablet Take 25 mg by mouth daily. For migraines    . Multiple Vitamins-Minerals (ICAPS LUTEIN & ZEAXANTHIN PO) Take by mouth.    . pantoprazole (PROTONIX) 40 MG tablet Take 1 tablet (40 mg total) by mouth daily. 90 tablet 1  . polyethylene glycol (MIRALAX / GLYCOLAX) packet Take 17 g by mouth daily. 14 each 0  . SUMAtriptan (IMITREX) 50 MG tablet Take 1 tablet (50 mg total) by mouth as needed. For severe headache. 10 tablet 3  . venlafaxine XR (EFFEXOR-XR) 75 MG 24 hr capsule TAKE ONE CAPSULE BY MOUTH ONCE DAILY WITH  BREAKFAST 90 capsule 1   No current facility-administered medications for this visit.    Allergies as of 02/20/2016 - Review Complete 02/20/2016  Allergen Reaction Noted  . Levaquin [levofloxacin in d5w] Nausea Only 12/01/2013    Family History  Problem Relation Age of Onset  . Heart disease Mother   . Heart failure Mother   . Breast cancer Mother 25  . Cancer Father     prostate  . Inflammatory bowel disease Sister   . Diabetes Maternal Aunt   . Cancer Paternal Grandfather     colon    Social History   Social History  . Marital Status: Married    Spouse Name: N/A  . Number of Children: N/A  . Years of Education: N/A   Occupational History  . Not on file.   Social History Main Topics  . Smoking status: Former Smoker -- 1.00 packs/day for 10 years    Types: Cigarettes    Quit date: 12/15/1988  . Smokeless tobacco: Never Used     Comment: quit in the 1980's  . Alcohol Use: 7.0 oz/week    14 Standard drinks or equivalent per week     Comment: 1 glass every few days  . Drug Use: No  . Sexual Activity: No     Comment: HYST   Other Topics Concern  . Not on file   Social History Narrative   Married, mother of one.     Very active. Regular exercise  -- yes.   Quit smoking in 1990.     She monitors her diet well.     1-2 glasses of wine almost every night.     Physical Exam: BP 104/60 mmHg  Pulse 70  Ht 5\' 5"  (1.651 m)  Wt 130 lb 4 oz (59.081 kg)  BMI 21.67 kg/m2 Constitutional: generally well-appearing Psychiatric: alert and oriented x3 Eyes: extraocular movements intact Mouth: oral pharynx moist, no lesions Neck: supple no lymphadenopathy Cardiovascular: heart regular rate and rhythm Lungs: clear to auscultation bilaterally Abdomen: soft, nontender, nondistended, no obvious ascites, no peritoneal signs, normal bowel sounds Extremities: no lower extremity edema bilaterally Skin: no lesions on visible extremities   Assessment and plan: 75 y.o. female with  intermittent solid food dysphagia, GERD  I think her symptoms are likely acid, GERD related. I recommended we proceed with EGD  at her soonest convenience to rule out other causes such as neoplasm, significant stricture which I think is unlikely. She will continue Protonix now, indefinitely. She knows it is best to take 20-30 minutes before breakfast meal on a daily basis. I see no reason for any further blood tests or imaging studies at this point   Owens Loffler, MD [facility] 02/20/2016, 2:38 PM  Cc: Shawna Orleans, Doe-Hyun R, DO

## 2016-02-20 NOTE — Patient Instructions (Signed)
You will be set up for an upper endoscopy with dilation. Stay on protonix 30 min before breakfast every day.

## 2016-03-13 DIAGNOSIS — M65332 Trigger finger, left middle finger: Secondary | ICD-10-CM | POA: Diagnosis not present

## 2016-03-21 ENCOUNTER — Ambulatory Visit (INDEPENDENT_AMBULATORY_CARE_PROVIDER_SITE_OTHER)
Admission: RE | Admit: 2016-03-21 | Discharge: 2016-03-21 | Disposition: A | Discharge status: Home / Self Care | Payer: PPO | Source: Ambulatory Visit | Attending: Internal Medicine | Admitting: Internal Medicine

## 2016-03-21 DIAGNOSIS — R911 Solitary pulmonary nodule: Secondary | ICD-10-CM

## 2016-04-11 ENCOUNTER — Other Ambulatory Visit: Payer: Self-pay | Admitting: Family Medicine

## 2016-04-11 ENCOUNTER — Other Ambulatory Visit: Payer: Self-pay | Admitting: Internal Medicine

## 2016-04-14 ENCOUNTER — Encounter: Payer: Self-pay | Admitting: Gastroenterology

## 2016-04-14 ENCOUNTER — Ambulatory Visit (AMBULATORY_SURGERY_CENTER): Payer: PPO | Admitting: Gastroenterology

## 2016-04-14 VITALS — BP 116/59 | HR 65 | Temp 98.6°F | Resp 13 | Ht 65.0 in | Wt 130.0 lb

## 2016-04-14 DIAGNOSIS — I10 Essential (primary) hypertension: Secondary | ICD-10-CM | POA: Diagnosis not present

## 2016-04-14 DIAGNOSIS — R1310 Dysphagia, unspecified: Secondary | ICD-10-CM

## 2016-04-14 DIAGNOSIS — K219 Gastro-esophageal reflux disease without esophagitis: Secondary | ICD-10-CM | POA: Diagnosis not present

## 2016-04-14 MED ORDER — SODIUM CHLORIDE 0.9 % IV SOLN: 500.0000 mL | INTRAVENOUS | Status: DC

## 2016-04-14 NOTE — Op Note (Signed)
[facility] Patient Name: [name] Procedure Date: 04/14/2016 9:50 AM MRN: [id number] Endoscopist: Milus Banister , MD Age: 75 Date of Birth: [birth year] Gender: Female Procedure:                Upper GI endoscopy Indications:              Dysphagia Medicines:                Monitored Anesthesia Care Procedure:                Pre-Anesthesia Assessment:                           - Prior to the procedure, a History and Physical                            was performed, and patient medications and                            allergies were reviewed. The patient's tolerance of                            previous anesthesia was also reviewed. The risks                            and benefits of the procedure and the sedation                            options and risks were discussed with the patient.                            All questions were answered, and informed consent                            was obtained. Prior Anticoagulants: The patient has                            taken no previous anticoagulant or antiplatelet                            agents. ASA Grade Assessment: II - A patient with                            mild systemic disease. After reviewing the risks                            and benefits, the patient was deemed in                            satisfactory condition to undergo the procedure.                           After obtaining informed consent, the endoscope was  passed under direct vision. Throughout the                            procedure, the patient's blood pressure, pulse, and                            oxygen saturations were monitored continuously. The                            Model GIF-[id number]) scope was introduced                            through the mouth, and advanced to the second part                            of duodenum. The upper GI endoscopy was                            accomplished  without difficulty. The patient                            tolerated the procedure well. Scope In: Scope Out: Findings:                 The esophagus was normal.                           The stomach was normal.                           The examined duodenum was normal. Complications:            No immediate complications. Estimated blood loss:                            None. Estimated Blood Loss:     Estimated blood loss: none. Impression:               - Normal esophagus.                           - Normal stomach.                           - Normal examined duodenum.                           - No specimens collected. Recommendation:           - Patient has a contact number available for                            emergencies. The signs and symptoms of potential                            delayed complications were discussed with the  patient. Return to normal activities tomorrow.                            Written discharge instructions were provided to the                            patient.                           - Resume previous diet.                           - Continue present medications. Stay on protonix                            once daily, it seems to be helping your symptoms                            very well.                           - No repeat upper endoscopy. Milus Banister, MD 04/14/2016 9:57:52 AM This report has been signed electronically.

## 2016-04-14 NOTE — Patient Instructions (Signed)
Normal exam today. Resume current medications.  Call us with any questions or concerns. Thank you!   YOU HAD AN ENDOSCOPIC PROCEDURE TODAY AT [facility]:   Refer to the procedure report that was given to you for any specific questions about what was found during the examination.  If the procedure report does not answer your questions, please call your gastroenterologist to clarify.  If you requested that your care partner not be given the details of your procedure findings, then the procedure report has been included in a sealed envelope for you to review at your convenience later.  YOU SHOULD EXPECT: Some feelings of bloating in the abdomen. Passage of more gas than usual.  Walking can help get rid of the air that was put into your GI tract during the procedure and reduce the bloating. If you had a lower endoscopy (such as a colonoscopy or flexible sigmoidoscopy) you may notice spotting of blood in your stool or on the toilet paper. If you underwent a bowel prep for your procedure, you may not have a normal bowel movement for a few days.  Please Note:  You might notice some irritation and congestion in your nose or some drainage.  This is from the oxygen used during your procedure.  There is no need for concern and it should clear up in a day or so.  SYMPTOMS TO REPORT IMMEDIATELY:    Following upper endoscopy (EGD)  Vomiting of blood or coffee ground material  New chest pain or pain under the shoulder blades  Painful or persistently difficult swallowing  New shortness of breath  Fever of 100F or higher  Black, tarry-looking stools  For urgent or emergent issues, a gastroenterologist can be reached at any hour by calling [phone].   DIET: Your first meal following the procedure should be a small meal and then it is ok to progress to your normal diet. Heavy or fried foods are harder to digest and may make you feel nauseous or bloated.  Likewise, meals heavy in dairy  and vegetables can increase bloating.  Drink plenty of fluids but you should avoid alcoholic beverages for 24 hours.  ACTIVITY:  You should plan to take it easy for the rest of today and you should NOT DRIVE or use heavy machinery until tomorrow (because of the sedation medicines used during the test).    FOLLOW UP: Our staff will call the number listed on your records the next business day following your procedure to check on you and address any questions or concerns that you may have regarding the information given to you following your procedure. If we do not reach you, we will leave a message.  However, if you are feeling well and you are not experiencing any problems, there is no need to return our call.  We will assume that you have returned to your regular daily activities without incident.  If any biopsies were taken you will be contacted by phone or by letter within the next 1-3 weeks.  Please call us at [phone] if you have not heard about the biopsies in 3 weeks.    SIGNATURES/CONFIDENTIALITY: You and/or your care partner have signed paperwork which will be entered into your electronic medical record.  These signatures attest to the fact that that the information above on your After Visit Summary has been reviewed and is understood.  Full responsibility of the confidentiality of this discharge information lies with you and/or your care-partner.

## 2016-04-14 NOTE — Progress Notes (Signed)
Report to PACU, RN, vss, BBS= Clear.  

## 2016-04-15 ENCOUNTER — Telehealth: Payer: Self-pay | Admitting: *Deleted

## 2016-04-15 NOTE — Telephone Encounter (Signed)
  Follow up Call-  Call back number 04/14/2016  Post procedure Call Back phone  # [phone]  Permission to leave phone message Yes     Patient questions:  Do you have a fever, pain , or abdominal swelling? No. Pain Score  0 *  Have you tolerated food without any problems? Yes.    Have you been able to return to your normal activities? Yes.    Do you have any questions about your discharge instructions: Diet   No. Medications  No. Follow up visit  No.  Do you have questions or concerns about your Care? No.  Actions: * If pain score is 4 or above: No action needed, pain <4.

## 2016-07-01 ENCOUNTER — Encounter: Payer: Self-pay | Admitting: Family Medicine

## 2016-07-01 ENCOUNTER — Ambulatory Visit (INDEPENDENT_AMBULATORY_CARE_PROVIDER_SITE_OTHER): Payer: PPO | Admitting: Family Medicine

## 2016-07-01 VITALS — BP 108/62 | HR 81 | Temp 98.1°F | Ht 65.0 in | Wt 130.9 lb

## 2016-07-01 DIAGNOSIS — M79605 Pain in left leg: Secondary | ICD-10-CM

## 2016-07-01 DIAGNOSIS — F411 Generalized anxiety disorder: Secondary | ICD-10-CM

## 2016-07-01 DIAGNOSIS — Z7689 Persons encountering health services in other specified circumstances: Secondary | ICD-10-CM

## 2016-07-01 DIAGNOSIS — R002 Palpitations: Secondary | ICD-10-CM

## 2016-07-01 DIAGNOSIS — J302 Other seasonal allergic rhinitis: Secondary | ICD-10-CM | POA: Diagnosis not present

## 2016-07-01 DIAGNOSIS — Z7189 Other specified counseling: Secondary | ICD-10-CM | POA: Diagnosis not present

## 2016-07-01 DIAGNOSIS — G43809 Other migraine, not intractable, without status migrainosus: Secondary | ICD-10-CM

## 2016-07-01 NOTE — Patient Instructions (Signed)
BEFORE YOU LEAVE: -patellofemoral exercises -follow up: in 1-3 months on a Friday for follow up with Dr. Maudie Mercury and Medicare exam with Manuela Schwartz - come fasting if you can.   For the knee and leg pain: -do the exercises 4 days per week -ice when having pain -ibuprofen on if really needed per dosing instructions, limit this   We recommend the following healthy lifestyle: 1) Small portions - eat off of salad plate instead of dinner plate 2) Eat a healthy clean diet with avoidance of (less then 1 serving per week) processed foods, sweetened drinks, white starches, red meat, fast foods and sweets and consisting of: * 5-9 servings per day of fresh or frozen fruits and vegetables (not corn or potatoes, not dried or canned) *nuts and seeds, beans *olives and olive oil *small portions of lean meats such as fish and white chicken  *small portions of whole grains 3)Get at least 150 minutes of sweaty aerobic exercise per week 4)reduce stress - counseling, meditation, relaxation to balance other aspects of your life

## 2016-07-01 NOTE — Progress Notes (Signed)
HPI:  [name] is here to establish care.  Prior patient of Dr. Shawna Orleans. Last PCP and physical: sees Paula Compton for gyn  Has the following chronic problems that require follow up and concerns today:  L knee pain: -x2 weeks -walks a lot and does exercises for channel 13 -pain all around knee and some pain anteromed leg above knee -no weakness, numbness, clicking, catching, malaise, fevers, bowel or bladder dysfunction -can't thinks of anything new, but walks 4 big dogs daily and stumbles or gets tugged frequently -prefers to treat without meds if possible  Palpitations/Migraines: -started under time of stress and reports eval with cardiology with bb added for palpitations and migraines - reports greatly reduced migraine and palpitation frequency -on metoprolol for this -imitrex prn  GAD/Depression: -chronic, started with death of family member -on effexor -reports has done well on this -no depression, still a little anxious  Osteoporosis: -reports resolved with exercise and vit D  UC/GERD with esophagitis/Chronic constipation: -managed by GI  ROS negative for unless reported above: fevers, unintentional weight loss, hearing or vision loss, chest pain, palpitations, struggling to breath, hemoptysis, melena, hematochezia, hematuria, falls, loc, si, thoughts of self harm  Past Medical History  Diagnosis Date  . Arthritis     osteoarthritis, hands  . Anxiety     Effexor helps--psychiatrist Dr Toy Care  . Chronic headache   . Mild chronic ulcerative colitis ([place])   . Atypical chest pain     EF 86%, breast attenuation, no significant ischemia  . GERD (gastroesophageal reflux disease) 11/2009    EGD: Dr. Sharlett Iles: erosive stricture dilated  . Migraines   . 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 Osteoporosis 02/2012    t score -2.5 spine  . Palpitations   . ISCHEMIC COLITIS 06/05/2008    Qualifier: Diagnosis of  By: Nils Pyle CMA ([place]),  Mearl Latin    . Esophagitis 01/07/2010    Qualifier: Diagnosis of  By: Surface RN, Butch Penny    . Cervical radiculopathy 11/18/2013  . Rotator cuff tendonitis 05/26/2011  . Actinic keratosis 12/03/2012    Past Surgical History  Procedure Laterality Date  . Laser vaporization of upper vagina  1998  . Vaginal hysterectomy  1978  . Colposcopy    . Birth mark removed    . Augmentation mammaplasty    . Nm myocar perf wall motion  01/24/2008    EF 86% neg ischemia    Family History  Problem Relation Age of Onset  . Heart disease Mother   . Heart failure Mother   . Breast cancer Mother 69  . Cancer Father     prostate  . Inflammatory bowel disease Sister   . Diabetes Maternal Aunt   . Cancer Paternal Grandfather     colon    Social History   Social History  . Marital Status: Married    Spouse Name: N/A  . Number of Children: N/A  . Years of Education: N/A   Social History Main Topics  . Smoking status: Former Smoker -- 1.00 packs/day for 10 years    Types: Cigarettes    Quit date: 12/15/1988  . Smokeless tobacco: Never Used     Comment: quit in the 1980's  . Alcohol Use: 7.0 oz/week    14 Standard drinks or equivalent per week     Comment: 1 glass every few days  . Drug Use: No  . Sexual Activity: No     Comment: HYST   Other  Topics Concern  . None   Social History Narrative   Married, mother of one.  Lives with spouse and 4 dogs.   Very active. Regular exercise  -- yes.  Quit smoking in 1990.     She monitors her diet well.     1-2 glasses of wine almost every night.     Current outpatient prescriptions:  .  azelastine (ASTELIN) 0.1 % nasal spray, USE TWO SPRAYS IN EACH NOSTRIL TWICE DAILY AS DIRECTED, Disp: 30 mL, Rfl: 5 .  beta carotene w/minerals (OCUVITE) tablet, Take 1 tablet by mouth daily., Disp: , Rfl:  .  Bilberry, Vaccinium myrtillus, (BILBERRY PO), Take 1 capsule by mouth daily., Disp: , Rfl:  .  docusate sodium (COLACE) 100 MG capsule, Take 1 capsule  (100 mg total) by mouth every 12 (twelve) hours., Disp: 60 capsule, Rfl: 0 .  fluticasone (FLONASE) 50 MCG/ACT nasal spray, USE TWO SPRAY(S) IN EACH NOSTRIL ONCE DAILY, Disp: 16 g, Rfl: 5 .  magnesium citrate SOLN, Take 296 mLs (1 Bottle total) by mouth once., Disp: 195 mL, Rfl: 1 .  metoprolol tartrate (LOPRESSOR) 25 MG tablet, TAKE ONE TABLET BY MOUTH TWICE DAILY, Disp: 180 tablet, Rfl: 0 .  Multiple Vitamins-Minerals (ICAPS LUTEIN & ZEAXANTHIN PO), Take by mouth., Disp: , Rfl:  .  pantoprazole (PROTONIX) 40 MG tablet, Take 1 tablet (40 mg total) by mouth daily., Disp: 90 tablet, Rfl: 1 .  polyethylene glycol (MIRALAX / GLYCOLAX) packet, Take 17 g by mouth daily., Disp: 14 each, Rfl: 0 .  SUMAtriptan (IMITREX) 50 MG tablet, Take 1 tablet (50 mg total) by mouth as needed. For severe headache., Disp: 10 tablet, Rfl: 3 .  venlafaxine XR (EFFEXOR-XR) 75 MG 24 hr capsule, TAKE ONE CAPSULE BY MOUTH ONCE DAILY WITH  BREAKFAST., Disp: 90 capsule, Rfl: 1  EXAM:  Filed Vitals:   07/01/16 1600  BP: 108/62  Pulse: 81  Temp: 98.1 F (36.7 C)    Body mass index is 21.78 kg/(m^2).  GENERAL: vitals reviewed and listed above, alert, oriented, appears well hydrated and in no acute distress  HEENT: atraumatic, conjunttiva clear, no obvious abnormalities on inspection of external nose and ears  NECK: no obvious masses on inspection  LUNGS: clear to auscultation bilaterally, no wheezes, rales or rhonchi, good air movement  CV: HRRR, no peripheral edema  MS: moves all extremities without noticeable abnormality, mildly antalgic gait, normal inspection of both legs, no effusion of the knee, TTP in the L quad tendons just above knee, petellar crepitus bilat, neg lachman, neg drawer testing, neg mcmurry, NV intact distal, no TTP over the deep veins  PSYCH: pleasant and cooperative, no obvious depression or anxiety  ASSESSMENT AND PLAN:  Discussed the following assessment and plan: More than 50% of  over 40 minutes spent in total in caring for this patient was spent face-to-face with the patient, counseling and/or coordinating care.   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  Seasonal allergies -Stable  Palpitations -continue current treatment -Stable  Other migraine without status migrainosus, not intractable -continue current treatment -Stable  Generalized anxiety disorder -continue current treatment -Stable  Encounter to establish care -We reviewed the PMH, PSH, FH, SH, Meds and Allergies. -We provided refills for any medications we will prescribe as needed. -We addressed current concerns per orders and patient instructions. -We have asked for records for pertinent exams, studies, vaccines and notes from previous providers. -We have advised patient to follow up per instructions below.   -Patient advised to return or notify a doctor immediately if symptoms worsen or persist or new concerns arise.  Patient Instructions  BEFORE YOU LEAVE: -patellofemoral exercises -follow up: in 1-3 months on a Friday for follow up with Dr. Maudie Mercury and Medicare exam with Manuela Schwartz - come fasting if you can.   For the knee and leg pain: -do the exercises 4 days per week -ice when having pain -ibuprofen on if really needed per dosing instructions, limit this   We recommend the following healthy lifestyle: 1) Small portions - eat off of  salad plate instead of dinner plate 2) Eat a healthy clean diet with avoidance of (less then 1 serving per week) processed foods, sweetened drinks, white starches, red meat, fast foods and sweets and consisting of: * 5-9 servings per day of fresh or frozen fruits and vegetables (not corn or potatoes, not dried or canned) *nuts and seeds, beans *olives and olive oil *small portions of lean meats such as fish and white chicken  *small portions of whole grains 3)Get at least 150 minutes of sweaty aerobic exercise per week 4)reduce stress - counseling, meditation, relaxation to balance other aspects of your life       Lucretia Kern.

## 2016-07-01 NOTE — Progress Notes (Signed)
Pre visit review using our clinic review tool, if applicable. No additional management support is needed unless otherwise documented below in the visit note. 

## 2016-07-04 ENCOUNTER — Telehealth: Payer: Self-pay | Admitting: Family Medicine

## 2016-07-04 MED ORDER — SUMATRIPTAN SUCCINATE 50 MG PO TABS: 50.0000 mg | ORAL_TABLET | ORAL | Status: DC | PRN

## 2016-07-04 NOTE — Telephone Encounter (Signed)
Pt needs a refill on sumatriptan . [facility] [address]

## 2016-07-07 ENCOUNTER — Other Ambulatory Visit: Payer: Self-pay | Admitting: Obstetrics and Gynecology

## 2016-07-07 DIAGNOSIS — Z1231 Encounter for screening mammogram for malignant neoplasm of breast: Secondary | ICD-10-CM

## 2016-07-07 DIAGNOSIS — Z9882 Breast implant status: Secondary | ICD-10-CM

## 2016-07-08 ENCOUNTER — Other Ambulatory Visit: Payer: Self-pay | Admitting: Internal Medicine

## 2016-07-15 ENCOUNTER — Ambulatory Visit
Admission: RE | Admit: 2016-07-15 | Discharge: 2016-07-15 | Disposition: A | Discharge status: Home / Self Care | Payer: PPO | Source: Ambulatory Visit | Attending: Obstetrics and Gynecology | Admitting: Obstetrics and Gynecology

## 2016-07-15 DIAGNOSIS — Z1231 Encounter for screening mammogram for malignant neoplasm of breast: Secondary | ICD-10-CM

## 2016-07-15 DIAGNOSIS — Z9882 Breast implant status: Secondary | ICD-10-CM

## 2016-07-15 LAB — HM MAMMOGRAPHY

## 2016-07-30 ENCOUNTER — Encounter: Payer: Self-pay | Admitting: Family Medicine

## 2016-08-08 ENCOUNTER — Ambulatory Visit: Payer: PPO

## 2016-08-08 ENCOUNTER — Ambulatory Visit: Payer: PPO | Admitting: Family Medicine

## 2016-08-15 ENCOUNTER — Encounter: Payer: Self-pay | Admitting: Family Medicine

## 2016-08-15 ENCOUNTER — Ambulatory Visit (INDEPENDENT_AMBULATORY_CARE_PROVIDER_SITE_OTHER): Payer: PPO | Admitting: Family Medicine

## 2016-08-15 VITALS — BP 142/74 | HR 83 | Temp 98.5°F | Ht 65.0 in | Wt 130.2 lb

## 2016-08-15 DIAGNOSIS — K219 Gastro-esophageal reflux disease without esophagitis: Secondary | ICD-10-CM | POA: Diagnosis not present

## 2016-08-15 DIAGNOSIS — IMO0001 Reserved for inherently not codable concepts without codable children: Secondary | ICD-10-CM

## 2016-08-15 DIAGNOSIS — R300 Dysuria: Secondary | ICD-10-CM

## 2016-08-15 DIAGNOSIS — E785 Hyperlipidemia, unspecified: Secondary | ICD-10-CM | POA: Diagnosis not present

## 2016-08-15 DIAGNOSIS — M2550 Pain in unspecified joint: Secondary | ICD-10-CM | POA: Diagnosis not present

## 2016-08-15 DIAGNOSIS — R030 Elevated blood-pressure reading, without diagnosis of hypertension: Secondary | ICD-10-CM

## 2016-08-15 LAB — URINALYSIS, ROUTINE W REFLEX MICROSCOPIC
Bilirubin Urine: NEGATIVE
Hgb urine dipstick: NEGATIVE
Ketones, ur: NEGATIVE
Nitrite: NEGATIVE
SPECIFIC GRAVITY, URINE: 1.01 (ref 1.000–1.030)
TOTAL PROTEIN, URINE-UPE24: NEGATIVE
URINE GLUCOSE: NEGATIVE
Urobilinogen, UA: 0.2 (ref 0.0–1.0)
pH: 7.5 (ref 5.0–8.0)

## 2016-08-15 LAB — LIPID PANEL
CHOL/HDL RATIO: 3
Cholesterol: 226 mg/dL — ABNORMAL HIGH (ref 0–200)
HDL: 71.7 mg/dL (ref >39.00)
LDL Cholesterol: 140 mg/dL — ABNORMAL HIGH (ref 0–99)
NONHDL: 154.62
TRIGLYCERIDES: 74 mg/dL (ref 0.0–149.0)
VLDL: 14.8 mg/dL (ref 0.0–40.0)

## 2016-08-15 LAB — HEMOGLOBIN A1C: HEMOGLOBIN A1C: 5.6 % (ref 4.6–6.5)

## 2016-08-15 LAB — POCT URINALYSIS DIPSTICK
Bilirubin, UA: NEGATIVE
Glucose, UA: NEGATIVE
Ketones, UA: NEGATIVE
Nitrite, UA: NEGATIVE
PROTEIN UA: NEGATIVE
SPEC GRAV UA: 1.01
UROBILINOGEN UA: 0.2
pH, UA: 7.5

## 2016-08-15 NOTE — Progress Notes (Addendum)
HPI:  Follow up multiple problems:  L knee pain: -resolved with hep and chiropratic adjustment  Dysuria: -x several days -symptoms: dysuria, frequency, urgency -no fevers, malaise, hematuria, vomiting -did have stomach bug last week with diarrhea for a few days now resolved  Dysphagia/Vomiting/GERD/Espohpagitis: -referred to Dr. Ardis Hughs for this -she is upset today as she felt like I told her to see Dr. Ardis Hughs for this and wants me to manage - she reports " Dr. Shawna Orleans used to take care of everything for me" (of note: Dr. Shawna Orleans referred her to GI)  Polyarthralgia: -mainly fingers and back -wants rheumatoid labs -no fevers, malaise, swelling  Hyperlipidemia: -fasting for labs today  Angry that front desk told her she cant have labs. Also angry she is seeing susan for physical - wants to see me for physical.  ROS: See pertinent positives and negatives per HPI.  Past Medical History:  Diagnosis Date  . Actinic keratosis 12/03/2012  . Anxiety    Effexor helps--psychiatrist Dr Toy Care  . Arthritis    osteoarthritis, hands  . Atypical chest pain    EF 86%, breast attenuation, no significant ischemia  . Cervical radiculopathy 11/18/2013  . Chronic headache   . Esophagitis 01/07/2010   Qualifier: Diagnosis of  By: Surface RN, Butch Penny    . GERD (gastroesophageal reflux disease) 11/2009   EGD: Dr. Sharlett Iles: erosive stricture dilated  . ISCHEMIC COLITIS 06/05/2008   Qualifier: Diagnosis of  By: Nils Pyle CMA ([place]), Mearl Latin    . Migraines   . Mild chronic ulcerative colitis ([place])   . Osteoporosis 02/2012   t score -2.5 spine  . Palpitations   . Rotator cuff tendonitis 05/26/2011  . 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 AUGMENTATION MAMMAPLASTY    . Birth mark removed    . COLPOSCOPY    . laser vaporization of upper vagina  1998  . NM MYOCAR PERF WALL MOTION  01/24/2008   EF 86% neg ischemia  . VAGINAL  HYSTERECTOMY  1978    Family History  Problem Relation Age of Onset  . Heart disease Mother   . Heart failure Mother   . Breast cancer Mother 11  . Cancer Father     prostate  . Inflammatory bowel disease Sister   . Diabetes Maternal Aunt   . Cancer Paternal Grandfather     colon    Social History   Social History  . Marital status: Married    Spouse name: N/A  . Number of children: N/A  . Years of education: N/A   Social History Main Topics  . Smoking status: Former Smoker    Packs/day: 1.00    Years: 10.00    Types: Cigarettes    Quit date: 12/15/1988  . Smokeless tobacco: Never Used     Comment: quit in the 1980's  . Alcohol use 7.0 oz/week    14 Standard drinks or equivalent per week     Comment: 1 glass every few days  . Drug use: No  . Sexual activity: No     Comment: HYST   Other Topics Concern  . None   Social History Narrative   Married, mother of one.  Lives with spouse and 4 dogs.   Very active. Regular exercise  -- yes.  Quit smoking in 1990.     She monitors her diet well.     1-2 glasses of wine almost every night.  Current Outpatient Prescriptions:  .  azelastine (ASTELIN) 0.1 % nasal spray, USE TWO SPRAYS IN EACH NOSTRIL TWICE DAILY AS DIRECTED, Disp: 30 mL, Rfl: 5 .  beta carotene w/minerals (OCUVITE) tablet, Take 1 tablet by mouth daily., Disp: , Rfl:  .  Bilberry, Vaccinium myrtillus, (BILBERRY PO), Take 1 capsule by mouth daily., Disp: , Rfl:  .  docusate sodium (COLACE) 100 MG capsule, Take 1 capsule (100 mg total) by mouth every 12 (twelve) hours., Disp: 60 capsule, Rfl: 0 .  fluticasone (FLONASE) 50 MCG/ACT nasal spray, USE TWO SPRAY(S) IN EACH NOSTRIL ONCE DAILY, Disp: 16 g, Rfl: 5 .  magnesium citrate SOLN, Take 296 mLs (1 Bottle total) by mouth once., Disp: 195 mL, Rfl: 1 .  metoprolol tartrate (LOPRESSOR) 25 MG tablet, TAKE ONE TABLET BY MOUTH TWICE DAILY, Disp: 180 tablet, Rfl: 3 .  Multiple Vitamins-Minerals (ICAPS LUTEIN &  ZEAXANTHIN PO), Take by mouth., Disp: , Rfl:  .  pantoprazole (PROTONIX) 40 MG tablet, TAKE ONE TABLET BY MOUTH ONCE DAILY, Disp: 90 tablet, Rfl: 3 .  polyethylene glycol (MIRALAX / GLYCOLAX) packet, Take 17 g by mouth daily., Disp: 14 each, Rfl: 0 .  SUMAtriptan (IMITREX) 50 MG tablet, Take 1 tablet (50 mg total) by mouth as needed. For severe headache., Disp: 10 tablet, Rfl: 3 .  venlafaxine XR (EFFEXOR-XR) 75 MG 24 hr capsule, TAKE ONE CAPSULE BY MOUTH ONCE DAILY WITH  BREAKFAST., Disp: 90 capsule, Rfl: 1  EXAM:  Vitals:   08/15/16 0854  BP: (!) 142/74  Pulse: 83  Temp: 98.5 F (36.9 C)    Body mass index is 21.67 kg/m.  GENERAL: vitals reviewed and listed above, alert, oriented, appears well hydrated and in no acute distress  HEENT: atraumatic, conjunttiva clear, no obvious abnormalities on inspection of external nose and ears  NECK: no obvious masses on inspection  LUNGS: clear to auscultation bilaterally, no wheezes, rales or rhonchi, good air movement  CV: HRRR, no peripheral edema  ABD: BS+, soft, NTTP, No CVA TTP  MS: moves all extremities without noticeable abnormality; OA hands  PSYCH: pleasant and cooperative, no obvious depression or anxiety  ASSESSMENT AND PLAN:  Discussed the following assessment and plan:  Dysuria - Plan: POC Urinalysis Dipstick, Culture, Urine, Urinalysis with Reflex Microscopic -udip with leuks and tr blood, offered empiric tx vs waiting on culture  Polyarthralgia - Plan: Rheumatoid Factor, Cyclic citrul peptide antibody, IgG -labs per her request, OA on exam of hands  Hyperlipemia - Plan: Lipid Panel, Hemoglobin A1c -FASTING labs per her request  Gastroesophageal reflux disease, esophagitis presence not specified -ppi, GI follow up advised if persistent or worsening symptoms  Elevated blood pressure - Plan: Basic metabolic panel -she is angry as the front desk told her she could not do labs -will recheck at physical when more  calm  -Patient advised to return or notify a doctor immediately if symptoms worsen or persist or new concerns arise.  Patient Instructions  BEFORE YOU LEAVE: -follow up: CANCEL medicare exam with susan and schedule instead with Dr. Maudie Mercury per patient preference -udip - reflex if needed -labs  We have ordered labs or studies at this visit. It can take up to 1-2 weeks for results and processing. IF results require follow up or explanation, we will call you with instructions. Clinically stable results will be released to your [facility]. If you have not heard from [place] or cannot find your results in [facility] in 2 weeks please contact our office at  [phone].  If you are not yet signed up for [facility], please consider signing up.           Colin Benton R., DO

## 2016-08-15 NOTE — Patient Instructions (Signed)
BEFORE YOU LEAVE: -follow up: CANCEL medicare exam with susan and schedule instead with Dr. Maudie Mercury per patient preference -udip - reflex if needed -labs  We have ordered labs or studies at this visit. It can take up to 1-2 weeks for results and processing. IF results require follow up or explanation, we will call you with instructions. Clinically stable results will be released to your [facility]. If you have not heard from [place] or cannot find your results in [facility] in 2 weeks please contact our office at [phone].  If you are not yet signed up for [facility], please consider signing up.

## 2016-08-15 NOTE — Progress Notes (Signed)
Pre visit review using our clinic review tool, if applicable. No additional management support is needed unless otherwise documented below in the visit note. 

## 2016-08-16 LAB — RHEUMATOID FACTOR: Rhuematoid fact SerPl-aCnc: <10 IU/mL (ref <=14)

## 2016-08-17 LAB — URINE CULTURE

## 2016-08-19 LAB — CYCLIC CITRUL PEPTIDE ANTIBODY, IGG

## 2016-08-19 MED ORDER — NITROFURANTOIN MONOHYD MACRO 100 MG PO CAPS: 100.0000 mg | ORAL_CAPSULE | Freq: Two times a day (BID) | ORAL | 0 refills | Status: DC

## 2016-08-19 NOTE — Addendum Note (Signed)
Addended by: Agnes Lawrence on: 08/19/2016 08:11 AM   Modules accepted: Orders

## 2016-08-20 ENCOUNTER — Telehealth: Payer: Self-pay

## 2016-08-20 NOTE — Telephone Encounter (Signed)
Received PA request from [facility] for Macrobid 100mg  capsules. PA submitted & is pending. [id number]

## 2016-08-22 NOTE — Telephone Encounter (Signed)
PA approved through 12.31.2017. Form faxed to pharmacy and patient notified via mychart.

## 2016-09-08 ENCOUNTER — Encounter: Payer: Self-pay | Admitting: Family Medicine

## 2016-09-08 ENCOUNTER — Ambulatory Visit (INDEPENDENT_AMBULATORY_CARE_PROVIDER_SITE_OTHER): Payer: PPO | Admitting: Family Medicine

## 2016-09-08 VITALS — BP 100/50 | HR 67 | Temp 97.6°F | Ht 65.0 in | Wt 129.7 lb

## 2016-09-08 DIAGNOSIS — R300 Dysuria: Secondary | ICD-10-CM | POA: Diagnosis not present

## 2016-09-08 LAB — POCT URINALYSIS DIPSTICK
Bilirubin, UA: NEGATIVE
Glucose, UA: NEGATIVE
KETONES UA: NEGATIVE
Leukocytes, UA: NEGATIVE
Nitrite, UA: NEGATIVE
PH UA: 5
PROTEIN UA: NEGATIVE
UROBILINOGEN UA: 0.2

## 2016-09-08 LAB — URINALYSIS, MICROSCOPIC ONLY

## 2016-09-08 NOTE — Addendum Note (Signed)
Addended by: Agnes Lawrence on: 09/08/2016 02:49 PM   Modules accepted: Orders

## 2016-09-08 NOTE — Patient Instructions (Signed)
Follow up as scheduled.  We have ordered labs or studies at this visit. It can take up to 1-2 weeks for results and processing. IF results require follow up or explanation, we will call you with instructions. Clinically stable results will be released to your [facility]. If you have not heard from [place] or cannot find your results in [facility] in 2 weeks please contact our office at [phone].  If you are not yet signed up for [facility], please consider signing up.  Over the counter yeast treatment and vaginal moisturizers.

## 2016-09-08 NOTE — Progress Notes (Signed)
Pre visit review using our clinic review tool, if applicable. No additional management support is needed unless otherwise documented below in the visit note. 

## 2016-09-08 NOTE — Progress Notes (Signed)
HPI:  Acute visit for Dysuria: -started a few weeks ago, had minimal bacteria on culture treated with macrobid -reports symptoms resolved for a few days with abx then recurred  -mild burning when urinates -also has ingrown hair she wants me to look at on L labia -denies: vag discharge, fevers, malaise, NVD, abd or pelvic pain, rash, hematuria  ROS: See pertinent positives and negatives per HPI.  Past Medical History:  Diagnosis Date  . Actinic keratosis 12/03/2012  . Anxiety    Effexor helps--psychiatrist Dr Toy Care  . Arthritis    osteoarthritis, hands  . Atypical chest pain    EF 86%, breast attenuation, no significant ischemia  . Cervical radiculopathy 11/18/2013  . Chronic headache   . Esophagitis 01/07/2010   Qualifier: Diagnosis of  By: Surface RN, Butch Penny    . GERD (gastroesophageal reflux disease) 11/2009   EGD: Dr. Sharlett Iles: erosive stricture dilated  . ISCHEMIC COLITIS 06/05/2008   Qualifier: Diagnosis of  By: Nils Pyle CMA ([place]), Mearl Latin    . Migraines   . 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 Osteoporosis 02/2012   t score -2.5 spine  . Palpitations   . Rotator cuff tendonitis 05/26/2011  . 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 AUGMENTATION MAMMAPLASTY    . Birth mark removed    . COLPOSCOPY    . laser vaporization of upper vagina  1998  . NM MYOCAR PERF WALL MOTION  01/24/2008   EF 86% neg ischemia  . VAGINAL HYSTERECTOMY  1978    Family History  Problem Relation Age of Onset  . Heart disease Mother   . Heart failure Mother   . Breast cancer Mother 89  . Cancer Father     prostate  . Inflammatory bowel disease Sister   . Diabetes Maternal Aunt   . Cancer Paternal Grandfather     colon    Social History   Social History  . Marital status: Married    Spouse name: N/A  . Number of children: N/A  . Years of education: N/A   Social History Main Topics  . Smoking  status: Former Smoker    Packs/day: 1.00    Years: 10.00    Types: Cigarettes    Quit date: 12/15/1988  . Smokeless tobacco: Never Used     Comment: quit in the 1980's  . Alcohol use 7.0 oz/week    14 Standard drinks or equivalent per week     Comment: 1 glass every few days  . Drug use: No  . Sexual activity: No     Comment: HYST   Other Topics Concern  . None   Social History Narrative   Married, mother of one.  Lives with spouse and 4 dogs.   Very active. Regular exercise  -- yes.  Quit smoking in 1990.     She monitors her diet well.     1-2 glasses of wine almost every night.     Current Outpatient Prescriptions:  .  azelastine (ASTELIN) 0.1 % nasal spray, USE TWO SPRAYS IN EACH NOSTRIL TWICE DAILY AS DIRECTED, Disp: 30 mL, Rfl: 5 .  beta carotene w/minerals (OCUVITE) tablet, Take 1 tablet by mouth daily., Disp: , Rfl:  .  Bilberry, Vaccinium myrtillus, (BILBERRY PO), Take 1 capsule by mouth daily., Disp: , Rfl:  .  docusate sodium (COLACE) 100 MG capsule, Take 1 capsule (100 mg total) by mouth every 12 (  twelve) hours., Disp: 60 capsule, Rfl: 0 .  fluticasone (FLONASE) 50 MCG/ACT nasal spray, USE TWO SPRAY(S) IN EACH NOSTRIL ONCE DAILY, Disp: 16 g, Rfl: 5 .  magnesium citrate SOLN, Take 296 mLs (1 Bottle total) by mouth once., Disp: 195 mL, Rfl: 1 .  metoprolol tartrate (LOPRESSOR) 25 MG tablet, TAKE ONE TABLET BY MOUTH TWICE DAILY, Disp: 180 tablet, Rfl: 3 .  Multiple Vitamins-Minerals (ICAPS LUTEIN & ZEAXANTHIN PO), Take by mouth., Disp: , Rfl:  .  pantoprazole (PROTONIX) 40 MG tablet, TAKE ONE TABLET BY MOUTH ONCE DAILY, Disp: 90 tablet, Rfl: 3 .  polyethylene glycol (MIRALAX / GLYCOLAX) packet, Take 17 g by mouth daily., Disp: 14 each, Rfl: 0 .  SUMAtriptan (IMITREX) 50 MG tablet, Take 1 tablet (50 mg total) by mouth as needed. For severe headache., Disp: 10 tablet, Rfl: 3 .  venlafaxine XR (EFFEXOR-XR) 75 MG 24 hr capsule, TAKE ONE CAPSULE BY MOUTH ONCE DAILY WITH   BREAKFAST., Disp: 90 capsule, Rfl: 1  EXAM:  Vitals:   09/08/16 1351  BP: (!) 100/50  Pulse: 67  Temp: 97.6 F (36.4 C)    Body mass index is 21.58 kg/m.  GENERAL: vitals reviewed and listed above, alert, oriented, appears well hydrated and in no acute distress  HEENT: atraumatic, conjunttiva clear, no obvious abnormalities on inspection of external nose and ears  NECK: no obvious masses on inspection  LUNGS: clear to auscultation bilaterally, no wheezes, rales or rhonchi, good air movement  CV: HRRR, no peripheral edema  ABD: soft, NTTP, no CVA TTP  GU: normal inspection external genitalia, no skin lesion found and pt could not find either today, mild vaginal atrophy.  MS: moves all extremities without noticeable abnormality  PSYCH: pleasant and cooperative, no obvious depression or anxiety  ASSESSMENT AND PLAN:  Discussed the following assessment and plan:  Dysuria - Plan: POC Urinalysis Dipstick, Culture, Urine  -udip ok, will get micro and culture -trial 3 day top yeast treatment and vaginal moisturizer 2x per week -Patient advised to return or notify a doctor immediately if symptoms worsen or persist or new concerns arise.  Patient Instructions  Follow up as scheduled.  We have ordered labs or studies at this visit. It can take up to 1-2 weeks for results and processing. IF results require follow up or explanation, we will call you with instructions. Clinically stable results will be released to your [facility]. If you have not heard from [place] or cannot find your results in [facility] in 2 weeks please contact our office at [phone].  If you are not yet signed up for [facility], please consider signing up.  Over the counter yeast treatment and vaginal moisturizers.   Colin Benton R., DO

## 2016-09-09 LAB — URINE CULTURE: ORGANISM ID, BACTERIA: NO GROWTH

## 2016-09-26 ENCOUNTER — Ambulatory Visit: Payer: PPO

## 2016-10-06 ENCOUNTER — Ambulatory Visit (INDEPENDENT_AMBULATORY_CARE_PROVIDER_SITE_OTHER): Payer: PPO | Admitting: Family Medicine

## 2016-10-06 ENCOUNTER — Encounter: Payer: Self-pay | Admitting: Family Medicine

## 2016-10-06 VITALS — BP 102/60 | HR 62 | Ht 64.25 in | Wt 131.5 lb

## 2016-10-06 DIAGNOSIS — Z23 Encounter for immunization: Secondary | ICD-10-CM | POA: Diagnosis not present

## 2016-10-06 DIAGNOSIS — Z0000 Encounter for general adult medical examination without abnormal findings: Secondary | ICD-10-CM

## 2016-10-06 MED ORDER — VENLAFAXINE HCL ER 75 MG PO CP24: ORAL_CAPSULE | ORAL | 1 refills | Status: DC

## 2016-10-06 NOTE — Addendum Note (Signed)
Addended by: Agnes Lawrence on: 10/06/2016 02:46 PM   Modules accepted: Orders

## 2016-10-06 NOTE — Progress Notes (Signed)
Medicare Annual Preventive Care Visit  (initial annual wellness or annual wellness exam)  Concerns and/or follow up today:  Doing well without any new complaints. Takes a number of supplements and effexor. Reports mood ok but does have occ irritability and anxiety. No Depression or SI.   ROS: negative for report of fevers, unintentional weight loss, vision changes, vision loss, hearing loss or change, chest pain, sob, hemoptysis, melena, hematochezia, hematuria, genital discharge or lesions, falls, bleeding or bruising, loc, thoughts of suicide or self harm, memory loss  1.) Patient-completed health risk assessment  - completed and reviewed, see scanned documentation  2.) Review of Medical History: -PMH, PSH, Family History and current specialty and care providers reviewed and updated and listed below  - see scanned in document in chart and below  Past Medical History:  Diagnosis Date  . Actinic keratosis 12/03/2012  . Anxiety    Effexor helps--psychiatrist Dr Toy Care  . Arthritis    osteoarthritis, hands  . Atypical chest pain    EF 86%, breast attenuation, no significant ischemia  . Cervical radiculopathy 11/18/2013  . Chronic headache   . Esophagitis 01/07/2010   Qualifier: Diagnosis of  By: Surface RN, Butch Penny    . GERD (gastroesophageal reflux disease) 11/2009   EGD: Dr. Sharlett Iles: erosive stricture dilated  . ISCHEMIC COLITIS 06/05/2008   Qualifier: Diagnosis of  By: Nils Pyle CMA ([place]), Mearl Latin    . Migraines   . Mild chronic ulcerative colitis ([place])   . Osteoporosis 02/2012   t score -2.5 spine  . Palpitations   . Rotator cuff tendonitis 05/26/2011  . 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 AUGMENTATION MAMMAPLASTY    . Birth mark removed    . COLPOSCOPY    . laser vaporization of upper vagina  1998  . NM MYOCAR PERF WALL MOTION  01/24/2008   EF 86% neg ischemia  . VAGINAL HYSTERECTOMY  1978     Social History   Social History  . Marital status: Married    Spouse name: N/A  . Number of children: N/A  . Years of education: N/A   Occupational History  . Not on file.   Social History Main Topics  . Smoking status: Former Smoker    Packs/day: 1.00    Years: 10.00    Types: Cigarettes    Quit date: 12/15/1988  . Smokeless tobacco: Never Used     Comment: quit in the 1980's  . Alcohol use 7.0 oz/week    14 Standard drinks or equivalent per week     Comment: 1 glass every few days  . Drug use: No  . Sexual activity: No     Comment: HYST   Other Topics Concern  . Not on file   Social History Narrative   Married, mother of one.  Lives with spouse and 4 dogs.   Very active. Regular exercise  -- yes.  Quit smoking in 1990.     She monitors her diet well.     1-2 glasses of wine almost every night.    Family History  Problem Relation Age of Onset  . Heart disease Mother   . Heart failure Mother   . Breast cancer Mother 53  . Cancer Father     prostate  . Inflammatory bowel disease Sister   . Diabetes Maternal Aunt   . Cancer Paternal Grandfather     colon    Current Outpatient  Prescriptions on File Prior to Visit  Medication Sig Dispense Refill  . azelastine (ASTELIN) 0.1 % nasal spray USE TWO SPRAYS IN EACH NOSTRIL TWICE DAILY AS DIRECTED 30 mL 5  . beta carotene w/minerals (OCUVITE) tablet Take 1 tablet by mouth daily.    . Bilberry, Vaccinium myrtillus, (BILBERRY PO) Take 1 capsule by mouth daily.    Marland Kitchen docusate sodium (COLACE) 100 MG capsule Take 1 capsule (100 mg total) by mouth every 12 (twelve) hours. 60 capsule 0  . fluticasone (FLONASE) 50 MCG/ACT nasal spray USE TWO SPRAY(S) IN EACH NOSTRIL ONCE DAILY 16 g 5  . magnesium citrate SOLN Take 296 mLs (1 Bottle total) by mouth once. 195 mL 1  . metoprolol tartrate (LOPRESSOR) 25 MG tablet TAKE ONE TABLET BY MOUTH TWICE DAILY 180 tablet 3  . Multiple Vitamins-Minerals (ICAPS LUTEIN & ZEAXANTHIN PO) Take  by mouth.    . pantoprazole (PROTONIX) 40 MG tablet TAKE ONE TABLET BY MOUTH ONCE DAILY 90 tablet 3  . polyethylene glycol (MIRALAX / GLYCOLAX) packet Take 17 g by mouth daily. 14 each 0  . SUMAtriptan (IMITREX) 50 MG tablet Take 1 tablet (50 mg total) by mouth as needed. For severe headache. 10 tablet 3   No current facility-administered medications on file prior to visit.      3.) Review of functional ability and level of safety:  Any difficulty hearing?  NO  History of falling?  NO  Any trouble with IADLs - using a phone, using transportation, grocery shopping, preparing meals, doing housework, doing laundry, taking medications and managing money? NO  Advance Directives? NO - wants to discuss further with RN.  See summary of recommendations in Patient Instructions below.  4.) Physical Exam Vitals:   10/06/16 1430  BP: 102/60  Pulse: 62   Estimated body mass index is 22.4 kg/m as calculated from the following:   Height as of this encounter: 5' 4.25" (1.632 m).   Weight as of this encounter: 131 lb 8 oz (59.6 kg).  T 97.7  EKG (optional): deferred  General: alert, appear well hydrated and in no acute distress  HEENT: visual acuity grossly intact  CV: HRRR  Lungs: CTA bilaterally  Psych: pleasant and cooperative, no obvious depression or anxiety  Cognitive function grossly intact  See patient instructions for recommendations.  Education and counseling regarding the above review of health provided with a plan for the following: -see scanned patient completed form for further details -fall prevention strategies discussed  -healthy lifestyle discussed -importance and resources for completing advanced directives discussed -see patient instructions below for any other recommendations provided  4)The following written screening schedule of preventive measures were reviewed with assessment and plan made per below, orders and patient instructions:         Alcohol screening done - discussed limiting to no more then 2 in a day and no more then 7 drinks in 1 week     Obesity Screening and counseling done     STI screening (Hep C if born [birth year]) offered and per pt wishes     Tobacco Screening done        Pneumococcal (PPSV23 -one dose after 64, one before if risk factors), influenza yearly and hepatitis B vaccines (if high risk - end stage renal disease, IV drugs, homosexual men, live in home for mentally retarded, hemophilia receiving factors) ASSESSMENT/PLAN: offered today      Screening mammograph (yearly if >40) ASSESSMENT/PLAN: utd, reports does yearly  Screening Pap smear/pelvic exam (q2 years) ASSESSMENT/PLAN: does gyn exams with Dr. Marvel Plan      Colorectal cancer screening (FOBT yearly or flex sig q4y or colonoscopy q10y or barium enema q4y) ASSESSMENT/PLAN: utd, does with [place] [facility]      Diabetes outpatient self-management training services ASSESSMENT/PLAN: n/a      Bone mass measurements(covered q2y if indicated - estrogen def, osteoporosis, hyperparathyroid, vertebral abnormalities, osteoporosis or steroids) ASSESSMENT/PLAN: utd or discussed and ordered per pt wishes      Screening for glaucoma(q1y if high risk - diabetes, FH, AA and > 50 or hispanic and > 65) ASSESSMENT/PLAN: utd       Medical nutritional therapy for individuals with diabetes or renal disease ASSESSMENT/PLAN: see orders      Cardiovascular screening blood tests (lipids q5y) ASSESSMENT/PLAN: done      Diabetes screening tests ASSESSMENT/PLAN:  done   7.) Summary: -risk factors and conditions per above assessment were discussed and treatment, recommendations and referrals were offered per documentation above and orders and patient instructions.  Medicare annual wellness visit, subsequent  Patient Instructions  BEFORE YOU LEAVE: -advanced directive with Sharyn Lull -flu and pneumococcal vaccines if she wishes -follow up: 4-6 months  Consider  counseling for the anxiety and irritability.  We recommend the following healthy lifestyle for LIFE: 1) Small portions.   Tip: eat off of a salad plate instead of a dinner plate.  Tip: It is ok to feel hungry after a meal - that likely means you ate an appropriate portion.  Tip: if you need more or a snack choose fruits, veggies and/or a handful of nuts or seeds.  2) Eat a healthy clean diet.  * Tip: Avoid (less then 1 serving per week): processed foods, sweets, sweetened drinks, white starches (rice, flour, bread, potatoes, pasta, etc), red meat, fast foods, butter  *Tip: CHOOSE instead   * 5-9 servings per day of fresh or frozen fruits and vegetables (but not corn, potatoes, bananas, canned or dried fruit)   *nuts and seeds, beans   *olives and olive oil   *small portions of lean meats such as fish and white chicken    *small portions of whole grains  3)Get at least 150 minutes of sweaty aerobic exercise per week.  4)Reduce stress - consider counseling, meditation and relaxation to balance other aspects of your life.      Colin Benton R., DO

## 2016-10-06 NOTE — Progress Notes (Signed)
Spent 20 minutes discussing the following issues regarding Advanced Directives:  -Goals of care -Code status -Difference between hospice and palliative care -Living Will -Reason and significance for power of attorney -Difference between MOST form/DNR -What entails a full code status -Review of Advance Directive packet  Patient and Probation officer were present for conversation.  Patient was alert and oriented x4 with no impairment. No paperwork completed during conversation. Patient took home packet and is aware needs to come back completed, notarized.

## 2016-10-06 NOTE — Patient Instructions (Signed)
BEFORE YOU LEAVE: -advanced directive with [name] -flu and pneumococcal vaccines if she wishes -follow up: 4-6 months  Consider counseling for the anxiety and irritability.  We recommend the following healthy lifestyle for LIFE: 1) Small portions.   Tip: eat off of a salad plate instead of a dinner plate.  Tip: It is ok to feel hungry after a meal - that likely means you ate an appropriate portion.  Tip: if you need more or a snack choose fruits, veggies and/or a handful of nuts or seeds.  2) Eat a healthy clean diet.  * Tip: Avoid (less then 1 serving per week): processed foods, sweets, sweetened drinks, white starches (rice, flour, bread, potatoes, pasta, etc), red meat, fast foods, butter  *Tip: CHOOSE instead   * 5-9 servings per day of fresh or frozen fruits and vegetables (but not corn, potatoes, bananas, canned or dried fruit)   *nuts and seeds, beans   *olives and olive oil   *small portions of lean meats such as fish and white chicken    *small portions of whole grains  3)Get at least 150 minutes of sweaty aerobic exercise per week.  4)Reduce stress - consider counseling, meditation and relaxation to balance other aspects of your life.

## 2016-11-04 DIAGNOSIS — R509 Fever, unspecified: Secondary | ICD-10-CM | POA: Diagnosis not present

## 2016-11-04 DIAGNOSIS — B349 Viral infection, unspecified: Secondary | ICD-10-CM | POA: Diagnosis not present

## 2016-12-01 DIAGNOSIS — Z6822 Body mass index (BMI) 22.0-22.9, adult: Secondary | ICD-10-CM | POA: Diagnosis not present

## 2016-12-01 DIAGNOSIS — Z124 Encounter for screening for malignant neoplasm of cervix: Secondary | ICD-10-CM | POA: Diagnosis not present

## 2016-12-01 DIAGNOSIS — Z1389 Encounter for screening for other disorder: Secondary | ICD-10-CM | POA: Diagnosis not present

## 2016-12-01 DIAGNOSIS — Z01419 Encounter for gynecological examination (general) (routine) without abnormal findings: Secondary | ICD-10-CM | POA: Diagnosis not present

## 2016-12-01 LAB — HM PAP SMEAR: HM Pap smear: NEGATIVE

## 2016-12-12 ENCOUNTER — Encounter: Payer: Self-pay | Admitting: Family Medicine

## 2016-12-22 ENCOUNTER — Encounter: Payer: Self-pay | Admitting: Family Medicine

## 2016-12-22 ENCOUNTER — Ambulatory Visit (INDEPENDENT_AMBULATORY_CARE_PROVIDER_SITE_OTHER): Payer: PPO | Admitting: Family Medicine

## 2016-12-22 VITALS — BP 122/68 | HR 70 | Temp 98.1°F | Ht 64.25 in | Wt 130.9 lb

## 2016-12-22 DIAGNOSIS — W19XXXA Unspecified fall, initial encounter: Secondary | ICD-10-CM | POA: Diagnosis not present

## 2016-12-22 DIAGNOSIS — M79605 Pain in left leg: Secondary | ICD-10-CM | POA: Diagnosis not present

## 2016-12-22 DIAGNOSIS — M25552 Pain in left hip: Secondary | ICD-10-CM

## 2016-12-22 NOTE — Progress Notes (Signed)
Pre visit review using our clinic review tool, if applicable. No additional management support is needed unless otherwise documented below in the visit note. 

## 2016-12-22 NOTE — Progress Notes (Signed)
HPI:  [name] is a pleasant 76 year old with a past medical history significant for anxiety here for an acute visit left leg pain. She was walking several big dogs about 10 days ago one of the dogs tripped her and she fell on her bottom. She felt okay immediately after the fall, then she did develop over the next several days some pain in the left knee, left hip and diffusely throughout the left medial thigh. The knee pain has resolved. She now mainly has pain in the left lower medial thigh muscles. This pain can be severe with certain movements. No swelling, redness, weakness, numbness, bowel or bladder dysfunction. She is having finger surgery reports she was told she could not take any NSAIDs or Tylenol prior to her surgery tomorrow.   ROS: See pertinent positives and negatives per HPI.  Past Medical History:  Diagnosis Date  . Actinic keratosis 12/03/2012  . Anxiety    Effexor helps--psychiatrist Dr Toy Care  . Arthritis    osteoarthritis, hands  . Atypical chest pain    EF 86%, breast attenuation, no significant ischemia  . Cervical radiculopathy 11/18/2013  . Chronic headache   . Esophagitis 01/07/2010   Qualifier: Diagnosis of  By: Surface RN, Butch Penny    . GERD (gastroesophageal reflux disease) 11/2009   EGD: Dr. Sharlett Iles: erosive stricture dilated  . ISCHEMIC COLITIS 06/05/2008   Qualifier: Diagnosis of  By: Nils Pyle CMA ([place]), Mearl Latin    . Migraines   . Mild chronic ulcerative colitis ([place])   . Osteoporosis 02/2012   t score -2.5 spine  . Palpitations   . Rotator cuff tendonitis 05/26/2011  . 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 AUGMENTATION MAMMAPLASTY    . Birth mark removed    . COLPOSCOPY    . laser vaporization of upper vagina  1998  . NM MYOCAR PERF WALL MOTION  01/24/2008   EF 86% neg ischemia  . VAGINAL HYSTERECTOMY  1978    Family History  Problem Relation Age of Onset  .  Heart disease Mother   . Heart failure Mother   . Breast cancer Mother 60  . Cancer Father     prostate  . Inflammatory bowel disease Sister   . Diabetes Maternal Aunt   . Cancer Paternal Grandfather     colon    Social History   Social History  . Marital status: Married    Spouse name: N/A  . Number of children: N/A  . Years of education: N/A   Social History Main Topics  . Smoking status: Former Smoker    Packs/day: 1.00    Years: 10.00    Types: Cigarettes    Quit date: 12/15/1988  . Smokeless tobacco: Never Used     Comment: quit in the 1980's  . Alcohol use 7.0 oz/week    14 Standard drinks or equivalent per week     Comment: 1 glass every few days  . Drug use: No  . Sexual activity: No     Comment: HYST   Other Topics Concern  . None   Social History Narrative   Married, mother of one.  Lives with spouse and 4 dogs.   Very active. Regular exercise  -- yes.  Quit smoking in 1990.     She monitors her diet well.     1-2 glasses of wine almost every night.     Current Outpatient Prescriptions:  .  azelastine (ASTELIN) 0.1 % nasal spray, USE TWO SPRAYS IN EACH NOSTRIL TWICE DAILY AS DIRECTED, Disp: 30 mL, Rfl: 5 .  beta carotene w/minerals (OCUVITE) tablet, Take 1 tablet by mouth daily., Disp: , Rfl:  .  Bilberry, Vaccinium myrtillus, (BILBERRY PO), Take 1 capsule by mouth daily., Disp: , Rfl:  .  docusate sodium (COLACE) 100 MG capsule, Take 1 capsule (100 mg total) by mouth every 12 (twelve) hours., Disp: 60 capsule, Rfl: 0 .  fluticasone (FLONASE) 50 MCG/ACT nasal spray, USE TWO SPRAY(S) IN EACH NOSTRIL ONCE DAILY, Disp: 16 g, Rfl: 5 .  magnesium citrate SOLN, Take 296 mLs (1 Bottle total) by mouth once., Disp: 195 mL, Rfl: 1 .  metoprolol tartrate (LOPRESSOR) 25 MG tablet, TAKE ONE TABLET BY MOUTH TWICE DAILY, Disp: 180 tablet, Rfl: 3 .  Multiple Vitamins-Minerals (ICAPS LUTEIN & ZEAXANTHIN PO), Take by mouth., Disp: , Rfl:  .  pantoprazole (PROTONIX) 40 MG  tablet, TAKE ONE TABLET BY MOUTH ONCE DAILY, Disp: 90 tablet, Rfl: 3 .  polyethylene glycol (MIRALAX / GLYCOLAX) packet, Take 17 g by mouth daily., Disp: 14 each, Rfl: 0 .  SUMAtriptan (IMITREX) 50 MG tablet, Take 1 tablet (50 mg total) by mouth as needed. For severe headache., Disp: 10 tablet, Rfl: 3 .  venlafaxine XR (EFFEXOR-XR) 75 MG 24 hr capsule, TAKE ONE CAPSULE BY MOUTH ONCE DAILY WITH  BREAKFAST., Disp: 90 capsule, Rfl: 1  EXAM:  Vitals:   12/22/16 1011  BP: 122/68  Pulse: 70  Temp: 98.1 F (36.7 C)    Body mass index is 22.29 kg/m.  GENERAL: vitals reviewed and listed above, alert, oriented, appears well hydrated and in no acute distress  HEENT: atraumatic, conjunttiva clear, no obvious abnormalities on inspection of external nose and ears  NECK: no obvious masses on inspection  LUNGS: clear to auscultation bilaterally, no wheezes, rales or rhonchi, good air movement  CV: HRRR, no peripheral edema  MS: moves all extremities without noticeable abnormality; normal inspection of the low back, buttocks, hips and legs without any signs of trauma, bruising or hematomas. On inspection of the left lower extremity, Tenderness to palpation in the abductor muscles of the medial lower thigh; no tenderness to palpation around the knee, negative valgus varus stress on the knee, negative Lockman on the knee, negative McMurray in the knee, negative anterior/posterior drawer, normal range of motion in external and internal rotation of the hip, no tenderness to palpation over the pelvis or the lateral hip, significant tenderness to palpation over the left hip socket. Neurovascularly intact distally.  PSYCH: pleasant and cooperative, no obvious depression or anxiety  ASSESSMENT AND PLAN:  Discussed the following assessment and plan:  Left hip pain - Plan: DG HIP UNILAT WITH PELVIS MIN 4 VIEWS LEFT  Left leg pain  Fall, initial encounter - Plan: DG HIP UNILAT WITH PELVIS MIN 4 VIEWS  LEFT  -Spectrum muscle strain for the pain in the medial upper leg -We'll get plain films of the hip given the pain over the left hip socket -Options for dramatic care discussed -Return precautions And fall precautions discussed -Patient advised to return or notify a doctor immediately if symptoms worsen or persist or new concerns arise.  Patient Instructions  BEFORE YOU LEAVE: -follow up: 3-4 weeks -xray sheet  Get the xray of the hip.  Can use topical sports creams and ask your surgeon if ok to use tylenol as needed per instructions for pain  Heat for the muscle soreness can  be helpful.  Follow up sooner if worsening or new concerns.    Colin Benton R., DO

## 2016-12-22 NOTE — Patient Instructions (Signed)
BEFORE YOU LEAVE: -follow up: 3-4 weeks -xray sheet  Get the xray of the hip.  Can use topical sports creams and ask your surgeon if ok to use tylenol as needed per instructions for pain  Heat for the muscle soreness can be helpful.  Follow up sooner if worsening or new concerns.

## 2016-12-23 DIAGNOSIS — M25552 Pain in left hip: Secondary | ICD-10-CM | POA: Diagnosis not present

## 2017-01-05 ENCOUNTER — Ambulatory Visit
Admission: RE | Admit: 2017-01-05 | Discharge: 2017-01-05 | Disposition: A | Discharge status: Home / Self Care | Payer: PPO | Source: Ambulatory Visit | Attending: Family Medicine | Admitting: Family Medicine

## 2017-01-05 DIAGNOSIS — W19XXXA Unspecified fall, initial encounter: Secondary | ICD-10-CM

## 2017-01-05 DIAGNOSIS — H524 Presbyopia: Secondary | ICD-10-CM | POA: Diagnosis not present

## 2017-01-05 DIAGNOSIS — H353132 Nonexudative age-related macular degeneration, bilateral, intermediate dry stage: Secondary | ICD-10-CM | POA: Diagnosis not present

## 2017-01-05 DIAGNOSIS — H43813 Vitreous degeneration, bilateral: Secondary | ICD-10-CM | POA: Diagnosis not present

## 2017-01-05 DIAGNOSIS — H2513 Age-related nuclear cataract, bilateral: Secondary | ICD-10-CM | POA: Diagnosis not present

## 2017-01-05 DIAGNOSIS — M25552 Pain in left hip: Secondary | ICD-10-CM

## 2017-01-07 DIAGNOSIS — M25552 Pain in left hip: Secondary | ICD-10-CM | POA: Diagnosis not present

## 2017-01-07 DIAGNOSIS — M65332 Trigger finger, left middle finger: Secondary | ICD-10-CM | POA: Diagnosis not present

## 2017-01-14 DIAGNOSIS — M65332 Trigger finger, left middle finger: Secondary | ICD-10-CM | POA: Diagnosis not present

## 2017-01-19 ENCOUNTER — Ambulatory Visit: Payer: PPO | Admitting: Family Medicine

## 2017-01-27 DIAGNOSIS — M65332 Trigger finger, left middle finger: Secondary | ICD-10-CM | POA: Diagnosis not present

## 2017-01-30 DIAGNOSIS — M65332 Trigger finger, left middle finger: Secondary | ICD-10-CM | POA: Diagnosis not present

## 2017-02-02 DIAGNOSIS — M65332 Trigger finger, left middle finger: Secondary | ICD-10-CM | POA: Diagnosis not present

## 2017-02-09 DIAGNOSIS — M25511 Pain in right shoulder: Secondary | ICD-10-CM | POA: Diagnosis not present

## 2017-02-11 DIAGNOSIS — M65332 Trigger finger, left middle finger: Secondary | ICD-10-CM | POA: Diagnosis not present

## 2017-03-10 DIAGNOSIS — L308 Other specified dermatitis: Secondary | ICD-10-CM | POA: Diagnosis not present

## 2017-03-10 DIAGNOSIS — L821 Other seborrheic keratosis: Secondary | ICD-10-CM | POA: Diagnosis not present

## 2017-03-25 DIAGNOSIS — H2513 Age-related nuclear cataract, bilateral: Secondary | ICD-10-CM | POA: Diagnosis not present

## 2017-03-25 DIAGNOSIS — H353131 Nonexudative age-related macular degeneration, bilateral, early dry stage: Secondary | ICD-10-CM | POA: Diagnosis not present

## 2017-04-20 DIAGNOSIS — H2512 Age-related nuclear cataract, left eye: Secondary | ICD-10-CM | POA: Diagnosis not present

## 2017-05-25 ENCOUNTER — Encounter: Payer: Self-pay | Admitting: Family Medicine

## 2017-05-25 ENCOUNTER — Telehealth: Payer: Self-pay | Admitting: Gastroenterology

## 2017-05-25 ENCOUNTER — Ambulatory Visit (INDEPENDENT_AMBULATORY_CARE_PROVIDER_SITE_OTHER): Payer: PPO | Admitting: Family Medicine

## 2017-05-25 VITALS — BP 118/70 | HR 98 | Temp 97.6°F | Wt 131.2 lb

## 2017-05-25 DIAGNOSIS — K921 Melena: Secondary | ICD-10-CM | POA: Diagnosis not present

## 2017-05-25 DIAGNOSIS — R197 Diarrhea, unspecified: Secondary | ICD-10-CM | POA: Diagnosis not present

## 2017-05-25 LAB — CBC WITH DIFFERENTIAL/PLATELET
BASOS ABS: 0 10*3/uL (ref 0.0–0.1)
Basophils Relative: 0.2 % (ref 0.0–3.0)
EOS ABS: 0.1 10*3/uL (ref 0.0–0.7)
EOS PCT: 1 % (ref 0.0–5.0)
HCT: 39.5 % (ref 36.0–46.0)
HEMOGLOBIN: 13.5 g/dL (ref 12.0–15.0)
Lymphocytes Relative: 20.3 % (ref 12.0–46.0)
Lymphs Abs: 1.1 10*3/uL (ref 0.7–4.0)
MCHC: 34.1 g/dL (ref 30.0–36.0)
MCV: 94.5 fl (ref 78.0–100.0)
MONO ABS: 0.5 10*3/uL (ref 0.1–1.0)
Monocytes Relative: 8.7 % (ref 3.0–12.0)
Neutro Abs: 3.7 10*3/uL (ref 1.4–7.7)
Neutrophils Relative %: 69.8 % (ref 43.0–77.0)
Platelets: 181 10*3/uL (ref 150.0–400.0)
RBC: 4.18 Mil/uL (ref 3.87–5.11)
RDW: 13 % (ref 11.5–15.5)
WBC: 5.3 10*3/uL (ref 4.0–10.5)

## 2017-05-25 MED ORDER — HYOSCYAMINE SULFATE 0.125 MG PO TABS: 0.1250 mg | ORAL_TABLET | ORAL | 0 refills | Status: DC | PRN

## 2017-05-25 NOTE — Patient Instructions (Addendum)
We have ordered labs or studies at this visit. It can take up to 1-2 weeks for results and processing. IF results require follow up or explanation, we will call you with instructions. Clinically stable results will be released to your [facility]. If you have not heard from [place] or cannot find your results in [facility] in 2 weeks please contact our office at [phone].  If you are not yet signed up for [facility], please consider signing up   It is important to drink water to remain hydrated; an urgent referral has been placed to the GI provider and blood work will be obtained now to evaluate further treatment. If you are having increased blood; please seek immediate medical evaluation.   Rectal Bleeding Rectal bleeding is when blood comes out of the opening of the butt (anus). People with this kind of bleeding may notice bright red blood in their underwear or in the toilet after they poop (have a bowel movement). They may also have dark red or black poop (stool). Rectal bleeding is often a sign that something is wrong. It needs to be checked by a doctor. Follow these instructions at home: Watch for any changes in your condition. Take these actions to help with bleeding and discomfort:  Eat a diet that is high in fiber. This will keep your poop soft so it is easier for you to poop without pushing too hard. Ask your doctor to tell you what foods and drinks are high in fiber.  Drink enough fluid to keep your pee (urine) clear or pale yellow. This also helps keep your poop soft.  Try taking a warm bath. This may help with pain.  Keep all follow-up visits as told by your doctor. This is important.  Get help right away if:  You have new bleeding.  You have more bleeding than before.  You have black or dark red poop.  You throw up (vomit) blood or something that looks like coffee grounds.  You have pain or tenderness in your belly (abdomen).  You have a fever.  You feel weak.  You feel sick to  your stomach (nauseous).  You pass out (faint).  You have very bad pain in your butt.  You cannot poop. This information is not intended to replace advice given to you by your health care provider. Make sure you discuss any questions you have with your health care provider. Document Released: 08/13/2011 Document Revised: 05/08/2016 Document Reviewed: 01/27/2016 Elsevier Interactive Patient Education  2018 [employer] NOW OFFER   [facility] FAST TRACK!!!  SAME DAY Appointments for ACUTE CARE  Such as: Sprains, Injuries, cuts, abrasions, rashes, muscle pain, joint pain, back pain Colds, flu, sore throats, headache, allergies, cough, fever  Ear pain, sinus and eye infections Abdominal pain, nausea, vomiting, diarrhea, upset stomach Animal/insect bites  3 Easy Ways to Schedule: Walk-In Scheduling Call in scheduling Mychart Sign-up: [email or web address]

## 2017-05-25 NOTE — Telephone Encounter (Signed)
Notified Land at [employer] of this.

## 2017-05-25 NOTE — Progress Notes (Addendum)
Subjective:    Patient ID: [name], female    DOB: Apr 14, 1941, 76 y.o.   MRN: [id number]  HPI  [name] is a 76 year old female who presents today with loose stools that continued throughout the day yesterday.  Associated bleeding with stools is reported and she states this is with every stool She reports loose stools two days prior to this episode that resolved and then she ate some spicy [place] food that may have triggered this episode.   History of ulcerative colitis where she has been evaluated by GI.  She reports that she has not had a flare like this in years. Typically she experiences constipation and rarely if ever experiences diarrhea.   Treatment with hyoscyamine and robinul forte that were provided by GI quite some time ago and medication has expired has provided limited benefit.  Diarrhea: Patient complains of diarrhea.  Onset of diarrhea was one day ago Diarrhea is occurring approximately "constantly" noted as >10 episodes/day  Patient describes diarrhea as loose stools, blood is now present with episodes Diarrhea has been associated with abdominal cramping, reports nausea that occurred without vomiting. Patient denies fever, chills, sweat, N/V currently, pain with defection, night sweats   Recent trigger:  One possible spicy meal with [place] food however loose stools were noted prior to eating. She reports that she is eating ice chips solely and is avoiding food as she does not want to have another episode of diarrhea..  Last colonoscopy in 2009 had pathology results that are listed below:  1. TERMINAL ILEUM, BIOPSY:  - BENIGN TERMINAL ILEUM.  - NO ACTIVE ILEITIS, GRANULOMAS OR DYSPLASIA IDENTIFIED.   2. COLON, RIGHT, RANDOM BIOPSIES:  - FOCAL CHRONIC ACTIVE MUCOSAL COLITIS.  - NO GRANULOMAS OR DYSPLASIA IDENTIFIED.   3. COLON, LEFT, RANDOM BIOPSIES:  - BENIGN COLONIC MUCOSA WITH FOCAL REACTIVE LYMPHOID  AGGREGATES.  - NO ACTIVE MUCOSAL INFLAMMATION,  GRANULOMAS OR DYSPLASIA  IDENTIFIED.  She cannot quantify amount of blood but states this is now present with each BM.  Review of Systems  Constitutional: Positive for fatigue. Negative for chills and fever.  Respiratory: Negative for cough, shortness of breath and wheezing.   Cardiovascular: Negative for chest pain and palpitations.  Gastrointestinal: Positive for abdominal pain, blood in stool and diarrhea. Negative for constipation, nausea and vomiting.  Skin: Negative for rash.  Neurological: Negative for dizziness, weakness, light-headedness and headaches.   Past Medical History:  Diagnosis Date  . Actinic keratosis 12/03/2012  . Anxiety    Effexor helps--psychiatrist Dr Toy Care  . Arthritis    osteoarthritis, hands  . Atypical chest pain    EF 86%, breast attenuation, no significant ischemia  . Cervical radiculopathy 11/18/2013  . Chronic headache   . Esophagitis 01/07/2010   Qualifier: Diagnosis of  By: Surface RN, Butch Penny    . GERD (gastroesophageal reflux disease) 11/2009   EGD: Dr. Sharlett Iles: erosive stricture dilated  . ISCHEMIC COLITIS 06/05/2008   Qualifier: Diagnosis of  By: Nils Pyle CMA ([place]), Mearl Latin    . Migraines   . Mild chronic ulcerative colitis ([place])   . Osteoporosis 02/2012   t score -2.5 spine  . Palpitations   . Rotator cuff tendonitis 05/26/2011  . 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 Marital status: Married    Spouse name: N/A  . Number of children: N/A  . Years of education: N/A   Occupational History  .  Not on file.   Social History Main Topics  . Smoking status: Former Smoker    Packs/day: 1.00    Years: 10.00    Types: Cigarettes    Quit date: 12/15/1988  . Smokeless tobacco: Never Used     Comment: quit in the 1980's  . Alcohol use 7.0 oz/week    14 Standard drinks or equivalent per week     Comment: 1 glass every few days  . Drug use: No  . Sexual activity: No      Comment: HYST   Other Topics Concern  . Not on file   Social History Narrative   Married, mother of one.  Lives with spouse and 4 dogs.   Very active. Regular exercise  -- yes.  Quit smoking in 1990.     She monitors her diet well.     1-2 glasses of wine almost every night.    Past Surgical History:  Procedure Laterality Date  . AUGMENTATION MAMMAPLASTY    . Birth mark removed    . COLPOSCOPY    . laser vaporization of upper vagina  1998  . NM MYOCAR PERF WALL MOTION  01/24/2008   EF 86% neg ischemia  . VAGINAL HYSTERECTOMY  1978    Family History  Problem Relation Age of Onset  . Heart disease Mother   . Heart failure Mother   . Breast cancer Mother 73  . Cancer Father        prostate  . Inflammatory bowel disease Sister   . Diabetes Maternal Aunt   . Cancer Paternal Grandfather        colon    Allergies  Allergen Reactions  . Levaquin [Levofloxacin In D5w] Nausea Only    Dizziness and tremulousness    Current Outpatient Prescriptions on File Prior to Visit  Medication Sig Dispense Refill  . azelastine (ASTELIN) 0.1 % nasal spray USE TWO SPRAYS IN EACH NOSTRIL TWICE DAILY AS DIRECTED 30 mL 5  . beta carotene w/minerals (OCUVITE) tablet Take 1 tablet by mouth daily.    . Bilberry, Vaccinium myrtillus, (BILBERRY PO) Take 1 capsule by mouth daily.    Marland Kitchen docusate sodium (COLACE) 100 MG capsule Take 1 capsule (100 mg total) by mouth every 12 (twelve) hours. 60 capsule 0  . fluticasone (FLONASE) 50 MCG/ACT nasal spray USE TWO SPRAY(S) IN EACH NOSTRIL ONCE DAILY 16 g 5  . magnesium citrate SOLN Take 296 mLs (1 Bottle total) by mouth once. 195 mL 1  . metoprolol tartrate (LOPRESSOR) 25 MG tablet TAKE ONE TABLET BY MOUTH TWICE DAILY 180 tablet 3  . Multiple Vitamins-Minerals (ICAPS LUTEIN & ZEAXANTHIN PO) Take by mouth.    . pantoprazole (PROTONIX) 40 MG tablet TAKE ONE TABLET BY MOUTH ONCE DAILY 90 tablet 3  . polyethylene glycol (MIRALAX / GLYCOLAX) packet Take 17 g by  mouth daily. 14 each 0  . SUMAtriptan (IMITREX) 50 MG tablet Take 1 tablet (50 mg total) by mouth as needed. For severe headache. 10 tablet 3  . venlafaxine XR (EFFEXOR-XR) 75 MG 24 hr capsule TAKE ONE CAPSULE BY MOUTH ONCE DAILY WITH  BREAKFAST. 90 capsule 1   No current facility-administered medications on file prior to visit.     BP 118/70 (BP Location: Left Arm, Patient Position: Sitting, Cuff Size: Normal)   Pulse 98   Temp 97.6 F (36.4 C) (Oral)   Wt 131 lb 3.2 oz (59.5 kg)   SpO2 97%   BMI 22.35 kg/m  Objective:   Physical Exam  Constitutional: She is oriented to person, place, and time.  Thin, optimally nourished female who appears ill  HENT:  Mouth/Throat: Oropharynx is clear and moist and mucous membranes are normal.  Eyes: Pupils are equal, round, and reactive to light. No scleral icterus.  Neck: Neck supple.  Cardiovascular: Normal rate and regular rhythm.   Pulmonary/Chest: Effort normal and breath sounds normal. She has no wheezes. She has no rales.  Abdominal: Soft. Bowel sounds are normal. She exhibits no distension. There is no rebound, no CVA tenderness, no tenderness at McBurney's point and negative Murphy's sign.  Mild tenderness to palpation across abdomen that is generalized   Genitourinary:  Genitourinary Comments: No obvious source of bleeding noted with external inspection and DRE. No fissure, excoriation, or external hemorrhoids present.   Lymphadenopathy:    She has no cervical adenopathy.  Neurological: She is alert and oriented to person, place, and time.  Skin: Skin is warm and dry. No rash noted.  Psychiatric: She has a normal mood and affect. Her behavior is normal. Judgment and thought content normal.      Assessment & Plan:  1. Diarrhea, unspecified type Low suspicion for infectious process or  Uncooked food source; spicy food source may be a trigger for colitis and urgent GI referral with initiation of triage at GI clinic has been  initiated due to report of blood with each stool  - hyoscyamine (LEVSIN, ANASPAZ) 0.125 MG tablet; Take 1 tablet (0.125 mg total) by mouth every 4 (four) hours as needed.  Dispense: 30 tablet; Refill: 0  2. Blood in stool, frank Unable to accurately quantify amount of blood per patient report; will check CBC, placed urgent GI referral; and initiated triage through GI provider Dr. Ardis Hughs to see if patient can be seen today as she is followed by this clinic. - CBC with Differential/Platelet - Ambulatory referral to Gastroenterology  Advised patient to focus on hydration, we discussed the importance of seeking immediate medical evaluation through the ED if symptoms are worsening and blood is increasing, N/V, unable to hold down liquids, or fever. Discussed close follow up with strict return precautions. CBC will be obtained to evaluate for anemia. She verbalized understanding stating she has been evaluated for this in her history at the ED and is aware that this is needed if symptoms worsen.   Delano Metz, FNP-C

## 2017-05-25 NOTE — Telephone Encounter (Signed)
[name] doesn't have any appt's before next Tuesday.  Will you let them know.  I see they drew labs today, have them call after the labs are back and if the levels are very abnormal I can send to Dr Ardis Hughs for review.

## 2017-05-25 NOTE — Telephone Encounter (Signed)
Please see note from [facility] about the status of Stat appointment

## 2017-05-29 ENCOUNTER — Telehealth: Payer: Self-pay | Admitting: Gastroenterology

## 2017-05-29 NOTE — Telephone Encounter (Signed)
The pt will keep her appt on Tuesday 06/02/17 to discuss rectal bleeding.

## 2017-06-02 ENCOUNTER — Encounter: Payer: Self-pay | Admitting: Nurse Practitioner

## 2017-06-02 ENCOUNTER — Ambulatory Visit (INDEPENDENT_AMBULATORY_CARE_PROVIDER_SITE_OTHER): Payer: PPO | Admitting: Nurse Practitioner

## 2017-06-02 ENCOUNTER — Other Ambulatory Visit (INDEPENDENT_AMBULATORY_CARE_PROVIDER_SITE_OTHER): Payer: PPO

## 2017-06-02 VITALS — BP 124/58 | HR 60 | Ht 65.0 in | Wt 134.0 lb

## 2017-06-02 DIAGNOSIS — K5909 Other constipation: Secondary | ICD-10-CM

## 2017-06-02 DIAGNOSIS — K625 Hemorrhage of anus and rectum: Secondary | ICD-10-CM

## 2017-06-02 DIAGNOSIS — R197 Diarrhea, unspecified: Secondary | ICD-10-CM | POA: Diagnosis not present

## 2017-06-02 LAB — CBC
HEMATOCRIT: 40 % (ref 36.0–46.0)
HEMOGLOBIN: 13.6 g/dL (ref 12.0–15.0)
MCHC: 34.1 g/dL (ref 30.0–36.0)
MCV: 93.4 fl (ref 78.0–100.0)
Platelets: 219 10*3/uL (ref 150.0–400.0)
RBC: 4.28 Mil/uL (ref 3.87–5.11)
RDW: 12.2 % (ref 11.5–15.5)
WBC: 5.1 10*3/uL (ref 4.0–10.5)

## 2017-06-02 MED ORDER — NA SULFATE-K SULFATE-MG SULF 17.5-3.13-1.6 GM/177ML PO SOLN: 1.0000 | Freq: Once | ORAL | 0 refills | Status: AC

## 2017-06-02 MED ORDER — HYOSCYAMINE SULFATE 0.125 MG PO TABS: 0.1250 mg | ORAL_TABLET | Freq: Three times a day (TID) | ORAL | 0 refills | Status: DC

## 2017-06-02 NOTE — Progress Notes (Signed)
HPI: Patient is a 76 year old female who was followed years ago by Dr. Sharlett Iles but established care with Dr. Ardis Hughs in 2017 when she was evaluated for dysphagia. 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 Random right colon biopsies in 2009 showed focal chronic active colitis. She cannot recall when or why Mesalamine was discontinued years ago. She used to suffer with loose stools but for several years has needed daily MiraLAX chronic constipation. On Saturday she developed lower abdominal discomfort, small caliber stools with blood. She had several episodes of bleeding on Saturday. After taking MiraLAX she passed several small balls of stool and felt much better. No further further bleeding or abdominal pain but since Saturday every time she urinates she has a small caliber stool BM.    Colonoscopy 2009: Mild segmental colitis of sigmoid. Left-sided random biopsies were unremarkable. Random biopsies of the right colon however showed focal chronic active colitis. Terminal ileal biopsies normal   COLON, RIGHT, RANDOM BIOPSIES: - FOCAL CHRONIC ACTIVE MUCOSAL COLITIS. - NO GRANULOMAS OR DYSPLASIA IDENTIFIED.   COLON, LEFT, RANDOM BIOPSIES: - BENIGN COLONIC MUCOSA WITH FOCAL REACTIVE LYMPHOID AGGREGATES.  - NO ACTIVE MUCOSAL INFLAMMATION, GRANULOMAS OR DYSPLASIA IDENTIFIED.  COMMENT 2. 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 The adjacent lamina propria shows an increase in lymphocytes and plasma cells including a few intraepithelial lymphocytes. No granulomas are seen and no dysplasia is identified. Although these changes may be seen in  so-called lymphocytic (microscopic colitis), I believe the prime differential diagnostic consideration is Crohn's disease. Clinical and endoscopic correlation would be helpful.  3. There is patchy increase  in inflammatory cells in the lamina propria consisting primarily of lymphocytes and plasma cells with no increase in intraepithelial lymphocytes. No active mucosal inflammation is identified in the left colon biopsies. No dysplasia is seen. (EA:mw, 06/23/08)  Past Medical History:  Diagnosis Date  . Actinic keratosis 12/03/2012  . Anxiety    Effexor helps--psychiatrist Dr Toy Care  . Arthritis    osteoarthritis, hands  . Atypical chest pain    EF 86%, breast attenuation, no significant ischemia  . Cervical radiculopathy 11/18/2013  . Chronic headache   . Esophagitis 01/07/2010   Qualifier: Diagnosis of  By: Surface RN, Butch Penny    . GERD (gastroesophageal reflux disease) 11/2009   EGD: Dr. Sharlett Iles: erosive stricture dilated  . ISCHEMIC COLITIS 06/05/2008   Qualifier: Diagnosis of  By: Nils Pyle CMA ([place]), Mearl Latin    . Migraines   . Mild chronic ulcerative colitis ([place])   . Osteoporosis 02/2012   t score -2.5 spine  . Palpitations   . Rotator cuff tendonitis 05/26/2011  . 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    Patient's surgical history, family medical history, social history, medications and allergies were all reviewed in Epic    Physical Exam: BP (!) 124/58   Pulse 60   Ht 5\' 5"  (1.651 m)   Wt 134 lb (60.8 kg)   BMI 22.30 kg/m   GENERAL: well developed white female in NAD PSYCH: :Pleasant, cooperative, normal affect EENT:  conjunctiva pink, mucous membranes moist, neck supple without masses CARDIAC:  RRR, no  murmur heard, no peripheral edema PULM: Normal respiratory effort, lungs CTA bilaterally, no wheezing ABDOMEN:  soft, nontender, nondistended, no obvious masses,  normal bowel sounds RECTAL: Perianal skin tages. Initially though I saw a small anterior midline fissure but couldn't relocate it  and non-tender on exam. On anoscopy there was on mildly inflamed internal hemorrhoid SKIN:  turgor, no lesions seen Musculoskeletal:  Normal muscle  tone, normal strength NEURO: Alert and oriented x 3, no focal neurologic deficits    ASSESSMENT and PLAN:  1. Pleasant 76 year old female with acute lower abdominal discomfort and bowel changes, and rectal bleeding on Saturday. Pain and bleeding have resolved but stools are still smaller in caliber. Etiology? Ischemic colitis possible. IBD? She has a remote history of segmental colitis treated years ago with Mesalamine. There was concern for IBD but I think ultimately that this was felt not to be the case.  -Last colonoscopy was 9 years ago. Given rectal bleeding she needs another colonoscopy. The risks and benefits of the procedure were discussed and the patient agrees to proceed.   2. GERD / chronic intermittent dysphagia. Last EGD May 2017 was unremarkable.  -Advised patient to continue to eat small bites, chew well with liquids in between bites to avoid food impaction. -continue PPI. She is concerned about being on Protonix for so long. Given that she still has occasional symptoms on PPI I am hesitant to take her off treatment at this point.  -anti-reflux measures discussed.   3. Excessive fatigue. No pallor.  -Will check CBC.    Tye Savoy , NP 06/02/2017, 10:42 AM

## 2017-06-02 NOTE — Patient Instructions (Addendum)
If you are age 75 or older, your body mass index should be between 23-30. Your Body mass index is 22.3 kg/m. If this is out of the aforementioned range listed, please consider follow up with your Primary Care Provider.  If you are age 23 or younger, your body mass index should be between 19-25. Your Body mass index is 22.3 kg/m. If this is out of the aformentioned range listed, please consider follow up with your Primary Care Provider.   You have been scheduled for a colonoscopy. Please follow written instructions given to you at your visit today.  Please pick up your prep supplies at the pharmacy within the next 1-3 days. If you use inhalers (even only as needed), please bring them with you on the day of your procedure. Your physician has requested that you go to www.startemmi.com and enter the access code given to you at your visit today. This web site gives a general overview about your procedure. However, you should still follow specific instructions given to you by our office regarding your preparation for the procedure.   Your physician has requested that you go to the basement for the following lab work before leaving today: CBC  We have sent the following medications to your pharmacy for you to pick up at your convenience: Levsin  Thank you for choosing me and [facility].   Tye Savoy, NP

## 2017-06-05 ENCOUNTER — Telehealth: Payer: Self-pay

## 2017-06-05 NOTE — Telephone Encounter (Signed)
Patient advised of her CBC results. She asks to reschedule her colonoscopy 06/26/17. No further openings on the schedule.  Called back and had to leave a message. Advised I have NOT cancelled her colonoscopy on 06/26/17 because there is nowhere to reschedule her. I need for her to call back and confirm she does want to cancel.

## 2017-06-05 NOTE — Telephone Encounter (Signed)
Ok, thanks.

## 2017-06-05 NOTE — Progress Notes (Signed)
I agree with the above note, plan 

## 2017-06-05 NOTE — Telephone Encounter (Signed)
noted 

## 2017-06-05 NOTE — Telephone Encounter (Signed)
Pt said to leave her appt for 06/26/17

## 2017-06-05 NOTE — Telephone Encounter (Signed)
-----   Message from Willia Craze, NP sent at 06/04/2017  1:55 PM EDT ----- [name], please let patient know that CBC is normal. She is not anemic. Will see what colonoscopy shows. Thanks

## 2017-06-11 ENCOUNTER — Encounter: Payer: Self-pay | Admitting: Gastroenterology

## 2017-06-26 ENCOUNTER — Ambulatory Visit (AMBULATORY_SURGERY_CENTER): Payer: PPO | Admitting: Gastroenterology

## 2017-06-26 ENCOUNTER — Encounter: Payer: Self-pay | Admitting: Gastroenterology

## 2017-06-26 ENCOUNTER — Other Ambulatory Visit: Payer: Self-pay | Admitting: Family Medicine

## 2017-06-26 VITALS — BP 118/64 | HR 66 | Temp 98.4°F | Resp 11 | Ht 65.0 in | Wt 134.0 lb

## 2017-06-26 DIAGNOSIS — K625 Hemorrhage of anus and rectum: Secondary | ICD-10-CM | POA: Diagnosis not present

## 2017-06-26 DIAGNOSIS — K649 Unspecified hemorrhoids: Secondary | ICD-10-CM | POA: Diagnosis not present

## 2017-06-26 DIAGNOSIS — I10 Essential (primary) hypertension: Secondary | ICD-10-CM | POA: Diagnosis not present

## 2017-06-26 DIAGNOSIS — K5730 Diverticulosis of large intestine without perforation or abscess without bleeding: Secondary | ICD-10-CM | POA: Diagnosis not present

## 2017-06-26 DIAGNOSIS — K219 Gastro-esophageal reflux disease without esophagitis: Secondary | ICD-10-CM | POA: Diagnosis not present

## 2017-06-26 MED ORDER — SODIUM CHLORIDE 0.9 % IV SOLN: 500.0000 mL | INTRAVENOUS | Status: AC

## 2017-06-26 NOTE — Patient Instructions (Signed)
Diverticulosis (handout given) Hemorrhoids (handout given) High Fiber Diet (handout given)  YOU HAD AN ENDOSCOPIC PROCEDURE TODAY AT [facility]:   Refer to the procedure report that was given to you for any specific questions about what was found during the examination.  If the procedure report does not answer your questions, please call your gastroenterologist to clarify.  If you requested that your care partner not be given the details of your procedure findings, then the procedure report has been included in a sealed envelope for you to review at your convenience later.  YOU SHOULD EXPECT: Some feelings of bloating in the abdomen. Passage of more gas than usual.  Walking can help get rid of the air that was put into your GI tract during the procedure and reduce the bloating. If you had a lower endoscopy (such as a colonoscopy or flexible sigmoidoscopy) you may notice spotting of blood in your stool or on the toilet paper. If you underwent a bowel prep for your procedure, you may not have a normal bowel movement for a few days.  Please Note:  You might notice some irritation and congestion in your nose or some drainage.  This is from the oxygen used during your procedure.  There is no need for concern and it should clear up in a day or so.  SYMPTOMS TO REPORT IMMEDIATELY:   Following lower endoscopy (colonoscopy or flexible sigmoidoscopy):  Excessive amounts of blood in the stool  Significant tenderness or worsening of abdominal pains  Swelling of the abdomen that is new, acute  Fever of 100F or higher   Following upper endoscopy (EGD)  Vomiting of blood or coffee ground material  New chest pain or pain under the shoulder blades  Painful or persistently difficult swallowing  New shortness of breath  Fever of 100F or higher  Black, tarry-looking stools  For urgent or emergent issues, a gastroenterologist can be reached at any hour by calling [phone].   DIET:   We do recommend a small meal at first, but then you may proceed to your regular diet.  Drink plenty of fluids but you should avoid alcoholic beverages for 24 hours.  ACTIVITY:  You should plan to take it easy for the rest of today and you should NOT DRIVE or use heavy machinery until tomorrow (because of the sedation medicines used during the test).    FOLLOW UP: Our staff will call the number listed on your records the next business day following your procedure to check on you and address any questions or concerns that you may have regarding the information given to you following your procedure. If we do not reach you, we will leave a message.  However, if you are feeling well and you are not experiencing any problems, there is no need to return our call.  We will assume that you have returned to your regular daily activities without incident.  If any biopsies were taken you will be contacted by phone or by letter within the next 1-3 weeks.  Please call us at [phone] if you have not heard about the biopsies in 3 weeks.    SIGNATURES/CONFIDENTIALITY: You and/or your care partner have signed paperwork which will be entered into your electronic medical record.  These signatures attest to the fact that that the information above on your After Visit Summary has been reviewed and is understood.  Full responsibility of the confidentiality of this discharge information lies with you and/or your care-partner.

## 2017-06-26 NOTE — Progress Notes (Signed)
Report to PACU, RN, vss, BBS= Clear.  

## 2017-06-26 NOTE — Op Note (Signed)
[facility] Patient Name: [name] Procedure Date: 06/26/2017 8:03 AM MRN: [id number] Endoscopist: Milus Banister , MD Age: 76 Referring MD:  Date of Birth: 11/13/1941 Gender: Female Account #: [id number] Procedure:                Colonoscopy Indications:              Rectal bleeding in setting of worse than usual                            constipation, last colonoscopy 9 years ago Medicines:                Monitored Anesthesia Care Procedure:                Pre-Anesthesia Assessment:                           - Prior to the procedure, a History and Physical                            was performed, and patient medications and                            allergies were reviewed. The patient's tolerance of                            previous anesthesia was also reviewed. The risks                            and benefits of the procedure and the sedation                            options and risks were discussed with the patient.                            All questions were answered, and informed consent                            was obtained. Prior Anticoagulants: The patient has                            taken no previous anticoagulant or antiplatelet                            agents. ASA Grade Assessment: II - A patient with                            mild systemic disease. After reviewing the risks                            and benefits, the patient was deemed in                            satisfactory condition to undergo the procedure.  After obtaining informed consent, the colonoscope                            was passed under direct vision. Throughout the                            procedure, the patient's blood pressure, pulse, and                            oxygen saturations were monitored continuously. The                            Colonoscope was introduced through the anus and                            advanced to the the  cecum, identified by                            appendiceal orifice and ileocecal valve. The                            colonoscopy was performed without difficulty. The                            patient tolerated the procedure well. The quality                            of the bowel preparation was excellent. The                            ileocecal valve, appendiceal orifice, and rectum                            were photographed. Findings:                 Multiple small and large-mouthed diverticula were                            found in the left colon.                           Internal hemorrhoids were found. The hemorrhoids                            were small.                           The exam was otherwise without abnormality on                            direct and retroflexion views. Complications:            No immediate complications. Estimated blood loss:                            None. Estimated Blood  Loss:     Estimated blood loss: none. Impression:               - Diverticulosis in the left colon.                           - Internal hemorrhoids.                           - The examination was otherwise normal on direct                            and retroflexion views.                           - No specimens collected. Recommendation:           - Patient has a contact number available for                            emergencies. The signs and symptoms of potential                            delayed complications were discussed with the                            patient. Return to normal activities tomorrow.                            Written discharge instructions were provided to the                            patient.                           - Resume previous diet.                           - Continue present medications. Please start                            citrucel orange flavored powder fiber supplement in                            addition to your  daily miralax and call my office                            in 3-4 weeks to report on your response.                           - You do not need any further colon cancer                            screening tests (including stool testing). These                            types of tests  generally stop around age 26-80. Milus Banister, MD 06/26/2017 8:12:22 AM This report has been signed electronically.

## 2017-06-29 ENCOUNTER — Telehealth: Payer: Self-pay | Admitting: *Deleted

## 2017-06-29 NOTE — Telephone Encounter (Signed)
  Follow up Call-  Call back number 06/26/2017 04/14/2016  Post procedure Call Back phone  # [phone]  Permission to leave phone message Yes Yes  Some recent data might be hidden     Patient questions:  Do you have a fever, pain , or abdominal swelling? No. Pain Score  0 *  Have you tolerated food without any problems? Yes.    Have you been able to return to your normal activities? Yes.    Do you have any questions about your discharge instructions: Diet   No. Medications  No. Follow up visit  No.  Do you have questions or concerns about your Care? No.  Actions: * If pain score is 4 or above: No action needed, pain <4.

## 2017-09-02 DIAGNOSIS — L738 Other specified follicular disorders: Secondary | ICD-10-CM | POA: Diagnosis not present

## 2017-09-02 DIAGNOSIS — L720 Epidermal cyst: Secondary | ICD-10-CM | POA: Diagnosis not present

## 2017-09-02 DIAGNOSIS — L570 Actinic keratosis: Secondary | ICD-10-CM | POA: Diagnosis not present

## 2017-09-03 ENCOUNTER — Other Ambulatory Visit: Payer: Self-pay | Admitting: Family Medicine

## 2017-09-03 ENCOUNTER — Other Ambulatory Visit: Payer: Self-pay | Admitting: Obstetrics and Gynecology

## 2017-09-03 ENCOUNTER — Encounter: Payer: Self-pay | Admitting: Family Medicine

## 2017-09-03 DIAGNOSIS — Z1231 Encounter for screening mammogram for malignant neoplasm of breast: Secondary | ICD-10-CM

## 2017-09-03 NOTE — Telephone Encounter (Signed)
Last CPE and RX was 10/06/16 #90 +1, no refill since, please advise

## 2017-09-03 NOTE — Telephone Encounter (Signed)
Please schedule appt, then ok to send refill to get to appt, not more the #30. Thanks.

## 2017-09-04 NOTE — Telephone Encounter (Signed)
LMTCB

## 2017-09-07 NOTE — Telephone Encounter (Signed)
I called the pt and informed her of the message below.  Patient stated she has an appt scheduled for 10/25 and she is aware a 30-day supply was sent to her pharmacy.

## 2017-09-18 ENCOUNTER — Ambulatory Visit
Admission: RE | Admit: 2017-09-18 | Discharge: 2017-09-18 | Disposition: A | Discharge status: Home / Self Care | Payer: PPO | Source: Ambulatory Visit | Attending: Obstetrics and Gynecology | Admitting: Obstetrics and Gynecology

## 2017-09-18 DIAGNOSIS — Z1231 Encounter for screening mammogram for malignant neoplasm of breast: Secondary | ICD-10-CM

## 2017-09-19 IMAGING — CT CT CHEST W/O CM
2 of 3 series · 15 of 36 positions shown, 18 images · non-contrast
Comparison: 12/22/2015

CLINICAL DATA: Three month follow-up of lung nodule

EXAM:
CT CHEST WITHOUT CONTRAST
TECHNIQUE: Multidetector CT imaging of the chest was performed following the
standard protocol without IV contrast.

[Series 2: thorax · axial · 0.74mm/px · z∈[-270,+2]mm · 12 of 160 slices shown, 15 images]
[im 12/160  mediastinal]
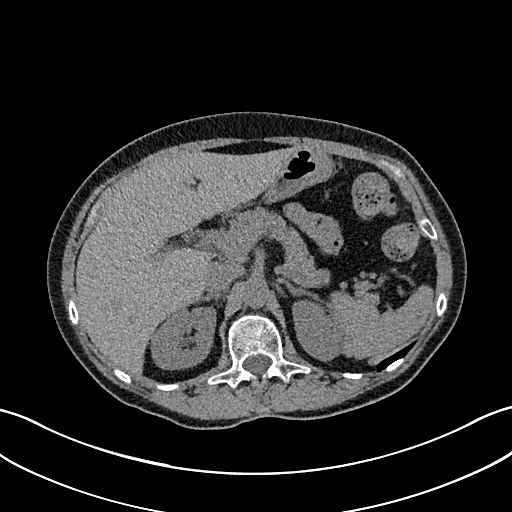
[im 12/160  lung]
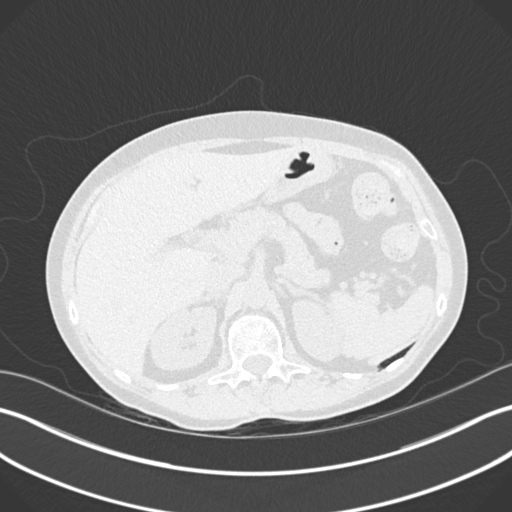
[im 24/160  lung]
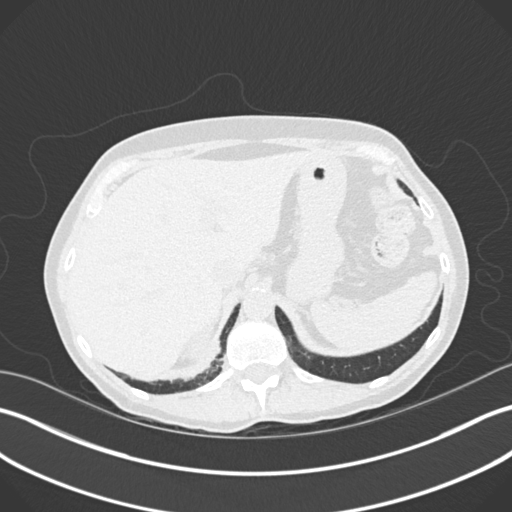
[im 36/160  lung]
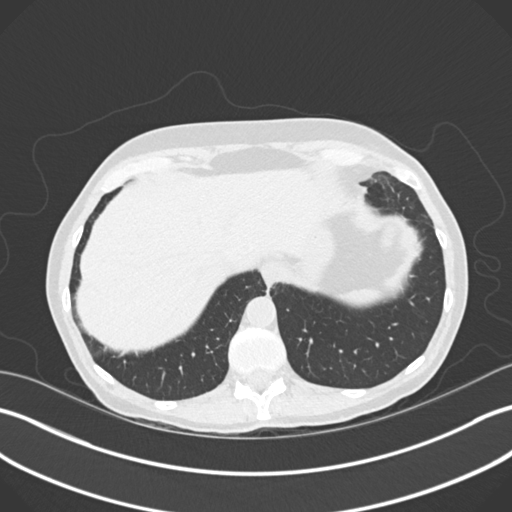
[im 48/160  lung]
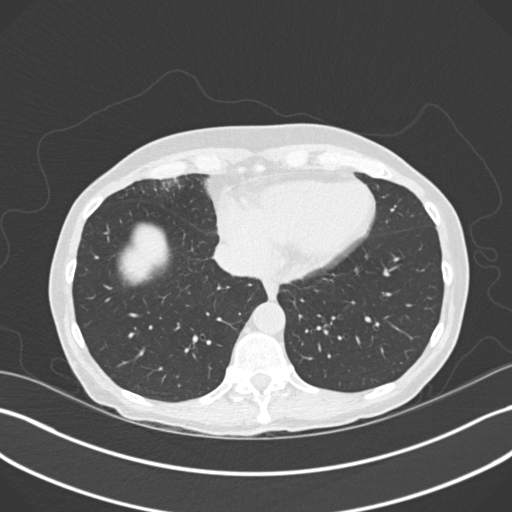
[im 59/160  mediastinal]
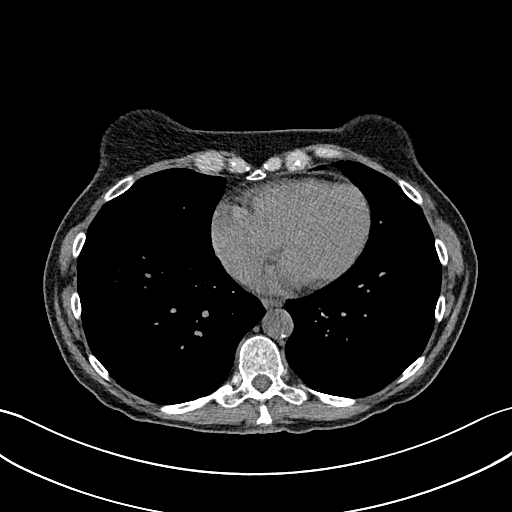
[im 59/160  lung]
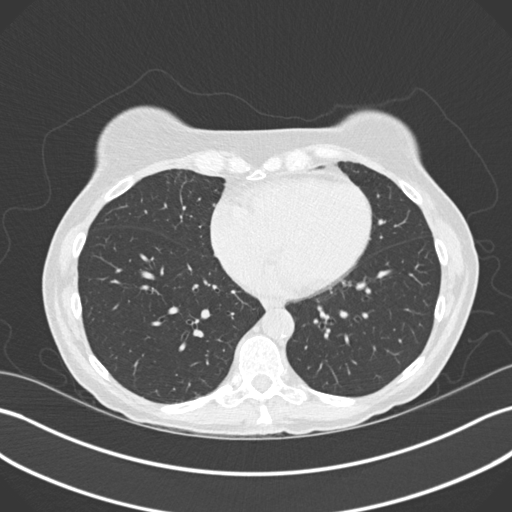
[im 71/160  lung]
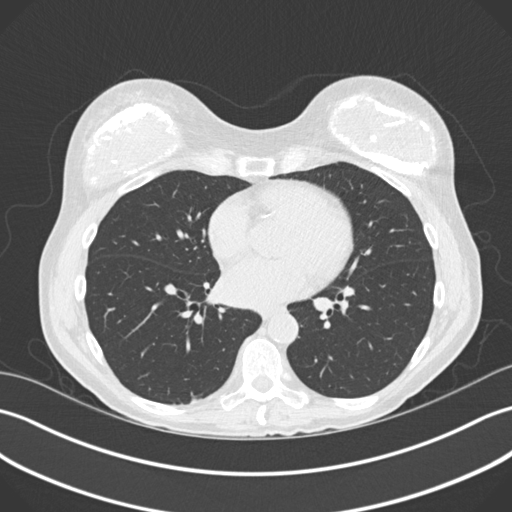
[im 89/160  lung]
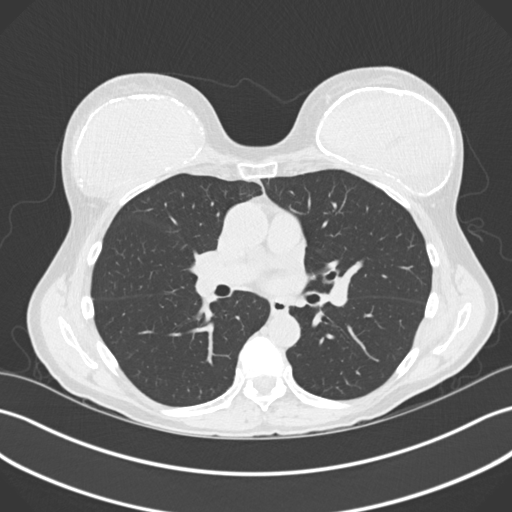
[im 101/160  lung]
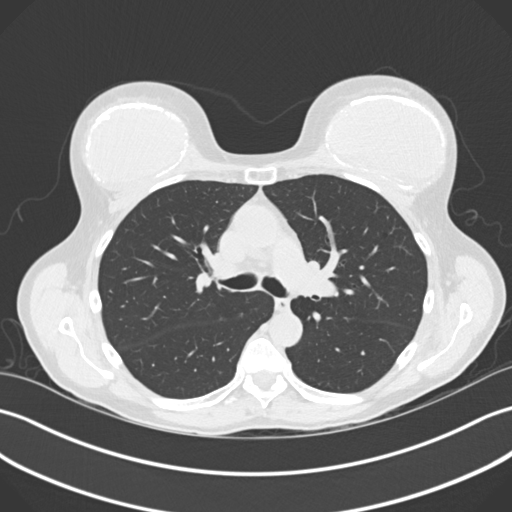
[im 112/160  mediastinal]
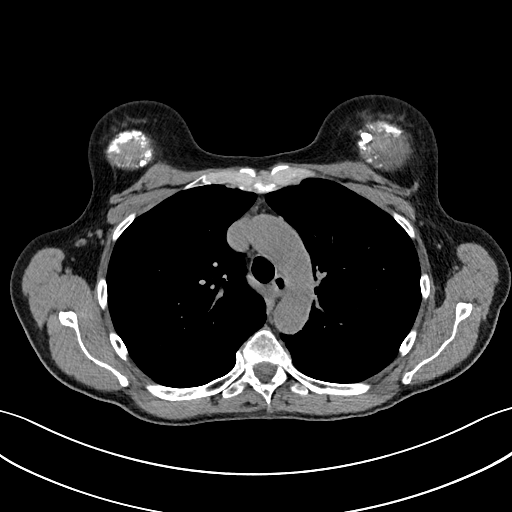
[im 112/160  lung]
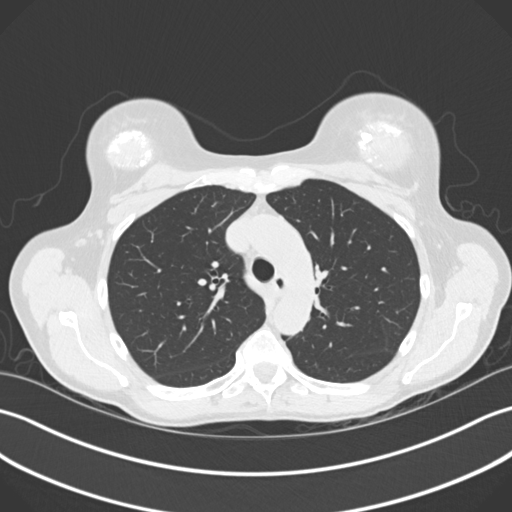
[im 124/160  lung]
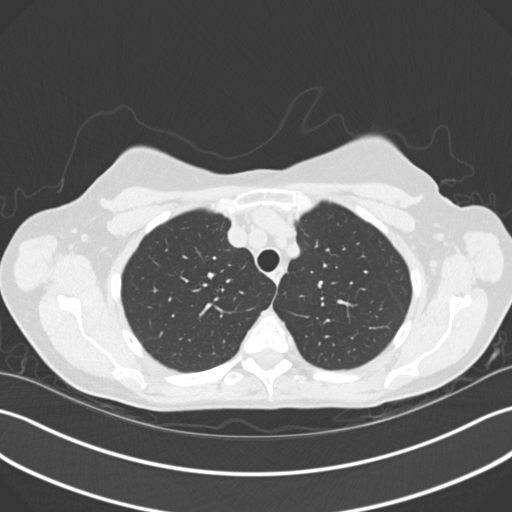
[im 136/160  lung]
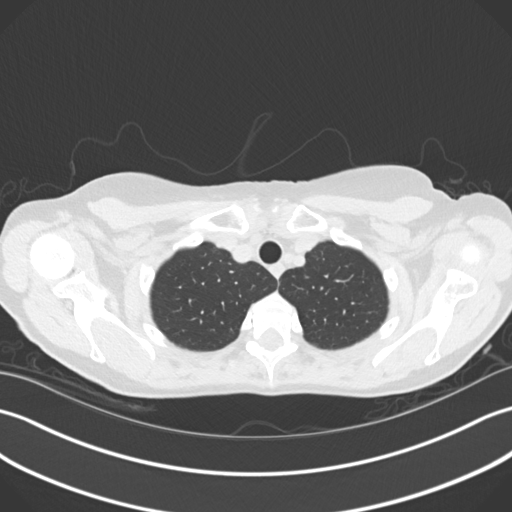
[im 148/160  lung]
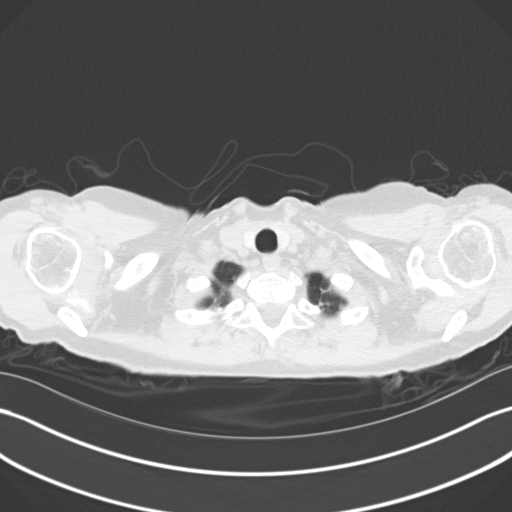

[Series 5: coronal · coronal · 0.62mm/px · 3 of 151 slices shown]
[im 31/151  lung]
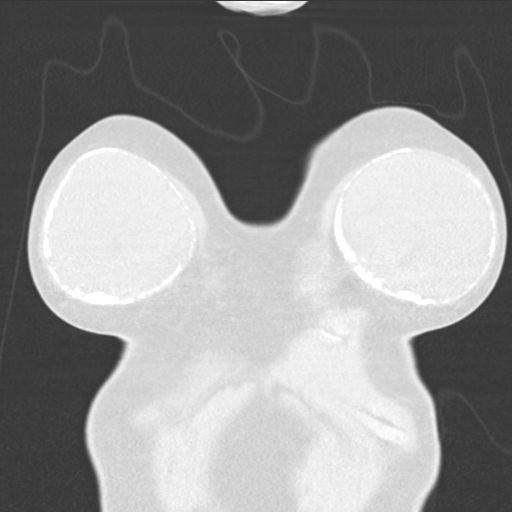
[im 61/151  lung]
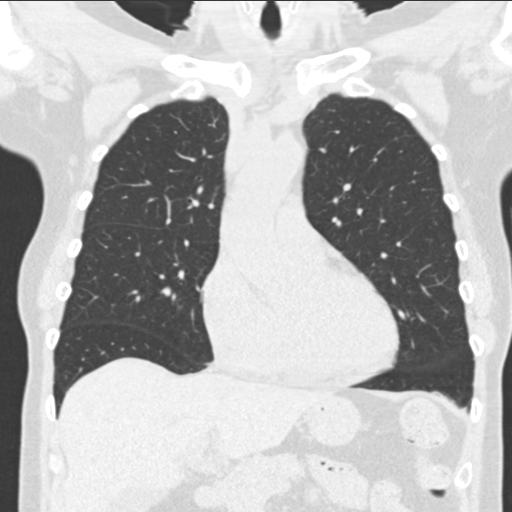
[im 91/151  lung]
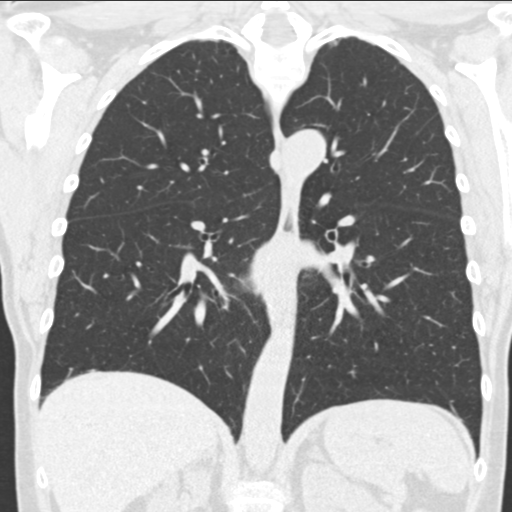

[15 of 36 positions shown; findings below may reference images not displayed]

FINDINGS: THORACIC INLET/BODY WALL:

Bilateral subglandular probable dual-lumen breast implant with
extensive capsular calcification.

No mass or adenopathy

MEDIASTINUM:

Normal heart size. No pericardial effusion. Coronary atherosclerosis
seen in the proximal bilateral coronary circulation. No evidence of
acute vascular disease. No adenopathy. Negative esophagus.

LUNG WINDOWS:

With improved inflation and the previously reported subpleural right
middle lobe nodule now has a flat scar-like appearance. The bulk is
also decreased; on sagittal reformats craniocaudal span was
previously 8 mm, now 4 mm. Scarring was seen in this location
06/22/2011. No growing or otherwise suspicious pulmonary nodule.

UPPER ABDOMEN:

No acute findings.

OSSEOUS:

No acute fracture.  No suspicious lytic or blastic lesions.
IMPRESSION: The right middle lobe nodule described 12/22/2015 has a flat
scar-like appearance today. No suspicious nodule is seen.

## 2017-09-22 ENCOUNTER — Encounter: Payer: Self-pay | Admitting: Family Medicine

## 2017-09-22 ENCOUNTER — Ambulatory Visit (INDEPENDENT_AMBULATORY_CARE_PROVIDER_SITE_OTHER): Payer: PPO | Admitting: Family Medicine

## 2017-09-22 VITALS — BP 112/60 | HR 68 | Temp 98.3°F | Ht 65.0 in | Wt 137.4 lb

## 2017-09-22 DIAGNOSIS — Z8679 Personal history of other diseases of the circulatory system: Secondary | ICD-10-CM | POA: Diagnosis not present

## 2017-09-22 DIAGNOSIS — F39 Unspecified mood [affective] disorder: Secondary | ICD-10-CM

## 2017-09-22 DIAGNOSIS — J302 Other seasonal allergic rhinitis: Secondary | ICD-10-CM | POA: Diagnosis not present

## 2017-09-22 MED ORDER — FLUTICASONE PROPIONATE 50 MCG/ACT NA SUSP: 2.0000 | Freq: Every day | NASAL | 6 refills | Status: DC

## 2017-09-22 MED ORDER — CETIRIZINE HCL 10 MG PO TABS: 5.0000 mg | ORAL_TABLET | Freq: Every day | ORAL | 11 refills | Status: DC

## 2017-09-22 MED ORDER — METOPROLOL TARTRATE 25 MG PO TABS: 12.5000 mg | ORAL_TABLET | Freq: Every day | ORAL | 3 refills | Status: DC

## 2017-09-22 NOTE — Patient Instructions (Addendum)
BEFORE YOU LEAVE: -follow up: AWV with Manuela Schwartz and CPE with Dr. Maudie Mercury in 1 month  Start the zyrtec 1/2 tablet nightly  Start flonase 2 sprays each nostril daily  Call today to schedule CBT  Continue the efexor  Decrease the metoprolol to 12.5 mg daily  Follow up sooner if worsening or other concerns

## 2017-09-22 NOTE — Progress Notes (Signed)
HPI:  Acute visit for follow up and several issues. For the last 2 weeks has had increased nasal congestion, itchy nose, tearing of the eyes, sneezing, pnd and some fatigue. Allergy medications are expired. She is working out and feels metoprolol is too strong - takes for hx migraines and palpitations but has not had either of these recently and wants to decrease dose. Best friend and dog died recently and she has had some increased anxiety, poor sleep and mild depression with this. No severe symptoms, SI, etc.  No SOB, CP, palpitations, wheezing.  GAD: -meds: effexor -used to see Dr. Toy Care  HLD: -advised lifestyle changes -ratio 3  Palpitations: -on metoprolol from prior PCP for this  IBS/GERD: -meds: protonix, mirilax, levson  Migraines: -uses imitrex prn  ROS: See pertinent positives and negatives per HPI.  Past Medical History:  Diagnosis Date  . Actinic keratosis 12/03/2012  . Anxiety    Effexor helps--psychiatrist Dr Toy Care  . Arthritis    osteoarthritis, hands  . Atypical chest pain    EF 86%, breast attenuation, no significant ischemia  . Cervical radiculopathy 11/18/2013  . Chronic headache   . Esophagitis 01/07/2010   Qualifier: Diagnosis of  By: Surface RN, Butch Penny    . GERD (gastroesophageal reflux disease) 11/2009   EGD: Dr. Sharlett Iles: erosive stricture dilated  . ISCHEMIC COLITIS 06/05/2008   Qualifier: Diagnosis of  By: Nils Pyle CMA ([place]), Mearl Latin    . Migraines   . 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 Osteoporosis 02/2012   t score -2.5 spine  . Palpitations   . Rotator cuff tendonitis 05/26/2011  . 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 AUGMENTATION MAMMAPLASTY    . Birth mark removed    . CATARACT EXTRACTION Left   . COLPOSCOPY    . laser vaporization of upper vagina  1998  . NM MYOCAR PERF WALL MOTION  01/24/2008   EF 86% neg ischemia  . VAGINAL HYSTERECTOMY  1978     Family History  Problem Relation Age of Onset  . Heart disease Mother   . Heart failure Mother   . Breast cancer Mother 43  . Prostate cancer Father   . Inflammatory bowel disease Sister   . Diabetes Maternal Aunt   . Colon cancer Paternal Grandfather   . Stomach cancer Neg Hx   . Pancreatic cancer Neg Hx     Social History   Social History  . Marital status: Married    Spouse name: N/A  . Number of children: N/A  . Years of education: N/A   Social History Main Topics  . Smoking status: Former Smoker    Packs/day: 1.00    Years: 10.00    Types: Cigarettes    Quit date: 12/15/1988  . Smokeless tobacco: Never Used     Comment: quit in the 1980's  . Alcohol use 7.0 oz/week    14 Standard drinks or equivalent per week     Comment: 1 glass every few days  . Drug use: No  . Sexual activity: No     Comment: HYST   Other Topics Concern  . None   Social History Narrative   Married, mother of one.  Lives with spouse and 4 dogs.   Very active. Regular exercise  -- yes.  Quit smoking in 1990.     She monitors her diet well.     1-2 glasses of wine  almost every night.     Current Outpatient Prescriptions:  .  azelastine (ASTELIN) 0.1 % nasal spray, USE TWO SPRAYS IN EACH NOSTRIL TWICE DAILY AS DIRECTED, Disp: 30 mL, Rfl: 5 .  beta carotene w/minerals (OCUVITE) tablet, Take 1 tablet by mouth daily., Disp: , Rfl:  .  Bilberry, Vaccinium myrtillus, (BILBERRY PO), Take 1 capsule by mouth daily., Disp: , Rfl:  .  hyoscyamine (LEVSIN, ANASPAZ) 0.125 MG tablet, Take 1 tablet (0.125 mg total) by mouth every 8 (eight) hours. As needed, Disp: 30 tablet, Rfl: 0 .  metoprolol tartrate (LOPRESSOR) 25 MG tablet, Take 0.5 tablets (12.5 mg total) by mouth daily., Disp: 45 tablet, Rfl: 3 .  Multiple Vitamins-Minerals (ICAPS LUTEIN & ZEAXANTHIN PO), Take by mouth., Disp: , Rfl:  .  pantoprazole (PROTONIX) 40 MG tablet, TAKE ONE TABLET BY MOUTH ONCE DAILY, Disp: 90 tablet, Rfl: 0 .   polyethylene glycol (MIRALAX / GLYCOLAX) packet, Take 17 g by mouth daily., Disp: 14 each, Rfl: 0 .  SUMAtriptan (IMITREX) 50 MG tablet, Take 1 tablet (50 mg total) by mouth as needed. For severe headache., Disp: 10 tablet, Rfl: 3 .  venlafaxine XR (EFFEXOR-XR) 75 MG 24 hr capsule, TAKE ONE CAPSULE BY MOUTH ONCE DAILY WITH  BREAKFAST, Disp: 30 capsule, Rfl: 0 .  cetirizine (ZYRTEC) 10 MG tablet, Take 0.5 tablets (5 mg total) by mouth daily., Disp: 30 tablet, Rfl: 11 .  fluticasone (FLONASE) 50 MCG/ACT nasal spray, Place 2 sprays into both nostrils daily., Disp: 16 g, Rfl: 6  Current Facility-Administered Medications:  .  0.9 %  sodium chloride infusion, 500 mL, Intravenous, Continuous, Milus Banister, MD  EXAM:  Vitals:   09/22/17 1431  BP: 112/60  Pulse: 68  Temp: 98.3 F (36.8 C)  SpO2: 95%    Body mass index is 22.86 kg/m.  GENERAL: vitals reviewed and listed above, alert, oriented, appears well hydrated and in no acute distress  HEENT: atraumatic, conjunttiva clear, no obvious abnormalities on inspection of external nose and ears, normal appearance of ear canals and TMs, clear nasal congestion, mild post oropharyngeal erythema with PND, no tonsillar edema or exudate, no sinus TTP  NECK: no obvious masses on inspection  LUNGS: clear to auscultation bilaterally, no wheezes, rales or rhonchi, good air movement  CV: HRRR, no peripheral edema  MS: moves all extremities without noticeable abnormality  PSYCH: pleasant and cooperative, no obvious depression or anxiety  ASSESSMENT AND PLAN:  Discussed the following assessment and plan:  Seasonal allergic rhinitis, unspecified trigger  Mood disorder (HCC)  History of palpitations in adulthood  -see pt instructions - discussed potential etiologies, treatment options symptoms -declined flu shot -follow up, cpe, AWV in 1 month -Patient advised to return or notify a doctor immediately if symptoms worsen or persist or new  concerns arise.  Patient Instructions  BEFORE YOU LEAVE: -follow up: AWV with Manuela Schwartz and CPE with Dr. Maudie Mercury in 1 month  Start the zyrtec 1/2 tablet nightly  Start flonase 2 sprays each nostril daily  Call today to schedule CBT  Continue the efexor  Decrease the metoprolol to 12.5 mg daily  Follow up sooner if worsening or other concerns    Colin Benton R., DO

## 2017-10-08 ENCOUNTER — Ambulatory Visit: Payer: PPO | Admitting: Family Medicine

## 2017-10-26 NOTE — Progress Notes (Signed)
HPI:  Here for CPE: Due for labs, flu shot, dexa.  -Concerns and/or follow up today:  Seeing Manuela Schwartz for AWV today.  Elevated BP: -she is resistant to taking medications -does take metoprolol and did just increase to 25mg  -no CP, SOB, DOE -does have headaches sometimes, hx migraines   GAD: -meds: effexor -used to see Dr. Toy Care  HLD: -advised lifestyle changes -ratio 3  Palpitations: -on metoprolol from prior PCP for this  IBS/GERD: -meds: protonix, mirilax, levson  Migraines: -uses imitrex prn  -Diet: variety of foods, balance and well rounded, larger portion sizes -Exercise: no regular exercise -Taking folic acid, vitamin D or calcium: no -Diabetes and Dyslipidemia Screening: Fasting for labs -Vaccines: see vaccine section EPIC -pap history: sees Dr. Marvel Plan and had pap 11/2016 -FDLMP: see nursing notes -sexual activity: yes, female partner, no new partners -wants STI testing (Hep C if born [birth year]): no -FH breast, colon or ovarian ca: see FH Last mammogram: 09/2017 birads 1 Last colon cancer screening: last colonoscopy 06/2017 with Dr. Ardis Hughs   Breast Ca Risk Assessment: see family history and pt history DEXA (>/= 65): last DEXA 2016 with osteopenia.  She discussed with Manuela Schwartz and Manuela Schwartz is checking to see if this is being done with her gynecologist, if not she will order.  -Alcohol, Tobacco, drug use: see social history  Review of Systems - no fevers, unintentional weight loss, vision loss, hearing loss, chest pain, sob, hemoptysis, melena, hematochezia, hematuria, genital discharge, changing or concerning skin lesions, bleeding, bruising, loc, thoughts of self harm or SI  Past Medical History:  Diagnosis Date  . Actinic keratosis 12/03/2012  . Anxiety    Effexor helps--psychiatrist Dr Toy Care  . Arthritis    osteoarthritis, hands  . Atypical chest pain    EF 86%, breast attenuation, no significant ischemia  . Cervical radiculopathy 11/18/2013  . Chronic  headache   . Esophagitis 01/07/2010   Qualifier: Diagnosis of  By: Surface RN, Butch Penny    . GERD (gastroesophageal reflux disease) 11/2009   EGD: Dr. Sharlett Iles: erosive stricture dilated  . ISCHEMIC COLITIS 06/05/2008   Qualifier: Diagnosis of  By: Nils Pyle CMA ([place]), Mearl Latin    . Migraines   . 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 Osteoporosis 02/2012   t score -2.5 spine  . Palpitations   . Rotator cuff tendonitis 05/26/2011  . 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 AUGMENTATION MAMMAPLASTY    . Birth mark removed    . CATARACT EXTRACTION Left   . COLPOSCOPY    . laser vaporization of upper vagina  1998  . NM MYOCAR PERF WALL MOTION  01/24/2008   EF 86% neg ischemia  . VAGINAL HYSTERECTOMY  1978    Family History  Problem Relation Age of Onset  . Heart disease Mother   . Heart failure Mother   . Breast cancer Mother 57  . Prostate cancer Father   . Inflammatory bowel disease Sister   . Diabetes Maternal Aunt   . Colon cancer Paternal Grandfather   . Stomach cancer Neg Hx   . Pancreatic cancer Neg Hx     Social History   Socioeconomic History  . Marital status: Married    Spouse name: None  . Number of children: None  . Years of education: None  . Highest education level: None  Social Needs  . Financial resource strain: None  . Food insecurity - worry: None  .  Food insecurity - inability: None  . Transportation needs - medical: None  . Transportation needs - non-medical: None  Occupational History  . None  Tobacco Use  . Smoking status: Former Smoker    Packs/day: 1.00    Years: 10.00    Pack years: 10.00    Types: Cigarettes    Last attempt to quit: 12/15/1988    Years since quitting: 28.8  . Smokeless tobacco: Never Used  . Tobacco comment: quit in the 1980's  Substance and Sexual Activity  . Alcohol use: Yes    Alcohol/week: 7.0 oz    Types: 14 Standard drinks or  equivalent per week    Comment: drinks wine every day; 2 drinks a day   . Drug use: No  . Sexual activity: No    Birth control/protection: Surgical, Post-menopausal    Comment: HYST  Other Topics Concern  . None  Social History Narrative   Married, mother of one.  Lives with spouse and 4 dogs.   Very active. Regular exercise  -- yes.  Quit smoking in 1990.     She monitors her diet well.     1-2 glasses of wine almost every night.     Current Outpatient Medications:  .  beta carotene w/minerals (OCUVITE) tablet, Take 1 tablet by mouth daily., Disp: , Rfl:  .  cetirizine (ZYRTEC) 10 MG tablet, Take 0.5 tablets (5 mg total) by mouth daily., Disp: 30 tablet, Rfl: 11 .  Cholecalciferol (D3-1000 PO), Take by mouth., Disp: , Rfl:  .  Fish Oil-Cholecalciferol (OMEGA-3 FISH OIL/VITAMIN D3 PO), Take by mouth., Disp: , Rfl:  .  hyoscyamine (LEVSIN, ANASPAZ) 0.125 MG tablet, Take 1 tablet (0.125 mg total) by mouth every 8 (eight) hours. As needed, Disp: 30 tablet, Rfl: 0 .  metoprolol tartrate (LOPRESSOR) 25 MG tablet, Take 0.5 tablets (12.5 mg total) by mouth daily., Disp: 45 tablet, Rfl: 3 .  OVER THE COUNTER MEDICATION, 4 mg., Disp: , Rfl:  .  pantoprazole (PROTONIX) 40 MG tablet, TAKE ONE TABLET BY MOUTH ONCE DAILY, Disp: 90 tablet, Rfl: 0 .  polyethylene glycol (MIRALAX / GLYCOLAX) packet, Take 17 g by mouth daily., Disp: 14 each, Rfl: 0 .  SUMAtriptan (IMITREX) 50 MG tablet, Take 1 tablet (50 mg total) by mouth as needed. For severe headache., Disp: 10 tablet, Rfl: 3 .  venlafaxine XR (EFFEXOR-XR) 75 MG 24 hr capsule, TAKE ONE CAPSULE BY MOUTH ONCE DAILY WITH  BREAKFAST, Disp: 30 capsule, Rfl: 0 .  azelastine (ASTELIN) 0.1 % nasal spray, USE TWO SPRAYS IN EACH NOSTRIL TWICE DAILY AS DIRECTED (Patient not taking: Reported on 10/27/2017), Disp: 30 mL, Rfl: 5 .  Bilberry, Vaccinium myrtillus, (BILBERRY PO), Take 1 capsule by mouth daily., Disp: , Rfl:  .  fluticasone (FLONASE) 50 MCG/ACT nasal  spray, Place 2 sprays into both nostrils daily. (Patient not taking: Reported on 10/27/2017), Disp: 16 g, Rfl: 6 .  Multiple Vitamins-Minerals (ICAPS LUTEIN & ZEAXANTHIN PO), Take by mouth., Disp: , Rfl:   Current Facility-Administered Medications:  .  0.9 %  sodium chloride infusion, 500 mL, Intravenous, Continuous, Milus Banister, MD  EXAM:  Vitals:   10/27/17 0916 10/27/17 1009  BP: (!) 160/80 140/70  Pulse: 84 61  SpO2: 90%     GENERAL: vitals reviewed and listed below, alert, oriented, appears well hydrated and in no acute distress  HEENT: head atraumatic, PERRLA, normal appearance of eyes, ears, nose and mouth. moist mucus membranes.  NECK: supple, no  masses or lymphadenopathy  LUNGS: clear to auscultation bilaterally, no rales, rhonchi or wheeze  CV: HRRR, no peripheral edema or cyanosis, normal pedal pulses  ABDOMEN: bowel sounds normal, soft, non tender to palpation, no masses, no rebound or guarding  GU/BREAST: declined, does with gyn  SKIN: no rash or abnormal lesions  MS: normal gait, moves all extremities normally  NEURO: normal gait, speech and thought processing grossly intact, muscle tone grossly intact throughout  PSYCH: normal affect, pleasant and cooperative  ASSESSMENT AND PLAN:  Discussed the following assessment and plan:  PREVENTIVE EXAM: -Discussed and advised all [place] preventive services health task force level A and B recommendations for age, sex and risks. -Advised at least 150 minutes of exercise per week and a healthy diet with avoidance of (less then 1 serving per week) processed foods, white starches, red meat, fast foods and sweets and consisting of: * 5-9 servings of fresh fruits and vegetables (not corn or potatoes) *nuts and seeds, beans *olives and olive oil *lean meats such as fish and white chicken  *whole grains -labs, studies and vaccines per orders this encounter  Elevated blood pressure reading - Plan: Basic metabolic panel,  CBC -discussed options and will try diuretic, norvasc or losartan pending lab results -follow up 1 mont  Palpitation -cont metoprolol, stable  Hyperlipidemia, unspecified hyperlipidemia type - Plan: Lipid panel  GAD (generalized anxiety disorder) -stable     Patient advised to return to clinic immediately if symptoms worsen or persist or new concerns.  Patient Instructions   BEFORE YOU LEAVE: -follow up: 1 months -labs  We will plan to add a blood pressure medication once we check the labs. Take once daily.    Ms. Wegman , Thank you for taking time to come for your Medicare Wellness Visit. I appreciate your ongoing commitment to your health goals. Please review the following plan we discussed and let me know if I can assist you in the future.   Keep in mind the flu shot is an inactivated vaccine and takes at least 2 weeks to build immunity. The flu virus can be dormant for 4 days prior to symptoms Taking the flu shot at the beginning of the season can reduce the risk for the entire community.   Shingrix is a vaccine for the prevention of Shingles in Adults 50 and older.  If you are on Medicare, you can request a prescription from your doctor to be filled at a pharmacy.  Please check with your benefits regarding applicable copays or out of pocket expenses.  The Shingrix is given in 2 vaccines approx 8 weeks apart. You must receive the 2nd dose prior to 6 months from receipt of the first.   Recommendations for Dexa Scan Female over the age of 33 Man age 65 or older If you broke a bone past the age of 26 Women menopausal age with risk factors (thin frame; smoker; hx of fx ) Post menopausal women under the age of 45 with risk factors A man age 13 to 11 with risk factors Other: Spine xray that is showing break of bone loss Back pain with possible break Height loss of 1/2 inch or more within one year Total loss in height of 1.5 inches from your original height  Calcium  1200mg  with Vit D 800u per day; more as directed by physician Strength building exercises discussed; can include walking; housework; small weights or stretch bands; silver sneakers if access to the [facility]  Please visit the osteoporosis  [email or web address] for up to date recommendations  Will send you information on Advanced Directives via email ( EMMI) Will try to complete AD; Given copy  Referred to [facility] for questions [place] offers free advance directive forms, as well as assistance in completing the forms themselves. For assistance, contact the Spiritual Care Department at [phone], or the Clinical Social Work Department at [phone].   These are the goals we discussed:will relax more; see note  Goals    None      This is a list of the screening recommended for you and due dates:  Health Maintenance  Topic Date Due  . Flu Shot  07/15/2017  . Tetanus Vaccine  07/31/2018  . Mammogram  09/18/2018  . Colon Cancer Screening  06/27/2027  . DEXA scan (bone density measurement)  Completed  . Pneumonia vaccines  Completed   Prevention of falls: Remove rugs or any tripping hazards in the home Use Non slip mats in bathtubs and showers Placing grab bars next to the toilet and or shower Placing handrails on both sides of the stair way Adding extra lighting in the home.   Personal safety issues reviewed:  1. Consider starting a community watch program per [facility] 2.  Changes batteries is smoke detector and/or carbon monoxide detector  3.  If you have firearms; keep them in a safe place 4.  Wear protection when in the sun; Always wear sunscreen or a hat; It is good to have your doctor check your skin annually or review any new areas of concern 5. Driving safety; Keep in the right lane; stay 3 car lengths behind the car in front of you on the highway; look 3 times prior to pulling out; carry your cell phone everywhere you go!    Learn about the Yellow Dot program:  The  program allows first responders at your emergency to have access to who your physician is, as well as your medications and medical conditions.  Citizens requesting the Yellow Dot Packages should contact Master Corporal Nunzio Cobbs at the [facility] [phone] for the first week of the program and beginning the week after Easter citizens should contact their Scientist, physiological.  Summary: Preventive Care for Adults  A healthy lifestyle and preventive care can promote health and wellness. Preventive health guidelines for adults include the following key practices.  . A routine yearly physical is a good way to check with your health care provider about your health and preventive screening. It is a chance to share any concerns and updates on your health and to receive a thorough exam.  . Visit your dentist for a routine exam and preventive care every 6 months. Brush your teeth twice a day and floss once a day. Good oral hygiene prevents tooth decay and gum disease.  . The frequency of eye exams is based on your age, health, family medical history, use  of contact lenses, and other factors. Follow your health care provider's ecommendations for frequency of eye exams.  . Eat a healthy diet. Foods like vegetables, fruits, whole grains, low-fat dairy products, and lean protein foods contain the nutrients you need without too many calories. Decrease your intake of foods high in solid fats, added sugars, and salt. Eat the right amount of calories for you. Get information about a proper diet from your health care provider, if necessary.  . Regular physical exercise is one of the most important things you can do for your health. Most  adults should get at least 150 minutes of moderate-intensity exercise (any activity that increases your heart rate and causes you to sweat) each week. In addition, most adults need muscle-strengthening exercises on 2 or more days a  week.  Silver Sneakers may be a benefit available to you. To determine eligibility, you may visit the website: www.silversneakers.com or contact program at 208-051-7997 Mon-Fri between 8AM-8PM.   . Maintain a healthy weight. The body mass index (BMI) is a screening tool to identify possible weight problems. It provides an estimate of body fat based on height and weight. Your health care provider can find your BMI and can help you achieve or maintain a healthy weight.   For adults 20 years and older: ? A BMI below 18.5 is considered underweight. ? A BMI of 18.5 to 24.9 is normal. ? A BMI of 27 to 28 is considered normal by the Institutes of Health  ? A BMI of 30 and above is considered obese.   . Maintain normal blood lipids and cholesterol levels by exercising and minimizing your intake of saturated fat. Eat a balanced diet with plenty of fruit and vegetables. Blood tests for lipids and cholesterol should begin at age 66 and be repeated every 5 years. If your lipid or cholesterol levels are high, you are over 50, or you are at high risk for heart disease, you may need your cholesterol levels checked more frequently. Ongoing high lipid and cholesterol levels should be treated with medicines if diet and exercise are not working.  . If you smoke, find out from your health care provider how to quit. If you do not use tobacco, please do not start.  . If you choose to drink alcohol, please do not consume more than one drink for women and 2 for men.  One drink is considered to be 12 ounces (355 mL) of beer, 5 ounces (148 mL) of wine, or 1.5 ounces (44 mL) of liquor. Moderation of alcohol intake to this level decreases your risk of breast cancer and liver damage.   . If you are 21-36 years old, ask your health care provider if you should take aspirin to prevent strokes.  . Use sunscreen. Apply sunscreen liberally and repeatedly throughout the day. You should seek shade when your shadow is  shorter than you. Protect yourself by wearing long sleeves, pants, a wide-brimmed hat, and sunglasses year round, whenever you are outdoors.  . Once a month, do a whole body skin exam, using a mirror to look at the skin on your back. Tell your health care provider of new moles, moles that have irregular borders, moles that are larger than a pencil eraser, or moles that have changed in shape or color.  Last, if you have completed an Advanced Directive; please bring a copy and review with your physician and then we will scan to the medical record     Fall Prevention in the Home Falls can cause injuries. They can happen to people of all ages. There are many things you can do to make your home safe and to help prevent falls. What can I do on the outside of my home?  Regularly fix the edges of walkways and driveways and fix any cracks.  Remove anything that might make you trip as you walk through a door, such as a raised step or threshold.  Trim any bushes or trees on the path to your home.  Use bright outdoor lighting.  Clear any walking paths of anything  that might make someone trip, such as rocks or tools.  Regularly check to see if handrails are loose or broken. Make sure that both sides of any steps have handrails.  Any raised decks and porches should have guardrails on the edges.  Have any leaves, snow, or ice cleared regularly.  Use sand or salt on walking paths during winter.  Clean up any spills in your garage right away. This includes oil or grease spills. What can I do in the bathroom?  Use night lights.  Install grab bars by the toilet and in the tub and shower. Do not use towel bars as grab bars.  Use non-skid mats or decals in the tub or shower.  If you need to sit down in the shower, use a plastic, non-slip stool.  Keep the floor dry. Clean up any water that spills on the floor as soon as it happens.  Remove soap buildup in the tub or shower  regularly.  Attach bath mats securely with double-sided non-slip rug tape.  Do not have throw rugs and other things on the floor that can make you trip. What can I do in the bedroom?  Use night lights.  Make sure that you have a light by your bed that is easy to reach.  Do not use any sheets or blankets that are too big for your bed. They should not hang down onto the floor.  Have a firm chair that has side arms. You can use this for support while you get dressed.  Do not have throw rugs and other things on the floor that can make you trip. What can I do in the kitchen?  Clean up any spills right away.  Avoid walking on wet floors.  Keep items that you use a lot in easy-to-reach places.  If you need to reach something above you, use a strong step stool that has a grab bar.  Keep electrical cords out of the way.  Do not use floor polish or wax that makes floors slippery. If you must use wax, use non-skid floor wax.  Do not have throw rugs and other things on the floor that can make you trip. What can I do with my stairs?  Do not leave any items on the stairs.  Make sure that there are handrails on both sides of the stairs and use them. Fix handrails that are broken or loose. Make sure that handrails are as long as the stairways.  Check any carpeting to make sure that it is firmly attached to the stairs. Fix any carpet that is loose or worn.  Avoid having throw rugs at the top or bottom of the stairs. If you do have throw rugs, attach them to the floor with carpet tape.  Make sure that you have a light switch at the top of the stairs and the bottom of the stairs. If you do not have them, ask someone to add them for you. What else can I do to help prevent falls?  Wear shoes that: ? Do not have high heels. ? Have rubber bottoms. ? Are comfortable and fit you well. ? Are closed at the toe. Do not wear sandals.  If you use a stepladder: ? Make sure that it is fully  opened. Do not climb a closed stepladder. ? Make sure that both sides of the stepladder are locked into place. ? Ask someone to hold it for you, if possible.  Clearly mark and make sure that you  can see: ? Any grab bars or handrails. ? First and last steps. ? Where the edge of each step is.  Use tools that help you move around (mobility aids) if they are needed. These include: ? Canes. ? Walkers. ? Scooters. ? Crutches.  Turn on the lights when you go into a dark area. Replace any light bulbs as soon as they burn out.  Set up your furniture so you have a clear path. Avoid moving your furniture around.  If any of your floors are uneven, fix them.  If there are any pets around you, be aware of where they are.  Review your medicines with your doctor. Some medicines can make you feel dizzy. This can increase your chance of falling. Ask your doctor what other things that you can do to help prevent falls. This information is not intended to replace advice given to you by your health care provider. Make sure you discuss any questions you have with your health care provider. Document Released: 09/27/2009 Document Revised: 05/08/2016 Document Reviewed: 01/05/2015 Elsevier Interactive Patient Education  2018 [employer].       No Follow-up on file.  Colin Benton R., DO

## 2017-10-26 NOTE — Progress Notes (Addendum)
Subjective:   [name] is a 76 y.o. female who presents for Medicare Annual (Subsequent) preventive examination.  The Patient was informed that the wellness visit is to identify future health risk and educate and initiate measures that can reduce risk for increased disease through the lifespan.    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 INFLUENZA VACCINE  07/15/2017   Declines today  Educated regarding shingrix Colonoscopy 06/2017 Mammogram 09/18/2017 [facility]  Dexa 04/2015  -2.0      VS reviewed;   Diet  Breakfast; eats 20 minutes  Oatmeal with fruit; cheerios; eggs occasionally Lunch; will try to eat more vegetables Supper; cooks every day   BMI 23   Exercise At 8am does [employer]; [place] Add health to your years Comes on at 1pm Yoga; balance; aerobic 30 to 35 minutes   Dental -   Stressors: care giving is voluntary  States may be the "death" part gets to her Discussed getting some counseling for relaxation    Is a "caregiver professionally"   Sleep patterns: some nights she sleeps well   Pain - not really OA    Cardiac Risk Factors Addressed Hyperlipidemia - cho/hdl 3; hcl 71; trig 74 Pre-diabetes - A1c 5.6    Advanced Directives [email or web address] (LL)   Patient Care Team: Lucretia Kern, DO as PCP - General (Family Medicine) Paula Compton, MD as Consulting Physician (Obstetrics and Gynecology)   Cardiac Risk Factors include: advanced age (>9men, >25 women);family history of premature cardiovascular disease     Objective:     Vitals: Pulse 84   Ht 5\' 5"  (1.651 m)   Wt 138 lb (62.6 kg)   SpO2  90%   BMI 22.96 kg/m   Body mass index is 22.96 kg/m.   Tobacco Social History   Tobacco Use  Smoking Status Former Smoker  . Packs/day: 1.00  . Years: 10.00  . Pack years: 10.00  . Types: Cigarettes  . Last attempt to quit: 12/15/1988  . Years since quitting: 28.8  Smokeless Tobacco Never Used  Tobacco Comment   quit in the 1980's     Counseling given: Yes Comment: quit in the 1980's   Past Medical History:  Diagnosis Date  . Actinic keratosis 12/03/2012  . Anxiety    Effexor helps--psychiatrist Dr Toy Care  . Arthritis    osteoarthritis, hands  . Atypical chest pain    EF 86%, breast attenuation, no significant ischemia  . Cervical radiculopathy 11/18/2013  . Chronic headache   . Esophagitis 01/07/2010   Qualifier: Diagnosis of  By: Surface RN, Butch Penny    . GERD (gastroesophageal reflux disease) 11/2009   EGD: Dr. Sharlett Iles: erosive stricture dilated  . ISCHEMIC COLITIS 06/05/2008   Qualifier: Diagnosis of  By: Nils Pyle CMA ([place]), Mearl Latin    . Migraines   . 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 Osteoporosis 02/2012   t score -2.5 spine  . Palpitations   . Rotator cuff tendonitis 05/26/2011  . 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 AUGMENTATION MAMMAPLASTY    . Birth mark removed    .  CATARACT EXTRACTION Left   . COLPOSCOPY    . laser vaporization of upper vagina  1998  . NM MYOCAR PERF WALL MOTION  01/24/2008   EF 86% neg ischemia  . VAGINAL HYSTERECTOMY  1978   Family History  Problem Relation Age of Onset  . Heart disease Mother   . Heart failure Mother   . Breast cancer Mother 83  . Prostate cancer Father   . Inflammatory bowel disease Sister   . Diabetes Maternal Aunt   . Colon cancer Paternal Grandfather   . Stomach cancer Neg Hx   . Pancreatic cancer Neg Hx    Social History   Substance and Sexual Activity  Sexual Activity No  . Birth control/protection: Surgical,  Post-menopausal   Comment: HYST    Outpatient Encounter Medications as of 10/27/2017  Medication Sig  . beta carotene w/minerals (OCUVITE) tablet Take 1 tablet by mouth daily.  . cetirizine (ZYRTEC) 10 MG tablet Take 0.5 tablets (5 mg total) by mouth daily.  . Cholecalciferol (D3-1000 PO) Take by mouth.  . Fish Oil-Cholecalciferol (OMEGA-3 FISH OIL/VITAMIN D3 PO) Take by mouth.  . hyoscyamine (LEVSIN, ANASPAZ) 0.125 MG tablet Take 1 tablet (0.125 mg total) by mouth every 8 (eight) hours. As needed  . metoprolol tartrate (LOPRESSOR) 25 MG tablet Take 0.5 tablets (12.5 mg total) by mouth daily.  Marland Kitchen OVER THE COUNTER MEDICATION 4 mg.  . pantoprazole (PROTONIX) 40 MG tablet TAKE ONE TABLET BY MOUTH ONCE DAILY  . polyethylene glycol (MIRALAX / GLYCOLAX) packet Take 17 g by mouth daily.  . SUMAtriptan (IMITREX) 50 MG tablet Take 1 tablet (50 mg total) by mouth as needed. For severe headache.  . venlafaxine XR (EFFEXOR-XR) 75 MG 24 hr capsule TAKE ONE CAPSULE BY MOUTH ONCE DAILY WITH  BREAKFAST  . azelastine (ASTELIN) 0.1 % nasal spray USE TWO SPRAYS IN EACH NOSTRIL TWICE DAILY AS DIRECTED (Patient not taking: Reported on 10/27/2017)  . Bilberry, Vaccinium myrtillus, (BILBERRY PO) Take 1 capsule by mouth daily.  . fluticasone (FLONASE) 50 MCG/ACT nasal spray Place 2 sprays into both nostrils daily. (Patient not taking: Reported on 10/27/2017)  . Multiple Vitamins-Minerals (ICAPS LUTEIN & ZEAXANTHIN PO) Take by mouth.   Facility-Administered Encounter Medications as of 10/27/2017  Medication  . 0.9 %  sodium chloride infusion    Activities of Daily Living In your present state of health, do you have any difficulty performing the following activities: 10/27/2017  Hearing? N  Vision? N  Comment os complications of cataract   Difficulty concentrating or making decisions? N  Walking or climbing stairs? N  Dressing or bathing? N  Doing errands, shopping? N  Preparing Food and eating ? N  Using  the Toilet? N  In the past six months, have you accidently leaked urine? N  Do you have problems with loss of bowel control? N  Comment some constipation  Managing your Medications? N  Managing your Finances? N  Housekeeping or managing your Housekeeping? N  Some recent data might be hidden    Patient Care Team: Lucretia Kern, DO as PCP - General (Family Medicine) Paula Compton, MD as Consulting Physician (Obstetrics and Gynecology)    Assessment:     Exercise Activities and Dietary recommendations Current Exercise Habits: Structured exercise class, Time (Minutes): 30, Frequency (Times/Week): 5, Weekly Exercise (Minutes/Week): 150, Intensity: Moderate  Goals    None     Fall Risk Fall Risk  10/27/2017 12/28/2015 03/27/2014  Falls in the past year? No  No No  Comment reflexes are fast  - -   Depression Screen PHQ 2/9 Scores 10/27/2017 12/28/2015 03/27/2014  PHQ - 2 Score 0 0 1     Cognitive Function MMSE - Mini Mental State Exam 10/27/2017  Not completed: (No Data)     Ad8 score 0     Immunization History  Administered Date(s) Administered  . Influenza Split 09/10/2012  . Influenza, High Dose Seasonal PF 12/28/2015, 10/06/2016  . Influenza,inj,Quad PF,6+ Mos 10/02/2014  . Pneumococcal Conjugate-13 10/02/2014  . Pneumococcal Polysaccharide-23 05/27/2005, 10/06/2016  . Td 05/28/2004, 07/31/2008   Screening Tests Health Maintenance  Topic Date Due  . INFLUENZA VACCINE  07/15/2017  . TETANUS/TDAP  07/31/2018  . MAMMOGRAM  09/18/2018  . COLONOSCOPY  06/27/2027  . DEXA SCAN  Completed  . PNA vac Low Risk Adult  Completed      Plan:     PCP Notes   Health Maintenance Will send EMMI on Advanced Directive   States she is feeling stressed; discussed relaxation and counseling as needed; self referral to [facility] ; caregiver   Declines flu  Discussed shingrix  Educated on calcium for osteopenia  Educated on nasal sprays during allergy season  Dx with MD  early; is taking meds as directed     Abnormal Screens  BP was elevated but will recheck States she was nervous as she was not sure why she was here Educated on medicare wellness  Referrals  None  Patient concerns; Left eye still feels blurred after cataract. Will see Dr. Katy Fitch again in December to fup  Nurse Concerns;  as noted   Next PCP apt Today   I have personally reviewed and noted the following in the patient's chart:   . Medical and social history . Use of alcohol, tobacco or illicit drugs  . Current medications and supplements . Functional ability and status . Nutritional status . Physical activity . Advanced directives . List of other physicians . Hospitalizations, surgeries, and ER visits in previous 12 months . Vitals . Screenings to include cognitive, depression, and falls . Referrals and appointments  In addition, I have reviewed and discussed with patient certain preventive protocols, quality metrics, and best practice recommendations. A written personalized care plan for preventive services as well as general preventive health recommendations were provided to patient.     Wynetta Fines, RN  10/27/2017   11/13  Call to Dr. Nena Alexander office; Dexa ordered by Dr. Shawna Orleans and ok for Dr. Maudie Mercury to fup. Order placed for Dexa at the [place] with the patient's verbal agreement. [place] RN

## 2017-10-27 ENCOUNTER — Other Ambulatory Visit: Payer: Self-pay

## 2017-10-27 ENCOUNTER — Other Ambulatory Visit: Payer: Self-pay | Admitting: *Deleted

## 2017-10-27 ENCOUNTER — Encounter: Payer: Self-pay | Admitting: Family Medicine

## 2017-10-27 ENCOUNTER — Ambulatory Visit (INDEPENDENT_AMBULATORY_CARE_PROVIDER_SITE_OTHER): Payer: PPO | Admitting: Family Medicine

## 2017-10-27 VITALS — BP 140/70 | HR 61 | Ht 65.0 in | Wt 138.0 lb

## 2017-10-27 DIAGNOSIS — Z0001 Encounter for general adult medical examination with abnormal findings: Secondary | ICD-10-CM | POA: Diagnosis not present

## 2017-10-27 DIAGNOSIS — R030 Elevated blood-pressure reading, without diagnosis of hypertension: Secondary | ICD-10-CM

## 2017-10-27 DIAGNOSIS — E2839 Other primary ovarian failure: Secondary | ICD-10-CM

## 2017-10-27 DIAGNOSIS — F411 Generalized anxiety disorder: Secondary | ICD-10-CM

## 2017-10-27 DIAGNOSIS — E785 Hyperlipidemia, unspecified: Secondary | ICD-10-CM

## 2017-10-27 DIAGNOSIS — R002 Palpitations: Secondary | ICD-10-CM

## 2017-10-27 DIAGNOSIS — Z0000 Encounter for general adult medical examination without abnormal findings: Secondary | ICD-10-CM

## 2017-10-27 LAB — BASIC METABOLIC PANEL
BUN: 11 mg/dL (ref 6–23)
CALCIUM: 9.6 mg/dL (ref 8.4–10.5)
CHLORIDE: 101 meq/L (ref 96–112)
CO2: 28 meq/L (ref 19–32)
CREATININE: 0.62 mg/dL (ref 0.40–1.20)
GFR: 99.55 mL/min (ref >60.00)
Glucose, Bld: 108 mg/dL — ABNORMAL HIGH (ref 70–99)
Potassium: 4.7 mEq/L (ref 3.5–5.1)
Sodium: 137 mEq/L (ref 135–145)

## 2017-10-27 LAB — LIPID PANEL
CHOLESTEROL: 273 mg/dL — AB (ref 0–200)
HDL: 83.9 mg/dL (ref >39.00)
LDL Cholesterol: 172 mg/dL — ABNORMAL HIGH (ref 0–99)
NONHDL: 189.49
Total CHOL/HDL Ratio: 3
Triglycerides: 88 mg/dL (ref 0.0–149.0)
VLDL: 17.6 mg/dL (ref 0.0–40.0)

## 2017-10-27 LAB — CBC
HCT: 42.4 % (ref 36.0–46.0)
Hemoglobin: 14.2 g/dL (ref 12.0–15.0)
MCHC: 33.6 g/dL (ref 30.0–36.0)
MCV: 96.7 fl (ref 78.0–100.0)
Platelets: 213 10*3/uL (ref 150.0–400.0)
RBC: 4.39 Mil/uL (ref 3.87–5.11)
RDW: 12.7 % (ref 11.5–15.5)
WBC: 4.3 10*3/uL (ref 4.0–10.5)

## 2017-10-27 LAB — HEMOGLOBIN A1C: HEMOGLOBIN A1C: 5.5 % (ref 4.6–6.5)

## 2017-10-27 MED ORDER — VENLAFAXINE HCL ER 75 MG PO CP24: ORAL_CAPSULE | ORAL | 3 refills | Status: AC

## 2017-10-27 NOTE — Telephone Encounter (Signed)
Rx done. 

## 2017-10-27 NOTE — Patient Instructions (Addendum)
BEFORE YOU LEAVE: -follow up: 1 months -labs  We will plan to add a blood pressure medication once we check the labs. Take once daily.    [name] , Thank you for taking time to come for your Medicare Wellness Visit. I appreciate your ongoing commitment to your health goals. Please review the following plan we discussed and let me know if I can assist you in the future.   Keep in mind the flu shot is an inactivated vaccine and takes at least 2 weeks to build immunity. The flu virus can be dormant for 4 days prior to symptoms Taking the flu shot at the beginning of the season can reduce the risk for the entire community.   Shingrix is a vaccine for the prevention of Shingles in Adults 50 and older.  If you are on Medicare, you can request a prescription from your doctor to be filled at a pharmacy.  Please check with your benefits regarding applicable copays or out of pocket expenses.  The Shingrix is given in 2 vaccines approx 8 weeks apart. You must receive the 2nd dose prior to 6 months from receipt of the first.   Recommendations for Dexa Scan Female over the age of 67 Man age 80 or older If you broke a bone past the age of 54 Women menopausal age with risk factors (thin frame; smoker; hx of fx ) Post menopausal women under the age of 58 with risk factors A man age 22 to 85 with risk factors Other: Spine xray that is showing break of bone loss Back pain with possible break Height loss of 1/2 inch or more within one year Total loss in height of 1.5 inches from your original height  Calcium 1200mg  with Vit D 800u per day; more as directed by physician Strength building exercises discussed; can include walking; housework; small weights or stretch bands; silver sneakers if access to the Y  Please visit the osteoporosis foundation.org for up to date recommendations  Will send you information on Advanced Directives via email ( EMMI) Will try to complete AD; Given copy  Referred  to [facility] for questions [place] offers free advance directive forms, as well as assistance in completing the forms themselves. For assistance, contact the Spiritual Care Department at [phone], or the Clinical Social Work Department at [phone].   These are the goals we discussed:will relax more; see note  Goals    None      This is a list of the screening recommended for you and due dates:  Health Maintenance  Topic Date Due  . Flu Shot  07/15/2017  . Tetanus Vaccine  07/31/2018  . Mammogram  09/18/2018  . Colon Cancer Screening  06/27/2027  . DEXA scan (bone density measurement)  Completed  . Pneumonia vaccines  Completed   Prevention of falls: Remove rugs or any tripping hazards in the home Use Non slip mats in bathtubs and showers Placing grab bars next to the toilet and or shower Placing handrails on both sides of the stair way Adding extra lighting in the home.   Personal safety issues reviewed:  1. Consider starting a community watch program per [facility] 2.  Changes batteries is smoke detector and/or carbon monoxide detector  3.  If you have firearms; keep them in a safe place 4.  Wear protection when in the sun; Always wear sunscreen or a hat; It is good to have your doctor check your skin annually or review any new  areas of concern 5. Driving safety; Keep in the right lane; stay 3 car lengths behind the car in front of you on the highway; look 3 times prior to pulling out; carry your cell phone everywhere you go!    Learn about the Yellow Dot program:  The program allows first responders at your emergency to have access to who your physician is, as well as your medications and medical conditions.  Citizens requesting the Yellow Dot Packages should contact Master Corporal Nunzio Cobbs at the [facility] [phone] for the first week of the program and beginning the week after Easter citizens should contact their Occupational hygienist.  Summary: Preventive Care for Adults  A healthy lifestyle and preventive care can promote health and wellness. Preventive health guidelines for adults include the following key practices.  . A routine yearly physical is a good way to check with your health care provider about your health and preventive screening. It is a chance to share any concerns and updates on your health and to receive a thorough exam.  . Visit your dentist for a routine exam and preventive care every 6 months. Brush your teeth twice a day and floss once a day. Good oral hygiene prevents tooth decay and gum disease.  . The frequency of eye exams is based on your age, health, family medical history, use  of contact lenses, and other factors. Follow your health care provider's ecommendations for frequency of eye exams.  . Eat a healthy diet. Foods like vegetables, fruits, whole grains, low-fat dairy products, and lean protein foods contain the nutrients you need without too many calories. Decrease your intake of foods high in solid fats, added sugars, and salt. Eat the right amount of calories for you. Get information about a proper diet from your health care provider, if necessary.  . Regular physical exercise is one of the most important things you can do for your health. Most adults should get at least 150 minutes of moderate-intensity exercise (any activity that increases your heart rate and causes you to sweat) each week. In addition, most adults need muscle-strengthening exercises on 2 or more days a week.  Silver Sneakers may be a benefit available to you. To determine eligibility, you may visit the website: www.silversneakers.com or contact program at (713)833-7372 Mon-Fri between 8AM-8PM.   . Maintain a healthy weight. The body mass index (BMI) is a screening tool to identify possible weight problems. It provides an estimate of body fat based on height and weight. Your health care provider  can find your BMI and can help you achieve or maintain a healthy weight.   For adults 20 years and older: ? A BMI below 18.5 is considered underweight. ? A BMI of 18.5 to 24.9 is normal. ? A BMI of 27 to 28 is considered normal by the Institutes of Health  ? A BMI of 30 and above is considered obese.   . Maintain normal blood lipids and cholesterol levels by exercising and minimizing your intake of saturated fat. Eat a balanced diet with plenty of fruit and vegetables. Blood tests for lipids and cholesterol should begin at age 56 and be repeated every 5 years. If your lipid or cholesterol levels are high, you are over 50, or you are at high risk for heart disease, you may need your cholesterol levels checked more frequently. Ongoing high lipid and cholesterol levels should be treated with medicines if diet and exercise are not working.  Marland Kitchen  If you smoke, find out from your health care provider how to quit. If you do not use tobacco, please do not start.  . If you choose to drink alcohol, please do not consume more than one drink for women and 2 for men.  One drink is considered to be 12 ounces (355 mL) of beer, 5 ounces (148 mL) of wine, or 1.5 ounces (44 mL) of liquor. Moderation of alcohol intake to this level decreases your risk of breast cancer and liver damage.   . If you are 68-39 years old, ask your health care provider if you should take aspirin to prevent strokes.  . Use sunscreen. Apply sunscreen liberally and repeatedly throughout the day. You should seek shade when your shadow is shorter than you. Protect yourself by wearing long sleeves, pants, a wide-brimmed hat, and sunglasses year round, whenever you are outdoors.  . Once a month, do a whole body skin exam, using a mirror to look at the skin on your back. Tell your health care provider of new moles, moles that have irregular borders, moles that are larger than a pencil eraser, or moles that have changed in shape or  color.  Last, if you have completed an Advanced Directive; please bring a copy and review with your physician and then we will scan to the medical record     Fall Prevention in the Home Falls can cause injuries. They can happen to people of all ages. There are many things you can do to make your home safe and to help prevent falls. What can I do on the outside of my home?  Regularly fix the edges of walkways and driveways and fix any cracks.  Remove anything that might make you trip as you walk through a door, such as a raised step or threshold.  Trim any bushes or trees on the path to your home.  Use bright outdoor lighting.  Clear any walking paths of anything that might make someone trip, such as rocks or tools.  Regularly check to see if handrails are loose or broken. Make sure that both sides of any steps have handrails.  Any raised decks and porches should have guardrails on the edges.  Have any leaves, snow, or ice cleared regularly.  Use sand or salt on walking paths during winter.  Clean up any spills in your garage right away. This includes oil or grease spills. What can I do in the bathroom?  Use night lights.  Install grab bars by the toilet and in the tub and shower. Do not use towel bars as grab bars.  Use non-skid mats or decals in the tub or shower.  If you need to sit down in the shower, use a plastic, non-slip stool.  Keep the floor dry. Clean up any water that spills on the floor as soon as it happens.  Remove soap buildup in the tub or shower regularly.  Attach bath mats securely with double-sided non-slip rug tape.  Do not have throw rugs and other things on the floor that can make you trip. What can I do in the bedroom?  Use night lights.  Make sure that you have a light by your bed that is easy to reach.  Do not use any sheets or blankets that are too big for your bed. They should not hang down onto the floor.  Have a firm chair that has  side arms. You can use this for support while you get dressed.  Do  not have throw rugs and other things on the floor that can make you trip. What can I do in the kitchen?  Clean up any spills right away.  Avoid walking on wet floors.  Keep items that you use a lot in easy-to-reach places.  If you need to reach something above you, use a strong step stool that has a grab bar.  Keep electrical cords out of the way.  Do not use floor polish or wax that makes floors slippery. If you must use wax, use non-skid floor wax.  Do not have throw rugs and other things on the floor that can make you trip. What can I do with my stairs?  Do not leave any items on the stairs.  Make sure that there are handrails on both sides of the stairs and use them. Fix handrails that are broken or loose. Make sure that handrails are as long as the stairways.  Check any carpeting to make sure that it is firmly attached to the stairs. Fix any carpet that is loose or worn.  Avoid having throw rugs at the top or bottom of the stairs. If you do have throw rugs, attach them to the floor with carpet tape.  Make sure that you have a light switch at the top of the stairs and the bottom of the stairs. If you do not have them, ask someone to add them for you. What else can I do to help prevent falls?  Wear shoes that: ? Do not have high heels. ? Have rubber bottoms. ? Are comfortable and fit you well. ? Are closed at the toe. Do not wear sandals.  If you use a stepladder: ? Make sure that it is fully opened. Do not climb a closed stepladder. ? Make sure that both sides of the stepladder are locked into place. ? Ask someone to hold it for you, if possible.  Clearly mark and make sure that you can see: ? Any grab bars or handrails. ? First and last steps. ? Where the edge of each step is.  Use tools that help you move around (mobility aids) if they are needed. These  include: ? Canes. ? Walkers. ? Scooters. ? Crutches.  Turn on the lights when you go into a dark area. Replace any light bulbs as soon as they burn out.  Set up your furniture so you have a clear path. Avoid moving your furniture around.  If any of your floors are uneven, fix them.  If there are any pets around you, be aware of where they are.  Review your medicines with your doctor. Some medicines can make you feel dizzy. This can increase your chance of falling. Ask your doctor what other things that you can do to help prevent falls. This information is not intended to replace advice given to you by your health care provider. Make sure you discuss any questions you have with your health care provider. Document Released: 09/27/2009 Document Revised: 05/08/2016 Document Reviewed: 01/05/2015 Elsevier Interactive Patient Education  [employer].

## 2017-10-27 NOTE — Progress Notes (Unsigned)
Call to Dr. Nena Alexander office. They seen [name] 10/2016 and agreed to Dr. Maudie Mercury following Dexa Call the patient and agreed to dexa at the [place] placed and the Breast center will call to set up an apt but if they do not, she agreed to call them   [facility] RN

## 2017-10-29 ENCOUNTER — Telehealth: Payer: Self-pay | Admitting: *Deleted

## 2017-10-29 NOTE — Telephone Encounter (Signed)
Copied from [place] [id number]. Topic: Quick Communication - Lab Results >> Oct 29, 2017 11:15 AM Carolyn Stare wrote:   Pt req call back about labs

## 2017-10-30 ENCOUNTER — Telehealth: Payer: Self-pay | Admitting: Family Medicine

## 2017-10-30 DIAGNOSIS — E7800 Pure hypercholesterolemia, unspecified: Secondary | ICD-10-CM

## 2017-10-30 NOTE — Telephone Encounter (Signed)
-----   Message from Agnes Lawrence, [place] sent at 10/30/2017  5:07 PM EST ----- I left a message for the pt to return my call.

## 2017-10-30 NOTE — Telephone Encounter (Signed)
Pt would like her result release to mychart or come by the office to pick up a copy

## 2017-10-30 NOTE — Telephone Encounter (Signed)
She is willing to start cholesterol med.  Pt did state her migraines have returned states her Metoprolol was cut in half and her migraines are back  I have scheduled her for fasting blood work 11/20/17 cholesterol recheck

## 2017-11-02 MED ORDER — LOSARTAN POTASSIUM 50 MG PO TABS
50.0000 mg | ORAL_TABLET | Freq: Every day | ORAL | 3 refills | Status: DC
Start: 2017-11-02 — End: 2017-12-04

## 2017-11-02 MED ORDER — ROSUVASTATIN CALCIUM 5 MG PO TABS: 5.0000 mg | ORAL_TABLET | Freq: Every day | ORAL | 3 refills | Status: DC

## 2017-11-02 NOTE — Telephone Encounter (Signed)
Please call patient.  Okay.  Please start Crestor 5 mg daily for the cholesterol.  Let us try adding losartan 50 mg daily for the blood pressure and the headaches.  If this does not work for the headaches we may need to try increasing the metoprolol again. Please send both medications above, #30 with 3 refills.  Ensure she has a follow-up in 1 month.  Advised that she follow-up sooner if not feeling well.

## 2017-11-02 NOTE — Telephone Encounter (Signed)
I called the pt and informed her of the message below and she is aware both Rxs were sent to her pharmacy.  Patient stated she scheduled a follow up at her last appt.

## 2017-11-03 ENCOUNTER — Encounter: Payer: Self-pay | Admitting: Family Medicine

## 2017-11-03 NOTE — Telephone Encounter (Signed)
I called the pt and informed her of the message below

## 2017-11-03 NOTE — Telephone Encounter (Signed)
Copied from [place] [phone]. Topic: Inquiry >> Nov 02, 2017 10:21 AM Scherrie Gerlach wrote: Reason for CRM: pt would like Mechele Claude to call her back concerning the Crestor being called in.  Pt states she just spoke with Mechele Claude, and wants something changed

## 2017-11-03 NOTE — Telephone Encounter (Signed)
I called the pt and she wanted to know if she should take both the Crestor and Losartan at night?  She also wanted to know why she was given Crestor as this is such a strong drug and she does not want to develop muscle aches as some of the people she knows that takes this has as well?  Message sent to Dr Maudie Mercury.

## 2017-11-03 NOTE — Telephone Encounter (Signed)
Losartan should be taken in the morning. This is a low dose of crestor and it actually tends to be tolerated better in terms of leg cramps.

## 2017-11-12 ENCOUNTER — Encounter: Payer: Self-pay | Admitting: Family Medicine

## 2017-11-20 ENCOUNTER — Other Ambulatory Visit: Payer: PPO

## 2017-11-24 ENCOUNTER — Other Ambulatory Visit: Payer: PPO

## 2017-11-26 ENCOUNTER — Other Ambulatory Visit: Payer: Self-pay | Admitting: Family Medicine

## 2017-11-27 ENCOUNTER — Ambulatory Visit: Payer: PPO | Admitting: Family Medicine

## 2017-12-01 ENCOUNTER — Other Ambulatory Visit (INDEPENDENT_AMBULATORY_CARE_PROVIDER_SITE_OTHER): Payer: PPO

## 2017-12-01 DIAGNOSIS — E7800 Pure hypercholesterolemia, unspecified: Secondary | ICD-10-CM | POA: Diagnosis not present

## 2017-12-01 LAB — LIPID PANEL
CHOLESTEROL: 159 mg/dL (ref 0–200)
HDL: 84.5 mg/dL (ref >39.00)
LDL Cholesterol: 57 mg/dL (ref 0–99)
NonHDL: 74.19
TRIGLYCERIDES: 88 mg/dL (ref 0.0–149.0)
Total CHOL/HDL Ratio: 2
VLDL: 17.6 mg/dL (ref 0.0–40.0)

## 2017-12-02 ENCOUNTER — Ambulatory Visit
Admission: RE | Admit: 2017-12-02 | Discharge: 2017-12-02 | Disposition: A | Discharge status: Home / Self Care | Payer: PPO | Source: Ambulatory Visit | Attending: Family Medicine | Admitting: Family Medicine

## 2017-12-02 DIAGNOSIS — Z780 Asymptomatic menopausal state: Secondary | ICD-10-CM | POA: Diagnosis not present

## 2017-12-02 DIAGNOSIS — E2839 Other primary ovarian failure: Secondary | ICD-10-CM

## 2017-12-02 DIAGNOSIS — M8589 Other specified disorders of bone density and structure, multiple sites: Secondary | ICD-10-CM | POA: Diagnosis not present

## 2017-12-04 ENCOUNTER — Encounter: Payer: Self-pay | Admitting: Family Medicine

## 2017-12-04 ENCOUNTER — Ambulatory Visit: Payer: PPO | Admitting: Family Medicine

## 2017-12-04 VITALS — BP 132/60 | HR 75 | Temp 97.6°F | Ht 65.0 in | Wt 141.5 lb

## 2017-12-04 DIAGNOSIS — G43909 Migraine, unspecified, not intractable, without status migrainosus: Secondary | ICD-10-CM | POA: Insufficient documentation

## 2017-12-04 DIAGNOSIS — I10 Essential (primary) hypertension: Secondary | ICD-10-CM | POA: Diagnosis not present

## 2017-12-04 DIAGNOSIS — G43809 Other migraine, not intractable, without status migrainosus: Secondary | ICD-10-CM

## 2017-12-04 DIAGNOSIS — R000 Tachycardia, unspecified: Secondary | ICD-10-CM | POA: Diagnosis not present

## 2017-12-04 MED ORDER — METOPROLOL TARTRATE 25 MG PO TABS: 25.0000 mg | ORAL_TABLET | Freq: Two times a day (BID) | ORAL | 3 refills | Status: DC

## 2017-12-04 NOTE — Progress Notes (Signed)
HPI:  Follow-up hypertension and hyperlipidemia: -Started losartan and Crestor last visit -Also advised lifestyle changes -She also was having some headaches -Today reports: doing fine, she wants to go back to metop 25 bid, she doesn't want to take two bp meds and thinks symptoms and elevated bp were a fluke - reports she did fine on metop for 25 years, started for tachy by cardiologist -she is happy about the crestor - tolerating well -Denies:cp, sob, ha, fevers, malaise  ROS: See pertinent positives and negatives per HPI.  Past Medical History:  Diagnosis Date  . Actinic keratosis 12/03/2012  . Anxiety    Effexor helps--psychiatrist Dr Toy Care  . Arthritis    osteoarthritis, hands  . Atypical chest pain    EF 86%, breast attenuation, no significant ischemia  . Cervical radiculopathy 11/18/2013  . Chronic headache   . Esophagitis 01/07/2010   Qualifier: Diagnosis of  By: Surface RN, Butch Penny    . GERD (gastroesophageal reflux disease) 11/2009   EGD: Dr. Sharlett Iles: erosive stricture dilated  . ISCHEMIC COLITIS 06/05/2008   Qualifier: Diagnosis of  By: Nils Pyle CMA ([place]), Mearl Latin    . Migraines   . 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 Osteoporosis 02/2012   t score -2.5 spine  . Palpitations   . Rotator cuff tendonitis 05/26/2011  . 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 AUGMENTATION MAMMAPLASTY    . Birth mark removed    . CATARACT EXTRACTION Left   . COLPOSCOPY    . laser vaporization of upper vagina  1998  . NM MYOCAR PERF WALL MOTION  01/24/2008   EF 86% neg ischemia  . VAGINAL HYSTERECTOMY  1978    Family History  Problem Relation Age of Onset  . Heart disease Mother   . Heart failure Mother   . Breast cancer Mother 43  . Prostate cancer Father   . Inflammatory bowel disease Sister   . Diabetes Maternal Aunt   . Colon cancer Paternal Grandfather   . Stomach cancer Neg Hx   .  Pancreatic cancer Neg Hx     Social History   Socioeconomic History  . Marital status: Married    Spouse name: None  . Number of children: None  . Years of education: None  . Highest education level: None  Social Needs  . Financial resource strain: None  . Food insecurity - worry: None  . Food insecurity - inability: None  . Transportation needs - medical: None  . Transportation needs - non-medical: None  Occupational History  . None  Tobacco Use  . Smoking status: Former Smoker    Packs/day: 1.00    Years: 10.00    Pack years: 10.00    Types: Cigarettes    Last attempt to quit: 12/15/1988    Years since quitting: 28.9  . Smokeless tobacco: Never Used  . Tobacco comment: quit in the 1980's  Substance and Sexual Activity  . Alcohol use: Yes    Alcohol/week: 7.0 oz    Types: 14 Standard drinks or equivalent per week    Comment: drinks wine every day; 2 drinks a day   . Drug use: No  . Sexual activity: No    Birth control/protection: Surgical, Post-menopausal    Comment: HYST  Other Topics Concern  . None  Social History Narrative   Married, mother of one.  Lives with spouse and 4 dogs.   Very active.  Regular exercise  -- yes.  Quit smoking in 1990.     She monitors her diet well.     1-2 glasses of wine almost every night.     Current Outpatient Medications:  .  azelastine (ASTELIN) 0.1 % nasal spray, USE TWO SPRAYS IN EACH NOSTRIL TWICE DAILY AS DIRECTED, Disp: 30 mL, Rfl: 5 .  beta carotene w/minerals (OCUVITE) tablet, Take 1 tablet by mouth daily., Disp: , Rfl:  .  Bilberry, Vaccinium myrtillus, (BILBERRY PO), Take 1 capsule by mouth daily., Disp: , Rfl:  .  cetirizine (ZYRTEC) 10 MG tablet, Take 0.5 tablets (5 mg total) by mouth daily., Disp: 30 tablet, Rfl: 11 .  Cholecalciferol (D3-1000 PO), Take by mouth., Disp: , Rfl:  .  Fish Oil-Cholecalciferol (OMEGA-3 FISH OIL/VITAMIN D3 PO), Take by mouth., Disp: , Rfl:  .  fluticasone (FLONASE) 50 MCG/ACT nasal spray,  Place 2 sprays into both nostrils daily., Disp: 16 g, Rfl: 6 .  hyoscyamine (LEVSIN, ANASPAZ) 0.125 MG tablet, Take 1 tablet (0.125 mg total) by mouth every 8 (eight) hours. As needed, Disp: 30 tablet, Rfl: 0 .  metoprolol tartrate (LOPRESSOR) 25 MG tablet, Take 1 tablet (25 mg total) by mouth 2 (two) times daily., Disp: 180 tablet, Rfl: 3 .  Multiple Vitamins-Minerals (ICAPS LUTEIN & ZEAXANTHIN PO), Take by mouth., Disp: , Rfl:  .  OVER THE COUNTER MEDICATION, 4 mg., Disp: , Rfl:  .  pantoprazole (PROTONIX) 40 MG tablet, TAKE 1 TABLET BY MOUTH ONCE DAILY, Disp: 90 tablet, Rfl: 0 .  polyethylene glycol (MIRALAX / GLYCOLAX) packet, Take 17 g by mouth daily., Disp: 14 each, Rfl: 0 .  rosuvastatin (CRESTOR) 5 MG tablet, Take 1 tablet (5 mg total) daily by mouth., Disp: 30 tablet, Rfl: 3 .  SUMAtriptan (IMITREX) 50 MG tablet, Take 1 tablet (50 mg total) by mouth as needed. For severe headache., Disp: 10 tablet, Rfl: 3 .  venlafaxine XR (EFFEXOR-XR) 75 MG 24 hr capsule, TAKE ONE CAPSULE BY MOUTH ONCE DAILY WITH  BREAKFAST, Disp: 90 capsule, Rfl: 3  Current Facility-Administered Medications:  .  0.9 %  sodium chloride infusion, 500 mL, Intravenous, Continuous, Milus Banister, MD  EXAM:  Vitals:   12/04/17 1428  BP: 132/60  Pulse: 75  Temp: 97.6 F (36.4 C)    Body mass index is 23.55 kg/m.  GENERAL: vitals reviewed and listed above, alert, oriented, appears well hydrated and in no acute distress  HEENT: atraumatic, conjunttiva clear, no obvious abnormalities on inspection of external nose and ears  NECK: no obvious masses on inspection  LUNGS: clear to auscultation bilaterally, no wheezes, rales or rhonchi, good air movement  CV: HRRR, no peripheral edema  MS: moves all extremities without noticeable abnormality  PSYCH: pleasant and cooperative, no obvious depression or anxiety  ASSESSMENT AND PLAN:  Discussed the following assessment and plan:  Essential  hypertension  Other migraine without status migrainosus, not intractable  Tachycardia  -Discussed the uses of each of her medications, treatment options for her conditions -She really wants to go back to taking her beta-blocker twice daily instead of being on 2 blood pressure medications -We will stop her losartan, and try the metoprolol 25 mg twice daily -Glad she is tolerating the Crestor well and did so well in terms of her cholesterol results -discussed risk and benefits and she does want to continue to take this -Lifestyle recommendations -Did advise follow-up in 1 month, but she wants to check her blood pressure at  home follow-up only if it is running high, she reports she watches it closely and it tends to run 120/70 or below  -Follow-up 3 months -Patient advised to return or notify a doctor immediately if symptoms worsen or persist or new concerns arise.  Patient Instructions  BEFORE YOU LEAVE: -Follow-up in 3 months  Stop the losartan.  Take metoprolol 25 mg twice daily.  Do not ever stop this medication suddenly.  This medication is sometimes prescribed  for blood pressure, elevated heart rate and headaches.  Check your blood pressure on a regular basis.  The goal is 120/70 or below, if your blood pressure is running high, please follow-up promptly.  If you have worsening headaches or any other concerns, please follow-up sooner.        Colin Benton R., DO

## 2017-12-04 NOTE — Patient Instructions (Signed)
BEFORE YOU LEAVE: -Follow-up in 3 months  Stop the losartan.  Take metoprolol 25 mg twice daily.  Do not ever stop this medication suddenly.  This medication is sometimes prescribed  for blood pressure, elevated heart rate and headaches.  Check your blood pressure on a regular basis.  The goal is 120/70 or below, if your blood pressure is running high, please follow-up promptly.  If you have worsening headaches or any other concerns, please follow-up sooner.

## 2017-12-10 DIAGNOSIS — G43009 Migraine without aura, not intractable, without status migrainosus: Secondary | ICD-10-CM | POA: Diagnosis not present

## 2017-12-10 DIAGNOSIS — K219 Gastro-esophageal reflux disease without esophagitis: Secondary | ICD-10-CM | POA: Diagnosis not present

## 2017-12-10 DIAGNOSIS — I10 Essential (primary) hypertension: Secondary | ICD-10-CM | POA: Diagnosis not present

## 2017-12-10 DIAGNOSIS — Z8719 Personal history of other diseases of the digestive system: Secondary | ICD-10-CM | POA: Diagnosis not present

## 2017-12-10 DIAGNOSIS — F411 Generalized anxiety disorder: Secondary | ICD-10-CM | POA: Diagnosis not present

## 2017-12-10 DIAGNOSIS — E782 Mixed hyperlipidemia: Secondary | ICD-10-CM | POA: Diagnosis not present

## 2017-12-23 DIAGNOSIS — H26492 Other secondary cataract, left eye: Secondary | ICD-10-CM | POA: Diagnosis not present

## 2017-12-23 DIAGNOSIS — H04123 Dry eye syndrome of bilateral lacrimal glands: Secondary | ICD-10-CM | POA: Diagnosis not present

## 2017-12-23 DIAGNOSIS — H2511 Age-related nuclear cataract, right eye: Secondary | ICD-10-CM | POA: Diagnosis not present

## 2017-12-23 DIAGNOSIS — Z961 Presence of intraocular lens: Secondary | ICD-10-CM | POA: Diagnosis not present

## 2017-12-23 DIAGNOSIS — H353131 Nonexudative age-related macular degeneration, bilateral, early dry stage: Secondary | ICD-10-CM | POA: Diagnosis not present

## 2017-12-24 ENCOUNTER — Encounter: Payer: Self-pay | Admitting: Family Medicine

## 2018-01-01 DIAGNOSIS — Z1389 Encounter for screening for other disorder: Secondary | ICD-10-CM | POA: Diagnosis not present

## 2018-01-01 DIAGNOSIS — N891 Moderate vaginal dysplasia: Secondary | ICD-10-CM | POA: Diagnosis not present

## 2018-01-01 DIAGNOSIS — Z124 Encounter for screening for malignant neoplasm of cervix: Secondary | ICD-10-CM | POA: Diagnosis not present

## 2018-01-01 DIAGNOSIS — Z01419 Encounter for gynecological examination (general) (routine) without abnormal findings: Secondary | ICD-10-CM | POA: Diagnosis not present

## 2018-01-22 ENCOUNTER — Other Ambulatory Visit: Payer: Self-pay | Admitting: Family Medicine

## 2018-02-23 DIAGNOSIS — R002 Palpitations: Secondary | ICD-10-CM | POA: Diagnosis not present

## 2018-02-23 DIAGNOSIS — I10 Essential (primary) hypertension: Secondary | ICD-10-CM | POA: Diagnosis not present

## 2018-02-23 DIAGNOSIS — K219 Gastro-esophageal reflux disease without esophagitis: Secondary | ICD-10-CM | POA: Diagnosis not present

## 2018-03-18 DIAGNOSIS — H1131 Conjunctival hemorrhage, right eye: Secondary | ICD-10-CM | POA: Diagnosis not present

## 2018-04-05 DIAGNOSIS — K219 Gastro-esophageal reflux disease without esophagitis: Secondary | ICD-10-CM | POA: Diagnosis not present

## 2018-06-01 DIAGNOSIS — L821 Other seborrheic keratosis: Secondary | ICD-10-CM | POA: Diagnosis not present

## 2018-06-01 DIAGNOSIS — D225 Melanocytic nevi of trunk: Secondary | ICD-10-CM | POA: Diagnosis not present

## 2018-06-01 DIAGNOSIS — D1801 Hemangioma of skin and subcutaneous tissue: Secondary | ICD-10-CM | POA: Diagnosis not present

## 2018-06-01 DIAGNOSIS — L814 Other melanin hyperpigmentation: Secondary | ICD-10-CM | POA: Diagnosis not present

## 2018-06-01 DIAGNOSIS — L723 Sebaceous cyst: Secondary | ICD-10-CM | POA: Diagnosis not present

## 2018-06-21 DIAGNOSIS — F411 Generalized anxiety disorder: Secondary | ICD-10-CM | POA: Diagnosis not present

## 2018-06-21 DIAGNOSIS — K219 Gastro-esophageal reflux disease without esophagitis: Secondary | ICD-10-CM | POA: Diagnosis not present

## 2018-06-21 DIAGNOSIS — Z23 Encounter for immunization: Secondary | ICD-10-CM | POA: Diagnosis not present

## 2018-06-21 DIAGNOSIS — E782 Mixed hyperlipidemia: Secondary | ICD-10-CM | POA: Diagnosis not present

## 2018-06-21 DIAGNOSIS — I10 Essential (primary) hypertension: Secondary | ICD-10-CM | POA: Diagnosis not present

## 2018-06-21 DIAGNOSIS — Z0000 Encounter for general adult medical examination without abnormal findings: Secondary | ICD-10-CM | POA: Diagnosis not present

## 2018-06-21 DIAGNOSIS — Z1331 Encounter for screening for depression: Secondary | ICD-10-CM | POA: Diagnosis not present

## 2018-06-23 DIAGNOSIS — L03114 Cellulitis of left upper limb: Secondary | ICD-10-CM | POA: Diagnosis not present

## 2018-06-25 DIAGNOSIS — L03114 Cellulitis of left upper limb: Secondary | ICD-10-CM | POA: Diagnosis not present

## 2018-07-02 DIAGNOSIS — R5382 Chronic fatigue, unspecified: Secondary | ICD-10-CM | POA: Diagnosis not present

## 2018-07-02 DIAGNOSIS — M542 Cervicalgia: Secondary | ICD-10-CM | POA: Diagnosis not present

## 2018-08-06 ENCOUNTER — Other Ambulatory Visit: Payer: Self-pay | Admitting: Obstetrics and Gynecology

## 2018-08-06 ENCOUNTER — Other Ambulatory Visit: Payer: Self-pay | Admitting: Family Medicine

## 2018-08-06 DIAGNOSIS — Z1231 Encounter for screening mammogram for malignant neoplasm of breast: Secondary | ICD-10-CM

## 2018-08-11 ENCOUNTER — Other Ambulatory Visit: Payer: Self-pay | Admitting: Obstetrics and Gynecology

## 2018-08-11 DIAGNOSIS — N6459 Other signs and symptoms in breast: Secondary | ICD-10-CM

## 2018-08-11 DIAGNOSIS — Z872 Personal history of diseases of the skin and subcutaneous tissue: Secondary | ICD-10-CM

## 2018-08-11 DIAGNOSIS — R234 Changes in skin texture: Secondary | ICD-10-CM

## 2018-08-17 ENCOUNTER — Ambulatory Visit
Admission: RE | Admit: 2018-08-17 | Discharge: 2018-08-17 | Disposition: A | Discharge status: Home / Self Care | Payer: PPO | Source: Ambulatory Visit | Attending: Obstetrics and Gynecology | Admitting: Obstetrics and Gynecology

## 2018-08-17 DIAGNOSIS — Z872 Personal history of diseases of the skin and subcutaneous tissue: Secondary | ICD-10-CM

## 2018-08-17 DIAGNOSIS — R234 Changes in skin texture: Secondary | ICD-10-CM

## 2018-08-17 DIAGNOSIS — R928 Other abnormal and inconclusive findings on diagnostic imaging of breast: Secondary | ICD-10-CM | POA: Diagnosis not present

## 2018-08-17 DIAGNOSIS — N6489 Other specified disorders of breast: Secondary | ICD-10-CM | POA: Diagnosis not present

## 2018-08-24 DIAGNOSIS — H353131 Nonexudative age-related macular degeneration, bilateral, early dry stage: Secondary | ICD-10-CM | POA: Diagnosis not present

## 2018-08-24 DIAGNOSIS — Z961 Presence of intraocular lens: Secondary | ICD-10-CM | POA: Diagnosis not present

## 2018-08-24 DIAGNOSIS — H04123 Dry eye syndrome of bilateral lacrimal glands: Secondary | ICD-10-CM | POA: Diagnosis not present

## 2018-08-24 DIAGNOSIS — H26492 Other secondary cataract, left eye: Secondary | ICD-10-CM | POA: Diagnosis not present

## 2018-09-07 DIAGNOSIS — J301 Allergic rhinitis due to pollen: Secondary | ICD-10-CM | POA: Diagnosis not present

## 2018-09-15 DIAGNOSIS — M25562 Pain in left knee: Secondary | ICD-10-CM | POA: Diagnosis not present

## 2018-09-15 DIAGNOSIS — M7652 Patellar tendinitis, left knee: Secondary | ICD-10-CM | POA: Diagnosis not present

## 2018-09-20 ENCOUNTER — Ambulatory Visit
Admission: RE | Admit: 2018-09-20 | Discharge: 2018-09-20 | Disposition: A | Discharge status: Home / Self Care | Payer: PPO | Source: Ambulatory Visit | Attending: Family Medicine | Admitting: Family Medicine

## 2018-09-20 ENCOUNTER — Other Ambulatory Visit: Payer: Self-pay | Admitting: Obstetrics and Gynecology

## 2018-09-20 ENCOUNTER — Ambulatory Visit: Payer: PPO

## 2018-09-20 DIAGNOSIS — Z1231 Encounter for screening mammogram for malignant neoplasm of breast: Secondary | ICD-10-CM | POA: Diagnosis not present

## 2018-10-05 ENCOUNTER — Other Ambulatory Visit: Payer: Self-pay | Admitting: Family Medicine

## 2018-12-14 DIAGNOSIS — L739 Follicular disorder, unspecified: Secondary | ICD-10-CM | POA: Diagnosis not present

## 2018-12-30 DIAGNOSIS — F3341 Major depressive disorder, recurrent, in partial remission: Secondary | ICD-10-CM | POA: Diagnosis not present

## 2018-12-30 DIAGNOSIS — E559 Vitamin D deficiency, unspecified: Secondary | ICD-10-CM | POA: Diagnosis not present

## 2018-12-30 DIAGNOSIS — F411 Generalized anxiety disorder: Secondary | ICD-10-CM | POA: Diagnosis not present

## 2018-12-30 DIAGNOSIS — R51 Headache: Secondary | ICD-10-CM | POA: Diagnosis not present

## 2019-01-14 DIAGNOSIS — H353122 Nonexudative age-related macular degeneration, left eye, intermediate dry stage: Secondary | ICD-10-CM | POA: Diagnosis not present

## 2019-01-14 DIAGNOSIS — Z961 Presence of intraocular lens: Secondary | ICD-10-CM | POA: Diagnosis not present

## 2019-01-14 DIAGNOSIS — H25011 Cortical age-related cataract, right eye: Secondary | ICD-10-CM | POA: Diagnosis not present

## 2019-01-14 DIAGNOSIS — H2511 Age-related nuclear cataract, right eye: Secondary | ICD-10-CM | POA: Diagnosis not present

## 2019-01-14 DIAGNOSIS — H353111 Nonexudative age-related macular degeneration, right eye, early dry stage: Secondary | ICD-10-CM | POA: Diagnosis not present

## 2019-02-02 DIAGNOSIS — F3341 Major depressive disorder, recurrent, in partial remission: Secondary | ICD-10-CM | POA: Diagnosis not present

## 2019-02-02 DIAGNOSIS — F411 Generalized anxiety disorder: Secondary | ICD-10-CM | POA: Diagnosis not present

## 2019-02-09 DIAGNOSIS — H25811 Combined forms of age-related cataract, right eye: Secondary | ICD-10-CM | POA: Diagnosis not present

## 2019-02-09 DIAGNOSIS — H2511 Age-related nuclear cataract, right eye: Secondary | ICD-10-CM | POA: Diagnosis not present

## 2019-03-10 DIAGNOSIS — K219 Gastro-esophageal reflux disease without esophagitis: Secondary | ICD-10-CM | POA: Diagnosis not present

## 2019-05-07 DIAGNOSIS — S30860S Insect bite (nonvenomous) of lower back and pelvis, sequela: Secondary | ICD-10-CM | POA: Diagnosis not present

## 2019-05-25 ENCOUNTER — Other Ambulatory Visit: Payer: Self-pay | Admitting: Family Medicine

## 2019-06-06 DIAGNOSIS — L821 Other seborrheic keratosis: Secondary | ICD-10-CM | POA: Diagnosis not present

## 2019-06-06 DIAGNOSIS — L720 Epidermal cyst: Secondary | ICD-10-CM | POA: Diagnosis not present

## 2019-06-06 DIAGNOSIS — W57XXXA Bitten or stung by nonvenomous insect and other nonvenomous arthropods, initial encounter: Secondary | ICD-10-CM | POA: Diagnosis not present

## 2019-06-06 DIAGNOSIS — L814 Other melanin hyperpigmentation: Secondary | ICD-10-CM | POA: Diagnosis not present

## 2019-06-06 DIAGNOSIS — D225 Melanocytic nevi of trunk: Secondary | ICD-10-CM | POA: Diagnosis not present

## 2019-06-16 DIAGNOSIS — S30860S Insect bite (nonvenomous) of lower back and pelvis, sequela: Secondary | ICD-10-CM | POA: Diagnosis not present

## 2019-06-16 DIAGNOSIS — J302 Other seasonal allergic rhinitis: Secondary | ICD-10-CM | POA: Diagnosis not present

## 2019-06-16 DIAGNOSIS — W57XXXD Bitten or stung by nonvenomous insect and other nonvenomous arthropods, subsequent encounter: Secondary | ICD-10-CM | POA: Diagnosis not present

## 2019-06-16 DIAGNOSIS — R5382 Chronic fatigue, unspecified: Secondary | ICD-10-CM | POA: Diagnosis not present

## 2019-06-16 DIAGNOSIS — E559 Vitamin D deficiency, unspecified: Secondary | ICD-10-CM | POA: Diagnosis not present

## 2019-06-21 DIAGNOSIS — S161XXA Strain of muscle, fascia and tendon at neck level, initial encounter: Secondary | ICD-10-CM | POA: Insufficient documentation

## 2019-06-21 DIAGNOSIS — M25511 Pain in right shoulder: Secondary | ICD-10-CM | POA: Diagnosis not present

## 2019-07-14 DIAGNOSIS — F3341 Major depressive disorder, recurrent, in partial remission: Secondary | ICD-10-CM | POA: Diagnosis not present

## 2019-07-14 DIAGNOSIS — I10 Essential (primary) hypertension: Secondary | ICD-10-CM | POA: Diagnosis not present

## 2019-07-14 DIAGNOSIS — E782 Mixed hyperlipidemia: Secondary | ICD-10-CM | POA: Diagnosis not present

## 2019-07-14 DIAGNOSIS — W57XXXD Bitten or stung by nonvenomous insect and other nonvenomous arthropods, subsequent encounter: Secondary | ICD-10-CM | POA: Diagnosis not present

## 2019-07-14 DIAGNOSIS — E559 Vitamin D deficiency, unspecified: Secondary | ICD-10-CM | POA: Diagnosis not present

## 2019-07-14 DIAGNOSIS — K219 Gastro-esophageal reflux disease without esophagitis: Secondary | ICD-10-CM | POA: Diagnosis not present

## 2019-07-14 DIAGNOSIS — F411 Generalized anxiety disorder: Secondary | ICD-10-CM | POA: Diagnosis not present

## 2019-07-14 DIAGNOSIS — R198 Other specified symptoms and signs involving the digestive system and abdomen: Secondary | ICD-10-CM | POA: Diagnosis not present

## 2019-07-14 DIAGNOSIS — Z0000 Encounter for general adult medical examination without abnormal findings: Secondary | ICD-10-CM | POA: Diagnosis not present

## 2019-07-14 DIAGNOSIS — R5382 Chronic fatigue, unspecified: Secondary | ICD-10-CM | POA: Diagnosis not present

## 2019-08-19 DIAGNOSIS — Z124 Encounter for screening for malignant neoplasm of cervix: Secondary | ICD-10-CM | POA: Diagnosis not present

## 2019-08-19 DIAGNOSIS — Z1389 Encounter for screening for other disorder: Secondary | ICD-10-CM | POA: Diagnosis not present

## 2019-09-06 DIAGNOSIS — Z0000 Encounter for general adult medical examination without abnormal findings: Secondary | ICD-10-CM | POA: Diagnosis not present

## 2019-09-06 DIAGNOSIS — W57XXXD Bitten or stung by nonvenomous insect and other nonvenomous arthropods, subsequent encounter: Secondary | ICD-10-CM | POA: Diagnosis not present

## 2019-09-06 DIAGNOSIS — F411 Generalized anxiety disorder: Secondary | ICD-10-CM | POA: Diagnosis not present

## 2019-09-06 DIAGNOSIS — R5382 Chronic fatigue, unspecified: Secondary | ICD-10-CM | POA: Diagnosis not present

## 2019-09-06 DIAGNOSIS — E782 Mixed hyperlipidemia: Secondary | ICD-10-CM | POA: Diagnosis not present

## 2019-09-06 DIAGNOSIS — F3341 Major depressive disorder, recurrent, in partial remission: Secondary | ICD-10-CM | POA: Diagnosis not present

## 2019-09-06 DIAGNOSIS — R6889 Other general symptoms and signs: Secondary | ICD-10-CM | POA: Diagnosis not present

## 2019-09-06 DIAGNOSIS — E559 Vitamin D deficiency, unspecified: Secondary | ICD-10-CM | POA: Diagnosis not present

## 2019-09-06 DIAGNOSIS — I10 Essential (primary) hypertension: Secondary | ICD-10-CM | POA: Diagnosis not present

## 2019-09-15 DIAGNOSIS — H353122 Nonexudative age-related macular degeneration, left eye, intermediate dry stage: Secondary | ICD-10-CM | POA: Diagnosis not present

## 2019-09-15 DIAGNOSIS — H353111 Nonexudative age-related macular degeneration, right eye, early dry stage: Secondary | ICD-10-CM | POA: Diagnosis not present

## 2019-09-15 DIAGNOSIS — H04122 Dry eye syndrome of left lacrimal gland: Secondary | ICD-10-CM | POA: Diagnosis not present

## 2019-09-15 DIAGNOSIS — H04121 Dry eye syndrome of right lacrimal gland: Secondary | ICD-10-CM | POA: Diagnosis not present

## 2019-09-15 DIAGNOSIS — Z961 Presence of intraocular lens: Secondary | ICD-10-CM | POA: Diagnosis not present

## 2019-09-15 DIAGNOSIS — H04223 Epiphora due to insufficient drainage, bilateral lacrimal glands: Secondary | ICD-10-CM | POA: Diagnosis not present

## 2019-09-22 DIAGNOSIS — M25571 Pain in right ankle and joints of right foot: Secondary | ICD-10-CM | POA: Diagnosis not present

## 2019-09-22 DIAGNOSIS — F3341 Major depressive disorder, recurrent, in partial remission: Secondary | ICD-10-CM | POA: Diagnosis not present

## 2019-09-22 DIAGNOSIS — L089 Local infection of the skin and subcutaneous tissue, unspecified: Secondary | ICD-10-CM | POA: Diagnosis not present

## 2019-09-22 DIAGNOSIS — G8929 Other chronic pain: Secondary | ICD-10-CM | POA: Diagnosis not present

## 2019-09-22 DIAGNOSIS — E782 Mixed hyperlipidemia: Secondary | ICD-10-CM | POA: Diagnosis not present

## 2019-09-27 ENCOUNTER — Other Ambulatory Visit (INDEPENDENT_AMBULATORY_CARE_PROVIDER_SITE_OTHER): Payer: PPO

## 2019-09-27 ENCOUNTER — Ambulatory Visit: Payer: PPO | Admitting: Gastroenterology

## 2019-09-27 ENCOUNTER — Encounter: Payer: Self-pay | Admitting: Gastroenterology

## 2019-09-27 VITALS — BP 112/60 | HR 70 | Temp 97.1°F | Ht 65.0 in | Wt 131.0 lb

## 2019-09-27 DIAGNOSIS — K649 Unspecified hemorrhoids: Secondary | ICD-10-CM

## 2019-09-27 LAB — CBC WITH DIFFERENTIAL/PLATELET
Basophils Absolute: 0 10*3/uL (ref 0.0–0.1)
Basophils Relative: 0.7 % (ref 0.0–3.0)
Eosinophils Absolute: 0.1 10*3/uL (ref 0.0–0.7)
Eosinophils Relative: 1.9 % (ref 0.0–5.0)
HCT: 37.5 % (ref 36.0–46.0)
Hemoglobin: 12.7 g/dL (ref 12.0–15.0)
Lymphocytes Relative: 30.4 % (ref 12.0–46.0)
Lymphs Abs: 1.2 10*3/uL (ref 0.7–4.0)
MCHC: 34 g/dL (ref 30.0–36.0)
MCV: 94.4 fl (ref 78.0–100.0)
Monocytes Absolute: 0.4 10*3/uL (ref 0.1–1.0)
Monocytes Relative: 9 % (ref 3.0–12.0)
Neutro Abs: 2.3 10*3/uL (ref 1.4–7.7)
Neutrophils Relative %: 58 % (ref 43.0–77.0)
Platelets: 192 10*3/uL (ref 150.0–400.0)
RBC: 3.97 Mil/uL (ref 3.87–5.11)
RDW: 12.9 % (ref 11.5–15.5)
WBC: 4 10*3/uL (ref 4.0–10.5)

## 2019-09-27 LAB — COMPREHENSIVE METABOLIC PANEL
ALT: 19 U/L (ref 0–35)
AST: 22 U/L (ref 0–37)
Albumin: 4.3 g/dL (ref 3.5–5.2)
Alkaline Phosphatase: 78 U/L (ref 39–117)
BUN: 17 mg/dL (ref 6–23)
CO2: 27 mEq/L (ref 19–32)
Calcium: 9.6 mg/dL (ref 8.4–10.5)
Chloride: 102 mEq/L (ref 96–112)
Creatinine, Ser: 0.75 mg/dL (ref 0.40–1.20)
GFR: 74.81 mL/min (ref >60.00)
Glucose, Bld: 103 mg/dL — ABNORMAL HIGH (ref 70–99)
Potassium: 4.8 mEq/L (ref 3.5–5.1)
Sodium: 137 mEq/L (ref 135–145)
Total Bilirubin: 0.7 mg/dL (ref 0.2–1.2)
Total Protein: 7.4 g/dL (ref 6.0–8.3)

## 2019-09-27 MED ORDER — HYDROCORTISONE ACE-PRAMOXINE 1-1 % EX CREA: 1.0000 "application " | TOPICAL_CREAM | Freq: Two times a day (BID) | CUTANEOUS | 1 refills | Status: DC

## 2019-09-27 NOTE — Patient Instructions (Signed)
Your provider has requested that you go to the basement level for lab work before leaving today. Press "B" on the elevator. The lab is located at the first door on the left as you exit the elevator.  Please start taking Citrucel(orange flavored) powder supplement.This may cause some bloating at first,but that usually goes away.Begin with a small spoonful and work your way up to a large,heaping spoonful daily over a week  We have sent the following medications to your pharmacy for you to pick up at your convenience:  Thank you for entrusting me with your care and choosing [facility].  Dr Ardis Hughs

## 2019-09-27 NOTE — Progress Notes (Signed)
Review of pertinent gastrointestinal problems: 1.  Dysphagia.  2010 EGD Dr. Sharlett Iles: done for "gerd, chest pain, dyspahgia: erosive esophagitis and "probable occult stricture at GE junction" dilated with 39 Fr. EGD May 2017 for recurrent dysphasia was completely normal. 2.  Routine risk for colon cancer ; 06/2008 colonoscopy Dr. Jonathon Bellows colitis witherosions and submucosal hemorrhage ...very MILD inflammation...nobleeding,stenosis. fistulae, etc.. Comments:Segmental colitis....symptoms exceed objective findings in terms ofpain, degree of diarrhea c/w degree also of irritable bowel syndrome. Prior serologies are c/w IBD.  Colonoscopy July 2018 Dr. Ardis Hughs for rectal bleeding and worse than usual constipation found diverticulosis, internal hemorrhoids.  The examination was otherwise normal.  I recommended she start a fiber supplement in addition to her daily MiraLAX   HPI: This is a very pleasant 78 year old woman whom I last saw 2 and half years ago  Chief complaint is recent loose stools, anal discomfort, lower abdominal pains.  She is clearly very anxious and is a bit scattered in her history giving.  She had a spell of loose stools, watery for about 1 week last month.  This was associated with some lower abdominal pains.  She has had some anal discomfort starting around then it has persisted.  Her bowels are not back to normal, she seems to be alternating between constipation and diarrhea lately.  The diarrhea was nonbloody.  She had no sick contacts.  No antibiotics in the recent past.  Previously she was taking Citrucel and MiraLAX on a daily basis.  Lately she only takes MiraLAX as needed.  She thinks she has lost about 10 pounds however on our scale she is only down 3 pounds since her last visit here.   Her weight is down about 3 pounds since her last office visit here 2-1/2 years ago  ROS: complete GI ROS as described in HPI, all other review negative.  Constitutional:  No  unintentional weight loss   Past Medical History:  Diagnosis Date  . Actinic keratosis 12/03/2012  . Anxiety    Effexor helps--psychiatrist Dr Toy Care  . Arthritis    osteoarthritis, hands  . Atypical chest pain    EF 86%, breast attenuation, no significant ischemia  . Cervical radiculopathy 11/18/2013  . Chronic headache   . Esophagitis 01/07/2010   Qualifier: Diagnosis of  By: Surface RN, Butch Penny    . GERD (gastroesophageal reflux disease) 11/2009   EGD: Dr. Sharlett Iles: erosive stricture dilated  . ISCHEMIC COLITIS 06/05/2008   Qualifier: Diagnosis of  By: Nils Pyle CMA ([place]), Mearl Latin    . Migraines   . Mild chronic ulcerative colitis ([place])   . Osteoporosis 02/2012   t score -2.5 spine  . Palpitations   . Rotator cuff tendonitis 05/26/2011  . 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 AUGMENTATION MAMMAPLASTY Bilateral    silicone gel implants  . Birth mark removed    . CATARACT EXTRACTION Left   . COLPOSCOPY    . laser vaporization of upper vagina  1998  . NM MYOCAR PERF WALL MOTION  01/24/2008   EF 86% neg ischemia  . VAGINAL HYSTERECTOMY  1978    Current Outpatient Medications  Medication Sig Dispense Refill  . azelastine (ASTELIN) 0.1 % nasal spray USE TWO SPRAYS IN EACH NOSTRIL TWICE DAILY AS DIRECTED 30 mL 5  . beta carotene w/minerals (OCUVITE) tablet Take 1 tablet by mouth daily.    . Bilberry, Vaccinium myrtillus, (BILBERRY PO) Take 1 capsule by  mouth daily.    . Cholecalciferol (D3-1000 PO) Take by mouth.    . Fish Oil-Cholecalciferol (OMEGA-3 FISH OIL/VITAMIN D3 PO) Take by mouth.    . fluticasone (FLONASE) 50 MCG/ACT nasal spray Place 2 sprays into both nostrils daily. 16 g 6  . hyoscyamine (LEVSIN, ANASPAZ) 0.125 MG tablet Take 1 tablet (0.125 mg total) by mouth every 8 (eight) hours. As needed 30 tablet 0  . metoprolol tartrate (LOPRESSOR) 25 MG tablet Take 1 tablet (25 mg total) by mouth  2 (two) times daily. 180 tablet 3  . Multiple Vitamins-Minerals (ICAPS LUTEIN & ZEAXANTHIN PO) Take by mouth.    Marland Kitchen omeprazole (PRILOSEC) 20 MG capsule omeprazole 20 mg capsule,delayed release    . OVER THE COUNTER MEDICATION 4 mg.    . rosuvastatin (CRESTOR) 5 MG tablet Take 1 tablet (5 mg total) daily by mouth. 30 tablet 3  . venlafaxine XR (EFFEXOR-XR) 75 MG 24 hr capsule TAKE ONE CAPSULE BY MOUTH ONCE DAILY WITH  BREAKFAST 90 capsule 3   No current facility-administered medications for this visit.     Allergies as of 09/27/2019 - Review Complete 09/27/2019  Allergen Reaction Noted  . Levaquin [levofloxacin in d5w] Nausea Only 12/01/2013    Family History  Problem Relation Age of Onset  . Heart disease Mother   . Heart failure Mother   . Breast cancer Mother 32  . Prostate cancer Father   . Inflammatory bowel disease Sister   . Diabetes Maternal Aunt   . Colon cancer Paternal Grandfather   . Stomach cancer Neg Hx   . Pancreatic cancer Neg Hx     Social History   Socioeconomic History  . Marital status: Married    Spouse name: Not on file  . Number of children: Not on file  . Years of education: Not on file  . Highest education level: Not on file  Occupational History  . Not on file  Social Needs  . Financial resource strain: Not on file  . Food insecurity    Worry: Not on file    Inability: Not on file  . Transportation needs    Medical: Not on file    Non-medical: Not on file  Tobacco Use  . Smoking status: Former Smoker    Packs/day: 1.00    Years: 10.00    Pack years: 10.00    Types: Cigarettes    Quit date: 12/15/1988    Years since quitting: 30.8  . Smokeless tobacco: Never Used  . Tobacco comment: quit in the 1980's  Substance and Sexual Activity  . Alcohol use: Yes    Alcohol/week: 14.0 standard drinks    Types: 14 Standard drinks or equivalent per week    Comment: drinks wine every day; 2 drinks a day   . Drug use: No  . Sexual activity: Never     Birth control/protection: Surgical, Post-menopausal    Comment: HYST  Lifestyle  . Physical activity    Days per week: Not on file    Minutes per session: Not on file  . Stress: Not on file  Relationships  . Social Herbalist on phone: Not on file    Gets together: Not on file    Attends religious service: Not on file    Active member of club or organization: Not on file    Attends meetings of clubs or organizations: Not on file    Relationship status: Not on file  . Intimate partner violence  Fear of current or ex partner: Not on file    Emotionally abused: Not on file    Physically abused: Not on file    Forced sexual activity: Not on file  Other Topics Concern  . Not on file  Social History Narrative   Married, mother of one.  Lives with spouse and 4 dogs.   Very active. Regular exercise  -- yes.  Quit smoking in 1990.     She monitors her diet well.     1-2 glasses of wine almost every night.     Physical Exam: BP 112/60   Pulse 70   Temp (!) 97.1 F (36.2 C)   Ht 5\' 5"  (1.651 m)   Wt 131 lb (59.4 kg)   BMI 21.80 kg/m  Constitutional: generally well-appearing Psychiatric: alert and oriented x3 Abdomen: soft, nontender, nondistended, no obvious ascites, no peritoneal signs, normal bowel sounds No peripheral edema noted in lower extremities Rectal examination with female assistant in the room including anoscopy.  No external anal hemorrhoids, no obvious anal fissures, with anoscopy she clearly has some slightly swollen internal anal hemorrhoids, posterior.  Assessment and plan: 78 y.o. female with change in bowels, recent diarrheal illness, internal hemorrhoids  She is quite anxious and a bit scattered in her history giving today.  Does seem that she had some type of change in her bowels about a month ago and it has for the most part improved but she is still alternating with constipation diarrhea since then.  She is also had some lower abdominal  discomforts and obviously internal hemorrhoids are aggravated.  She is going to add fiber supplements to her daily regimen to see if we can even out the constipation, loose alternating that she is doing.  She will also have GI pathogen panel tested and hope to get labs and she tells me she already had them done at [facility] within the last week and so we will send away for those hopefully CBC and complete metabolic profile.  She is going to get a prescription for Analpram to apply the hemorrhoids twice daily and she will return to see me in 2 months.  Please see the "Patient Instructions" section for addition details about the plan.  Owens Loffler, MD [facility] 09/27/2019, 9:59 AM

## 2019-09-29 ENCOUNTER — Other Ambulatory Visit: Payer: PPO

## 2019-09-29 DIAGNOSIS — K649 Unspecified hemorrhoids: Secondary | ICD-10-CM | POA: Diagnosis not present

## 2019-09-30 LAB — GASTROINTESTINAL PATHOGEN PANEL PCR
C. difficile Tox A/B, PCR: NOT DETECTED
Campylobacter, PCR: NOT DETECTED
Cryptosporidium, PCR: NOT DETECTED
E coli (ETEC) LT/ST PCR: NOT DETECTED
E coli (STEC) stx1/stx2, PCR: NOT DETECTED
E coli 0157, PCR: NOT DETECTED
Giardia lamblia, PCR: NOT DETECTED
Norovirus, PCR: NOT DETECTED
Rotavirus A, PCR: NOT DETECTED
Salmonella, PCR: NOT DETECTED
Shigella, PCR: NOT DETECTED

## 2019-10-04 ENCOUNTER — Telehealth: Payer: Self-pay

## 2019-11-03 DIAGNOSIS — Z1231 Encounter for screening mammogram for malignant neoplasm of breast: Secondary | ICD-10-CM | POA: Diagnosis not present

## 2019-11-09 ENCOUNTER — Other Ambulatory Visit: Payer: Self-pay

## 2019-11-09 DIAGNOSIS — Z20822 Contact with and (suspected) exposure to covid-19: Secondary | ICD-10-CM

## 2019-11-11 LAB — NOVEL CORONAVIRUS, NAA: SARS-CoV-2, NAA: NOT DETECTED

## 2019-11-18 ENCOUNTER — Encounter: Payer: Self-pay | Admitting: Gastroenterology

## 2019-11-18 ENCOUNTER — Ambulatory Visit (INDEPENDENT_AMBULATORY_CARE_PROVIDER_SITE_OTHER): Payer: PPO | Admitting: Gastroenterology

## 2019-11-18 ENCOUNTER — Other Ambulatory Visit: Payer: Self-pay

## 2019-11-18 VITALS — BP 146/70 | HR 72 | Temp 98.4°F | Ht 64.57 in | Wt 135.0 lb

## 2019-11-18 DIAGNOSIS — K5900 Constipation, unspecified: Secondary | ICD-10-CM | POA: Diagnosis not present

## 2019-11-18 DIAGNOSIS — K649 Unspecified hemorrhoids: Secondary | ICD-10-CM | POA: Diagnosis not present

## 2019-11-18 NOTE — Patient Instructions (Signed)
Stay on your current constipation regimen for several more months.   If your hemorrhoids continue to bother you we can refer you to one of our Doctors for banding.   I appreciate the opportunity to care for you. Owens Loffler, MD

## 2019-11-18 NOTE — Progress Notes (Signed)
Review of pertinent gastrointestinal problems: 1.  Dysphagia.  2010 EGD Dr. Mariea Clonts for "gerd, chest pain, dyspahgia: erosive esophagitis and "probable occult stricture at GE junction" dilated with 75 Fr. EGD May 2017 for recurrent dysphasia was completely normal. 2.  Routine risk for colon cancer ; 06/2008 colonoscopy Dr. Jonathon Bellows colitis witherosions and submucosal hemorrhage ...very MILD inflammation...nobleeding,stenosis. fistulae, etc.. Comments:Segmental colitis....symptoms exceed objective findings in terms ofpain, degree of diarrhea c/w degree also of irritable bowel syndrome. Prior serologies are c/w IBD.  Colonoscopy July 2018 Dr. Ardis Hughs for rectal bleeding and worse than usual constipation found diverticulosis, internal hemorrhoids.  The examination was otherwise normal.  I recommended she start a fiber supplement in addition to her daily MiraLAX 3.  Mild constipation 2020 improved on daily fiber supplement and as needed MiraLAX.    HPI: This is a very pleasant 78 year old woman  I last saw her 2 months ago for change in bowels and internal hemorrhoids.  On examination she did have some slightly swollen internal anal hemorrhoids.  She was having some alternating between constipation and loose stools.  I recommended that she add a fiber supplement to her daily regimen to see if we could even out the constipation, alternating loose stools.  Blood work at that time showed normal CBC, normal complete metabolic profile and a GI pathogen panel which she turned in about a week or 10 days ago was also normal.  Since that visit she has been on fiber supplement on a daily basis.  She requires MiraLAX about 2 or 3 times a month for some straining to move her bowels.  She is eating more fruit.  She is content with this regimen for her bowels.  She does still have bothersome somewhat uncomfortable anus likely from her internal hemorrhoids.  She says these are worse when she pushes and  strains to move her bowels.  She has been putting on topical steroids and that always helps.  She will have to reach for these about 2 or 3 times per week.  Chief complaint is internal hemorrhoids, mild chronic constipation  ROS: complete GI ROS as described in HPI, all other review negative.  Constitutional:  No unintentional weight loss   Past Medical History:  Diagnosis Date  . Actinic keratosis 12/03/2012  . Anxiety    Effexor helps--psychiatrist Dr Toy Care  . Arthritis    osteoarthritis, hands  . Atypical chest pain    EF 86%, breast attenuation, no significant ischemia  . Cervical radiculopathy 11/18/2013  . Chronic headache   . Esophagitis 01/07/2010   Qualifier: Diagnosis of  By: Surface RN, Butch Penny    . GERD (gastroesophageal reflux disease) 11/2009   EGD: Dr. Sharlett Iles: erosive stricture dilated  . ISCHEMIC COLITIS 06/05/2008   Qualifier: Diagnosis of  By: Nils Pyle CMA ([place]), Mearl Latin    . Migraines   . 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 Osteoporosis 02/2012   t score -2.5 spine  . Palpitations   . Rotator cuff tendonitis 05/26/2011  . 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 AUGMENTATION MAMMAPLASTY Bilateral    silicone gel implants  . Birth mark removed    . CATARACT EXTRACTION Bilateral   . COLPOSCOPY    . laser vaporization of upper vagina  1998  . NM MYOCAR PERF WALL MOTION  01/24/2008   EF 86% neg ischemia  . VAGINAL HYSTERECTOMY  1978    Current Outpatient Medications  Medication Sig Dispense Refill  . azelastine (ASTELIN) 0.1 % nasal spray USE TWO SPRAYS IN EACH NOSTRIL TWICE DAILY AS DIRECTED 30 mL 5  . Bilberry, Vaccinium myrtillus, (BILBERRY PO) Take 1 capsule by mouth daily.    . Cholecalciferol (D3-1000 PO) Take by mouth.    . fluticasone (FLONASE) 50 MCG/ACT nasal spray Place 2 sprays into both nostrils daily. 16 g 6  . metoprolol tartrate (LOPRESSOR) 25 MG tablet  Take 1 tablet (25 mg total) by mouth 2 (two) times daily. 180 tablet 3  . Multiple Vitamins-Minerals (ICAPS LUTEIN & ZEAXANTHIN PO) Take by mouth.    Marland Kitchen omeprazole (PRILOSEC) 20 MG capsule omeprazole 20 mg capsule,delayed release    . pramoxine-hydrocortisone (PROCTOCREAM-HC) 1-1 % rectal cream Place 1 application rectally 2 (two) times daily. 30 g 1  . rosuvastatin (CRESTOR) 5 MG tablet Take 1 tablet (5 mg total) daily by mouth. 30 tablet 3  . venlafaxine XR (EFFEXOR-XR) 75 MG 24 hr capsule TAKE ONE CAPSULE BY MOUTH ONCE DAILY WITH  BREAKFAST 90 capsule 3   No current facility-administered medications for this visit.     Allergies as of 11/18/2019 - Review Complete 11/18/2019  Allergen Reaction Noted  . Levaquin [levofloxacin in d5w] Nausea Only 12/01/2013    Family History  Problem Relation Age of Onset  . Heart disease Mother   . Heart failure Mother   . Breast cancer Mother 54  . Prostate cancer Father   . Inflammatory bowel disease Sister   . Diabetes Maternal Aunt   . Colon cancer Paternal Grandfather   . Stomach cancer Neg Hx   . Pancreatic cancer Neg Hx     Social History   Socioeconomic History  . Marital status: Married    Spouse name: Not on file  . Number of children: Not on file  . Years of education: Not on file  . Highest education level: Not on file  Occupational History  . Not on file  Social Needs  . Financial resource strain: Not on file  . Food insecurity    Worry: Not on file    Inability: Not on file  . Transportation needs    Medical: Not on file    Non-medical: Not on file  Tobacco Use  . Smoking status: Former Smoker    Packs/day: 1.00    Years: 10.00    Pack years: 10.00    Types: Cigarettes    Quit date: 12/15/1988    Years since quitting: 30.9  . Smokeless tobacco: Never Used  . Tobacco comment: quit in the 1980's  Substance and Sexual Activity  . Alcohol use: Yes    Alcohol/week: 14.0 standard drinks    Types: 14 Standard drinks or  equivalent per week    Comment: drinks wine every day; 2 drinks a day   . Drug use: No  . Sexual activity: Never    Birth control/protection: Surgical, Post-menopausal    Comment: HYST  Lifestyle  . Physical activity    Days per week: Not on file    Minutes per session: Not on file  . Stress: Not on file  Relationships  . Social Herbalist on phone: Not on file    Gets together: Not on file    Attends religious service: Not on file    Active member of club or organization: Not on file    Attends meetings of clubs or organizations: Not on file    Relationship status: Not on file  .  Intimate partner violence    Fear of current or ex partner: Not on file    Emotionally abused: Not on file    Physically abused: Not on file    Forced sexual activity: Not on file  Other Topics Concern  . Not on file  Social History Narrative   Married, mother of one.  Lives with spouse and 4 dogs.   Very active. Regular exercise  -- yes.  Quit smoking in 1990.     She monitors her diet well.     1-2 glasses of wine almost every night.     Physical Exam: Temp 98.4 F (36.9 C)   Ht 5' 4.57" (1.64 m) Comment: height measured without shoes  Wt 135 lb (61.2 kg)   BMI 22.77 kg/m  Constitutional: generally well-appearing Psychiatric: alert and oriented x3 Abdomen: soft, nontender, nondistended, no obvious ascites, no peritoneal signs, normal bowel sounds No peripheral edema noted in lower extremities  Assessment and plan: 78 y.o. female with mild chronic constipation, internal hemorrhoids  She is pretty content with the way her bowels are moving on a daily Citrucel and as needed MiraLAX.  She has to grab her MiraLAX 2 or 3 times a month at most.  She is still bothered however by some internal hemorrhoid discomforts.  They never bleed but they can sometimes be a bit uncomfortable.  Topical steroids help.  I offered her referral to one of my partners for in office hemorrhoidal banding  consideration.  She prefer not to do that just yet and instead she is going to give her new bowel regimen another couple months and she will call after that.  If she is still bothered by internal hemorrhoid type symptoms then we will arrange a referral for an office hemorrhoid banding.  Please see the "Patient Instructions" section for addition details about the plan.  Owens Loffler, MD [facility] 11/18/2019, 9:53 AM

## 2019-11-21 ENCOUNTER — Telehealth: Payer: Self-pay | Admitting: Gastroenterology

## 2019-11-21 NOTE — Telephone Encounter (Signed)
The pt called back and will be scheduled by the [facility] an appt to discuss banding.

## 2019-11-21 NOTE — Telephone Encounter (Signed)
Left message on machine to call back  

## 2019-11-21 NOTE — Telephone Encounter (Signed)
Pt requested to schedule a hemorrhoid banding.

## 2019-12-01 ENCOUNTER — Encounter: Payer: Self-pay | Admitting: *Deleted

## 2019-12-04 DIAGNOSIS — I10 Essential (primary) hypertension: Secondary | ICD-10-CM | POA: Diagnosis not present

## 2019-12-04 DIAGNOSIS — E782 Mixed hyperlipidemia: Secondary | ICD-10-CM | POA: Diagnosis not present

## 2019-12-04 DIAGNOSIS — F3341 Major depressive disorder, recurrent, in partial remission: Secondary | ICD-10-CM | POA: Diagnosis not present

## 2019-12-05 ENCOUNTER — Ambulatory Visit: Payer: PPO | Admitting: Podiatry

## 2019-12-05 ENCOUNTER — Encounter: Payer: Self-pay | Admitting: Podiatry

## 2019-12-05 ENCOUNTER — Other Ambulatory Visit: Payer: Self-pay

## 2019-12-05 ENCOUNTER — Ambulatory Visit (INDEPENDENT_AMBULATORY_CARE_PROVIDER_SITE_OTHER): Payer: PPO

## 2019-12-05 VITALS — BP 135/68 | HR 66 | Temp 97.4°F | Resp 16

## 2019-12-05 DIAGNOSIS — S99921A Unspecified injury of right foot, initial encounter: Secondary | ICD-10-CM | POA: Diagnosis not present

## 2019-12-05 DIAGNOSIS — M84374A Stress fracture, right foot, initial encounter for fracture: Secondary | ICD-10-CM | POA: Diagnosis not present

## 2019-12-05 DIAGNOSIS — M779 Enthesopathy, unspecified: Secondary | ICD-10-CM

## 2019-12-05 MED ORDER — IBUPROFEN 800 MG PO TABS: 800.0000 mg | ORAL_TABLET | Freq: Three times a day (TID) | ORAL | 0 refills | Status: DC | PRN

## 2019-12-06 ENCOUNTER — Encounter: Payer: Self-pay | Admitting: Internal Medicine

## 2019-12-06 ENCOUNTER — Ambulatory Visit (INDEPENDENT_AMBULATORY_CARE_PROVIDER_SITE_OTHER): Payer: PPO | Admitting: Internal Medicine

## 2019-12-06 VITALS — BP 130/60 | HR 74 | Temp 97.8°F | Ht 65.0 in | Wt 133.0 lb

## 2019-12-06 DIAGNOSIS — K602 Anal fissure, unspecified: Secondary | ICD-10-CM

## 2019-12-06 DIAGNOSIS — K648 Other hemorrhoids: Secondary | ICD-10-CM

## 2019-12-06 DIAGNOSIS — K5900 Constipation, unspecified: Secondary | ICD-10-CM | POA: Diagnosis not present

## 2019-12-06 MED ORDER — DILTIAZEM GEL 2 %: CUTANEOUS | 0 refills | Status: DC

## 2019-12-06 NOTE — Progress Notes (Signed)
Subjective:    Patient ID: [name], female    DOB: June 14, 1941, 78 y.o.   MRN: [id number]  HPI [name] is a 78 year old female with a past medical history of chronic constipation, internal hemorrhoids, anal fissure who is seen in consult at the request of Dr. Ardis Hughs to consider hemorrhoidal banding.  She is here alone today.  She reports that she has had intermittent issues with hemorrhoids but also history of anal fissure.  In the last several weeks this has been particularly bothersome.  She has had anal pain.  There is pain with defecation.  There is throbbing after defecation.  Preparation H seems to help.  Blood is seen a few times with her bowel movement.  She does have a history of inconsistent bowel movements with alternating constipation and loose stool.  Predominantly she is constipated.  She is used Citrucel about 3 times a week but not daily and MiraLAX even less frequently than that.  It is at times hard to produce a stool.  She does occasionally feel prolapse from the rectum.  She has had anal pain as above, itching.  She has been treated before for anal fissures but this was years ago by surgery.  Dr. Ardis Hughs performed her last colonoscopy on 06/26/2017.  This was an excellent prep.  Exam to the cecum.  There was left colonic diverticulosis and internal hemorrhoids.  Otherwise normal   Review of Systems As per HPI, otherwise negative  Current Medications, Allergies, Past Medical History, Past Surgical History, Family History and Social History were reviewed in [employer] record.     Objective:   Physical Exam BP 130/60   Pulse 74   Temp 97.8 F (36.6 C)   Ht 5\' 5"  (1.651 m)   Wt 133 lb (60.3 kg)   BMI 22.13 kg/m  Gen: awake, alert, NAD HEENT: anicteric Abd: soft, NT/ND, +BS throughout ANOSCOPY: Using a disposable, lubricated, slotted, self-illuminating anoscope, the rectum was intubated without difficulty. The trochar was removed and  the ano-rectum was circumferentially inspected. There were small internal hemorrhoids, RA=LL>RP. There was a posterior anorectal fissure which was tender. The rectal mucosa was not inflamed. No neoplasia or other pathology was identified. The inspection was well tolerated.  Ext: no c/c/e Neuro: nonfocal      Assessment & Plan:  78 year old female with a past medical history of chronic constipation, internal hemorrhoids, anal fissure who is seen in consult at the request of Dr. Ardis Hughs to consider hemorrhoidal banding.  1.  Anal fissure/internal hemorrhoid/alternating bowel habits with constipation predominance --we discussed symptoms at length today.  Anal fissure is felt responsible for her most recent acute anorectal pain and discomfort.  We discussed this today.  I do think the internal hemorrhoids are intermittently symptomatic and would likely benefit from banding but I recommend we treat the fissure first.  I also discussed with her how her constipation predominant irregular bowel movements are exacerbating these issues.  I recommend the following --Be more consistent with Citrucel using a heaping tablespoon at least daily possibly twice daily --Use MiraLAX if stools are hard or difficult to pass --Begin diltiazem gel 2% 2-3 times a day, pea-sized amount inserted to the first knuckle for 2 to 3 weeks until symptoms improve.  Can mix with RectiCare per box instruction for discomfort/anal pain --Return in about a month after fissure has healed for consideration of proceeding with hemorrhoidal banding for symptomatic internal hemorrhoids  25 minutes spent with the patient  today. Greater than 50% was spent in counseling and coordination of care with the patient

## 2019-12-06 NOTE — Patient Instructions (Addendum)
Your provider has prescribed diltiazemgel for you. Please follow the directions written on your prescription bottle or given to you specifically by your provider. Since this is a specialty medication and is not readily available at most local pharmacies, we have sent your prescription to:  [facility] information is below: Address: [address] [place], [place] [address]  [phone]  *Please DO NOT go directly from our office to pick up this medication! Give the pharmacy 1 day to process the prescription as this is compounded and takes time to make.  Use Citrucel daily.  If you are still having problems, add Miralax.  Please follow up for a banding appointment on 01/10/2020 at 3:40pm

## 2019-12-12 NOTE — Progress Notes (Signed)
Subjective:   Patient ID: [name], female   DOB: 78 y.o.   MRN: [id number]   HPI 78 year old female presents the office with concerns of right foot pain.  She said that she been having pain over the last 6 to 8 months after she hit her toe on a coffee table.  She states that the pain started on the second toe but is becoming more painful on the third toe and does hurt to put pressure on her foot at times.  She does get swelling intermittently.  No redness or warmth.  She is under recent treatment.  She has no other concerns.   Review of Systems  All other systems reviewed and are negative.  Past Medical History:  Diagnosis Date  . Actinic keratosis 12/03/2012  . Anxiety    Effexor helps--psychiatrist Dr Toy Care  . Arthritis    osteoarthritis, hands  . Atypical chest pain    EF 86%, breast attenuation, no significant ischemia  . Cervical radiculopathy 11/18/2013  . Chronic headache   . Diverticulosis   . Erosive esophagitis   . GERD (gastroesophageal reflux disease) 11/2009   EGD: Dr. Sharlett Iles: erosive stricture dilated  . Internal hemorrhoids   . ISCHEMIC COLITIS 06/05/2008   Qualifier: Diagnosis of  By: Nils Pyle CMA ([place]), Mearl Latin    . Migraines   . Mild chronic ulcerative colitis ([place])   . Osteoporosis 02/2012   t score -2.5 spine  . Palpitations   . Rotator cuff tendonitis 05/26/2011  . 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 AUGMENTATION MAMMAPLASTY Bilateral    silicone gel implants  . Birth mark removed    . CATARACT EXTRACTION Bilateral   . COLPOSCOPY    . laser vaporization of upper vagina  1998  . NM MYOCAR PERF WALL MOTION  01/24/2008   EF 86% neg ischemia  . VAGINAL HYSTERECTOMY  1978     Current Outpatient Medications:  .  azelastine (ASTELIN) 0.1 % nasal spray, USE TWO SPRAYS IN EACH NOSTRIL TWICE DAILY AS DIRECTED, Disp: 30 mL, Rfl: 5 .  Bilberry, Vaccinium myrtillus,  (BILBERRY PO), Take 1 capsule by mouth daily., Disp: , Rfl:  .  Cholecalciferol (D3-1000 PO), Take by mouth., Disp: , Rfl:  .  diltiazem 2 % GEL, Apply a pea sized amount to tip of finger and insert into the rectum up the first knuckle 2-3 times a day, Disp: 30 g, Rfl: 0 .  fluticasone (FLONASE) 50 MCG/ACT nasal spray, Place 2 sprays into both nostrils daily., Disp: 16 g, Rfl: 6 .  ibuprofen (ADVIL) 800 MG tablet, Take 1 tablet (800 mg total) by mouth every 8 (eight) hours as needed., Disp: 30 tablet, Rfl: 0 .  influenza vac recom quadrivalent (FLUBLOK QUADRIVALENT) 0.5 ML injection, Flublok Quad 2020-2021 (PF) 180 mcg (45 mcg x 4)/0.5 mL IM syringe, Disp: , Rfl:  .  metoprolol tartrate (LOPRESSOR) 25 MG tablet, Take 1 tablet (25 mg total) by mouth 2 (two) times daily., Disp: 180 tablet, Rfl: 3 .  Multiple Vitamins-Minerals (ICAPS LUTEIN & ZEAXANTHIN PO), Take by mouth., Disp: , Rfl:  .  omeprazole (PRILOSEC) 20 MG capsule, omeprazole 20 mg capsule,delayed release, Disp: , Rfl:  .  pramoxine-hydrocortisone (PROCTOCREAM-HC) 1-1 % rectal cream, Place 1 application rectally 2 (two) times daily., Disp: 30 g, Rfl: 1 .  rosuvastatin (CRESTOR) 10 MG tablet, rosuvastatin 10 mg tablet, Disp: , Rfl:  .  venlafaxine  XR (EFFEXOR-XR) 75 MG 24 hr capsule, TAKE ONE CAPSULE BY MOUTH ONCE DAILY WITH  BREAKFAST, Disp: 90 capsule, Rfl: 3  Allergies  Allergen Reactions  . Levaquin [Levofloxacin In D5w] Nausea Only    Dizziness and tremulousness         Objective:  Physical Exam  General: AAO x3, NAD  Dermatological: Skin is warm, dry and supple bilateral. Nails x 10 are well manicured; remaining integument appears unremarkable at this time. There are no open sores, no preulcerative lesions, no rash or signs of infection present.  Vascular: Dorsalis Pedis artery and Posterior Tibial artery pedal pulses are 2/4 bilateral with immedate capillary fill time. Pedal hair growth present. No varicosities and no lower  extremity edema present bilateral. There is no pain with calf compression, swelling, warmth, erythema.   Neruologic: Grossly intact via light touch bilateral. Vibratory intact via tuning fork bilateral.   Musculoskeletal: There is tenderness present on the second and third metatarsal necks and there is localized edema there is no erythema or warmth.  No pain to the digits itself but there is tenderness on the MPJs.  Flexor, extensor tendons appear to be intact.  Muscular strength 5/5 in all groups tested bilateral.  Gait: Unassisted, Nonantalgic.       Assessment:   Right foot pain, capsulitis and concern for stress fracture    Plan:  -Treatment options discussed including all alternatives, risks, and complications -Etiology of symptoms were discussed -X-rays were obtained and reviewed with the patient. Edema is present with no evidence of acute fracture identified. -Given the tenderness recommend bone concern for stress fracture.  Recommend immobilization in a surgical shoe but she declined this.  Discussed wearing stiffer soled shoe. -Ice daily. -Ibuprofen as needed  Return in about 4 weeks (around 01/02/2020) for right foot pain, stress fracture .  Trula Slade DPM

## 2019-12-22 DIAGNOSIS — F3341 Major depressive disorder, recurrent, in partial remission: Secondary | ICD-10-CM | POA: Diagnosis not present

## 2019-12-22 DIAGNOSIS — F411 Generalized anxiety disorder: Secondary | ICD-10-CM | POA: Diagnosis not present

## 2020-01-02 ENCOUNTER — Ambulatory Visit (INDEPENDENT_AMBULATORY_CARE_PROVIDER_SITE_OTHER): Payer: PPO

## 2020-01-02 ENCOUNTER — Other Ambulatory Visit: Payer: Self-pay

## 2020-01-02 ENCOUNTER — Ambulatory Visit: Payer: PPO | Admitting: Podiatry

## 2020-01-02 DIAGNOSIS — M84374D Stress fracture, right foot, subsequent encounter for fracture with routine healing: Secondary | ICD-10-CM

## 2020-01-02 DIAGNOSIS — B351 Tinea unguium: Secondary | ICD-10-CM

## 2020-01-02 DIAGNOSIS — Z79899 Other long term (current) drug therapy: Secondary | ICD-10-CM | POA: Diagnosis not present

## 2020-01-02 DIAGNOSIS — N893 Dysplasia of vagina, unspecified: Secondary | ICD-10-CM | POA: Insufficient documentation

## 2020-01-02 DIAGNOSIS — M84374A Stress fracture, right foot, initial encounter for fracture: Secondary | ICD-10-CM

## 2020-01-02 LAB — CBC WITH DIFFERENTIAL/PLATELET
Absolute Monocytes: 539 cells/uL (ref 200–950)
Basophils Absolute: 19 cells/uL (ref 0–200)
Basophils Relative: 0.3 %
Eosinophils Absolute: 118 cells/uL (ref 15–500)
Eosinophils Relative: 1.9 %
HCT: 38.2 % (ref 35.0–45.0)
Hemoglobin: 13.1 g/dL (ref 11.7–15.5)
Lymphs Abs: 1693 cells/uL (ref 850–3900)
MCH: 32 pg (ref 27.0–33.0)
MCHC: 34.3 g/dL (ref 32.0–36.0)
MCV: 93.2 fL (ref 80.0–100.0)
MPV: 11.3 fL (ref 7.5–12.5)
Monocytes Relative: 8.7 %
Neutro Abs: 3832 cells/uL (ref 1500–7800)
Neutrophils Relative %: 61.8 %
Platelets: 188 10*3/uL (ref 140–400)
RBC: 4.1 10*6/uL (ref 3.80–5.10)
RDW: 12.1 % (ref 11.0–15.0)
Total Lymphocyte: 27.3 %
WBC: 6.2 10*3/uL (ref 3.8–10.8)

## 2020-01-02 LAB — HEPATIC FUNCTION PANEL
AG Ratio: 1.5 (calc) (ref 1.0–2.5)
ALT: 22 U/L (ref 6–29)
AST: 23 U/L (ref 10–35)
Albumin: 4.2 g/dL (ref 3.6–5.1)
Alkaline phosphatase (APISO): 79 U/L (ref 37–153)
Bilirubin, Direct: 0.1 mg/dL (ref 0.0–0.2)
Globulin: 2.8 g/dL (calc) (ref 1.9–3.7)
Indirect Bilirubin: 0.5 mg/dL (calc) (ref 0.2–1.2)
Total Bilirubin: 0.6 mg/dL (ref 0.2–1.2)
Total Protein: 7 g/dL (ref 6.1–8.1)

## 2020-01-02 NOTE — Patient Instructions (Signed)
Terbinafine oral granules What is this medicine? TERBINAFINE (TER bin a feen) is an antifungal medicine. It is used to treat certain kinds of fungal or yeast infections. This medicine may be used for other purposes; ask your health care provider or pharmacist if you have questions. COMMON BRAND NAME(S): Lamisil What should I tell my health care provider before I take this medicine? They need to know if you have any of these conditions:  drink alcoholic beverages  kidney disease  liver disease  an unusual or allergic reaction to Terbinafine, other medicines, foods, dyes, or preservatives  pregnant or trying to get pregnant  breast-feeding How should I use this medicine? Take this medicine by mouth. Follow the directions on the prescription label. Hold packet with cut line on top. Shake packet gently to settle contents. Tear packet open along cut line, or use scissors to cut across line. Carefully pour the entire contents of packet onto a spoonful of a soft food, such as pudding or other soft, non-acidic food such as mashed potatoes (do NOT use applesauce or a fruit-based food). If two packets are required for each dose, you may either sprinkle the content of both packets on one spoonful of non-acidic food, or sprinkle the contents of both packets on two spoonfuls of non-acidic food. Make sure that no granules remain in the packet. Swallow the mxiture of the food and granules without chewing. Take your medicine at regular intervals. Do not take it more often than directed. Take all of your medicine as directed even if you think you are better. Do not skip doses or stop your medicine early. Contact your pediatrician or health care professional regarding the use of this medicine in children. While this medicine may be prescribed for children as young as 4 years for selected conditions, precautions do apply. Overdosage: If you think you have taken too much of this medicine contact a poison control  center or emergency room at once. NOTE: This medicine is only for you. Do not share this medicine with others. What if I miss a dose? If you miss a dose, take it as soon as you can. If it is almost time for your next dose, take only that dose. Do not take double or extra doses. What may interact with this medicine? Do not take this medicine with any of the following medications:  thioridazine This medicine may also interact with the following medications:  beta-blockers  caffeine  cimetidine  cyclosporine  MAOIs like Carbex, Eldepryl, Marplan, Nardil, and Parnate  medicines for fungal infections like fluconazole and ketoconazole  medicines for irregular heartbeat like amiodarone, flecainide and propafenone  rifampin  SSRIs like citalopram, escitalopram, fluoxetine, fluvoxamine, paroxetine and sertraline  tricyclic antidepressants like amitriptyline, clomipramine, desipramine, imipramine, nortriptyline, and others  warfarin This list may not describe all possible interactions. Give your health care provider a list of all the medicines, herbs, non-prescription drugs, or dietary supplements you use. Also tell them if you smoke, drink alcohol, or use illegal drugs. Some items may interact with your medicine. What should I watch for while using this medicine? Your doctor may monitor your liver function. Tell your doctor right away if you have nausea or vomiting, loss of appetite, stomach pain on your right upper side, yellow skin, dark urine, light stools, or are over tired. This medicine may cause serious skin reactions. They can happen weeks to months after starting the medicine. Contact your health care provider right away if you notice fevers or flu-like symptoms   with a rash. The rash may be red or purple and then turn into blisters or peeling of the skin. Or, you might notice a red rash with swelling of the face, lips or lymph nodes in your neck or under your arms. You need to take  this medicine for 6 weeks or longer to cure the fungal infection. Take your medicine regularly for as long as your doctor or health care provider tells you to. What side effects may I notice from receiving this medicine? Side effects that you should report to your doctor or health care professional as soon as possible:  allergic reactions like skin rash or hives, swelling of the face, lips, or tongue  change in vision  dark urine  fever or infection  general ill feeling or flu-like symptoms  light-colored stools  loss of appetite, nausea  rash, fever, and swollen lymph nodes  redness, blistering, peeling or loosening of the skin, including inside the mouth  right upper belly pain  unusually weak or tired  yellowing of the eyes or skin Side effects that usually do not require medical attention (report to your doctor or health care professional if they continue or are bothersome):  changes in taste  diarrhea  hair loss  muscle or joint pain  stomach upset This list may not describe all possible side effects. Call your doctor for medical advice about side effects. You may report side effects to FDA at 1-800-FDA-1088. Where should I keep my medicine? Keep out of the reach of children. Store at room temperature between 15 and 30 degrees C (59 and 86 degrees F). Throw away any unused medicine after the expiration date. NOTE: This sheet is a summary. It may not cover all possible information. If you have questions about this medicine, talk to your doctor, pharmacist, or health care provider.  2020 Elsevier/Gold Standard (2019-03-11 15:35:11)  

## 2020-01-03 ENCOUNTER — Other Ambulatory Visit: Payer: Self-pay | Admitting: Podiatry

## 2020-01-03 MED ORDER — TERBINAFINE HCL 250 MG PO TABS: 250.0000 mg | ORAL_TABLET | Freq: Every day | ORAL | 0 refills | Status: DC

## 2020-01-03 NOTE — Progress Notes (Signed)
Subjective: 79 year old female presents the office today for follow-up evaluation of right foot pain.  She states that her foot feels better and she had improvement.  She did have some previous redness of the right third toe that is improved but she notes her nails become thickened discolored.  She said no recent treatment for the nails.  Denies any drainage or pus coming from the toenail sites. Denies any systemic complaints such as fevers, chills, nausea, vomiting. No acute changes since last appointment, and no other complaints at this time.   Objective: AAO x3, NAD DP/PT pulses palpable bilaterally, CRT less than 3 seconds No significant discomfort on the right foot today.  The nails are hypertrophic, dystrophic with yellow-brown discoloration on the right side worse than left.  There is no edema, erythema, drainage or pus or any signs of infection noted today. No open lesions or pre-ulcerative lesions.  No pain with calf compression, swelling, warmth, erythema  Assessment: Onychomycosis with resolving right foot pain  Plan: -All treatment options discussed with the patient including all alternatives, risks, complications.  -X-rays obtained reviewed.  No evidence of acute fracture or stress fracture.  Continue supportive shoes. -Discussed nail fungus treatments.  After discussion elects to proceed with oral Lamisil.  Risks and benefits were discussed with this.  We will check a CBC and LFT prior to starting the medication. -Patient encouraged to call the office with any questions, concerns, change in symptoms.   Return in about 6 weeks (around 02/13/2020) for nail fungus;lamisil check .  Trula Slade DPM

## 2020-01-04 ENCOUNTER — Ambulatory Visit: Payer: PPO | Attending: Internal Medicine

## 2020-01-04 DIAGNOSIS — Z23 Encounter for immunization: Secondary | ICD-10-CM

## 2020-01-04 NOTE — Progress Notes (Signed)
   Covid-19 Vaccination Clinic  Name:  [name]    MRN: [id number] DOB: 1941/06/01  01/04/2020  Ms. [name] was observed post Covid-19 immunization for 15 minutes without incidence. She was provided with Vaccine Information Sheet and instruction to access the V-Safe system.   Ms. [name] was instructed to call 911 with any severe reactions post vaccine: Marland Kitchen Difficulty breathing  . Swelling of your face and throat  . A fast heartbeat  . A bad rash all over your body  . Dizziness and weakness    Immunizations Administered    Name Date Dose VIS Date Route   Pfizer COVID-19 Vaccine 01/04/2020 10:30 AM 0.3 mL 11/25/2019 Intramuscular   Manufacturer: [place]   Lot: [id number]   [place]: [id number]

## 2020-01-05 DIAGNOSIS — M79641 Pain in right hand: Secondary | ICD-10-CM | POA: Diagnosis not present

## 2020-01-05 DIAGNOSIS — M65311 Trigger thumb, right thumb: Secondary | ICD-10-CM | POA: Diagnosis not present

## 2020-01-06 ENCOUNTER — Telehealth: Payer: Self-pay | Admitting: *Deleted

## 2020-01-06 NOTE — Telephone Encounter (Signed)
-----   Message from Trula Slade, DPM sent at 01/03/2020  6:41 AM EST ----- Val- please let her know that the blood work is normal and I have already sent over Lamisil to the [facility] for her. Follow up in 6 weeks.

## 2020-01-06 NOTE — Telephone Encounter (Signed)
Left message informing pt of Dr. Leigh Aurora review of results and that she could begin the medication called to the [facility], and to call me [phone] with questions, and [phone] for an appt.

## 2020-01-10 ENCOUNTER — Ambulatory Visit: Payer: PPO | Admitting: Internal Medicine

## 2020-01-10 ENCOUNTER — Encounter: Payer: Self-pay | Admitting: Internal Medicine

## 2020-01-10 ENCOUNTER — Telehealth: Payer: Self-pay | Admitting: *Deleted

## 2020-01-10 VITALS — BP 126/62 | HR 84 | Temp 97.4°F | Ht 65.0 in | Wt 134.0 lb

## 2020-01-10 DIAGNOSIS — K648 Other hemorrhoids: Secondary | ICD-10-CM | POA: Diagnosis not present

## 2020-01-10 NOTE — Progress Notes (Signed)
[name] is a 79 year old female with a history of chronic constipation, internal hemorrhoids and anal fissure who is here for follow-up. I saw her on 12/06/2019 at which time she had both anal fissure and internal hemorrhoids. We treated anal fissure with diltiazem gel and I asked that she be more consistent with Citrucel daily to help alternating bowel habits  Her anorectal pain has resolved with diltiazem therapy.  Stools are more consistent with Citrucel which she is using daily.  She has been happy with the response  She is interested in hemorrhoidal banding for chronic internal hemorrhoids which can intermittently bleed, prolapse and it should   PROCEDURE NOTE:  The patient presents with symptomatic grade 2 internal hemorrhoids, requesting rubber band ligation of her hemorrhoidal disease.  All risks, benefits and alternative forms of therapy were described and informed consent was obtained.   The anorectum was pre-medicated with 0.125% nitroglycerin ointment The decision was made to band the LL internal hemorrhoid, and the [place] was used to perform band ligation without complication.   Digital anorectal examination was then performed to assure proper positioning of the band, and to adjust the banded tissue as required.  The patient was discharged home without pain or other issues.  Dietary and behavioral recommendations were given and along with follow-up instructions.     The following adjunctive treatments were recommended: Continue Citrucel daily  The patient will return as scheduled for  follow-up and possible additional banding as required. No complications were encountered and the patient tolerated the procedure well.

## 2020-01-10 NOTE — Patient Instructions (Signed)
If you are age 78 or older, your body mass index should be between 23-30. Your Body mass index is 22.3 kg/m. If this is out of the aforementioned range listed, please consider follow up with your Primary Care Provider.  If you are age 17 or younger, your body mass index should be between 19-25. Your Body mass index is 22.3 kg/m. If this is out of the aformentioned range listed, please consider follow up with your Primary Care Provider.    HEMORRHOID BANDING PROCEDURE    FOLLOW-UP CARE   1. The procedure you have had should have been relatively painless since the banding of the area involved does not have nerve endings and there is no pain sensation.  The rubber band cuts off the blood supply to the hemorrhoid and the band may fall off as soon as 48 hours after the banding (the band may occasionally be seen in the toilet bowl following a bowel movement). You may notice a temporary feeling of fullness in the rectum which should respond adequately to plain Tylenol or Motrin.  2. Following the banding, avoid strenuous exercise that evening and resume full activity the next day.  A sitz bath (soaking in a warm tub) or bidet is soothing, and can be useful for cleansing the area after bowel movements.     3. To avoid constipation, take two tablespoons of natural wheat bran, natural oat bran, flax, Benefiber or any over the counter fiber supplement and increase your water intake to 7-8 glasses daily.    4. Unless you have been prescribed anorectal medication, do not put anything inside your rectum for two weeks: No suppositories, enemas, fingers, etc.  5. Occasionally, you may have more bleeding than usual after the banding procedure.  This is often from the untreated hemorrhoids rather than the treated one.  Don't be concerned if there is a tablespoon or so of blood.  If there is more blood than this, lie flat with your bottom higher than your head and apply an ice pack to the area. If the bleeding  does not stop within a half an hour or if you feel faint, call our office at [phone] or go to the emergency room.  6. Problems are not common; however, if there is a substantial amount of bleeding, severe pain, chills, fever or difficulty passing urine (very rare) or other problems, you should call us at [phone] or report to the nearest emergency room.  7. Do not stay seated continuously for more than 2-3 hours for a day or two after the procedure.  Tighten your buttock muscles 10-15 times every two hours and take 10-15 deep breaths every 1-2 hours.  Do not spend more than a few minutes on the toilet if you cannot empty your bowel; instead re-visit the toilet at a later time.

## 2020-01-10 NOTE — Telephone Encounter (Signed)
I called pt and she states she will begin the terbinafine about 01/30/2020. I told her Dr. Jacqualyn Posey would want to see her about 6 weeks into her therapy, and I could transfer for her to cancel the 02/13/2020 appt and reschedule about 6 weeks after the 01/30/2020 timeframe.

## 2020-01-10 NOTE — Telephone Encounter (Signed)
Pt states she will not be beginning her medication until mid-February.

## 2020-01-24 ENCOUNTER — Encounter: Payer: Self-pay | Admitting: Internal Medicine

## 2020-01-25 ENCOUNTER — Ambulatory Visit: Payer: PPO | Attending: Internal Medicine

## 2020-01-25 DIAGNOSIS — Z23 Encounter for immunization: Secondary | ICD-10-CM | POA: Insufficient documentation

## 2020-01-25 NOTE — Progress Notes (Signed)
   Covid-19 Vaccination Clinic  Name:  [name]    MRN: [id number] DOB: 1941/06/25  01/25/2020  Ms. Dyes was observed post Covid-19 immunization for 15 minutes without incidence. She was provided with Vaccine Information Sheet and instruction to access the V-Safe system.   [name] was instructed to call 911 with any severe reactions post vaccine: Marland Kitchen Difficulty breathing  . Swelling of your face and throat  . A fast heartbeat  . A bad rash all over your body  . Dizziness and weakness    Immunizations Administered    Name Date Dose VIS Date Route   Pfizer COVID-19 Vaccine 01/25/2020  2:00 PM 0.3 mL 11/25/2019 Intramuscular   Manufacturer: [place]   Lot: [id number]   [place]: [id number]

## 2020-01-31 DIAGNOSIS — H16223 Keratoconjunctivitis sicca, not specified as Sjogren's, bilateral: Secondary | ICD-10-CM | POA: Diagnosis not present

## 2020-01-31 DIAGNOSIS — H26491 Other secondary cataract, right eye: Secondary | ICD-10-CM | POA: Diagnosis not present

## 2020-01-31 DIAGNOSIS — H04123 Dry eye syndrome of bilateral lacrimal glands: Secondary | ICD-10-CM | POA: Diagnosis not present

## 2020-02-07 DIAGNOSIS — G43009 Migraine without aura, not intractable, without status migrainosus: Secondary | ICD-10-CM | POA: Diagnosis not present

## 2020-02-13 ENCOUNTER — Ambulatory Visit: Payer: PPO | Admitting: Podiatry

## 2020-02-14 DIAGNOSIS — E782 Mixed hyperlipidemia: Secondary | ICD-10-CM | POA: Diagnosis not present

## 2020-02-14 DIAGNOSIS — I10 Essential (primary) hypertension: Secondary | ICD-10-CM | POA: Diagnosis not present

## 2020-02-14 DIAGNOSIS — F3341 Major depressive disorder, recurrent, in partial remission: Secondary | ICD-10-CM | POA: Diagnosis not present

## 2020-02-14 DIAGNOSIS — G43009 Migraine without aura, not intractable, without status migrainosus: Secondary | ICD-10-CM | POA: Diagnosis not present

## 2020-02-16 ENCOUNTER — Other Ambulatory Visit: Payer: Self-pay | Admitting: Family Medicine

## 2020-02-16 DIAGNOSIS — E559 Vitamin D deficiency, unspecified: Secondary | ICD-10-CM

## 2020-02-16 DIAGNOSIS — E782 Mixed hyperlipidemia: Secondary | ICD-10-CM | POA: Diagnosis not present

## 2020-02-16 DIAGNOSIS — E2839 Other primary ovarian failure: Secondary | ICD-10-CM | POA: Diagnosis not present

## 2020-02-16 DIAGNOSIS — I10 Essential (primary) hypertension: Secondary | ICD-10-CM | POA: Diagnosis not present

## 2020-02-16 DIAGNOSIS — M65311 Trigger thumb, right thumb: Secondary | ICD-10-CM | POA: Diagnosis not present

## 2020-02-16 DIAGNOSIS — R232 Flushing: Secondary | ICD-10-CM | POA: Diagnosis not present

## 2020-02-16 DIAGNOSIS — M8580 Other specified disorders of bone density and structure, unspecified site: Secondary | ICD-10-CM | POA: Diagnosis not present

## 2020-02-16 DIAGNOSIS — M79641 Pain in right hand: Secondary | ICD-10-CM | POA: Diagnosis not present

## 2020-02-16 DIAGNOSIS — R252 Cramp and spasm: Secondary | ICD-10-CM | POA: Diagnosis not present

## 2020-02-24 ENCOUNTER — Telehealth: Payer: Self-pay | Admitting: *Deleted

## 2020-02-24 NOTE — Telephone Encounter (Signed)
Patient is wanting to inform that things are going smoother now and she is about to start taking medication(Lamisil). Wanted to give an update to Mateo Flow since she had spoken to her earlier.

## 2020-02-24 NOTE — Telephone Encounter (Signed)
Just an FYI. Lattie Haw

## 2020-02-26 DIAGNOSIS — R197 Diarrhea, unspecified: Secondary | ICD-10-CM | POA: Diagnosis not present

## 2020-02-26 DIAGNOSIS — R1032 Left lower quadrant pain: Secondary | ICD-10-CM | POA: Diagnosis not present

## 2020-02-28 NOTE — Telephone Encounter (Signed)
Great. I would like to recheck CBC and LFT about half way through treatment to make sure blood counts are staying normal. Thanks.

## 2020-02-29 ENCOUNTER — Other Ambulatory Visit: Payer: Self-pay | Admitting: *Deleted

## 2020-02-29 DIAGNOSIS — Z79899 Other long term (current) drug therapy: Secondary | ICD-10-CM

## 2020-02-29 NOTE — Telephone Encounter (Signed)
I called and left a message for the patient and I stated to call the office if any concerns or questions and I mailed the blood work and the directions to the patient today. [name]

## 2020-03-06 ENCOUNTER — Ambulatory Visit: Payer: PPO | Admitting: Family Medicine

## 2020-03-12 ENCOUNTER — Telehealth: Payer: Self-pay | Admitting: Gastroenterology

## 2020-03-12 NOTE — Telephone Encounter (Signed)
Left message on machine to call back  

## 2020-03-13 NOTE — Telephone Encounter (Signed)
Left message on machine to call back  

## 2020-03-14 NOTE — Telephone Encounter (Signed)
I spoke with the pt and she tells me that she had an episdoe of vomiting and diarrhea for a couple weeks but she has gotten better.  The vomiting has resolved and the diarrhea has also slowed to 2-3 movements daily that is relieved with imodium.  She saw PCP and was told she may have had a GI bug. She was advised to call back if she did not continue to improve and offered an appt with Dr Ardis Hughs. She has an appt with Dr Hilarie Fredrickson for banding on 4/8 and says she will keep that appt and if she worsens she will call back to make an appt with Dr Ardis Hughs

## 2020-03-20 DIAGNOSIS — M25551 Pain in right hip: Secondary | ICD-10-CM | POA: Diagnosis not present

## 2020-03-22 ENCOUNTER — Encounter: Payer: Self-pay | Admitting: Internal Medicine

## 2020-03-22 ENCOUNTER — Ambulatory Visit (INDEPENDENT_AMBULATORY_CARE_PROVIDER_SITE_OTHER): Payer: PPO | Admitting: Internal Medicine

## 2020-03-22 VITALS — BP 140/66 | HR 84 | Temp 98.0°F | Ht 64.5 in | Wt 133.4 lb

## 2020-03-22 DIAGNOSIS — K648 Other hemorrhoids: Secondary | ICD-10-CM

## 2020-03-22 DIAGNOSIS — R197 Diarrhea, unspecified: Secondary | ICD-10-CM | POA: Diagnosis not present

## 2020-03-22 DIAGNOSIS — R1030 Lower abdominal pain, unspecified: Secondary | ICD-10-CM

## 2020-03-22 DIAGNOSIS — K5909 Other constipation: Secondary | ICD-10-CM | POA: Diagnosis not present

## 2020-03-22 NOTE — Patient Instructions (Addendum)
If you are age 78 or older, your body mass index should be between 23-30. Your Body mass index is 22.54 kg/m. If this is out of the aforementioned range listed, please consider follow up with your Primary Care Provider.  If you are age 68 or younger, your body mass index should be between 19-25. Your Body mass index is 22.54 kg/m. If this is out of the aformentioned range listed, please consider follow up with your Primary Care Provider.   Please purchase the following medications over the counter and take as directed:  START: Miralax 17grams daily mixed with liquid.  Please continue Citrucel.  Please call our office to let us know if you will need 3rd banding or if bowel habits change.  HEMORRHOID BANDING PROCEDURE    FOLLOW-UP CARE   1. The procedure you have had should have been relatively painless since the banding of the area involved does not have nerve endings and there is no pain sensation.  The rubber band cuts off the blood supply to the hemorrhoid and the band may fall off as soon as 48 hours after the banding (the band may occasionally be seen in the toilet bowl following a bowel movement). You may notice a temporary feeling of fullness in the rectum which should respond adequately to plain Tylenol or Motrin.  2. Following the banding, avoid strenuous exercise that evening and resume full activity the next day.  A sitz bath (soaking in a warm tub) or bidet is soothing, and can be useful for cleansing the area after bowel movements.     3. To avoid constipation, take two tablespoons of natural wheat bran, natural oat bran, flax, Benefiber or any over the counter fiber supplement and increase your water intake to 7-8 glasses daily.    4. Unless you have been prescribed anorectal medication, do not put anything inside your rectum for two weeks: No suppositories, enemas, fingers, etc.  5. Occasionally, you may have more bleeding than usual after the banding procedure.  This is  often from the untreated hemorrhoids rather than the treated one.  Don't be concerned if there is a tablespoon or so of blood.  If there is more blood than this, lie flat with your bottom higher than your head and apply an ice pack to the area. If the bleeding does not stop within a half an hour or if you feel faint, call our office at [phone] or go to the emergency room.  6. Problems are not common; however, if there is a substantial amount of bleeding, severe pain, chills, fever or difficulty passing urine (very rare) or other problems, you should call us at [phone] or report to the nearest emergency room.  7. Do not stay seated continuously for more than 2-3 hours for a day or two after the procedure.  Tighten your buttock muscles 10-15 times every two hours and take 10-15 deep breaths every 1-2 hours.  Do not spend more than a few minutes on the toilet if you cannot empty your bowel; instead re-visit the toilet at a later time.      Thank you, Dr Zenovia Jarred

## 2020-03-22 NOTE — Progress Notes (Signed)
[name] is a 79 year old female with a history of chronic constipation, internal hemorrhoids and anal fissure who is here for follow-up.  She is here alone today.  She returns after a visit on 01/10/2020 where we performed hemorrhoidal banding to the left lateral internal hemorrhoid.  She reports this worked well and her hemorrhoidal prolapse and bleeding have resolved.  She however continues to struggle with constipation which will last for several days followed by several days of diarrhea.  There is associated lower abdominal cramping discomfort.  She will occasionally have incomplete defecation symptom as well as bloating.  She is using Citrucel and stool softeners.  Current Medications, Allergies, Past Medical History, Past Surgical History, Family History and Social History were reviewed in [employer] record.   BP 140/66 (BP Location: Left Arm, Patient Position: Sitting, Cuff Size: Normal)   Pulse 84   Temp 98 F (36.7 C)   Ht 5' 4.5" (1.638 m)   Wt 133 lb 6 oz (60.5 kg)   BMI 22.54 kg/m  Gen: awake, alert, NAD HEENT: anicteric CV: RRR, no mrg Pulm: CTA b/l Abd: soft, NT/ND, +BS throughout Ext: no c/c/e Neuro: nonfocal  1.  Symptomatic internal hemorrhoids (intermittent bleeding, prolapse and perianal itch) --Repeat banding today  PROCEDURE NOTE:  The patient presents with symptomatic grade 2 internal hemorrhoids, requesting rubber band ligation of  her hemorrhoidal disease.  All risks, benefits and alternative forms of therapy were described and informed consent was obtained.   The anorectum was pre-medicated with 0.125% nitroglycerin ointment The decision was made to band the RA internal hemorrhoid, and the [place] was used to perform band ligation without complication.   Digital anorectal examination was then performed to assure proper positioning of the band, and to adjust the banded tissue as required.  The patient was  discharged home without pain or other issues.  Dietary and behavioral recommendations were given and along with follow-up instructions.    2.  Alternating bowel habits, constipation predominance --she is having predominantly constipation leading to lower abdominal pain and incomplete defecation.  This may also be related to her colonic diverticulosis though there is no clinical evidence of diverticulitis.  Her loose stools and diarrhea are likely overflow once she is able to have a bowel movement and then the cycle starts over again.  We discussed needing to break this cycle by continuing Citrucel but adding MiraLAX. --Citrucel 1-2 heaping tablespoons daily --MiraLAX 17 g daily; avoiding constipation should improve both constipation and overflow diarrhea --Return office visit with Dr. Ardis Hughs if bowel habits continue to be troublesome including if she continues to have lower abdominal pain  The patient will return as needed for  follow-up and possible additional banding as required. No complications were encountered and the patient tolerated the procedure well.

## 2020-03-28 DIAGNOSIS — W57XXXA Bitten or stung by nonvenomous insect and other nonvenomous arthropods, initial encounter: Secondary | ICD-10-CM | POA: Diagnosis not present

## 2020-03-28 DIAGNOSIS — S30861A Insect bite (nonvenomous) of abdominal wall, initial encounter: Secondary | ICD-10-CM | POA: Diagnosis not present

## 2020-04-02 DIAGNOSIS — E782 Mixed hyperlipidemia: Secondary | ICD-10-CM | POA: Diagnosis not present

## 2020-04-02 DIAGNOSIS — F3341 Major depressive disorder, recurrent, in partial remission: Secondary | ICD-10-CM | POA: Diagnosis not present

## 2020-04-02 DIAGNOSIS — G43009 Migraine without aura, not intractable, without status migrainosus: Secondary | ICD-10-CM | POA: Diagnosis not present

## 2020-04-02 DIAGNOSIS — I10 Essential (primary) hypertension: Secondary | ICD-10-CM | POA: Diagnosis not present

## 2020-04-24 DIAGNOSIS — H16223 Keratoconjunctivitis sicca, not specified as Sjogren's, bilateral: Secondary | ICD-10-CM | POA: Diagnosis not present

## 2020-04-24 DIAGNOSIS — H04123 Dry eye syndrome of bilateral lacrimal glands: Secondary | ICD-10-CM | POA: Diagnosis not present

## 2020-05-09 ENCOUNTER — Other Ambulatory Visit: Payer: Self-pay

## 2020-05-09 ENCOUNTER — Ambulatory Visit
Admission: RE | Admit: 2020-05-09 | Discharge: 2020-05-09 | Disposition: A | Discharge status: Home / Self Care | Payer: PPO | Source: Ambulatory Visit | Attending: Family Medicine | Admitting: Family Medicine

## 2020-05-09 DIAGNOSIS — M8580 Other specified disorders of bone density and structure, unspecified site: Secondary | ICD-10-CM

## 2020-05-09 DIAGNOSIS — M8589 Other specified disorders of bone density and structure, multiple sites: Secondary | ICD-10-CM | POA: Diagnosis not present

## 2020-05-09 DIAGNOSIS — E559 Vitamin D deficiency, unspecified: Secondary | ICD-10-CM

## 2020-05-09 DIAGNOSIS — Z780 Asymptomatic menopausal state: Secondary | ICD-10-CM | POA: Diagnosis not present

## 2020-05-09 DIAGNOSIS — E2839 Other primary ovarian failure: Secondary | ICD-10-CM

## 2020-05-11 DIAGNOSIS — G43009 Migraine without aura, not intractable, without status migrainosus: Secondary | ICD-10-CM | POA: Diagnosis not present

## 2020-05-11 DIAGNOSIS — I10 Essential (primary) hypertension: Secondary | ICD-10-CM | POA: Diagnosis not present

## 2020-05-11 DIAGNOSIS — E782 Mixed hyperlipidemia: Secondary | ICD-10-CM | POA: Diagnosis not present

## 2020-05-11 DIAGNOSIS — F3341 Major depressive disorder, recurrent, in partial remission: Secondary | ICD-10-CM | POA: Diagnosis not present

## 2020-05-18 DIAGNOSIS — R21 Rash and other nonspecific skin eruption: Secondary | ICD-10-CM | POA: Diagnosis not present

## 2020-05-23 DIAGNOSIS — D225 Melanocytic nevi of trunk: Secondary | ICD-10-CM | POA: Diagnosis not present

## 2020-05-23 DIAGNOSIS — D485 Neoplasm of uncertain behavior of skin: Secondary | ICD-10-CM | POA: Diagnosis not present

## 2020-05-23 DIAGNOSIS — L578 Other skin changes due to chronic exposure to nonionizing radiation: Secondary | ICD-10-CM | POA: Diagnosis not present

## 2020-05-23 DIAGNOSIS — L814 Other melanin hyperpigmentation: Secondary | ICD-10-CM | POA: Diagnosis not present

## 2020-05-23 DIAGNOSIS — L821 Other seborrheic keratosis: Secondary | ICD-10-CM | POA: Diagnosis not present

## 2020-05-23 DIAGNOSIS — L720 Epidermal cyst: Secondary | ICD-10-CM | POA: Diagnosis not present

## 2020-05-23 DIAGNOSIS — L281 Prurigo nodularis: Secondary | ICD-10-CM | POA: Diagnosis not present

## 2020-05-24 DIAGNOSIS — M79641 Pain in right hand: Secondary | ICD-10-CM | POA: Diagnosis not present

## 2020-05-24 DIAGNOSIS — M65311 Trigger thumb, right thumb: Secondary | ICD-10-CM | POA: Diagnosis not present

## 2020-06-07 DIAGNOSIS — M65312 Trigger thumb, left thumb: Secondary | ICD-10-CM | POA: Diagnosis not present

## 2020-06-07 DIAGNOSIS — Z4789 Encounter for other orthopedic aftercare: Secondary | ICD-10-CM | POA: Diagnosis not present

## 2020-07-05 DIAGNOSIS — E782 Mixed hyperlipidemia: Secondary | ICD-10-CM | POA: Diagnosis not present

## 2020-07-05 DIAGNOSIS — F3341 Major depressive disorder, recurrent, in partial remission: Secondary | ICD-10-CM | POA: Diagnosis not present

## 2020-07-05 DIAGNOSIS — G43009 Migraine without aura, not intractable, without status migrainosus: Secondary | ICD-10-CM | POA: Diagnosis not present

## 2020-07-05 DIAGNOSIS — I10 Essential (primary) hypertension: Secondary | ICD-10-CM | POA: Diagnosis not present

## 2020-07-14 ENCOUNTER — Emergency Department (HOSPITAL_BASED_OUTPATIENT_CLINIC_OR_DEPARTMENT_OTHER)
Admission: EM | Admit: 2020-07-14 | Discharge: 2020-07-14 | Disposition: A | Discharge status: Home / Self Care | Payer: PPO | Attending: Emergency Medicine | Admitting: Emergency Medicine

## 2020-07-14 ENCOUNTER — Other Ambulatory Visit: Payer: Self-pay

## 2020-07-14 ENCOUNTER — Encounter (HOSPITAL_BASED_OUTPATIENT_CLINIC_OR_DEPARTMENT_OTHER): Payer: Self-pay | Admitting: Emergency Medicine

## 2020-07-14 DIAGNOSIS — Z5321 Procedure and treatment not carried out due to patient leaving prior to being seen by health care provider: Secondary | ICD-10-CM | POA: Insufficient documentation

## 2020-07-14 DIAGNOSIS — M542 Cervicalgia: Secondary | ICD-10-CM | POA: Insufficient documentation

## 2020-07-14 NOTE — ED Triage Notes (Signed)
Neck pain x 1 week. Hurts worse when turning to the left. Denies injury.

## 2020-07-17 ENCOUNTER — Ambulatory Visit: Payer: PPO | Admitting: Family Medicine

## 2020-07-17 ENCOUNTER — Encounter: Payer: Self-pay | Admitting: Family Medicine

## 2020-07-17 ENCOUNTER — Other Ambulatory Visit: Payer: Self-pay

## 2020-07-17 VITALS — BP 124/62 | HR 69 | Ht 65.0 in | Wt 138.8 lb

## 2020-07-17 DIAGNOSIS — M62838 Other muscle spasm: Secondary | ICD-10-CM | POA: Diagnosis not present

## 2020-07-17 MED ORDER — METHOCARBAMOL 500 MG PO TABS: 500.0000 mg | ORAL_TABLET | Freq: Three times a day (TID) | ORAL | 1 refills | Status: DC | PRN

## 2020-07-17 NOTE — Progress Notes (Signed)
    Subjective:    CC: neck pain  I, Molly Weber, LAT, ATC, am serving as scribe for Dr. Lynne Leader.  HPI: Pt is a 79 y/o female presenting w/ c/o neck pain x approximately 2 weeks that worsened over the weekend.  She was most recently seen at the [facility] on 07/14/20 for her neck pain.  She locates her pain to her L lateral/post neck   Radiating pain: yes down her back along the medial aspect of her L scapula UE numbness/tingling: No Aggravating factors: L cervical rotation; L sidebending; cervical ext Treatments tried: Biofreeze; IBU  Pertinent review of Systems: No fevers or chills  Relevant historical information: Hypertension, ulcerative colitis history   Objective:    Vitals:   07/17/20 1241  BP: 124/62  Pulse: 69  SpO2: 97%   General: Well Developed, well nourished, and in no acute distress.   MSK: C-spine normal-appearing Nontender midline. Tender palpation left cervical paraspinal musculature and trapezius. Decreased cervical motion. Upper extremity reflexes and sensation and strength are equal normal throughout. Pulses cap refill and sensation are intact distally.     Impression and Recommendations:    Assessment and Plan: 79 y.o. female with left cervical paraspinal muscle spasm and dysfunction.  Plan for heating pad TENS unit physical therapy.  Will prescribe Robaxin as she has had some benefit with that in the past.  Recheck back with me if not improving.  PDMP not reviewed this encounter. Orders Placed This Encounter  Procedures  . Ambulatory referral to Physical Therapy    Referral Priority:   Routine    Referral Type:   Physical Medicine    Referral Reason:   Specialty Services Required    Requested Specialty:   Physical Therapy    Number of Visits Requested:   1   Meds ordered this encounter  Medications  . methocarbamol (ROBAXIN) 500 MG tablet    Sig: Take 1 tablet (500 mg total) by mouth every 8 (eight) hours as needed  for muscle spasms. Take mostly at bedtime    Dispense:  60 tablet    Refill:  1    Discussed warning signs or symptoms. Please see discharge instructions. Patient expresses understanding.   The above documentation has been reviewed and is accurate and complete Lynne Leader, M.D.

## 2020-07-17 NOTE — Patient Instructions (Addendum)
Thank you for coming in today. Use heat.  Will plan for PT.  Continue ibuprofen for pain,  Add robaxin muscle relaxer as needed.  Try TENS unit.  Recheck if not improving.   TENS UNIT: This is helpful for muscle pain and spasm.   Search and Purchase a TENS 7000 2nd edition at  www.tenspros.com or www.[employer].com It should be less than $30.     TENS unit instructions: Do not shower or bathe with the unit on Turn the unit off before removing electrodes or batteries If the electrodes lose stickiness add a drop of water to the electrodes after they are disconnected from the unit and place on plastic sheet. If you continued to have difficulty, call the TENS unit company to purchase more electrodes. Do not apply lotion on the skin area prior to use. Make sure the skin is clean and dry as this will help prolong the life of the electrodes. After use, always check skin for unusual red areas, rash or other skin difficulties. If there are any skin problems, does not apply electrodes to the same area. Never remove the electrodes from the unit by pulling the wires. Do not use the TENS unit or electrodes other than as directed. Do not change electrode placement without consultating your therapist or physician. Keep 2 fingers with between each electrode. Wear time ratio is 2:1, on to off times.    For example on for 30 minutes off for 15 minutes and then on for 30 minutes off for 15 minutes     Cervical Strain and Sprain Rehab Ask your health care provider which exercises are safe for you. Do exercises exactly as told by your health care provider and adjust them as directed. It is normal to feel mild stretching, pulling, tightness, or discomfort as you do these exercises. Stop right away if you feel sudden pain or your pain gets worse. Do not begin these exercises until told by your health care provider. Stretching and range-of-motion exercises Cervical side bending  Using good posture, sit on  a stable chair or stand up. Without moving your shoulders, slowly tilt your left / right ear to your shoulder until you feel a stretch in the opposite side neck muscles. You should be looking straight ahead. Hold for __________ seconds. Repeat with the other side of your neck. Repeat __________ times. Complete this exercise __________ times a day. Cervical rotation  Using good posture, sit on a stable chair or stand up. Slowly turn your head to the side as if you are looking over your left / right shoulder. Keep your eyes level with the ground. Stop when you feel a stretch along the side and the back of your neck. Hold for __________ seconds. Repeat this by turning to your other side. Repeat __________ times. Complete this exercise __________ times a day. Thoracic extension and pectoral stretch Roll a towel or a small blanket so it is about 4 inches (10 cm) in diameter. Lie down on your back on a firm surface. Put the towel lengthwise, under your spine in the middle of your back. It should not be under your shoulder blades. The towel should line up with your spine from your middle back to your lower back. Put your hands behind your head and let your elbows fall out to your sides. Hold for __________ seconds. Repeat __________ times. Complete this exercise __________ times a day. Strengthening exercises Isometric upper cervical flexion Lie on your back with a thin pillow behind your  head and a small rolled-up towel under your neck. Gently tuck your chin toward your chest and nod your head down to look toward your feet. Do not lift your head off the pillow. Hold for __________ seconds. Release the tension slowly. Relax your neck muscles completely before you repeat this exercise. Repeat __________ times. Complete this exercise __________ times a day. Isometric cervical extension  Stand about 6 inches (15 cm) away from a wall, with your back facing the wall. Place a soft object, about 6-8  inches (15-20 cm) in diameter, between the back of your head and the wall. A soft object could be a small pillow, a ball, or a folded towel. Gently tilt your head back and press into the soft object. Keep your jaw and forehead relaxed. Hold for __________ seconds. Release the tension slowly. Relax your neck muscles completely before you repeat this exercise. Repeat __________ times. Complete this exercise __________ times a day. Posture and body mechanics Body mechanics refers to the movements and positions of your body while you do your daily activities. Posture is part of body mechanics. Good posture and healthy body mechanics can help to relieve stress in your body's tissues and joints. Good posture means that your spine is in its natural S-curve position (your spine is neutral), your shoulders are pulled back slightly, and your head is not tipped forward. The following are general guidelines for applying improved posture and body mechanics to your everyday activities. Sitting  When sitting, keep your spine neutral and keep your feet flat on the floor. Use a footrest, if necessary, and keep your thighs parallel to the floor. Avoid rounding your shoulders, and avoid tilting your head forward. When working at a desk or a computer, keep your desk at a height where your hands are slightly lower than your elbows. Slide your chair under your desk so you are close enough to maintain good posture. When working at a computer, place your monitor at a height where you are looking straight ahead and you do not have to tilt your head forward or downward to look at the screen. Standing  When standing, keep your spine neutral and keep your feet about hip-width apart. Keep a slight bend in your knees. Your ears, shoulders, and hips should line up. When you do a task in which you stand in one place for a long time, place one foot up on a stable object that is 2-4 inches (5-10 cm) high, such as a footstool. This  helps keep your spine neutral. Resting When lying down and resting, avoid positions that are most painful for you. Try to support your neck in a neutral position. You can use a contour pillow or a small rolled-up towel. Your pillow should support your neck but not push on it. This information is not intended to replace advice given to you by your health care provider. Make sure you discuss any questions you have with your health care provider. Document Revised: 03/23/2019 Document Reviewed: 09/01/2018 Elsevier Patient Education  [place].

## 2020-07-24 DIAGNOSIS — M9901 Segmental and somatic dysfunction of cervical region: Secondary | ICD-10-CM | POA: Diagnosis not present

## 2020-07-24 DIAGNOSIS — M542 Cervicalgia: Secondary | ICD-10-CM | POA: Diagnosis not present

## 2020-07-24 DIAGNOSIS — M5412 Radiculopathy, cervical region: Secondary | ICD-10-CM | POA: Diagnosis not present

## 2020-07-24 DIAGNOSIS — M9902 Segmental and somatic dysfunction of thoracic region: Secondary | ICD-10-CM | POA: Diagnosis not present

## 2020-07-26 DIAGNOSIS — M542 Cervicalgia: Secondary | ICD-10-CM | POA: Diagnosis not present

## 2020-07-26 DIAGNOSIS — T63301A Toxic effect of unspecified spider venom, accidental (unintentional), initial encounter: Secondary | ICD-10-CM | POA: Diagnosis not present

## 2020-07-26 DIAGNOSIS — S40862A Insect bite (nonvenomous) of left upper arm, initial encounter: Secondary | ICD-10-CM | POA: Diagnosis not present

## 2020-08-01 DIAGNOSIS — M542 Cervicalgia: Secondary | ICD-10-CM | POA: Diagnosis not present

## 2020-08-06 DIAGNOSIS — M542 Cervicalgia: Secondary | ICD-10-CM | POA: Diagnosis not present

## 2020-08-09 DIAGNOSIS — M542 Cervicalgia: Secondary | ICD-10-CM | POA: Diagnosis not present

## 2020-08-14 DIAGNOSIS — M542 Cervicalgia: Secondary | ICD-10-CM | POA: Diagnosis not present

## 2020-08-21 DIAGNOSIS — M542 Cervicalgia: Secondary | ICD-10-CM | POA: Diagnosis not present

## 2020-08-22 DIAGNOSIS — F3341 Major depressive disorder, recurrent, in partial remission: Secondary | ICD-10-CM | POA: Diagnosis not present

## 2020-08-22 DIAGNOSIS — E782 Mixed hyperlipidemia: Secondary | ICD-10-CM | POA: Diagnosis not present

## 2020-08-22 DIAGNOSIS — I10 Essential (primary) hypertension: Secondary | ICD-10-CM | POA: Diagnosis not present

## 2020-08-22 DIAGNOSIS — K219 Gastro-esophageal reflux disease without esophagitis: Secondary | ICD-10-CM | POA: Diagnosis not present

## 2020-08-22 DIAGNOSIS — Z0000 Encounter for general adult medical examination without abnormal findings: Secondary | ICD-10-CM | POA: Diagnosis not present

## 2020-08-22 DIAGNOSIS — F411 Generalized anxiety disorder: Secondary | ICD-10-CM | POA: Diagnosis not present

## 2020-08-22 DIAGNOSIS — R739 Hyperglycemia, unspecified: Secondary | ICD-10-CM | POA: Diagnosis not present

## 2020-08-22 DIAGNOSIS — E559 Vitamin D deficiency, unspecified: Secondary | ICD-10-CM | POA: Diagnosis not present

## 2020-09-07 ENCOUNTER — Ambulatory Visit: Payer: PPO | Admitting: Physical Therapy

## 2020-09-17 ENCOUNTER — Ambulatory Visit: Payer: PPO | Admitting: Physical Therapy

## 2020-09-17 DIAGNOSIS — H353111 Nonexudative age-related macular degeneration, right eye, early dry stage: Secondary | ICD-10-CM | POA: Diagnosis not present

## 2020-09-17 DIAGNOSIS — H26491 Other secondary cataract, right eye: Secondary | ICD-10-CM | POA: Diagnosis not present

## 2020-09-17 DIAGNOSIS — H04123 Dry eye syndrome of bilateral lacrimal glands: Secondary | ICD-10-CM | POA: Diagnosis not present

## 2020-09-17 DIAGNOSIS — H353122 Nonexudative age-related macular degeneration, left eye, intermediate dry stage: Secondary | ICD-10-CM | POA: Diagnosis not present

## 2020-09-21 ENCOUNTER — Ambulatory Visit: Payer: PPO

## 2020-09-25 ENCOUNTER — Encounter: Payer: PPO | Admitting: Physical Therapy

## 2020-09-25 DIAGNOSIS — F3341 Major depressive disorder, recurrent, in partial remission: Secondary | ICD-10-CM | POA: Diagnosis not present

## 2020-09-25 DIAGNOSIS — G43009 Migraine without aura, not intractable, without status migrainosus: Secondary | ICD-10-CM | POA: Diagnosis not present

## 2020-09-25 DIAGNOSIS — I10 Essential (primary) hypertension: Secondary | ICD-10-CM | POA: Diagnosis not present

## 2020-09-25 DIAGNOSIS — E782 Mixed hyperlipidemia: Secondary | ICD-10-CM | POA: Diagnosis not present

## 2020-09-27 DIAGNOSIS — T63304A Toxic effect of unspecified spider venom, undetermined, initial encounter: Secondary | ICD-10-CM | POA: Diagnosis not present

## 2020-10-01 ENCOUNTER — Encounter: Payer: PPO | Admitting: Physical Therapy

## 2020-10-22 DIAGNOSIS — G43009 Migraine without aura, not intractable, without status migrainosus: Secondary | ICD-10-CM | POA: Diagnosis not present

## 2020-10-22 DIAGNOSIS — K219 Gastro-esophageal reflux disease without esophagitis: Secondary | ICD-10-CM | POA: Diagnosis not present

## 2020-10-22 DIAGNOSIS — F3341 Major depressive disorder, recurrent, in partial remission: Secondary | ICD-10-CM | POA: Diagnosis not present

## 2020-10-22 DIAGNOSIS — G8929 Other chronic pain: Secondary | ICD-10-CM | POA: Diagnosis not present

## 2020-10-22 DIAGNOSIS — I10 Essential (primary) hypertension: Secondary | ICD-10-CM | POA: Diagnosis not present

## 2020-10-22 DIAGNOSIS — G4700 Insomnia, unspecified: Secondary | ICD-10-CM | POA: Diagnosis not present

## 2020-10-22 DIAGNOSIS — E782 Mixed hyperlipidemia: Secondary | ICD-10-CM | POA: Diagnosis not present

## 2020-11-01 DIAGNOSIS — B078 Other viral warts: Secondary | ICD-10-CM | POA: Diagnosis not present

## 2020-11-05 DIAGNOSIS — Z1231 Encounter for screening mammogram for malignant neoplasm of breast: Secondary | ICD-10-CM | POA: Diagnosis not present

## 2020-11-05 DIAGNOSIS — N898 Other specified noninflammatory disorders of vagina: Secondary | ICD-10-CM | POA: Diagnosis not present

## 2020-11-05 DIAGNOSIS — N890 Mild vaginal dysplasia: Secondary | ICD-10-CM | POA: Diagnosis not present

## 2020-11-05 DIAGNOSIS — Z01419 Encounter for gynecological examination (general) (routine) without abnormal findings: Secondary | ICD-10-CM | POA: Diagnosis not present

## 2020-11-06 DIAGNOSIS — N890 Mild vaginal dysplasia: Secondary | ICD-10-CM | POA: Diagnosis not present

## 2020-11-14 DIAGNOSIS — H9202 Otalgia, left ear: Secondary | ICD-10-CM | POA: Diagnosis not present

## 2020-11-14 DIAGNOSIS — H6980 Other specified disorders of Eustachian tube, unspecified ear: Secondary | ICD-10-CM | POA: Diagnosis not present

## 2021-01-10 DIAGNOSIS — F3341 Major depressive disorder, recurrent, in partial remission: Secondary | ICD-10-CM | POA: Diagnosis not present

## 2021-01-10 DIAGNOSIS — G43009 Migraine without aura, not intractable, without status migrainosus: Secondary | ICD-10-CM | POA: Diagnosis not present

## 2021-01-10 DIAGNOSIS — K219 Gastro-esophageal reflux disease without esophagitis: Secondary | ICD-10-CM | POA: Diagnosis not present

## 2021-01-10 DIAGNOSIS — G8929 Other chronic pain: Secondary | ICD-10-CM | POA: Diagnosis not present

## 2021-01-10 DIAGNOSIS — I10 Essential (primary) hypertension: Secondary | ICD-10-CM | POA: Diagnosis not present

## 2021-01-10 DIAGNOSIS — G4700 Insomnia, unspecified: Secondary | ICD-10-CM | POA: Diagnosis not present

## 2021-01-10 DIAGNOSIS — E782 Mixed hyperlipidemia: Secondary | ICD-10-CM | POA: Diagnosis not present

## 2021-01-23 DIAGNOSIS — G8929 Other chronic pain: Secondary | ICD-10-CM | POA: Diagnosis not present

## 2021-01-23 DIAGNOSIS — F3341 Major depressive disorder, recurrent, in partial remission: Secondary | ICD-10-CM | POA: Diagnosis not present

## 2021-01-23 DIAGNOSIS — G43009 Migraine without aura, not intractable, without status migrainosus: Secondary | ICD-10-CM | POA: Diagnosis not present

## 2021-01-23 DIAGNOSIS — E782 Mixed hyperlipidemia: Secondary | ICD-10-CM | POA: Diagnosis not present

## 2021-01-23 DIAGNOSIS — G4700 Insomnia, unspecified: Secondary | ICD-10-CM | POA: Diagnosis not present

## 2021-01-23 DIAGNOSIS — I10 Essential (primary) hypertension: Secondary | ICD-10-CM | POA: Diagnosis not present

## 2021-01-23 DIAGNOSIS — K219 Gastro-esophageal reflux disease without esophagitis: Secondary | ICD-10-CM | POA: Diagnosis not present

## 2021-01-29 DIAGNOSIS — N952 Postmenopausal atrophic vaginitis: Secondary | ICD-10-CM | POA: Diagnosis not present

## 2021-02-07 DIAGNOSIS — R112 Nausea with vomiting, unspecified: Secondary | ICD-10-CM | POA: Diagnosis not present

## 2021-02-07 DIAGNOSIS — R21 Rash and other nonspecific skin eruption: Secondary | ICD-10-CM | POA: Diagnosis not present

## 2021-02-19 DIAGNOSIS — G4700 Insomnia, unspecified: Secondary | ICD-10-CM | POA: Diagnosis not present

## 2021-02-19 DIAGNOSIS — G8929 Other chronic pain: Secondary | ICD-10-CM | POA: Diagnosis not present

## 2021-02-19 DIAGNOSIS — K219 Gastro-esophageal reflux disease without esophagitis: Secondary | ICD-10-CM | POA: Diagnosis not present

## 2021-02-19 DIAGNOSIS — E782 Mixed hyperlipidemia: Secondary | ICD-10-CM | POA: Diagnosis not present

## 2021-02-19 DIAGNOSIS — G43009 Migraine without aura, not intractable, without status migrainosus: Secondary | ICD-10-CM | POA: Diagnosis not present

## 2021-02-19 DIAGNOSIS — I10 Essential (primary) hypertension: Secondary | ICD-10-CM | POA: Diagnosis not present

## 2021-02-19 DIAGNOSIS — F3341 Major depressive disorder, recurrent, in partial remission: Secondary | ICD-10-CM | POA: Diagnosis not present

## 2021-02-21 DIAGNOSIS — F411 Generalized anxiety disorder: Secondary | ICD-10-CM | POA: Diagnosis not present

## 2021-02-21 DIAGNOSIS — E782 Mixed hyperlipidemia: Secondary | ICD-10-CM | POA: Diagnosis not present

## 2021-02-21 DIAGNOSIS — F3341 Major depressive disorder, recurrent, in partial remission: Secondary | ICD-10-CM | POA: Diagnosis not present

## 2021-02-21 DIAGNOSIS — I10 Essential (primary) hypertension: Secondary | ICD-10-CM | POA: Diagnosis not present

## 2021-03-18 DIAGNOSIS — H26491 Other secondary cataract, right eye: Secondary | ICD-10-CM | POA: Diagnosis not present

## 2021-03-18 DIAGNOSIS — H04123 Dry eye syndrome of bilateral lacrimal glands: Secondary | ICD-10-CM | POA: Diagnosis not present

## 2021-03-18 DIAGNOSIS — H353111 Nonexudative age-related macular degeneration, right eye, early dry stage: Secondary | ICD-10-CM | POA: Diagnosis not present

## 2021-03-18 DIAGNOSIS — H353122 Nonexudative age-related macular degeneration, left eye, intermediate dry stage: Secondary | ICD-10-CM | POA: Diagnosis not present

## 2021-03-18 DIAGNOSIS — H524 Presbyopia: Secondary | ICD-10-CM | POA: Diagnosis not present

## 2021-03-19 DIAGNOSIS — M545 Low back pain, unspecified: Secondary | ICD-10-CM | POA: Diagnosis not present

## 2021-04-09 DIAGNOSIS — K219 Gastro-esophageal reflux disease without esophagitis: Secondary | ICD-10-CM | POA: Diagnosis not present

## 2021-04-18 DIAGNOSIS — R197 Diarrhea, unspecified: Secondary | ICD-10-CM | POA: Diagnosis not present

## 2021-04-19 ENCOUNTER — Other Ambulatory Visit: Payer: Self-pay

## 2021-04-23 DIAGNOSIS — F3341 Major depressive disorder, recurrent, in partial remission: Secondary | ICD-10-CM | POA: Diagnosis not present

## 2021-04-23 DIAGNOSIS — G43009 Migraine without aura, not intractable, without status migrainosus: Secondary | ICD-10-CM | POA: Diagnosis not present

## 2021-04-23 DIAGNOSIS — K219 Gastro-esophageal reflux disease without esophagitis: Secondary | ICD-10-CM | POA: Diagnosis not present

## 2021-04-23 DIAGNOSIS — G8929 Other chronic pain: Secondary | ICD-10-CM | POA: Diagnosis not present

## 2021-04-23 DIAGNOSIS — E782 Mixed hyperlipidemia: Secondary | ICD-10-CM | POA: Diagnosis not present

## 2021-04-23 DIAGNOSIS — G4700 Insomnia, unspecified: Secondary | ICD-10-CM | POA: Diagnosis not present

## 2021-04-23 DIAGNOSIS — I10 Essential (primary) hypertension: Secondary | ICD-10-CM | POA: Diagnosis not present

## 2021-04-25 ENCOUNTER — Other Ambulatory Visit (INDEPENDENT_AMBULATORY_CARE_PROVIDER_SITE_OTHER): Payer: PPO

## 2021-04-25 ENCOUNTER — Ambulatory Visit: Payer: PPO | Admitting: Nurse Practitioner

## 2021-04-25 ENCOUNTER — Encounter: Payer: Self-pay | Admitting: Nurse Practitioner

## 2021-04-25 VITALS — BP 130/70 | HR 80 | Ht 65.0 in | Wt 135.0 lb

## 2021-04-25 DIAGNOSIS — R197 Diarrhea, unspecified: Secondary | ICD-10-CM

## 2021-04-25 DIAGNOSIS — R1084 Generalized abdominal pain: Secondary | ICD-10-CM

## 2021-04-25 LAB — CBC WITH DIFFERENTIAL/PLATELET
Basophils Absolute: 0 10*3/uL (ref 0.0–0.1)
Basophils Relative: 0.4 % (ref 0.0–3.0)
Eosinophils Absolute: 0.1 10*3/uL (ref 0.0–0.7)
Eosinophils Relative: 2.2 % (ref 0.0–5.0)
HCT: 37.8 % (ref 36.0–46.0)
Hemoglobin: 12.9 g/dL (ref 12.0–15.0)
Lymphocytes Relative: 33.2 % (ref 12.0–46.0)
Lymphs Abs: 1.8 10*3/uL (ref 0.7–4.0)
MCHC: 34.1 g/dL (ref 30.0–36.0)
MCV: 93.4 fl (ref 78.0–100.0)
Monocytes Absolute: 0.5 10*3/uL (ref 0.1–1.0)
Monocytes Relative: 8.7 % (ref 3.0–12.0)
Neutro Abs: 3 10*3/uL (ref 1.4–7.7)
Neutrophils Relative %: 55.5 % (ref 43.0–77.0)
Platelets: 215 10*3/uL (ref 150.0–400.0)
RBC: 4.04 Mil/uL (ref 3.87–5.11)
RDW: 12.8 % (ref 11.5–15.5)
WBC: 5.4 10*3/uL (ref 4.0–10.5)

## 2021-04-25 LAB — COMPREHENSIVE METABOLIC PANEL
ALT: 15 U/L (ref 0–35)
AST: 20 U/L (ref 0–37)
Albumin: 4.3 g/dL (ref 3.5–5.2)
Alkaline Phosphatase: 71 U/L (ref 39–117)
BUN: 18 mg/dL (ref 6–23)
CO2: 27 mEq/L (ref 19–32)
Calcium: 9.3 mg/dL (ref 8.4–10.5)
Chloride: 104 mEq/L (ref 96–112)
Creatinine, Ser: 0.74 mg/dL (ref 0.40–1.20)
GFR: 77.08 mL/min (ref >60.00)
Glucose, Bld: 95 mg/dL (ref 70–99)
Potassium: 4.1 mEq/L (ref 3.5–5.1)
Sodium: 139 mEq/L (ref 135–145)
Total Bilirubin: 0.7 mg/dL (ref 0.2–1.2)
Total Protein: 7.2 g/dL (ref 6.0–8.3)

## 2021-04-25 NOTE — Progress Notes (Signed)
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 Hilarie Fredrickson and a past anal fissure.  She is followed by Dr. Ardis Hughs who is her primary gastroenterologist.  She presents to our office today for further evaluation regarding nausea, vomiting and diarrhea which started 3 weeks ago.  No obvious food or stress triggers.  She contacted her primary care physician who prescribed Ondansetron which significantly improved her nausea.  She also went on a bland diet.  She recalled having some lower back pain and felt a little dizzy after vomiting few times. No hematemesis. She also took Pepto-Bismol which settled her stomach and she took Imodium for a few days.  Her N/V and diarrhea symptoms resolved 2 weeks ago. She is passing a soft mushy brown stool x 2 in the early am urgency.  No abdominal pain.  No recent antibiotics.  Her most recent colonoscopy was 06/26/2017 which showed diverticulosis to the left colon and internal hemorrhoids, no evidence of colitis. A colonoscopy in 2009 showed chronic active mucosal right sided colitis treated with Mesalamine for a period of time.  No GERD symptoms or dysphagia at this time.  She is on Omeprazole 20 mg once daily.  EGD in 2017 was unremarkable.  Current Outpatient Medications on File Prior to Visit  Medication Sig Dispense Refill  . azelastine (ASTELIN) 0.1 % nasal spray USE TWO SPRAYS IN EACH NOSTRIL TWICE DAILY AS DIRECTED 30 mL 5  . Bilberry, Vaccinium myrtillus, (BILBERRY PO) Take 1 capsule by mouth daily.    . Cholecalciferol (D3-1000 PO) Take by mouth.    . conjugated estrogens (PREMARIN) vaginal cream Premarin 0.625 mg/gram vaginal cream     . cycloSPORINE (RESTASIS) 0.05 % ophthalmic emulsion Restasis 0.05 % eye drops in a dropperette    . doxycycline (VIBRAMYCIN) 100 MG capsule Uses prn for spider bites and ticks    . fluticasone (FLONASE) 50 MCG/ACT nasal spray Place 2 sprays into both nostrils daily. 16 g 6  . HYDROcodone-acetaminophen (NORCO/VICODIN) 5-325 MG tablet hydrocodone 5 mg-acetaminophen 325 mg tablet    . ibuprofen (ADVIL) 800 MG tablet Take 1 tablet (800 mg total) by mouth every 8 (eight) hours as needed. 30 tablet 0  . methocarbamol (ROBAXIN) 500 MG tablet Take 1 tablet (500 mg total) by mouth every 8 (eight) hours as needed for muscle spasms. Take mostly at bedtime 60 tablet 1  . metoprolol succinate (TOPROL-XL) 50 MG 24 hr tablet metoprolol succinate ER 50 mg tablet,extended release 24 hr    . metoprolol tartrate (LOPRESSOR) 25 MG tablet Take 1 tablet (25 mg total) by mouth 2 (two) times daily. 180 tablet 3  . Multiple Vitamins-Minerals (ICAPS LUTEIN & ZEAXANTHIN PO) Take by mouth.    Marland Kitchen omeprazole (PRILOSEC) 20 MG capsule omeprazole 20 mg capsule,delayed release    . ondansetron (ZOFRAN) 4 MG tablet ondansetron HCl 4 mg tablet  TAKE 1 TABLET BY MOUTH TWICE DAILY AS NEEDED FOR NAUSEA    . rosuvastatin (CRESTOR) 10 MG tablet rosuvastatin 10 mg tablet    . terbinafine (LAMISIL) 250 MG tablet Take 1 tablet (250 mg total) by mouth daily. 90 tablet 0  . tiZANidine (ZANAFLEX) 4 MG capsule SMARTSIG:1 Capsule(s) By Mouth 1 to  3 Times Daily PRN    . tiZANidine (ZANAFLEX) 4 MG tablet tizanidine 4 mg tablet    . traZODone (DESYREL) 50 MG tablet trazodone 50 mg tablet  TAKE 1/2 (ONE-HALF) TABLET BY MOUTH AT BEDTIME. MAY INCREASE TO 1 TABLET IF NEEDED    . triamcinolone cream (KENALOG) 0.1 % triamcinolone acetonide 0.1 % topical cream  APPLY CREAM EXTERNALLY TWICE DAILY FOR 14 DAYS    . venlafaxine XR (EFFEXOR-XR) 75 MG 24 hr capsule TAKE ONE CAPSULE BY MOUTH ONCE DAILY WITH  BREAKFAST 90 capsule 3   No current  facility-administered medications on file prior to visit.   Allergies  Allergen Reactions  . Levaquin [Levofloxacin In D5w] Nausea Only    Dizziness and tremulousness    Current Medications, Allergies, Past Medical History, Past Surgical History, Family History and Social History were reviewed in [employer] record.  Review of Systems:   Constitutional: Negative for fever, sweats, chills or weight loss.  Respiratory: Negative for shortness of breath.   Cardiovascular: Negative for chest pain, palpitations and leg swelling.  Gastrointestinal: See HPI.  Musculoskeletal: Negative for back pain or muscle aches.  Neurological: Negative for dizziness, headaches or paresthesias.    Physical Exam: BP 130/70   Pulse 80   Ht 5\' 5"  (1.651 m)   Wt 135 lb (61.2 kg)   BMI 22.47 kg/m   General: 80 year old female in no acute distress. Head: Normocephalic and atraumatic. Eyes: No scleral icterus. Conjunctiva pink . Ears: Normal auditory acuity. Mouth: Dentition intact. No ulcers or lesions.  Lungs: Clear throughout to auscultation. Heart: Regular rate and rhythm, no murmur. Abdomen: Soft, nontender and nondistended. No masses or hepatomegaly. Normal bowel sounds x 4 quadrants.  Rectal: Deferred.  Musculoskeletal: Symmetrical with no gross deformities. Extremities: No edema. Neurological: Alert oriented x 4. No focal deficits.  Psychological: Alert and cooperative. Normal mood and affect  Assessment and Recommendations:  43. 80 year old female with N/V and diarrhea, resolved 2 weeks ago. She is now passing mushy stools in the am with urgency. No hematochezia. History of segmental colitis per colonoscopy in 2009, treated with oral Mesalamine.  -CBC, CMP and fecal calprotectin level  -Probiotic of choice once daily -Take one half Imodium tablet at bedtime as needed -Increase fluid intake -Bland diet, increase as tolerated -Follow-up as needed  2. History of  chronic constipation -Miralax as needed   3.  History of GERD -Continue Omeprazole 20 mg once daily

## 2021-04-25 NOTE — Patient Instructions (Addendum)
If you are age 79 or older, your body mass index should be between 23-30. Your Body mass index is 22.47 kg/m. If this is out of the aforementioned range listed, please consider follow up with your Primary Care Provider.  LABS:  Lab work has been ordered for you today. Our lab is located in the basement. Press "B" on the elevator. The lab is located at the first door on the left as you exit the elevator.  HEALTHCARE LAWS AND MY CHART RESULTS: Due to recent changes in healthcare laws, you may see the results of your imaging and laboratory studies on MyChart before your provider has had a chance to review them.   We understand that in some cases there may be results that are confusing or concerning to you. Not all laboratory results come back in the same time frame and the provider may be waiting for multiple results in order to interpret others.  Please give [place] 48 hours in order for your provider to thoroughly review all the results before contacting the office for clarification of your results.   RECOMMENDATIONS: Take a probiotic of your choice once a day. Talk 1/2 tablet of Imodium at bedtime. Increase fluid intake. Follow a bland diet.  Please call our office if your symptoms worsen.   It was great seeing you today! Thank you for entrusting me with your care and choosing [facility].  Noralyn Pick, CRNP

## 2021-04-26 ENCOUNTER — Other Ambulatory Visit: Payer: PPO

## 2021-04-26 DIAGNOSIS — R197 Diarrhea, unspecified: Secondary | ICD-10-CM

## 2021-04-26 DIAGNOSIS — R1084 Generalized abdominal pain: Secondary | ICD-10-CM | POA: Diagnosis not present

## 2021-04-29 LAB — CALPROTECTIN, FECAL: Calprotectin, Fecal: 93 ug/g (ref 0–120)

## 2021-04-29 NOTE — Progress Notes (Signed)
I agree with the above note, plan 

## 2021-05-02 ENCOUNTER — Other Ambulatory Visit: Payer: Self-pay

## 2021-05-02 ENCOUNTER — Ambulatory Visit: Payer: PPO | Attending: Internal Medicine

## 2021-05-02 ENCOUNTER — Other Ambulatory Visit (HOSPITAL_BASED_OUTPATIENT_CLINIC_OR_DEPARTMENT_OTHER): Payer: Self-pay

## 2021-05-02 DIAGNOSIS — Z23 Encounter for immunization: Secondary | ICD-10-CM

## 2021-05-02 MED ORDER — PFIZER-BIONT COVID-19 VAC-TRIS 30 MCG/0.3ML IM SUSP
INTRAMUSCULAR | 0 refills | Status: DC
  Filled 2021-05-02: qty 0.3, 1d supply, fill #0

## 2021-05-02 NOTE — Progress Notes (Signed)
   Covid-19 Vaccination Clinic  Name:  [name]    MRN: [id number] DOB: 1941/01/14  05/02/2021  [name] was observed post Covid-19 immunization for 15 minutes without incident. She was provided with Vaccine Information Sheet and instruction to access the V-Safe system.   [name] was instructed to call 911 with any severe reactions post vaccine: Marland Kitchen Difficulty breathing  . Swelling of face and throat  . A fast heartbeat  . A bad rash all over body  . Dizziness and weakness   Immunizations Administered    Name Date Dose VIS Date Route   PFIZER Comrnaty(Gray TOP) Covid-19 Vaccine 05/02/2021  9:48 AM 0.3 mL 11/22/2020 Intramuscular   Manufacturer: [employer], [employer]   Lot: [id number]   NDC: [phone]

## 2021-07-05 ENCOUNTER — Other Ambulatory Visit: Payer: Self-pay

## 2021-07-05 ENCOUNTER — Emergency Department (HOSPITAL_BASED_OUTPATIENT_CLINIC_OR_DEPARTMENT_OTHER)
Admission: EM | Admit: 2021-07-05 | Discharge: 2021-07-05 | Disposition: A | Discharge status: Home / Self Care | Payer: PPO | Attending: Emergency Medicine | Admitting: Emergency Medicine

## 2021-07-05 ENCOUNTER — Encounter (HOSPITAL_BASED_OUTPATIENT_CLINIC_OR_DEPARTMENT_OTHER): Payer: Self-pay

## 2021-07-05 DIAGNOSIS — M542 Cervicalgia: Secondary | ICD-10-CM | POA: Diagnosis not present

## 2021-07-05 DIAGNOSIS — Z5321 Procedure and treatment not carried out due to patient leaving prior to being seen by health care provider: Secondary | ICD-10-CM | POA: Diagnosis not present

## 2021-07-05 NOTE — ED Triage Notes (Signed)
"  Pain in left side of neck since this morning and especially when I move it a certain way and it will not let up" per pt  Denies any other symptoms or known injury

## 2021-07-05 NOTE — ED Notes (Signed)
Pt informed registration she was leaving, left the facility

## 2021-07-10 ENCOUNTER — Other Ambulatory Visit: Payer: Self-pay

## 2021-07-10 ENCOUNTER — Ambulatory Visit (INDEPENDENT_AMBULATORY_CARE_PROVIDER_SITE_OTHER): Payer: PPO

## 2021-07-10 ENCOUNTER — Ambulatory Visit: Payer: PPO | Admitting: Podiatry

## 2021-07-10 DIAGNOSIS — L03032 Cellulitis of left toe: Secondary | ICD-10-CM

## 2021-07-10 DIAGNOSIS — S99921A Unspecified injury of right foot, initial encounter: Secondary | ICD-10-CM

## 2021-07-10 MED ORDER — DOXYCYCLINE HYCLATE 100 MG PO TABS: 100.0000 mg | ORAL_TABLET | Freq: Two times a day (BID) | ORAL | 0 refills | Status: DC

## 2021-07-10 NOTE — Patient Instructions (Signed)

## 2021-07-10 NOTE — Progress Notes (Signed)
   HPI: 80 y.o. female presenting today for new complaint regarding pain and tenderness to the left third toe.  Patient states that she does have a history of going barefoot around the house and banging her feet against items around the house.  She also states that for the past month her left third toe has been very red.  Negative for any pain.  She presents for further treatment and evaluation  Past Medical History:  Diagnosis Date   Actinic keratosis 12/03/2012   Anxiety    Effexor helps--psychiatrist Dr [facility]   Arthritis    osteoarthritis, hands   Atypical chest pain    EF 86%, breast attenuation, no significant ischemia   Cervical radiculopathy 11/18/2013   Chronic headache    Diverticulosis    Erosive esophagitis    GERD (gastroesophageal reflux disease) 11/2009   EGD: Dr. 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  Dermatology: Skin is warm, dry and supple bilateral lower extremities. Negative for open lesions or macerations.  Vascular: Palpable pedal pulses bilaterally.  Capillary refill immediate.  There is some mild erythema and edema noted to the left third toe.  It is localized mostly to the distal half of the toe.  There is no open wound  Neurological: Epicritic and protective threshold grossly intact bilaterally.   Musculoskeletal Exam: No pedal deformities noted  Radiographic Exam:  Normal osseous mineralization. Joint spaces preserved. No fracture/dislocation/boney destruction.    Assessment: 1.  Capsulitis versus cellulitis left third toe   Plan of Care:   1. Patient evaluated. X-Rays reviewed.  2.  Prescription for doxycycline 100 mg 2 times daily #20 3.  Recommend good supportive shoes and sneakers that do not constrict the toebox area but provide good protection for the toes and feet 4.  Return to clinic as needed      Edrick Kins, DPM [facility]  Dr. Edrick Kins, DPM    [address], [place] [address]                Office [phone]  Fax [phone]

## 2021-07-11 ENCOUNTER — Other Ambulatory Visit: Payer: Self-pay | Admitting: Podiatry

## 2021-07-11 DIAGNOSIS — R112 Nausea with vomiting, unspecified: Secondary | ICD-10-CM | POA: Diagnosis not present

## 2021-07-11 DIAGNOSIS — L03032 Cellulitis of left toe: Secondary | ICD-10-CM

## 2021-07-11 DIAGNOSIS — K219 Gastro-esophageal reflux disease without esophagitis: Secondary | ICD-10-CM | POA: Diagnosis not present

## 2021-07-11 DIAGNOSIS — M542 Cervicalgia: Secondary | ICD-10-CM | POA: Diagnosis not present

## 2021-07-19 DIAGNOSIS — U071 COVID-19: Secondary | ICD-10-CM | POA: Diagnosis not present

## 2021-08-28 DIAGNOSIS — E559 Vitamin D deficiency, unspecified: Secondary | ICD-10-CM | POA: Diagnosis not present

## 2021-08-28 DIAGNOSIS — F3341 Major depressive disorder, recurrent, in partial remission: Secondary | ICD-10-CM | POA: Diagnosis not present

## 2021-08-28 DIAGNOSIS — E782 Mixed hyperlipidemia: Secondary | ICD-10-CM | POA: Diagnosis not present

## 2021-08-28 DIAGNOSIS — H353111 Nonexudative age-related macular degeneration, right eye, early dry stage: Secondary | ICD-10-CM | POA: Diagnosis not present

## 2021-08-28 DIAGNOSIS — Z1389 Encounter for screening for other disorder: Secondary | ICD-10-CM | POA: Diagnosis not present

## 2021-08-28 DIAGNOSIS — K219 Gastro-esophageal reflux disease without esophagitis: Secondary | ICD-10-CM | POA: Diagnosis not present

## 2021-08-28 DIAGNOSIS — I10 Essential (primary) hypertension: Secondary | ICD-10-CM | POA: Diagnosis not present

## 2021-08-28 DIAGNOSIS — Z8616 Personal history of COVID-19: Secondary | ICD-10-CM | POA: Diagnosis not present

## 2021-08-28 DIAGNOSIS — F411 Generalized anxiety disorder: Secondary | ICD-10-CM | POA: Diagnosis not present

## 2021-08-28 DIAGNOSIS — R5382 Chronic fatigue, unspecified: Secondary | ICD-10-CM | POA: Diagnosis not present

## 2021-08-28 DIAGNOSIS — H353122 Nonexudative age-related macular degeneration, left eye, intermediate dry stage: Secondary | ICD-10-CM | POA: Diagnosis not present

## 2021-08-28 DIAGNOSIS — Z0000 Encounter for general adult medical examination without abnormal findings: Secondary | ICD-10-CM | POA: Diagnosis not present

## 2021-08-29 DIAGNOSIS — H353111 Nonexudative age-related macular degeneration, right eye, early dry stage: Secondary | ICD-10-CM | POA: Diagnosis not present

## 2021-08-29 DIAGNOSIS — H04123 Dry eye syndrome of bilateral lacrimal glands: Secondary | ICD-10-CM | POA: Diagnosis not present

## 2021-08-29 DIAGNOSIS — H16223 Keratoconjunctivitis sicca, not specified as Sjogren's, bilateral: Secondary | ICD-10-CM | POA: Diagnosis not present

## 2021-08-29 DIAGNOSIS — H353122 Nonexudative age-related macular degeneration, left eye, intermediate dry stage: Secondary | ICD-10-CM | POA: Diagnosis not present

## 2021-08-29 DIAGNOSIS — H524 Presbyopia: Secondary | ICD-10-CM | POA: Diagnosis not present

## 2021-09-02 ENCOUNTER — Ambulatory Visit: Payer: PPO | Admitting: Podiatry

## 2021-09-10 DIAGNOSIS — T781XXD Other adverse food reactions, not elsewhere classified, subsequent encounter: Secondary | ICD-10-CM | POA: Diagnosis not present

## 2021-09-10 DIAGNOSIS — J3081 Allergic rhinitis due to animal (cat) (dog) hair and dander: Secondary | ICD-10-CM | POA: Diagnosis not present

## 2021-09-10 DIAGNOSIS — J301 Allergic rhinitis due to pollen: Secondary | ICD-10-CM | POA: Diagnosis not present

## 2021-09-10 DIAGNOSIS — J3089 Other allergic rhinitis: Secondary | ICD-10-CM | POA: Diagnosis not present

## 2021-09-26 DIAGNOSIS — R0981 Nasal congestion: Secondary | ICD-10-CM | POA: Diagnosis not present

## 2021-10-09 ENCOUNTER — Emergency Department (HOSPITAL_BASED_OUTPATIENT_CLINIC_OR_DEPARTMENT_OTHER)
Admission: EM | Admit: 2021-10-09 | Discharge: 2021-10-09 | Disposition: A | Discharge status: Home / Self Care | Payer: PPO | Attending: Emergency Medicine | Admitting: Emergency Medicine

## 2021-10-09 ENCOUNTER — Encounter (HOSPITAL_BASED_OUTPATIENT_CLINIC_OR_DEPARTMENT_OTHER): Payer: Self-pay

## 2021-10-09 ENCOUNTER — Emergency Department (HOSPITAL_BASED_OUTPATIENT_CLINIC_OR_DEPARTMENT_OTHER): Payer: PPO

## 2021-10-09 ENCOUNTER — Other Ambulatory Visit: Payer: Self-pay

## 2021-10-09 DIAGNOSIS — Z87891 Personal history of nicotine dependence: Secondary | ICD-10-CM | POA: Insufficient documentation

## 2021-10-09 DIAGNOSIS — W19XXXA Unspecified fall, initial encounter: Secondary | ICD-10-CM

## 2021-10-09 DIAGNOSIS — W06XXXA Fall from bed, initial encounter: Secondary | ICD-10-CM | POA: Insufficient documentation

## 2021-10-09 DIAGNOSIS — S0990XA Unspecified injury of head, initial encounter: Secondary | ICD-10-CM | POA: Diagnosis not present

## 2021-10-09 DIAGNOSIS — I10 Essential (primary) hypertension: Secondary | ICD-10-CM | POA: Diagnosis not present

## 2021-10-09 DIAGNOSIS — S0101XA Laceration without foreign body of scalp, initial encounter: Secondary | ICD-10-CM

## 2021-10-09 MED ORDER — TETANUS-DIPHTH-ACELL PERTUSSIS 5-2.5-18.5 LF-MCG/0.5 IM SUSY: 0.5000 mL | PREFILLED_SYRINGE | Freq: Once | INTRAMUSCULAR | Status: DC

## 2021-10-09 NOTE — ED Provider Notes (Signed)
[facility] EMERGENCY DEPT Provider Note   CSN: [id number] Arrival date & time: 10/09/21  4098     History Chief Complaint  Patient presents with   [name]    DALANIE Oneal is a 80 y.o. female.  HPI  80 year old female with a history of actinic keratosis, anxiety, arthritis, atypical chest pain, cervical radiculopathy, chronic headache, diverticulosis, erosive esophagitis, GERD, internal hemorrhoids, ischemic colitis, migraines, ulcerative colitis, osteoporosis, palpitations, rotator cuff tendinitis, who presents to the emergency department today for evaluation of a fall that occurred last night.  She was changing a light bulb and was standing on her bed.  She stepped off of her bed she fell and hit her head on the wall.  She did sustain a laceration and had some bleeding.  She did not lose consciousness.  She denies any other injuries or pain elsewhere at this time.  She is not anticoagulated.  Past Medical History:  Diagnosis Date   Actinic keratosis 12/03/2012   Anxiety    Effexor helps--psychiatrist Dr Toy Care   Arthritis    osteoarthritis, hands   Atypical chest pain    EF 86%, breast attenuation, no significant ischemia   Cervical radiculopathy 11/18/2013   Chronic headache    Diverticulosis    Erosive esophagitis    GERD (gastroesophageal reflux disease) 11/2009   EGD: Dr. 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    Patient Active Problem List   Diagnosis Date Noted   Vaginal intraepithelial neoplasia 01/02/2020   Strain of neck muscle 06/21/2019   Migraine 12/04/2017   Tachycardia 12/04/2017   Essential hypertension 12/04/2017    Seasonal allergies 07/01/2016   Osteoporosis 05/24/2014   Dyslipidemia 08/06/2013   Allergic rhinitis 07/16/2010   GERD 11/06/2009   CONSTIPATION 11/06/2009   Generalized anxiety disorder 07/24/2008    Past Surgical History:  Procedure Laterality Date   AUGMENTATION MAMMAPLASTY Bilateral    silicone gel implants   Birth mark removed     CATARACT EXTRACTION Bilateral    COLPOSCOPY     laser vaporization of upper vagina  1998   NM MYOCAR PERF WALL MOTION  01/24/2008   EF 86% neg ischemia   VAGINAL HYSTERECTOMY  1978     OB History     Gravida  1   Para  1   Term  1   Preterm      AB      Living  1      SAB      IAB      Ectopic      Multiple      Live Births              Family History  Problem Relation Age of Onset   Heart disease Mother    Heart failure Mother    Breast cancer Mother 39   Prostate cancer Father    Inflammatory bowel disease Sister    Diabetes Maternal Aunt    Colon cancer Paternal Grandfather    Stomach cancer Neg Hx    Pancreatic cancer Neg Hx     Social History   Tobacco Use  Smoking status: Former    Packs/day: 1.00    Years: 10.00    Pack years: 10.00    Types: Cigarettes    Quit date: 12/15/1988    Years since quitting: 32.8   Smokeless tobacco: Never   Tobacco comments:    quit in the 1980's  Vaping Use   Vaping Use: Never used  Substance Use Topics   Alcohol use: Yes    Alcohol/week: 14.0 standard drinks    Types: 14 Standard drinks or equivalent per week    Comment: drinks wine every day; 2 drinks a day    Drug use: No    Home Medications Prior to Admission medications   Medication Sig Start Date End Date Taking? Authorizing Provider  azelastine (ASTELIN) 0.1 % nasal spray USE TWO SPRAYS IN EACH NOSTRIL TWICE DAILY AS DIRECTED 03/15/15   Shawna Orleans, Doe-Hyun R, DO  Bilberry, Vaccinium myrtillus, (BILBERRY PO) Take 1 capsule by mouth daily.    [provider]  Cholecalciferol (D3-1000 PO) Take by  mouth.    [provider]  conjugated estrogens (PREMARIN) vaginal cream Premarin 0.625 mg/gram vaginal cream    [provider]  COVID-19 mRNA Vac-TriS, Pfizer, (PFIZER-BIONT COVID-19 VAC-TRIS) SUSP injection Inject into the muscle. 05/02/21   Carlyle Basques, MD  cycloSPORINE (RESTASIS) 0.05 % ophthalmic emulsion Restasis 0.05 % eye drops in a dropperette    [provider]  doxycycline (VIBRA-TABS) 100 MG tablet Take 1 tablet (100 mg total) by mouth 2 (two) times daily. 07/10/21   Edrick Kins, DPM  fluticasone (FLONASE) 50 MCG/ACT nasal spray Place 2 sprays into both nostrils daily. 09/22/17   Lucretia Kern, DO  HYDROcodone-acetaminophen (NORCO/VICODIN) 5-325 MG tablet hydrocodone 5 mg-acetaminophen 325 mg tablet    [provider]  ibuprofen (ADVIL) 800 MG tablet Take 1 tablet (800 mg total) by mouth every 8 (eight) hours as needed. 12/05/19   Trula Slade, DPM  methocarbamol (ROBAXIN) 500 MG tablet Take 1 tablet (500 mg total) by mouth every 8 (eight) hours as needed for muscle spasms. Take mostly at bedtime 07/17/20   Gregor Hams, MD  metoprolol succinate (TOPROL-XL) 50 MG 24 hr tablet metoprolol succinate ER 50 mg tablet,extended release 24 hr    [provider]  metoprolol tartrate (LOPRESSOR) 25 MG tablet Take 1 tablet (25 mg total) by mouth 2 (two) times daily. 12/04/17   Lucretia Kern, DO  Multiple Vitamins-Minerals (ICAPS LUTEIN & ZEAXANTHIN PO) Take by mouth.    [provider]  omeprazole (PRILOSEC) 20 MG capsule omeprazole 20 mg capsule,delayed release    [provider]  ondansetron (ZOFRAN) 4 MG tablet ondansetron HCl 4 mg tablet  TAKE 1 TABLET BY MOUTH TWICE DAILY AS NEEDED FOR NAUSEA    [provider]  rosuvastatin (CRESTOR) 10 MG tablet rosuvastatin 10 mg tablet    [provider]  terbinafine (LAMISIL) 250 MG tablet Take 1 tablet (250 mg total) by mouth daily. 01/03/20   Trula Slade, DPM   tiZANidine (ZANAFLEX) 4 MG capsule SMARTSIG:1 Capsule(s) By Mouth 1 to 3 Times Daily PRN 03/19/21   [provider]  tiZANidine (ZANAFLEX) 4 MG tablet tizanidine 4 mg tablet    [provider]  traZODone (DESYREL) 50 MG tablet trazodone 50 mg tablet  TAKE 1/2 (ONE-HALF) TABLET BY MOUTH AT BEDTIME. MAY INCREASE TO 1 TABLET IF NEEDED    [provider]  triamcinolone cream (KENALOG) 0.1 % triamcinolone acetonide 0.1 % topical cream  APPLY CREAM EXTERNALLY TWICE DAILY FOR 14 DAYS    [provider]  venlafaxine XR (EFFEXOR-XR) 75 MG 24 hr capsule TAKE ONE CAPSULE BY MOUTH ONCE DAILY WITH  BREAKFAST 10/27/17   Lucretia Kern, DO    Allergies    Levaquin [levofloxacin in d5w]  Review of Systems   Review of Systems  Constitutional:  Negative for fever.  Musculoskeletal:  Negative for back pain and neck pain.  Skin:  Positive for wound.  Neurological:        Head injury, no loc   Physical Exam Updated Vital Signs BP (!) 174/73 (BP Location: Right Arm)   Pulse 84   Temp 98.3 F (36.8 C) (Oral)   Resp 16   SpO2 100%   Physical Exam Vitals and nursing note reviewed.  Constitutional:      General: She is not in acute distress.    Appearance: She is well-developed.  HENT:     Head: Normocephalic.     Comments: 1cm nonbleeding laceration to the posterior scalp Eyes:     Conjunctiva/sclera: Conjunctivae normal.  Cardiovascular:     Rate and Rhythm: Normal rate.  Pulmonary:     Effort: Pulmonary effort is normal.  Musculoskeletal:        General: Normal range of motion.     Cervical back: Neck supple.  Skin:    General: Skin is warm and dry.  Neurological:     Mental Status: She is alert.     Comments: Mental Status:  Alert, thought content appropriate, able to give a coherent history. Speech fluent without evidence of aphasia. Able to follow 2 step commands without difficulty.  Cranial Nerves:  II: pupils equal, round, reactive to  light III,IV, VI: ptosis not present, extra-ocular motions intact bilaterally  V,VII: smile symmetric, facial light touch sensation equal VIII: hearing grossly normal to voice  X: uvula elevates symmetrically  XI: bilateral shoulder shrug symmetric and strong XII: midline tongue extension without fassiculations Motor:  Normal tone. 5/5 strength of BUE and BLE major muscle groups including strong and equal grip strength and dorsiflexion/plantar flexion Sensory: light touch normal in all extremities.     ED Results / Procedures / Treatments   Labs (all labs ordered are listed, but only abnormal results are displayed) Labs Reviewed - No data to display  EKG None  Radiology CT Head Wo Contrast  Result Date: 10/09/2021 CLINICAL DATA:  Head trauma. EXAM: CT HEAD WITHOUT CONTRAST TECHNIQUE: Contiguous axial images were obtained from the base of the skull through the vertex without intravenous contrast. COMPARISON:  CT head 05/04/2014 FINDINGS: Brain: No evidence of acute infarction, hemorrhage, hydrocephalus, extra-axial collection or mass lesion/mass effect. Generalized parenchymal volume loss with associated enlargement of the ventricles. Hypodensities in the periventricular and subcortical white matter, consistent with chronic small-vessel ischemic changes. Vascular: No hyperdense vessel or unexpected calcification. Skull: Normal. Negative for fracture or focal lesion. Sinuses/Orbits: No acute finding. Mild mucosal thickening in the left sphenoid sinus. Hypoplastic frontal sinuses. Other: None. IMPRESSION: No acute intracranial abnormality. Electronically Signed   By: Ileana Roup M.D.   On: 10/09/2021 11:14    Procedures Procedures   Medications Ordered in ED Medications  Tdap (BOOSTRIX) injection 0.5 mL (has no administration in time range)    ED Course  I have reviewed the triage vital signs and the nursing notes.  Pertinent labs & imaging results that were available during my  care of the patient were reviewed by me and considered in  my medical decision making (see chart for details).    MDM Rules/Calculators/A&P                          80 year old female presents for evaluation mechanical fall that occurred yesterday after she was changing a light bulb and lost her balance stepping off of her bed.  She hit her head on the wall and did not lose consciousness.  She has a small superficial laceration to the posterior scalp that is not bleeding at the time of my evaluation.  Patient had a delayed presentation to the emergency department and therefore sutures/staples were not used to close wound.  Tdap was updated and patient advised on wound care precautions as well as head injury precautions.  She is advised to follow-up with PCP and return to the ED for new or worsening symptoms.  She voices understanding of plan and reasons to return.  All questions answered.  Patient stable for discharge.   Final Clinical Impression(s) / ED Diagnoses Final diagnoses:  Fall, initial encounter  Minor head injury, initial encounter  Laceration of scalp without foreign body, initial encounter    Rx / DC Orders ED Discharge Orders     None        Rodney Booze, PA-C 10/09/21 1352    Sherwood Gambler, MD 10/09/21 1530

## 2021-10-09 NOTE — Discharge Instructions (Signed)
Please follow up with your primary care provider within 5-7 days for re-evaluation of your symptoms.   Please return to the emergency department for any new or worsening symptoms.

## 2021-10-09 NOTE — ED Notes (Signed)
Pt refused Tdap shot.  Pt stated she will receive Tdap from PMD.  Advised pt of importance of followup on Tdap shot asap with PMD or other provider.  Pt verablized understanding.

## 2021-10-09 NOTE — ED Triage Notes (Signed)
Pt reports she fell last night from a step stool/ladder while changing her light bulbs, fell hitting the wall last night at 10pm. Pt reports pain to the area and a lot of bleeding through out the night. Pt denies taking any blood thinners

## 2021-10-11 DIAGNOSIS — K219 Gastro-esophageal reflux disease without esophagitis: Secondary | ICD-10-CM | POA: Diagnosis not present

## 2021-10-11 DIAGNOSIS — G4700 Insomnia, unspecified: Secondary | ICD-10-CM | POA: Diagnosis not present

## 2021-10-11 DIAGNOSIS — G43009 Migraine without aura, not intractable, without status migrainosus: Secondary | ICD-10-CM | POA: Diagnosis not present

## 2021-10-11 DIAGNOSIS — I10 Essential (primary) hypertension: Secondary | ICD-10-CM | POA: Diagnosis not present

## 2021-10-11 DIAGNOSIS — E782 Mixed hyperlipidemia: Secondary | ICD-10-CM | POA: Diagnosis not present

## 2021-10-11 DIAGNOSIS — F3341 Major depressive disorder, recurrent, in partial remission: Secondary | ICD-10-CM | POA: Diagnosis not present

## 2021-10-11 DIAGNOSIS — G8929 Other chronic pain: Secondary | ICD-10-CM | POA: Diagnosis not present

## 2021-10-14 DIAGNOSIS — Z23 Encounter for immunization: Secondary | ICD-10-CM | POA: Diagnosis not present

## 2021-10-14 DIAGNOSIS — E782 Mixed hyperlipidemia: Secondary | ICD-10-CM | POA: Diagnosis not present

## 2021-10-14 DIAGNOSIS — S0101XD Laceration without foreign body of scalp, subsequent encounter: Secondary | ICD-10-CM | POA: Diagnosis not present

## 2021-10-14 DIAGNOSIS — F3341 Major depressive disorder, recurrent, in partial remission: Secondary | ICD-10-CM | POA: Diagnosis not present

## 2021-10-14 DIAGNOSIS — W19XXXA Unspecified fall, initial encounter: Secondary | ICD-10-CM | POA: Diagnosis not present

## 2021-10-14 DIAGNOSIS — I10 Essential (primary) hypertension: Secondary | ICD-10-CM | POA: Diagnosis not present

## 2021-10-14 DIAGNOSIS — G4700 Insomnia, unspecified: Secondary | ICD-10-CM | POA: Diagnosis not present

## 2021-10-28 DIAGNOSIS — M545 Low back pain, unspecified: Secondary | ICD-10-CM | POA: Diagnosis not present

## 2021-10-31 DIAGNOSIS — M5451 Vertebrogenic low back pain: Secondary | ICD-10-CM | POA: Diagnosis not present

## 2021-11-06 DIAGNOSIS — M533 Sacrococcygeal disorders, not elsewhere classified: Secondary | ICD-10-CM | POA: Diagnosis not present

## 2021-11-20 DIAGNOSIS — M542 Cervicalgia: Secondary | ICD-10-CM | POA: Diagnosis not present

## 2021-11-28 DIAGNOSIS — M791 Myalgia, unspecified site: Secondary | ICD-10-CM | POA: Diagnosis not present

## 2021-11-28 DIAGNOSIS — M5451 Vertebrogenic low back pain: Secondary | ICD-10-CM | POA: Diagnosis not present

## 2021-12-27 DIAGNOSIS — R140 Abdominal distension (gaseous): Secondary | ICD-10-CM | POA: Diagnosis not present

## 2021-12-27 DIAGNOSIS — F411 Generalized anxiety disorder: Secondary | ICD-10-CM | POA: Diagnosis not present

## 2022-01-22 ENCOUNTER — Other Ambulatory Visit: Payer: Self-pay

## 2022-01-23 DIAGNOSIS — S61459A Open bite of unspecified hand, initial encounter: Secondary | ICD-10-CM | POA: Diagnosis not present

## 2022-01-23 DIAGNOSIS — R112 Nausea with vomiting, unspecified: Secondary | ICD-10-CM | POA: Diagnosis not present

## 2022-01-23 DIAGNOSIS — Z23 Encounter for immunization: Secondary | ICD-10-CM | POA: Diagnosis not present

## 2022-02-04 ENCOUNTER — Other Ambulatory Visit (INDEPENDENT_AMBULATORY_CARE_PROVIDER_SITE_OTHER): Payer: PPO

## 2022-02-04 ENCOUNTER — Encounter: Payer: Self-pay | Admitting: Nurse Practitioner

## 2022-02-04 ENCOUNTER — Ambulatory Visit: Payer: PPO | Admitting: Nurse Practitioner

## 2022-02-04 VITALS — BP 120/60 | HR 76 | Ht 64.5 in | Wt 131.5 lb

## 2022-02-04 DIAGNOSIS — R197 Diarrhea, unspecified: Secondary | ICD-10-CM | POA: Diagnosis not present

## 2022-02-04 DIAGNOSIS — A048 Other specified bacterial intestinal infections: Secondary | ICD-10-CM

## 2022-02-04 DIAGNOSIS — R112 Nausea with vomiting, unspecified: Secondary | ICD-10-CM

## 2022-02-04 DIAGNOSIS — R1030 Lower abdominal pain, unspecified: Secondary | ICD-10-CM

## 2022-02-04 LAB — COMPREHENSIVE METABOLIC PANEL
ALT: 15 U/L (ref 0–35)
AST: 17 U/L (ref 0–37)
Albumin: 4.4 g/dL (ref 3.5–5.2)
Alkaline Phosphatase: 80 U/L (ref 39–117)
BUN: 15 mg/dL (ref 6–23)
CO2: 27 mEq/L (ref 19–32)
Calcium: 9.3 mg/dL (ref 8.4–10.5)
Chloride: 105 mEq/L (ref 96–112)
Creatinine, Ser: 0.67 mg/dL (ref 0.40–1.20)
GFR: 82.81 mL/min (ref >60.00)
Glucose, Bld: 104 mg/dL — ABNORMAL HIGH (ref 70–99)
Potassium: 4.5 mEq/L (ref 3.5–5.1)
Sodium: 138 mEq/L (ref 135–145)
Total Bilirubin: 0.8 mg/dL (ref 0.2–1.2)
Total Protein: 7.6 g/dL (ref 6.0–8.3)

## 2022-02-04 LAB — CBC WITH DIFFERENTIAL/PLATELET
Basophils Absolute: 0 10*3/uL (ref 0.0–0.1)
Basophils Relative: 0.5 % (ref 0.0–3.0)
Eosinophils Absolute: 0.1 10*3/uL (ref 0.0–0.7)
Eosinophils Relative: 2.6 % (ref 0.0–5.0)
HCT: 40.2 % (ref 36.0–46.0)
Hemoglobin: 13.4 g/dL (ref 12.0–15.0)
Lymphocytes Relative: 25.4 % (ref 12.0–46.0)
Lymphs Abs: 1.4 10*3/uL (ref 0.7–4.0)
MCHC: 33.4 g/dL (ref 30.0–36.0)
MCV: 94 fl (ref 78.0–100.0)
Monocytes Absolute: 0.5 10*3/uL (ref 0.1–1.0)
Monocytes Relative: 9.1 % (ref 3.0–12.0)
Neutro Abs: 3.4 10*3/uL (ref 1.4–7.7)
Neutrophils Relative %: 62.4 % (ref 43.0–77.0)
Platelets: 188 10*3/uL (ref 150.0–400.0)
RBC: 4.27 Mil/uL (ref 3.87–5.11)
RDW: 12.9 % (ref 11.5–15.5)
WBC: 5.4 10*3/uL (ref 4.0–10.5)

## 2022-02-04 LAB — LIPASE: Lipase: 20 U/L (ref 11.0–59.0)

## 2022-02-04 NOTE — Progress Notes (Signed)
02/04/2022 [name] [id number] 1941/04/29   Chief Complaint: Nausea, vomiting and diarrhea  History of Present Illness: [name] is a 81 year old female with a past medical history of arthritis, anxiety, GERD, chronic active mucosal right sided colitis per colonoscopy in 2009, chronic constipation, internal hemorrhoids s/p banding 12/2019 and 03/2020 by Dr. Hilarie Fredrickson and a past anal fissure.  She is followed by Dr. Ardis Hughs who is her primary gastroenterologist.  I last saw [name] in our office on 04/25/2021 for further evaluation regarding nausea, vomiting and diarrhea x 3 weeks which resolved prior to her appointment date.  However, she was passing mushy urgent stools when she was prescribed a probiotic once daily and Imodium as needed. Labs were done which showed a normal fecal calprotectin level, normal CBC and CMP.  Her altered bowel pattern gradually resolved.  She presents to our office today for further evaluation regarding recurrent nausea, vomiting and diarrhea.  She is accompanied by her husband.  She reports having episodes of nausea then vomits partially digested food 1 or 2 hours after eating and she is awakened during the middle of the night to vomit x 3 episodes over the past 4 to 6 weeks.  No coffee-ground or frank hematemesis.  She last vomited 2 to 3 days ago.  She has lower abdominal pain during the episodes of nausea and vomiting, no upper abdominal pain.  No specific food or stress triggers.  She describes passing 3-4 brown nonbloody mud to watery diarrhea stools daily.  She is taking Imodium 1 or 2 tabs daily as needed.  No antibiotic use for the past 2 to 3 months.  No heartburn.  She complains of having difficulty swallowing bread which gets stuck to the upper esophagus for the past 3 months.  She drinks water and raises her arms above her head and bread passes down the esophagus.  She denies coughing/gagging or vomiting out the stuck bread.  No difficulty  swallowing liquids or pills.  She has lost 10 pounds over the past 3 to 4 months.  Her most recent colonoscopy was 06/26/2017 which showed diverticulosis to the left colon and internal hemorrhoids, no evidence of colitis. A colonoscopy in 2009 showed chronic active mucosal right sided colitis treated with Mesalamine for a period of time. She is on Omeprazole 20 mg once daily.  EGD in 2017 was unremarkable  CBC Latest Ref Rng & Units 04/25/2021 01/02/2020 09/27/2019  WBC 4.0 - 10.5 K/uL 5.4 6.2 4.0  Hemoglobin 12.0 - 15.0 g/dL 12.9 13.1 12.7  Hematocrit 36.0 - 46.0 % 37.8 38.2 37.5  Platelets 150.0 - 400.0 K/uL 215.0 188 192.0    CMP Latest Ref Rng & Units 04/25/2021 01/02/2020 09/27/2019  Glucose 70 - 99 mg/dL 95 - 103(H)  BUN 6 - 23 mg/dL 18 - 17  Creatinine 0.40 - 1.20 mg/dL 0.74 - 0.75  Sodium 135 - 145 mEq/L 139 - 137  Potassium 3.5 - 5.1 mEq/L 4.1 - 4.8  Chloride 96 - 112 mEq/L 104 - 102  CO2 19 - 32 mEq/L 27 - 27  Calcium 8.4 - 10.5 mg/dL 9.3 - 9.6  Total Protein 6.0 - 8.3 g/dL 7.2 7.0 7.4  Total Bilirubin 0.2 - 1.2 mg/dL 0.7 0.6 0.7  Alkaline Phos 39 - 117 U/L 71 - 78  AST 0 - 37 U/L 20 23 22   ALT 0 - 35 U/L 15 22 19      Past Medical History:  Diagnosis Date  Actinic keratosis 12/03/2012   Anxiety    Effexor helps--psychiatrist Dr Toy Care   Arthritis    osteoarthritis, hands   Atypical chest pain    EF 86%, breast attenuation, no significant ischemia   Cervical radiculopathy 11/18/2013   Chronic headache    Diverticulosis    Erosive esophagitis    GERD (gastroesophageal reflux disease) 11/2009   EGD: Dr. 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     Cholecalciferol (D3-1000 PO) Take by mouth.     conjugated estrogens (PREMARIN) vaginal cream Premarin 0.625 mg/gram vaginal cream     cycloSPORINE (RESTASIS) 0.05 % ophthalmic emulsion Restasis 0.05 % eye drops in a dropperette     fluticasone (FLONASE) 50 MCG/ACT nasal spray Place 2 sprays into both nostrils daily. 16 g 6   ibuprofen (ADVIL) 800 MG tablet Take 1 tablet (800 mg total) by mouth every 8 (eight) hours as needed. 30 tablet 0   methocarbamol (ROBAXIN) 500 MG tablet Take 1 tablet (500 mg total) by mouth every 8 (eight) hours as needed for muscle spasms. Take mostly at bedtime 60 tablet 1   metoprolol succinate (TOPROL-XL) 50 MG 24 hr tablet metoprolol succinate ER 50 mg tablet,extended release 24 hr     metoprolol tartrate (LOPRESSOR) 25 MG tablet Take 1 tablet (25 mg total) by mouth 2 (two) times daily. 180 tablet 3   mometasone (ELOCON) 0.1 % cream 1 application     Multiple Vitamins-Minerals (ICAPS LUTEIN & ZEAXANTHIN PO) Take by mouth.     omeprazole (PRILOSEC) 40 MG capsule Take 1 capsule by mouth daily.     ondansetron (ZOFRAN) 4 MG tablet ondansetron HCl 4 mg tablet  TAKE 1 TABLET BY MOUTH TWICE DAILY AS NEEDED FOR NAUSEA     rosuvastatin (CRESTOR) 10 MG tablet rosuvastatin 10 mg tablet     terbinafine (LAMISIL) 250 MG tablet Take 1 tablet (250 mg total) by mouth daily. 90 tablet 0    triamcinolone cream (KENALOG) 0.1 % triamcinolone acetonide 0.1 % topical cream  APPLY CREAM EXTERNALLY TWICE DAILY FOR 14 DAYS     venlafaxine XR (EFFEXOR-XR) 75 MG 24 hr capsule TAKE ONE CAPSULE BY MOUTH ONCE DAILY WITH  BREAKFAST 90 capsule 3   No current facility-administered medications on file prior to visit.   Allergies  Allergen Reactions   Levaquin [Levofloxacin In D5w] Nausea Only    Dizziness and tremulousness   Current Medications, Allergies, Past Medical History, Past  Surgical History, Family History and Social History were reviewed in [employer] record.  Review of Systems:   Constitutional: Negative for fever, sweats, chills or weight loss.  Respiratory: Negative for shortness of breath.   Cardiovascular: Negative for chest pain, palpitations and leg swelling.  Gastrointestinal: See HPI.  Musculoskeletal: Negative for back pain or muscle aches.  Neurological: Negative for dizziness, headaches or paresthesias.    Physical Exam: Ht 5' 4.5" (1.638 m) Comment: height measured without shoes   Wt 131 lb 8 oz (59.6 kg)    BMI 22.22 kg/m   Wt Readings from Last 3 Encounters:  02/04/22 131 lb 8 oz (59.6 kg)  07/05/21 132 lb (59.9 kg)  04/25/21 135 lb (61.2 kg)    General: 81 year old female in no acute distress. Head: Normocephalic and atraumatic. Eyes: No scleral icterus. Conjunctiva pink . Ears: Normal auditory acuity. Mouth: Dentition intact. No ulcers or lesions.  Lungs: Clear throughout to auscultation. Heart: Regular rate and rhythm, no murmur. Abdomen: Soft, nondistended.  Mild right mid to lower quadrant tenderness and mild LLQ tenderness without rebound or guarding.  No masses or hepatomegaly. Normal bowel sounds x 4 quadrants.  Rectal: Deferred. Musculoskeletal: Symmetrical with no gross deformities. Extremities: No edema. Neurological: Alert oriented x 4. No focal deficits.  Psychological: Alert and cooperative. Normal mood and  affect  Assessment and Recommendations:  37) 81 year old female with episodic nausea/vomiting with lower abdominal pain.  Reported 10 pound weight loss -CBC, CMP, lipase and CRP -CTAP with oral and IV contrast.  BUN/creatinine level to be reviewed prior to the patient receiving IV contrast. -Ondansetron 4 mg ODT every 6 to 8 hours as needed, patient has prior Rx  2) Diarrhea x 4 to 6 weeks. Her most recent colonoscopy was 06/26/2017 which showed diverticulosis to the left colon and internal hemorrhoids, no evidence of colitis. A colonoscopy in 2009 showed chronic active mucosal right sided colitis treated with Mesalamine for a period of time. -GI pathogen panel -Push fluids, bland diet -Consider a diagnostic colonoscopy at the time of the her EGD if pathogen panel negative and diarrhea persists/worsens -Imodium 1 tab p.o. daily as needed, hold if no BM in 24 hours  3) History of GERD.  Intermittent dysphagia primarily with bread -EGD benefits and risks discussed including risk with sedation, risk of bleeding, perforation and infection  -EGD will be scheduled after CT and lab results reviewed -Continue Omeprazole 40 mg daily

## 2022-02-04 NOTE — Patient Instructions (Signed)
IMAGING: You will be contacted by [facility] (Your caller ID will indicate phone # [phone]) in the next 7 days to schedule your CT Scan. If you have not heard from them within 7 business days, please call [facility] at [phone] to follow up on the status of your appointment.   LABS:   Please proceed to the basement level for lab work before leaving today. Press "B" on the elevator. The lab is located at the first door on the left as you exit the elevator.  HEALTHCARE LAWS AND MY CHART RESULTS:   Due to recent changes in healthcare laws, you may see results of your imaging and/or laboratory studies on MyChart before I have had a chance to review them.  I understand that in some cases there may be results that are confusing or concerning to you. Please understand that not all results are received at the same time and often I may need to interpret multiple results in order to provide you with the best plan of care or course of treatment. Therefore, I ask that you please give me 48 hours to thoroughly review all your results before contacting my office for clarification.   RECOMMENDATIONS:  Ondansetron (ZOFRAN ODT) 4 MG disintegrating tablet- Place one tablet under the tongue 3 times a day as needed for nausea or vomiting. Call us if you need a refill. Imodium 1 tablet daily as needed. Push fluid intake. Avoid eating bread for now.   BMI:  If you are age 93 or older, your body mass index should be between 23-30. Your Body mass index is 22.22 kg/m. If this is out of the aforementioned range listed, please consider follow up with your Primary Care Provider.  If you are age 47 or younger, your body mass index should be between 19-25. Your Body mass index is 22.22 kg/m. If this is out of the aformentioned range listed, please consider follow up with your Primary Care Provider.   MY CHART:  The [facility] providers would like to encourage you to  use [facility] to communicate with providers for non-urgent requests or questions.  Due to long hold times on the telephone, sending your provider a message by [facility] may be a faster and more efficient way to get a response.  Please allow 48 business hours for a response.  Please remember that this is for non-urgent requests.   Thank you for trusting me with your gastrointestinal care!    Noralyn Pick, CRNP

## 2022-02-04 NOTE — Progress Notes (Signed)
I agree with the above note, plan 

## 2022-02-05 ENCOUNTER — Other Ambulatory Visit: Payer: PPO

## 2022-02-05 DIAGNOSIS — R197 Diarrhea, unspecified: Secondary | ICD-10-CM

## 2022-02-05 DIAGNOSIS — A048 Other specified bacterial intestinal infections: Secondary | ICD-10-CM

## 2022-02-05 DIAGNOSIS — R1030 Lower abdominal pain, unspecified: Secondary | ICD-10-CM | POA: Diagnosis not present

## 2022-02-05 DIAGNOSIS — R112 Nausea with vomiting, unspecified: Secondary | ICD-10-CM | POA: Diagnosis not present

## 2022-02-07 LAB — GI PROFILE, STOOL, PCR

## 2022-02-11 ENCOUNTER — Telehealth: Payer: Self-pay | Admitting: Nurse Practitioner

## 2022-02-11 NOTE — Telephone Encounter (Signed)
Called back. Got her voicemail. Left her a message of my return call.

## 2022-02-11 NOTE — Telephone Encounter (Signed)
Inbound call from patient states she have had diarrhea for 3 weeks now and states she is taking imodium but it is not helping her. Also have questions about CT

## 2022-02-12 NOTE — Telephone Encounter (Signed)
Called patient. No answer. Left message on her voicemail. ?

## 2022-02-13 ENCOUNTER — Other Ambulatory Visit: Payer: Self-pay

## 2022-02-13 ENCOUNTER — Ambulatory Visit (HOSPITAL_COMMUNITY)
Admission: RE | Admit: 2022-02-13 | Discharge: 2022-02-13 | Disposition: A | Discharge status: Home / Self Care | Payer: PPO | Source: Ambulatory Visit | Attending: Nurse Practitioner | Admitting: Nurse Practitioner

## 2022-02-13 ENCOUNTER — Other Ambulatory Visit (HOSPITAL_COMMUNITY): Payer: PPO

## 2022-02-13 DIAGNOSIS — R1030 Lower abdominal pain, unspecified: Secondary | ICD-10-CM | POA: Diagnosis not present

## 2022-02-13 DIAGNOSIS — A048 Other specified bacterial intestinal infections: Secondary | ICD-10-CM | POA: Diagnosis not present

## 2022-02-13 DIAGNOSIS — R112 Nausea with vomiting, unspecified: Secondary | ICD-10-CM | POA: Insufficient documentation

## 2022-02-13 DIAGNOSIS — R197 Diarrhea, unspecified: Secondary | ICD-10-CM | POA: Insufficient documentation

## 2022-02-13 DIAGNOSIS — R109 Unspecified abdominal pain: Secondary | ICD-10-CM | POA: Diagnosis not present

## 2022-02-13 DIAGNOSIS — I700 Atherosclerosis of aorta: Secondary | ICD-10-CM | POA: Diagnosis not present

## 2022-02-13 MED ORDER — IOHEXOL 300 MG/ML  SOLN
80.0000 mL | Freq: Once | INTRAMUSCULAR | Status: AC | PRN
  Administered 2022-02-13: 80 mL via INTRAVENOUS

## 2022-02-14 ENCOUNTER — Telehealth: Payer: Self-pay | Admitting: Nurse Practitioner

## 2022-02-14 NOTE — Telephone Encounter (Signed)
Patient called requesting Ct results from 3/2. Please advise.  ?

## 2022-02-14 NOTE — Telephone Encounter (Signed)
No results are in her chart yet.  ?Called the patient. No answer. Got voicemail. Left her a message that she does not have results in her chart yet. She will be able to see the report once it is posted. Asked she be patient and allow the provider an opportunity to review the results. ?

## 2022-02-17 ENCOUNTER — Ambulatory Visit (HOSPITAL_COMMUNITY): Payer: PPO

## 2022-02-17 DIAGNOSIS — F411 Generalized anxiety disorder: Secondary | ICD-10-CM | POA: Diagnosis not present

## 2022-02-17 DIAGNOSIS — F3341 Major depressive disorder, recurrent, in partial remission: Secondary | ICD-10-CM | POA: Diagnosis not present

## 2022-02-17 DIAGNOSIS — I10 Essential (primary) hypertension: Secondary | ICD-10-CM | POA: Diagnosis not present

## 2022-02-19 NOTE — Progress Notes (Signed)
Noted, agree with sending a letter. Await patient's response.  ?

## 2022-02-24 ENCOUNTER — Encounter: Payer: Self-pay | Admitting: Gastroenterology

## 2022-02-27 ENCOUNTER — Telehealth: Payer: Self-pay | Admitting: Nurse Practitioner

## 2022-02-27 ENCOUNTER — Encounter: Payer: Self-pay | Admitting: Gastroenterology

## 2022-02-27 NOTE — Telephone Encounter (Signed)
Patient called is having some vomiting seeking advise. ?

## 2022-02-27 NOTE — Telephone Encounter (Signed)
Returned pt call to inquire further about her symptoms. LVM requesting returned call.  ? ?Review of pt chart indicates she should be taking Zofran prn and has been scheduled for EGD. Want to ensure she is taking Rx'd Zofran and offer to move up EGD is pt is able. Will await her returned call. ?

## 2022-02-28 DIAGNOSIS — H04123 Dry eye syndrome of bilateral lacrimal glands: Secondary | ICD-10-CM | POA: Diagnosis not present

## 2022-02-28 DIAGNOSIS — H353211 Exudative age-related macular degeneration, right eye, with active choroidal neovascularization: Secondary | ICD-10-CM | POA: Diagnosis not present

## 2022-02-28 DIAGNOSIS — H524 Presbyopia: Secondary | ICD-10-CM | POA: Diagnosis not present

## 2022-02-28 DIAGNOSIS — H26491 Other secondary cataract, right eye: Secondary | ICD-10-CM | POA: Diagnosis not present

## 2022-02-28 DIAGNOSIS — H353122 Nonexudative age-related macular degeneration, left eye, intermediate dry stage: Secondary | ICD-10-CM | POA: Diagnosis not present

## 2022-02-28 NOTE — Telephone Encounter (Signed)
SECOND ATTEMPT: ° °LVM requesting returned call. °

## 2022-03-03 NOTE — Telephone Encounter (Signed)
FINAL ATTEMPT:  LVM requesting returned call 

## 2022-03-04 ENCOUNTER — Telehealth: Payer: Self-pay | Admitting: Gastroenterology

## 2022-03-04 NOTE — Telephone Encounter (Signed)
Patient has an upcoming PV (4/11) and endo/colon scheduled 4/25.  She is having difficulty with a lot of rectal bleeding and is questioning whether or not these appointments can be moved up sooner or if there is anything she can be doing in the interim.  Please call patient and advise.  Thank you. ?

## 2022-03-04 NOTE — Telephone Encounter (Signed)
Attempts were previously made to contact pt as documented in 02/27/22 TE. Again, called pt to inquire further. Unfortunately, Dr. Ardis Hughs does not have a sooner availability than her currently scheduled 4/25 procedure. LVM requesting returned call. ?

## 2022-03-04 NOTE — Telephone Encounter (Signed)
The pt was last seen by [facility].  Will send to her nurse.  ?

## 2022-03-05 NOTE — Telephone Encounter (Signed)
SECOND ATTEMPT (although in reality this is the 5th attempt. Refer to 02/27/22 encounter): ? ?Again LVM requesting returned call. ?

## 2022-03-06 ENCOUNTER — Encounter: Payer: Self-pay | Admitting: Nurse Practitioner

## 2022-03-06 NOTE — Telephone Encounter (Signed)
FINAL ATTEMPT: ? ?LVM requesting returned call. Given failed attempts from 3/16 and with noted in this encounter as well as pt concerns, routing this message to [facility], CRNP and Dr. Ardis Hughs as Juluis Rainier ?

## 2022-03-06 NOTE — Telephone Encounter (Signed)
Letter has been generated and sent BOTH via mail and My Chart. ?

## 2022-03-12 NOTE — Telephone Encounter (Signed)
Patient called today requesting that she get no more letters as she has already has an appointment scheduled for a PV as well as an endo/colon with Dr. Ardis Hughs. ? ?Thank you. ?

## 2022-03-12 NOTE — Telephone Encounter (Signed)
Letter was sent to pt given her concerns and given our failed efforts to reach her to attempt to move up her procedure and to resolve her concerns. No malice intent was meant for the letter but rather to help resolve and alleviate her concerns. ?

## 2022-03-12 NOTE — Progress Notes (Signed)
?[facility] Note ? ?03/17/2022 ? ?  ? ?CHIEF COMPLAINT ?Patient presents for Retina Evaluation ? ? ?HISTORY OF PRESENT ILLNESS: ?[name] is a 81 y.o. female who presents to the clinic today for:  ? ?HPI   ? ? Retina Evaluation   ?In left eye.  This started 1 month ago.  Duration of 2 weeks. ? ?  ?  ? ? Comments   ?Pt here for ret eval referred by Dr. 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 Pt states she went to see Dr. Lucianne Lei for a routine appointment a few weeks ago but has been experiencing a dark spot in central vision OD, reports some minor blurriness OS. Pt states shes been experiencing symptoms for about a month. Pt states she previously saw Dr. Kathlen Mody for macular degeneration. Pt reports occasional use of Restasis OU. She reports having rx specs but doesn't wear them often, progressive lenses she hasnt adjusted to.  ? ?  ?  ?Last edited by Kingsley Spittle, COT on 03/17/2022  9:00 AM.  ?  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 Kathlen Mody told her previously that she had macular degeneration, pt states she takes vits, but not AREDS 2, pt states she has been under stress since Christmas due to getting a new puppy, pt is taking metoprolol for migraines, she uses Flonase and azelastine for allergies ? ?Referring physician: ?Lisabeth Pick, MD ?[address] ?[place],  [place] [address] ? ?HISTORICAL INFORMATION:  ? ?Selected notes from the [place] ?Referred by Dr. Lucianne Lei for ex ARMD ?LEE:  ?Ocular Hx- ?PMH- ?  ? ?CURRENT MEDICATIONS: ?Current Outpatient Medications (Ophthalmic Drugs)  ?Medication Sig  ? cycloSPORINE (RESTASIS) 0.05 % ophthalmic emulsion Restasis 0.05 % eye drops in a dropperette  ? ?No current facility-administered medications for this visit. (Ophthalmic Drugs)  ? ?Current Outpatient Medications (Other)   ?Medication Sig  ? azelastine (ASTELIN) 0.1 % nasal spray USE TWO SPRAYS IN EACH NOSTRIL TWICE DAILY AS DIRECTED  ? Bilberry, Vaccinium myrtillus, (BILBERRY PO) Take 1 capsule by mouth daily.  ? Cholecalciferol (D3-1000 PO) Take by mouth.  ? conjugated estrogens (PREMARIN) vaginal cream Premarin 0.625 mg/gram vaginal cream  ? fluticasone (FLONASE) 50 MCG/ACT nasal spray Place 2 sprays into both nostrils daily.  ? ibuprofen (ADVIL) 800 MG tablet Take 1 tablet (800 mg total) by mouth every 8 (eight) hours as needed.  ? methocarbamol (ROBAXIN) 500 MG tablet Take 1 tablet (500 mg total) by mouth every 8 (eight) hours as needed for muscle spasms. Take mostly at bedtime  ? metoprolol tartrate (LOPRESSOR) 25 MG tablet Take 1 tablet (25 mg total) by mouth 2 (two) times daily.  ? mometasone (ELOCON) 0.1 % cream 1 application  ? Multiple Vitamins-Minerals (ICAPS LUTEIN & ZEAXANTHIN PO) Take by mouth.  ? omeprazole (PRILOSEC) 40 MG capsule Take 1 capsule by mouth daily.  ? ondansetron (ZOFRAN) 4 MG tablet ondansetron HCl 4 mg tablet ? TAKE 1 TABLET BY MOUTH TWICE DAILY AS NEEDED FOR NAUSEA  ? venlafaxine XR (EFFEXOR-XR) 75 MG 24 hr capsule TAKE ONE CAPSULE BY MOUTH ONCE DAILY WITH  BREAKFAST  ? metoprolol succinate (TOPROL-XL) 50 MG 24 hr tablet metoprolol succinate ER 50 mg tablet,extended release 24 hr (Patient not taking: Reported on 03/17/2022)  ?  rosuvastatin (CRESTOR) 10 MG tablet rosuvastatin 10 mg tablet (Patient not taking: Reported on 03/17/2022)  ? terbinafine (LAMISIL) 250 MG tablet Take 1 tablet (250 mg total) by mouth daily. (Patient not taking: Reported on 03/17/2022)  ? triamcinolone cream (KENALOG) 0.1 % triamcinolone acetonide 0.1 % topical cream ? APPLY CREAM EXTERNALLY TWICE DAILY FOR 14 DAYS (Patient not taking: Reported on 03/17/2022)  ? ?No current facility-administered medications for this visit. (Other)  ? ?REVIEW OF SYSTEMS: ?ROS   ?Positive for: Eyes, Psychiatric ?Negative for: Constitutional,  Gastrointestinal, Neurological, Skin, Genitourinary, Musculoskeletal, HENT, Endocrine, Cardiovascular, Respiratory, Allergic/Imm, Heme/Lymph ?Last edited by Kingsley Spittle, COT on 03/17/2022  8:55 AM.  ?  ? ?ALLERGIES ?Allergies  ?Allergen Reactions  ? Levaquin [Levofloxacin In D5w] Nausea Only  ?  Dizziness and tremulousness  ? ?PAST MEDICAL HISTORY ?Past Medical History:  ?Diagnosis Date  ? Actinic keratosis 12/03/2012  ? Anxiety   ? Effexor helps--psychiatrist Dr Toy Care  ? Arthritis   ? osteoarthritis, hands  ? Atypical chest pain   ? EF 86%, breast attenuation, no significant ischemia  ? Cervical radiculopathy 11/18/2013  ? Chronic headache   ? Diverticulosis   ? Erosive esophagitis   ? GERD (gastroesophageal reflux disease) 11/2009  ? EGD: Dr. Sharlett Iles: erosive stricture dilated  ? Internal hemorrhoids   ? ISCHEMIC COLITIS 06/05/2008  ? Qualifier: Diagnosis of  By: Nils Pyle CMA Deborra Medina), Mearl Latin    ? Migraines   ? 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 Osteoporosis 02/2012  ? t score -2.5 spine  ? Palpitations   ? Rotator cuff tendonitis 05/26/2011  ? 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 ?Past Surgical History:  ?Procedure Laterality Date  ? AUGMENTATION MAMMAPLASTY Bilateral   ? silicone gel implants  ? Birth mark removed    ? CATARACT EXTRACTION Bilateral   ? COLPOSCOPY    ? laser vaporization of upper vagina  1998  ? NM MYOCAR PERF WALL MOTION  01/24/2008  ? EF 86% neg ischemia  ? VAGINAL HYSTERECTOMY  1978  ? ? ?FAMILY HISTORY ?Family History  ?Problem Relation Age of Onset  ? Heart disease Mother   ? Heart failure Mother   ? Breast cancer Mother 2  ? Prostate cancer Father   ? Inflammatory bowel disease Sister   ? Diabetes Maternal Aunt   ? Colon cancer Paternal Grandfather   ? Stomach cancer Neg Hx   ? Pancreatic cancer Neg Hx   ? ?SOCIAL HISTORY ?Social History  ? ?Tobacco Use  ? Smoking status: Former  ?  Packs/day: 1.00  ?  Years: 10.00  ?  Pack years: 10.00  ?  Types:  Cigarettes  ?  Quit date: 12/15/1988  ?  Years since quitting: 33.2  ? Smokeless tobacco: Never  ? Tobacco comments:  ?  quit in the 1980's  ?Vaping Use  ? Vaping Use: Never used  ?Substance Use Topics  ? Alcohol use: Yes  ?  Alcohol/week: 14.0 standard drinks  ?  Types: 14 Standard drinks or equivalent per week  ?  Comment: drinks wine every day; 2 drinks a day   ? Drug use: No  ?  ? ?  ?OPHTHALMIC EXAM: ? ?Base Eye Exam   ? ? Visual Acuity (Snellen - Linear)   ? ?   Right Left  ? Dist [place] 20/30 20/40  ? Dist ph [place] NI NI  ? ?  ?  ? ? Tonometry (Tonopen, 9:10 AM)   ? ?  Right Left  ? Pressure 22 20  ? ?  ?  ? ? Pupils   ? ?   Dark Light Shape React APD  ? Right 5 4 Round Brisk None  ? Left 5 4 Round Brisk None  ? ?  ?  ? ? Visual Fields (Counting fingers)   ? ?   Left Right  ?  Full   ? Restrictions  Partial inner superior temporal deficiency  ? ?  ?  ? ? Extraocular Movement   ? ?   Right Left  ?  Full, Ortho Full, Ortho  ? ?  ?  ? ? Neuro/Psych   ? ? Oriented x3: Yes  ? Mood/Affect: Normal  ? ?  ?  ? ? Dilation   ? ? Both eyes: 1.0% Mydriacyl, 2.5% Phenylephrine @ 9:11 AM  ? ?  ?  ? ?  ? ?Slit Lamp and Fundus Exam   ? ? Slit Lamp Exam   ? ?   Right Left  ? Lids/Lashes Dermatochalasis - upper lid, mild MGD Dermatochalasis - upper lid, mild MGD  ? Conjunctiva/Sclera nasal pingeucula White and quiet  ? Cornea arcus, trace PEE, well healed cataract wound arcus, trace PEE, well healed cataract wound  ? Anterior Chamber deep, clear, narrow temporal angle Deep and quiet  ? Iris Round and dilated Round and dilated  ? Lens PC IOL in good position, trace Posterior capsular opacification PC IOL in good position with open PC  ? Anterior Vitreous syneresis, Posterior vitreous detachment, vitreous condensations syneresis  ? ?  ?  ? ? Fundus Exam   ? ?   Right Left  ? Disc Sharp rim Pink and Sharp, +PPP ST rim  ? C/D Ratio 0.2 0.2  ? Macula Blunted foveal reflex, central edema / SRF, no heme, +drusen, RPE mottling Blunted foveal  reflex, Drusen, RPE mottling and clumping, No heme or edema  ? Vessels attenuated, Tortuous attenuated, mild tortuosity, mild AV crossing changes  ? Periphery Attached, mild midzonal drusen, no heme Attached, mild midzonal dru

## 2022-03-17 ENCOUNTER — Encounter (INDEPENDENT_AMBULATORY_CARE_PROVIDER_SITE_OTHER): Payer: Self-pay | Admitting: Ophthalmology

## 2022-03-17 ENCOUNTER — Ambulatory Visit (INDEPENDENT_AMBULATORY_CARE_PROVIDER_SITE_OTHER): Payer: PPO | Admitting: Ophthalmology

## 2022-03-17 DIAGNOSIS — Z961 Presence of intraocular lens: Secondary | ICD-10-CM

## 2022-03-17 DIAGNOSIS — H353211 Exudative age-related macular degeneration, right eye, with active choroidal neovascularization: Secondary | ICD-10-CM | POA: Diagnosis not present

## 2022-03-17 DIAGNOSIS — H353122 Nonexudative age-related macular degeneration, left eye, intermediate dry stage: Secondary | ICD-10-CM

## 2022-03-17 DIAGNOSIS — H35033 Hypertensive retinopathy, bilateral: Secondary | ICD-10-CM

## 2022-03-17 DIAGNOSIS — I10 Essential (primary) hypertension: Secondary | ICD-10-CM

## 2022-03-17 MED ORDER — BEVACIZUMAB CHEMO INJECTION 1.25MG/0.05ML SYRINGE FOR KALEIDOSCOPE
1.2500 mg | INTRAVITREAL | Status: AC | PRN
  Administered 2022-03-17: 1.25 mg via INTRAVITREAL

## 2022-03-19 DIAGNOSIS — F411 Generalized anxiety disorder: Secondary | ICD-10-CM | POA: Diagnosis not present

## 2022-03-25 ENCOUNTER — Telehealth: Payer: Self-pay | Admitting: *Deleted

## 2022-03-25 ENCOUNTER — Ambulatory Visit: Payer: PPO

## 2022-03-25 NOTE — Telephone Encounter (Signed)
Attempted to call x 2 message left along with  call back # to return call by 5 pm today to reschedule pre-visit or procedure will be cancelled. ?

## 2022-03-25 NOTE — Telephone Encounter (Signed)
No return call received no show letter sent via my chart and mailed procedures cancelled. ?

## 2022-04-02 ENCOUNTER — Telehealth: Payer: Self-pay

## 2022-04-02 ENCOUNTER — Ambulatory Visit: Payer: PPO

## 2022-04-02 NOTE — Telephone Encounter (Signed)
Patient failed to call back to the office prior to end of business- no show letter sent to patient via MyChart; PV appt cancelled; ?

## 2022-04-02 NOTE — Telephone Encounter (Signed)
Multiple attempts made to reach patient- unable to speak with patient-message left for patient to call back to the office to reschedule PV appt prior to end of business day- if patient fails to call back to office prior to [place] and procedure appt will be cancelled and a no show letter will be sent to the patient via [place]; ?

## 2022-04-07 ENCOUNTER — Ambulatory Visit: Payer: PPO

## 2022-04-07 ENCOUNTER — Telehealth: Payer: Self-pay | Admitting: *Deleted

## 2022-04-07 NOTE — Telephone Encounter (Signed)
No return call received from pt to reschedule PV, colon and PV cancelled. No show letter sent to [place] and a copy mailed as well. ?

## 2022-04-07 NOTE — Telephone Encounter (Signed)
Pt called multiple times, no answer. Left messages to return call, last message informed pt we needed a call back by 5pm to reschedule PV and if no call received we would have to cancel colon. ?

## 2022-04-08 ENCOUNTER — Encounter: Payer: PPO | Admitting: Gastroenterology

## 2022-04-08 DIAGNOSIS — M5412 Radiculopathy, cervical region: Secondary | ICD-10-CM | POA: Diagnosis not present

## 2022-04-08 DIAGNOSIS — M9901 Segmental and somatic dysfunction of cervical region: Secondary | ICD-10-CM | POA: Diagnosis not present

## 2022-04-09 DIAGNOSIS — M9901 Segmental and somatic dysfunction of cervical region: Secondary | ICD-10-CM | POA: Diagnosis not present

## 2022-04-09 DIAGNOSIS — M5412 Radiculopathy, cervical region: Secondary | ICD-10-CM | POA: Diagnosis not present

## 2022-04-10 DIAGNOSIS — K5900 Constipation, unspecified: Secondary | ICD-10-CM | POA: Diagnosis not present

## 2022-04-10 DIAGNOSIS — M25511 Pain in right shoulder: Secondary | ICD-10-CM | POA: Diagnosis not present

## 2022-04-10 DIAGNOSIS — R197 Diarrhea, unspecified: Secondary | ICD-10-CM | POA: Diagnosis not present

## 2022-04-10 DIAGNOSIS — R112 Nausea with vomiting, unspecified: Secondary | ICD-10-CM | POA: Diagnosis not present

## 2022-04-11 ENCOUNTER — Encounter: Payer: PPO | Admitting: Gastroenterology

## 2022-04-14 DIAGNOSIS — M9901 Segmental and somatic dysfunction of cervical region: Secondary | ICD-10-CM | POA: Diagnosis not present

## 2022-04-14 DIAGNOSIS — M5412 Radiculopathy, cervical region: Secondary | ICD-10-CM | POA: Diagnosis not present

## 2022-04-15 DIAGNOSIS — L309 Dermatitis, unspecified: Secondary | ICD-10-CM | POA: Diagnosis not present

## 2022-04-15 DIAGNOSIS — D225 Melanocytic nevi of trunk: Secondary | ICD-10-CM | POA: Diagnosis not present

## 2022-04-15 DIAGNOSIS — L814 Other melanin hyperpigmentation: Secondary | ICD-10-CM | POA: Diagnosis not present

## 2022-04-15 DIAGNOSIS — L821 Other seborrheic keratosis: Secondary | ICD-10-CM | POA: Diagnosis not present

## 2022-04-15 DIAGNOSIS — L578 Other skin changes due to chronic exposure to nonionizing radiation: Secondary | ICD-10-CM | POA: Diagnosis not present

## 2022-04-15 NOTE — Progress Notes (Signed)
?[facility] Note ? ?04/21/2022 ?  ? ?CHIEF COMPLAINT ?Patient presents for Retina Follow Up ? ? ?HISTORY OF PRESENT ILLNESS: ?[name] is a 81 y.o. female who presents to the clinic today for:  ? ?HPI   ? ? 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  In both eyes.  This started months ago.  Duration of 5 weeks.  Since onset it is stable.  I, the attending physician,  performed the HPI with the patient and updated documentation appropriately. ? ?  ?  ? ? Comments   ?Patient states that the injection last time because she felt the eye was not numb enough. She has not noticed any vision changes at this time.  ? ?  ?  ?Last edited by Bernarda Caffey, MD on 04/22/2022  4:34 PM.  ?  ? ? ?Referring physician: ?Kristen Loader, FNP ?[name] ?[place],  [place] [id number] ? ?HISTORICAL INFORMATION:  ? 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  ? ?CURRENT MEDICATIONS: ?Current Outpatient Medications (Ophthalmic Drugs)  ?Medication Sig  ? cycloSPORINE (RESTASIS) 0.05 % ophthalmic emulsion Restasis 0.05 % eye drops in a dropperette  ? ?No current facility-administered medications for this visit. (Ophthalmic Drugs)  ? ?Current Outpatient Medications (Other)  ?Medication Sig  ? azelastine (ASTELIN) 0.1 % nasal spray USE TWO SPRAYS IN EACH NOSTRIL TWICE DAILY AS DIRECTED  ? Bilberry, Vaccinium myrtillus, (BILBERRY PO) Take 1 capsule by mouth daily.  ? Cholecalciferol (D3-1000 PO) Take by mouth.  ? conjugated estrogens (PREMARIN) vaginal cream Premarin 0.625 mg/gram vaginal cream  ? fluticasone (FLONASE) 50 MCG/ACT nasal spray Place 2 sprays into both nostrils daily.  ? ibuprofen (ADVIL) 800 MG tablet Take 1 tablet (800 mg total) by mouth every 8 (eight) hours as needed.  ? methocarbamol (ROBAXIN) 500 MG tablet Take 1 tablet (500 mg total) by mouth every 8 (eight) hours as needed for muscle spasms. Take  mostly at bedtime  ? metoprolol succinate (TOPROL-XL) 50 MG 24 hr tablet   ? metoprolol tartrate (LOPRESSOR) 25 MG tablet Take 1 tablet (25 mg total) by mouth 2 (two) times daily.  ? mometasone (ELOCON) 0.1 % cream 1 application  ? Multiple Vitamins-Minerals (ICAPS LUTEIN & ZEAXANTHIN PO) Take by mouth.  ? omeprazole (PRILOSEC) 40 MG capsule Take 1 capsule by mouth daily.  ? ondansetron (ZOFRAN) 4 MG tablet ondansetron HCl 4 mg tablet ? TAKE 1 TABLET BY MOUTH TWICE DAILY AS NEEDED FOR NAUSEA  ? rosuvastatin (CRESTOR) 10 MG tablet   ? terbinafine (LAMISIL) 250 MG tablet Take 1 tablet (250 mg total) by mouth daily.  ? triamcinolone cream (KENALOG) 0.1 %   ? venlafaxine XR (EFFEXOR-XR) 75 MG 24 hr capsule TAKE ONE CAPSULE BY MOUTH ONCE DAILY WITH  BREAKFAST  ? ?No current facility-administered medications for this visit. (Other)  ? ?REVIEW OF SYSTEMS: ?ROS   ?Positive for: Eyes, Psychiatric ?Negative for: Constitutional, Gastrointestinal, Neurological, Skin, Genitourinary, Musculoskeletal, HENT, Endocrine, Cardiovascular, Respiratory, Allergic/Imm, Heme/Lymph ?Last edited by Annie Paras, COT on 04/21/2022  1:36 PM.  ?  ? ? ?ALLERGIES ?Allergies  ?Allergen Reactions  ? Levaquin [Levofloxacin In D5w] Nausea Only  ?  Dizziness and tremulousness  ? ?PAST MEDICAL HISTORY ?Past Medical History:  ?Diagnosis Date  ? Actinic keratosis 12/03/2012  ? Anxiety   ? Effexor helps--psychiatrist Dr  Toy Care  ? Arthritis   ? osteoarthritis, hands  ? Atypical chest pain   ? EF 86%, breast attenuation, no significant ischemia  ? Cervical radiculopathy 11/18/2013  ? Chronic headache   ? Diverticulosis   ? Erosive esophagitis   ? GERD (gastroesophageal reflux disease) 11/2009  ? EGD: Dr. Sharlett Iles: erosive stricture dilated  ? Internal hemorrhoids   ? ISCHEMIC COLITIS 06/05/2008  ? Qualifier: Diagnosis of  By: Nils Pyle CMA Deborra Medina), Mearl Latin    ? Migraines   ? 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 Osteoporosis 02/2012  ? t score -2.5 spine   ? Palpitations   ? Rotator cuff tendonitis 05/26/2011  ? 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 ?Past Surgical History:  ?Procedure Laterality Date  ? AUGMENTATION MAMMAPLASTY Bilateral   ? silicone gel implants  ? Birth mark removed    ? CATARACT EXTRACTION Bilateral   ? COLPOSCOPY    ? laser vaporization of upper vagina  1998  ? NM MYOCAR PERF WALL MOTION  01/24/2008  ? EF 86% neg ischemia  ? VAGINAL HYSTERECTOMY  1978  ? ? ?FAMILY HISTORY ?Family History  ?Problem Relation Age of Onset  ? Heart disease Mother   ? Heart failure Mother   ? Breast cancer Mother 77  ? Prostate cancer Father   ? Inflammatory bowel disease Sister   ? Diabetes Maternal Aunt   ? Colon cancer Paternal Grandfather   ? Stomach cancer Neg Hx   ? Pancreatic cancer Neg Hx   ? ?SOCIAL HISTORY ?Social History  ? ?Tobacco Use  ? Smoking status: Former  ?  Packs/day: 1.00  ?  Years: 10.00  ?  Pack years: 10.00  ?  Types: Cigarettes  ?  Quit date: 12/15/1988  ?  Years since quitting: 33.3  ? Smokeless tobacco: Never  ? Tobacco comments:  ?  quit in the 1980's  ?Vaping Use  ? Vaping Use: Never used  ?Substance Use Topics  ? Alcohol use: Yes  ?  Alcohol/week: 14.0 standard drinks  ?  Types: 14 Standard drinks or equivalent per week  ?  Comment: drinks wine every day; 2 drinks a day   ? Drug use: No  ?  ? ?  ?OPHTHALMIC EXAM: ? ?Base Eye Exam   ? ? Visual Acuity (Snellen - Linear)   ? ?   Right Left  ? Dist [place] 20/40 20/40  ? Dist ph [place] NI NI  ? ?  ?  ? ? Tonometry (Tonopen, 1:40 PM)   ? ?   Right Left  ? Pressure 19 20  ? ?  ?  ? ? Pupils   ? ?   Dark Light Shape React APD  ? Right 5 4 Round Brisk None  ? Left 5 4 Round Brisk None  ? ?  ?  ? ? Visual Fields   ? ?   Left Right  ?  Full   ? Restrictions  Partial inner superior temporal deficiency  ? ?  ?  ? ? Extraocular Movement   ? ?   Right Left  ?  Full Full  ? ?  ?  ? ? Neuro/Psych   ? ? Oriented x3: Yes  ? Mood/Affect: Normal  ? ?  ?  ? ? Dilation   ? ? Both eyes: 1.0%  Mydriacyl, 2.5% Phenylephrine @ 1:37 PM  ? ?  ?  ? ?  ? ?Slit Lamp and Fundus Exam   ? ?  Slit Lamp Exam   ? ?   Right Left  ? Lids/Lashes Dermatochalasis - upper lid, mild MGD Dermatochalasis - upper lid, mild MGD  ? Conjunctiva/Sclera nasal pingeucula White and quiet  ? Cornea arcus, trace PEE, well healed cataract wound arcus, trace PEE, well healed cataract wound  ? Anterior Chamber deep, clear, narrow temporal angle Deep and quiet  ? Iris Round and dilated Round and dilated  ? Lens PC IOL in good position, trace Posterior capsular opacification PC IOL in good position with open PC  ? Anterior Vitreous syneresis, Posterior vitreous detachment, vitreous condensations syneresis  ? ?  ?  ? ? Fundus Exam   ? ?   Right Left  ? Disc Pink and Sharp Pink and Sharp, +PPP ST rim  ? C/D Ratio 0.2 0.2  ? Macula Blunted foveal reflex, central edema / SRF - improved, no heme, +drusen, RPE mottling Blunted foveal reflex, Drusen, RPE mottling and clumping, No heme or edema  ? Vessels attenuated, Tortuous attenuated, mild tortuosity, mild AV crossing changes  ? Periphery Attached, mild midzonal drusen, no heme Attached, mild midzonal drusen, no heme  ? ?  ?  ? ?  ? ? ?IMAGING AND PROCEDURES  ?Imaging and Procedures for 04/21/2022 ? ?OCT, Retina - OU - Both Eyes   ? ?   ?Right Eye ?Quality was good. Central Foveal Thickness: 256. Progression has improved. Findings include abnormal foveal contour, subretinal fluid, no IRF, pigment epithelial detachment, retinal drusen (Interval improvement in SRF overlying central PED ).  ? ?Left Eye ?Quality was good. Central Foveal Thickness: 276. Progression has been stable. Findings include normal foveal contour, no IRF, no SRF, retinal drusen , outer retinal atrophy.  ? ?Notes ?*Images captured and stored on drive ? ?Diagnosis / Impression:  ?OD: Interval improvement in SRF overlying central PED  ?OS: non-exu ARMD  ? ?Clinical management:  ?See below ? ?Abbreviations: NFP - Normal foveal  profile. CME - cystoid macular edema. PED - pigment epithelial detachment. IRF - intraretinal fluid. SRF - subretinal fluid. EZ - ellipsoid zone. ERM - epiretinal membrane. ORA - outer retinal atrophy. ORT - o

## 2022-04-21 ENCOUNTER — Ambulatory Visit (INDEPENDENT_AMBULATORY_CARE_PROVIDER_SITE_OTHER): Payer: PPO | Admitting: Ophthalmology

## 2022-04-21 ENCOUNTER — Encounter (INDEPENDENT_AMBULATORY_CARE_PROVIDER_SITE_OTHER): Payer: Self-pay | Admitting: Ophthalmology

## 2022-04-21 DIAGNOSIS — H35033 Hypertensive retinopathy, bilateral: Secondary | ICD-10-CM | POA: Diagnosis not present

## 2022-04-21 DIAGNOSIS — H353122 Nonexudative age-related macular degeneration, left eye, intermediate dry stage: Secondary | ICD-10-CM | POA: Diagnosis not present

## 2022-04-21 DIAGNOSIS — Z961 Presence of intraocular lens: Secondary | ICD-10-CM

## 2022-04-21 DIAGNOSIS — I10 Essential (primary) hypertension: Secondary | ICD-10-CM

## 2022-04-21 DIAGNOSIS — H353211 Exudative age-related macular degeneration, right eye, with active choroidal neovascularization: Secondary | ICD-10-CM

## 2022-04-22 ENCOUNTER — Encounter (INDEPENDENT_AMBULATORY_CARE_PROVIDER_SITE_OTHER): Payer: Self-pay | Admitting: Ophthalmology

## 2022-04-22 MED ORDER — BEVACIZUMAB CHEMO INJECTION 1.25MG/0.05ML SYRINGE FOR KALEIDOSCOPE
1.2500 mg | INTRAVITREAL | Status: AC | PRN
  Administered 2022-04-22: 1.25 mg via INTRAVITREAL

## 2022-05-02 DIAGNOSIS — F411 Generalized anxiety disorder: Secondary | ICD-10-CM | POA: Diagnosis not present

## 2022-05-02 DIAGNOSIS — F3341 Major depressive disorder, recurrent, in partial remission: Secondary | ICD-10-CM | POA: Diagnosis not present

## 2022-05-02 DIAGNOSIS — R110 Nausea: Secondary | ICD-10-CM | POA: Diagnosis not present

## 2022-05-02 DIAGNOSIS — E559 Vitamin D deficiency, unspecified: Secondary | ICD-10-CM | POA: Diagnosis not present

## 2022-05-02 DIAGNOSIS — I10 Essential (primary) hypertension: Secondary | ICD-10-CM | POA: Diagnosis not present

## 2022-05-05 ENCOUNTER — Telehealth: Payer: Self-pay | Admitting: Nurse Practitioner

## 2022-05-05 NOTE — Telephone Encounter (Signed)
5/22 no answer line rings no voicemail

## 2022-05-05 NOTE — Telephone Encounter (Signed)
Patient called to schedule an OV for symptoms. Per patient, has diarrhea (took imodium) calmed down for a bit but came back. Patient also states having abd cramps and blood in stool. Patient is currently scheduled for a pre-visit 05/19/22. Patient wants to know what we are advising?

## 2022-05-07 ENCOUNTER — Encounter: Payer: PPO | Admitting: Gastroenterology

## 2022-05-07 NOTE — Telephone Encounter (Signed)
SECOND ATTEMPT: ° °LVM requesting returned call. °

## 2022-05-08 NOTE — Telephone Encounter (Signed)
FINAL ATTEMPT:  LVM requesting returned call 

## 2022-05-16 NOTE — Progress Notes (Signed)
[facility] Note  05/19/2022    CHIEF COMPLAINT Patient presents for Retina Follow Up   HISTORY OF PRESENT ILLNESS: [name] is a 81 y.o. female who presents to the clinic today for:   HPI     Retina Follow Up   Patient presents with  Wet AMD (IVA OS #2 (05.08.23)).  In both eyes.  This started months ago.  Duration of 4 weeks.  Since onset it is stable.  I, the attending physician,  performed the HPI with the patient and updated documentation appropriately.        Comments   Patient denies any vision changes at this time. She sees floaters every now and then.      Last edited by Bernarda Caffey, MD on 05/19/2022 11:54 PM.     Referring physician: Kristen Loader, [place],  [place] [address]  HISTORICAL INFORMATION:   Selected notes from the MEDICAL RECORD NUMBER Referred by Dr. Lucianne Lei for ex ARMD LEE:  Ocular Hx- PMH-    CURRENT MEDICATIONS: Current Outpatient Medications (Ophthalmic Drugs)  Medication Sig   cycloSPORINE (RESTASIS) 0.05 % ophthalmic emulsion Restasis 0.05 % eye drops in a dropperette   No current facility-administered medications for this visit. (Ophthalmic Drugs)   Current Outpatient Medications (Other)  Medication Sig   azelastine (ASTELIN) 0.1 % nasal spray USE TWO SPRAYS IN EACH NOSTRIL TWICE DAILY AS DIRECTED   Bilberry, Vaccinium myrtillus, (BILBERRY PO) Take 1 capsule by mouth daily.   Cholecalciferol (D3-1000 PO) Take by mouth.   conjugated estrogens (PREMARIN) vaginal cream Premarin 0.625 mg/gram vaginal cream   fluticasone (FLONASE) 50 MCG/ACT nasal spray Place 2 sprays into both nostrils daily.   ibuprofen (ADVIL) 800 MG tablet Take 1 tablet (800 mg total) by mouth every 8 (eight) hours as needed.   metoprolol succinate (TOPROL-XL) 50 MG 24 hr tablet    mometasone (ELOCON) 0.1 % cream 1 application   Multiple Vitamins-Minerals (ICAPS LUTEIN & ZEAXANTHIN PO) Take by mouth.   omeprazole  (PRILOSEC) 40 MG capsule Take 1 capsule by mouth daily.   terbinafine (LAMISIL) 250 MG tablet Take 1 tablet (250 mg total) by mouth daily.   triamcinolone cream (KENALOG) 0.1 %    venlafaxine XR (EFFEXOR-XR) 75 MG 24 hr capsule TAKE ONE CAPSULE BY MOUTH ONCE DAILY WITH  BREAKFAST   methocarbamol (ROBAXIN) 500 MG tablet Take 1 tablet (500 mg total) by mouth every 8 (eight) hours as needed for muscle spasms. Take mostly at bedtime (Patient not taking: Reported on 05/19/2022)   metoprolol tartrate (LOPRESSOR) 25 MG tablet Take 1 tablet (25 mg total) by mouth 2 (two) times daily. (Patient not taking: Reported on 05/19/2022)   ondansetron (ZOFRAN) 4 MG tablet ondansetron HCl 4 mg tablet  TAKE 1 TABLET BY MOUTH TWICE DAILY AS NEEDED FOR NAUSEA (Patient not taking: Reported on 05/19/2022)   rosuvastatin (CRESTOR) 10 MG tablet  (Patient not taking: Reported on 05/19/2022)   No current facility-administered medications for this visit. (Other)   REVIEW OF SYSTEMS: ROS   Positive for: Eyes, Psychiatric Negative for: Constitutional, Gastrointestinal, Neurological, Skin, Genitourinary, Musculoskeletal, HENT, Endocrine, Cardiovascular, Respiratory, Allergic/Imm, Heme/Lymph Last edited by Annie Paras, COT on 05/19/2022  1:16 PM.     ALLERGIES Allergies  Allergen Reactions   Levaquin [Levofloxacin In D5w] Nausea Only    Dizziness and tremulousness   PAST MEDICAL HISTORY Past Medical History:  Diagnosis Date   Actinic keratosis 12/03/2012  Anxiety    Effexor helps--psychiatrist Dr Toy Care   Arthritis    osteoarthritis, hands   Atypical chest pain    EF 86%, breast attenuation, no significant ischemia   Cervical radiculopathy 11/18/2013   Chronic headache    Diverticulosis    Erosive esophagitis    GERD (gastroesophageal reflux disease) 11/2009   EGD: Dr. 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   Past Surgical History:  Procedure Laterality Date   AUGMENTATION MAMMAPLASTY Bilateral    silicone gel implants   Birth mark removed     CATARACT EXTRACTION Bilateral    COLPOSCOPY     laser vaporization of upper vagina  1998   NM MYOCAR PERF WALL MOTION  01/24/2008   EF 86% neg ischemia   VAGINAL HYSTERECTOMY  1978   FAMILY HISTORY Family History  Problem Relation Age of Onset   Heart disease Mother    Heart failure Mother    Breast cancer Mother 23   Prostate cancer Father    Inflammatory bowel disease Sister    Diabetes Maternal Aunt    Colon cancer Paternal Grandfather    Stomach cancer Neg Hx    Pancreatic cancer Neg Hx    SOCIAL HISTORY Social History   Tobacco Use   Smoking status: Former    Packs/day: 1.00    Years: 10.00    Pack years: 10.00    Types: Cigarettes    Quit date: 12/15/1988    Years since quitting: 33.4   Smokeless tobacco: Never   Tobacco comments:    quit in the 1980's  Vaping Use   Vaping Use: Never used  Substance Use Topics   Alcohol use: Yes    Alcohol/week: 14.0 standard drinks    Types: 14 Standard drinks or equivalent per week    Comment: drinks wine every day; 2 drinks a day    Drug use: No       OPHTHALMIC EXAM:  Base Eye Exam     Visual Acuity (Snellen - Linear)       Right Left   Dist [place] 20/40 20/40   Dist ph [place] NI NI         Tonometry (Tonopen, 1:22 PM)       Right Left   Pressure 19 19         Pupils       Pupils Dark Light Shape React APD   Right PERRL 5 4 Round Brisk None   Left PERRL 5 4 Round Brisk None         Visual Fields       Left Right    Full    Restrictions  Partial inner superior temporal deficiency         Extraocular Movement       Right Left     Full, Ortho Full, Ortho         Neuro/Psych     Oriented x3: Yes   Mood/Affect: Normal         Dilation     Both eyes: 1.0% Mydriacyl, 2.5% Phenylephrine @ 1:19 PM  Slit Lamp and Fundus Exam     Slit Lamp Exam       Right Left   Lids/Lashes Dermatochalasis - upper lid, mild MGD Dermatochalasis - upper lid, mild MGD   Conjunctiva/Sclera nasal pingeucula White and quiet   Cornea arcus, trace PEE, well healed cataract wound arcus, trace PEE, well healed cataract wound   Anterior Chamber deep, clear, narrow temporal angle Deep and quiet   Iris Round and dilated Round and dilated   Lens PC IOL in good position, trace Posterior capsular opacification PC IOL in good position with open PC   Anterior Vitreous syneresis, Posterior vitreous detachment, vitreous condensations syneresis, Posterior vitreous detachment         Fundus Exam       Right Left   Disc Pink and Sharp Pink and Sharp, +PPP ST rim   C/D Ratio 0.2 0.2   Macula Blunted foveal reflex, central edema / SRF - improved, no heme, +drusen, RPE mottling Blunted foveal reflex, Drusen, RPE mottling and clumping, No heme or edema   Vessels attenuated, Tortuous attenuated, mild tortuosity, mild AV crossing changes   Periphery Attached, mild midzonal drusen, no heme Attached, mild midzonal drusen, no heme           IMAGING AND PROCEDURES  Imaging and Procedures for 05/19/2022  OCT, Retina - OU - Both Eyes       Right Eye Quality was good. Central Foveal Thickness: 216. Progression has improved. Findings include subretinal fluid, no IRF, pigment epithelial detachment, retinal drusen , normal foveal contour, intraretinal hyper-reflective material, outer retinal atrophy (Interval improvement in PED height; Stable improvement in SRF; +ORA).   Left Eye Quality was good. Central Foveal Thickness: 263. Progression has been stable. Findings include normal foveal contour, no IRF, no SRF, retinal drusen , outer  retinal atrophy.   Notes *Images captured and stored on drive  Diagnosis / Impression:  OD: Interval improvement in PED height; Stable improvement in SRF; +ORA OS: non-exu ARMD   Clinical management:  See below  Abbreviations: NFP - Normal foveal profile. CME - cystoid macular edema. PED - pigment epithelial detachment. IRF - intraretinal fluid. SRF - subretinal fluid. EZ - ellipsoid zone. ERM - epiretinal membrane. ORA - outer retinal atrophy. ORT - outer retinal tubulation. SRHM - subretinal hyper-reflective material. IRHM - intraretinal hyper-reflective material      Intravitreal Injection, Pharmacologic Agent - OD - Right Eye       Time Out 05/19/2022. 2:30 PM. Confirmed correct patient, procedure, site, and patient consented.   Anesthesia Topical anesthesia was used. Anesthetic medications included Lidocaine 2%, Proparacaine 0.5%.   Procedure Preparation included 5% betadine to ocular surface, eyelid speculum. A supplied needle was used.   Injection: 1.25 mg Bevacizumab 1.'25mg'$ /0.44m   Route: Intravitreal, Site: Right Eye   NDC: [id number] Lot: [id number]'$ , Expiration date: 07/01/2022   Post-op Post injection exam found visual acuity of at least counting fingers. The patient tolerated the procedure well. There were no complications. The patient received written and verbal post procedure care education. Post injection medications were not given.            ASSESSMENT/PLAN:    ICD-10-CM   1. Exudative age-related macular degeneration of right eye with active choroidal neovascularization (HCC)  H35.3211 OCT, Retina - OU - Both Eyes    Intravitreal Injection, Pharmacologic Agent - OD - Right Eye    Bevacizumab (AVASTIN) SOLN 1.25 mg    2. Intermediate stage nonexudative age-related macular degeneration of  left eye  H35.3122     3. Essential hypertension  I10     4. Hypertensive retinopathy of both eyes  H35.033     5. Pseudophakia, both eyes  Z96.1       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, right eye    - s/p IVA OD #1 (04.03.23), #2 (05.08.23)  - at initial visit, pt reported 1-2 mo history of central "spot" in vision OD  - OCT shows: Interval improvement in PED height; Stable improvement in SRF; +ORA  - FA (04.03.23) shows +CNV with mild late leakage IT fovea, no obvious smokestack or smoking dot  - pt reports high stress and +steroid use -- ?CSR component  - recommend IVA OD #3 today, 06.05.23  - pt wishes to be treated with IVA  - RBA of procedure discussed, questions answered - IVA OD informed consent obtained and signed on 04.03.23 - see procedure note  - f/u in 4 wks, DFE, OCT, possible injxn  2. Age related macular degeneration, non-exudative, left eye  - The incidence, anatomy, and pathology of dry AMD, risk of progression, and the AREDS and AREDS 2 study including smoking risks discussed with patient.  - Recommend amsler grid monitoring  - f/u 3 months DFE, OCT  3,4. Hypertensive retinopathy OU - discussed importance of tight BP control - monitor  5. Pseudophakia OU  - s/p CE/IOL (Dr. Elliot Dally)  - IOL in good position, doing well  - monitor  Ophthalmic Meds Ordered this visit:  Meds ordered this encounter  Medications   Bevacizumab (AVASTIN) SOLN 1.25 mg     Return in about 4 weeks (around 06/16/2022) for DFE, OCT, possible injection.  There are no Patient Instructions on file for this visit.  Explained the diagnoses, plan, and follow up with the patient and they expressed understanding.  Patient expressed understanding of the importance of proper follow up care.   This document serves as a record of services personally performed by Gardiner Sleeper, MD, PhD. It was created on their behalf by San Jetty. Owens Shark, OA an ophthalmic technician. The creation of this record is the provider's dictation and/or activities during the visit.    Electronically signed by: San Jetty. Owens Shark, [place] 06.05.2023 12:01 PM  This  document serves as a record of services personally performed by Gardiner Sleeper, MD, PhD. It was created on their behalf by Leonie Douglas, an ophthalmic technician. The creation of this record is the provider's dictation and/or activities during the visit.    Electronically signed by: Leonie Douglas COA, 05/21/22  12:01 PM   Gardiner Sleeper, M.D., Ph.D. Diseases & Surgery of the Retina and Vitreous [facility]  I have reviewed the above documentation for accuracy and completeness, and I agree with the above. Gardiner Sleeper, M.D., Ph.D. 05/21/22 12:01 PM    Abbreviations: M myopia (nearsighted); A astigmatism; H hyperopia (farsighted); P presbyopia; Mrx spectacle prescription;  CTL contact lenses; OD right eye; OS left eye; OU both eyes  XT exotropia; ET esotropia; PEK punctate epithelial keratitis; PEE punctate epithelial erosions; DES dry eye syndrome; MGD meibomian gland dysfunction; ATs artificial tears; PFAT's preservative free artificial tears; [place] nuclear sclerotic cataract; PSC posterior subcapsular cataract; ERM epi-retinal membrane; PVD posterior vitreous detachment; RD retinal detachment; DM diabetes mellitus; DR diabetic retinopathy; NPDR non-proliferative diabetic retinopathy; PDR proliferative diabetic retinopathy; CSME clinically significant macular edema; DME diabetic macular edema; dbh dot blot hemorrhages; CWS cotton wool spot; POAG primary open angle  glaucoma; C/D cup-to-disc ratio; HVF humphrey visual field; GVF goldmann visual field; OCT optical coherence tomography; IOP intraocular pressure; BRVO Branch retinal vein occlusion; CRVO central retinal vein occlusion; CRAO central retinal artery occlusion; BRAO branch retinal artery occlusion; RT retinal tear; SB scleral buckle; PPV pars plana vitrectomy; VH Vitreous hemorrhage; PRP panretinal laser photocoagulation; IVK intravitreal kenalog; VMT vitreomacular traction; MH Macular hole;  NVD neovascularization of  the disc; NVE neovascularization elsewhere; AREDS age related eye disease study; ARMD age related macular degeneration; POAG primary open angle glaucoma; EBMD epithelial/anterior basement membrane dystrophy; ACIOL anterior chamber intraocular lens; IOL intraocular lens; PCIOL posterior chamber intraocular lens; Phaco/IOL phacoemulsification with intraocular lens placement; [place] photorefractive keratectomy; LASIK laser assisted in situ keratomileusis; HTN hypertension; DM diabetes mellitus; COPD chronic obstructive pulmonary disease

## 2022-05-19 ENCOUNTER — Ambulatory Visit (INDEPENDENT_AMBULATORY_CARE_PROVIDER_SITE_OTHER): Payer: PPO | Admitting: Ophthalmology

## 2022-05-19 ENCOUNTER — Encounter (INDEPENDENT_AMBULATORY_CARE_PROVIDER_SITE_OTHER): Payer: Self-pay | Admitting: Ophthalmology

## 2022-05-19 DIAGNOSIS — H35033 Hypertensive retinopathy, bilateral: Secondary | ICD-10-CM | POA: Diagnosis not present

## 2022-05-19 DIAGNOSIS — H353211 Exudative age-related macular degeneration, right eye, with active choroidal neovascularization: Secondary | ICD-10-CM

## 2022-05-19 DIAGNOSIS — I10 Essential (primary) hypertension: Secondary | ICD-10-CM

## 2022-05-19 DIAGNOSIS — Z961 Presence of intraocular lens: Secondary | ICD-10-CM

## 2022-05-19 DIAGNOSIS — H353122 Nonexudative age-related macular degeneration, left eye, intermediate dry stage: Secondary | ICD-10-CM | POA: Diagnosis not present

## 2022-05-21 MED ORDER — BEVACIZUMAB CHEMO INJECTION 1.25MG/0.05ML SYRINGE FOR KALEIDOSCOPE
1.2500 mg | INTRAVITREAL | Status: AC | PRN
  Administered 2022-05-21: 1.25 mg via INTRAVITREAL

## 2022-05-22 ENCOUNTER — Ambulatory Visit (AMBULATORY_SURGERY_CENTER): Payer: PPO | Admitting: *Deleted

## 2022-05-22 VITALS — Ht 64.5 in | Wt 130.2 lb

## 2022-05-22 DIAGNOSIS — R112 Nausea with vomiting, unspecified: Secondary | ICD-10-CM

## 2022-05-22 DIAGNOSIS — R197 Diarrhea, unspecified: Secondary | ICD-10-CM

## 2022-05-22 NOTE — Progress Notes (Signed)
  No trouble with anesthesia, denies being told they were difficult to intubate, or hx/fam hx of malignant hyperthermia per pt   No egg or soy allergy  No home oxygen use   No medications for weight loss taken  Pt denies constipation issues  Pt states that she didn't have trouble taking prep with last procedure- she does have Zofran at home and instructed to take prior to taking prep if needed.  Understanding voiced

## 2022-05-22 NOTE — Progress Notes (Signed)
[facility] Note  05/23/2022    CHIEF COMPLAINT Patient presents for No chief complaint on file.   HISTORY OF PRESENT ILLNESS: [name] is a 81 y.o. female who presents to the clinic today for:   Pt states floaters have improved as of this morning, seem to be mostly gone.   Referring physician: Kristen Loader, FNP [address],  [place] [address]  HISTORICAL INFORMATION:   Selected notes from the MEDICAL RECORD NUMBER Referred by Dr. Lucianne Lei for ex ARMD LEE:  Ocular Hx- PMH-    CURRENT MEDICATIONS: Current Outpatient Medications (Ophthalmic Drugs)  Medication Sig   cycloSPORINE (RESTASIS) 0.05 % ophthalmic emulsion Restasis 0.05 % eye drops in a dropperette   No current facility-administered medications for this visit. (Ophthalmic Drugs)   Current Outpatient Medications (Other)  Medication Sig   azelastine (ASTELIN) 0.1 % nasal spray USE TWO SPRAYS IN EACH NOSTRIL TWICE DAILY AS DIRECTED (Patient taking differently: Use PRN)   Bilberry, Vaccinium myrtillus, (BILBERRY PO) Take 1 capsule by mouth daily.   Cholecalciferol (D3-1000 PO) Take by mouth.   conjugated estrogens (PREMARIN) vaginal cream Uses 14 days a month   fluticasone (FLONASE) 50 MCG/ACT nasal spray Place 2 sprays into both nostrils daily.   ibuprofen (ADVIL) 800 MG tablet Take 1 tablet (800 mg total) by mouth every 8 (eight) hours as needed.   Loperamide HCl (IMODIUM PO) Take by mouth. PRN   methocarbamol (ROBAXIN) 500 MG tablet Take 1 tablet (500 mg total) by mouth every 8 (eight) hours as needed for muscle spasms. Take mostly at bedtime (Patient not taking: Reported on 05/19/2022)   metoprolol succinate (TOPROL-XL) 50 MG 24 hr tablet daily. Takes for headaches   metoprolol tartrate (LOPRESSOR) 25 MG tablet Take 1 tablet (25 mg total) by mouth 2 (two) times daily. (Patient not taking: Reported on 05/19/2022)   mometasone (ELOCON) 0.1 % cream 1 application   Multiple  Vitamins-Minerals (ICAPS AREDS 2 PO) Take by mouth daily.   Multiple Vitamins-Minerals (ICAPS LUTEIN & ZEAXANTHIN PO) Take by mouth.   omeprazole (PRILOSEC) 40 MG capsule Take 1 capsule by mouth daily.   ondansetron (ZOFRAN) 4 MG tablet Takes PRN   rosuvastatin (CRESTOR) 10 MG tablet  (Patient not taking: Reported on 05/19/2022)   terbinafine (LAMISIL) 250 MG tablet Take 1 tablet (250 mg total) by mouth daily.   triamcinolone cream (KENALOG) 0.1 %    venlafaxine XR (EFFEXOR-XR) 75 MG 24 hr capsule TAKE ONE CAPSULE BY MOUTH ONCE DAILY WITH  BREAKFAST   No current facility-administered medications for this visit. (Other)   REVIEW OF SYSTEMS:   ALLERGIES Allergies  Allergen Reactions   Levaquin [Levofloxacin In D5w] Nausea Only    Dizziness and tremulousness   PAST MEDICAL HISTORY Past Medical History:  Diagnosis Date   Actinic keratosis 12/03/2012   Allergy    Anxiety    Effexor helps--psychiatrist Dr Toy Care   Arthritis    osteoarthritis, hands   Atypical chest pain    EF 86%, breast attenuation, no significant ischemia   Cataract    Cervical radiculopathy 11/18/2013   Chronic headache    takes Metoprolol for this   Diverticulosis    Erosive esophagitis    GERD (gastroesophageal reflux disease) 11/2009   EGD: Dr. 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   Past Surgical History:  Procedure Laterality Date   AUGMENTATION MAMMAPLASTY Bilateral    silicone gel implants   Birth mark removed     CATARACT EXTRACTION Bilateral    COLONOSCOPY     COLPOSCOPY     laser vaporization of upper vagina  1998   NM MYOCAR PERF WALL MOTION   01/24/2008   EF 86% neg ischemia   VAGINAL HYSTERECTOMY  1978   FAMILY HISTORY Family History  Problem Relation Age of Onset   Heart disease Mother    Heart failure Mother    Breast cancer Mother 71   Prostate cancer Father    Inflammatory bowel disease Sister    Diabetes Maternal Aunt    Colon cancer Paternal Grandfather    Stomach cancer Neg Hx    Pancreatic cancer Neg Hx    Esophageal cancer Neg Hx    Rectal cancer Neg Hx    SOCIAL HISTORY Social History   Tobacco Use   Smoking status: Former    Packs/day: 1.00    Years: 10.00    Total pack years: 10.00    Types: Cigarettes    Quit date: 12/15/1988    Years since quitting: 33.4   Smokeless tobacco: Never   Tobacco comments:    quit in the 1980's  Vaping Use   Vaping Use: Never used  Substance Use Topics   Alcohol use: Yes    Alcohol/week: 14.0 standard drinks of alcohol    Types: 14 Standard drinks or equivalent per week    Comment: drinks wine every day; 2 drinks a day    Drug use: No       OPHTHALMIC EXAM:  Not recorded    IMAGING AND PROCEDURES  Imaging and Procedures for 05/23/2022          ASSESSMENT/PLAN:  **Pt presents acutely today 06.09.23 for floaters and lights in OD since inj on 06.05.23**  No diagnosis found.  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, right eye    - s/p IVA OD #1 (04.03.23), #2 (05.08.23), #3 (06.05.23)  - at initial visit, pt reported 1-2 mo history of central "spot" in vision OD  - OCT shows: Interval improvement in cystic changes, PED height; Stable improvement in SRF; +ORA  - FA (04.03.23) shows +CNV with mild late leakage IT fovea, no obvious smokestack or smoking dot  - pt reports high stress and +steroid use -- ?CSR component  - recommend IVA OD #3 today, 06.05.23  - pt wishes to be treated with IVA  - RBA of procedure discussed, questions answered - IVA OD informed consent obtained and signed on 04.03.23 - see procedure note  - f/u in 4 wks, DFE, OCT,  possible injxn  2. Age related macular degeneration, non-exudative, left eye  - The incidence, anatomy, and pathology of dry AMD, risk of progression, and the AREDS and AREDS 2 study including smoking risks discussed with patient.  - Recommend amsler grid monitoring  - f/u 3 months DFE, OCT  3,4. Hypertensive retinopathy OU - discussed importance of tight BP control - monitor  5. Pseudophakia OU  - s/p CE/IOL (Dr. Elliot Dally)  - IOL in good position, doing well  - monitor  Ophthalmic Meds Ordered this visit:  No orders of  the defined types were placed in this encounter.    No follow-ups on file.  There are no Patient Instructions on file for this visit.  Explained the diagnoses, plan, and follow up with the patient and they expressed understanding.  Patient expressed understanding of the importance of proper follow up care.   This document serves as a record of services personally performed by Gardiner Sleeper, MD, PhD. It was created on their behalf by Orvan Falconer, an ophthalmic technician. The creation of this record is the provider's dictation and/or activities during the visit.    Electronically signed by: Orvan Falconer, OA, 05/22/22  2:51 PM    Gardiner Sleeper, M.D., Ph.D. Diseases & Surgery of the Retina and Vitreous [facility]  I have reviewed the above documentation for accuracy and completeness, and I agree with the above. Gardiner Sleeper, M.D., Ph.D. 05/21/22 2:51 PM    Abbreviations: M myopia (nearsighted); A astigmatism; H hyperopia (farsighted); P presbyopia; Mrx spectacle prescription;  CTL contact lenses; OD right eye; OS left eye; OU both eyes  XT exotropia; ET esotropia; PEK punctate epithelial keratitis; PEE punctate epithelial erosions; DES dry eye syndrome; MGD meibomian gland dysfunction; ATs artificial tears; PFAT's preservative free artificial tears; [place] nuclear sclerotic cataract; PSC posterior subcapsular cataract; ERM  epi-retinal membrane; PVD posterior vitreous detachment; RD retinal detachment; DM diabetes mellitus; DR diabetic retinopathy; NPDR non-proliferative diabetic retinopathy; PDR proliferative diabetic retinopathy; CSME clinically significant macular edema; DME diabetic macular edema; dbh dot blot hemorrhages; CWS cotton wool spot; POAG primary open angle glaucoma; C/D cup-to-disc ratio; HVF humphrey visual field; GVF goldmann visual field; OCT optical coherence tomography; IOP intraocular pressure; BRVO Branch retinal vein occlusion; CRVO central retinal vein occlusion; CRAO central retinal artery occlusion; BRAO branch retinal artery occlusion; RT retinal tear; SB scleral buckle; PPV pars plana vitrectomy; VH Vitreous hemorrhage; PRP panretinal laser photocoagulation; IVK intravitreal kenalog; VMT vitreomacular traction; MH Macular hole;  NVD neovascularization of the disc; NVE neovascularization elsewhere; AREDS age related eye disease study; ARMD age related macular degeneration; POAG primary open angle glaucoma; EBMD epithelial/anterior basement membrane dystrophy; ACIOL anterior chamber intraocular lens; IOL intraocular lens; PCIOL posterior chamber intraocular lens; Phaco/IOL phacoemulsification with intraocular lens placement; [place] photorefractive keratectomy; LASIK laser assisted in situ keratomileusis; HTN hypertension; DM diabetes mellitus; COPD chronic obstructive pulmonary disease

## 2022-05-23 ENCOUNTER — Ambulatory Visit (INDEPENDENT_AMBULATORY_CARE_PROVIDER_SITE_OTHER): Payer: PPO | Admitting: Ophthalmology

## 2022-05-23 ENCOUNTER — Encounter (INDEPENDENT_AMBULATORY_CARE_PROVIDER_SITE_OTHER): Payer: Self-pay | Admitting: Ophthalmology

## 2022-05-23 DIAGNOSIS — H35033 Hypertensive retinopathy, bilateral: Secondary | ICD-10-CM | POA: Diagnosis not present

## 2022-05-23 DIAGNOSIS — H353122 Nonexudative age-related macular degeneration, left eye, intermediate dry stage: Secondary | ICD-10-CM | POA: Diagnosis not present

## 2022-05-23 DIAGNOSIS — H353211 Exudative age-related macular degeneration, right eye, with active choroidal neovascularization: Secondary | ICD-10-CM | POA: Diagnosis not present

## 2022-05-23 DIAGNOSIS — I10 Essential (primary) hypertension: Secondary | ICD-10-CM | POA: Diagnosis not present

## 2022-05-23 DIAGNOSIS — Z961 Presence of intraocular lens: Secondary | ICD-10-CM

## 2022-05-24 ENCOUNTER — Encounter (INDEPENDENT_AMBULATORY_CARE_PROVIDER_SITE_OTHER): Payer: Self-pay | Admitting: Ophthalmology

## 2022-06-09 ENCOUNTER — Emergency Department (HOSPITAL_COMMUNITY): Payer: PPO

## 2022-06-09 ENCOUNTER — Telehealth: Payer: Self-pay

## 2022-06-09 ENCOUNTER — Other Ambulatory Visit: Payer: Self-pay

## 2022-06-09 ENCOUNTER — Encounter (HOSPITAL_COMMUNITY): Payer: Self-pay | Admitting: Emergency Medicine

## 2022-06-09 ENCOUNTER — Emergency Department (HOSPITAL_COMMUNITY)
Admission: EM | Admit: 2022-06-09 | Discharge: 2022-06-09 | Disposition: A | Discharge status: Home / Self Care | Payer: PPO | Attending: Emergency Medicine | Admitting: Emergency Medicine

## 2022-06-09 DIAGNOSIS — K5792 Diverticulitis of intestine, part unspecified, without perforation or abscess without bleeding: Secondary | ICD-10-CM

## 2022-06-09 DIAGNOSIS — R109 Unspecified abdominal pain: Secondary | ICD-10-CM | POA: Diagnosis not present

## 2022-06-09 DIAGNOSIS — R1032 Left lower quadrant pain: Secondary | ICD-10-CM | POA: Diagnosis present

## 2022-06-09 DIAGNOSIS — K5732 Diverticulitis of large intestine without perforation or abscess without bleeding: Secondary | ICD-10-CM | POA: Diagnosis not present

## 2022-06-09 DIAGNOSIS — I700 Atherosclerosis of aorta: Secondary | ICD-10-CM | POA: Diagnosis not present

## 2022-06-09 LAB — CBC
HCT: 38.3 % (ref 36.0–46.0)
Hemoglobin: 13.1 g/dL (ref 12.0–15.0)
MCH: 31.9 pg (ref 26.0–34.0)
MCHC: 34.2 g/dL (ref 30.0–36.0)
MCV: 93.2 fL (ref 80.0–100.0)
Platelets: 189 10*3/uL (ref 150–400)
RBC: 4.11 MIL/uL (ref 3.87–5.11)
RDW: 12.9 % (ref 11.5–15.5)
WBC: 8.9 10*3/uL (ref 4.0–10.5)
nRBC: 0 % (ref 0.0–0.2)

## 2022-06-09 LAB — COMPREHENSIVE METABOLIC PANEL
ALT: 20 U/L (ref 0–44)
AST: 23 U/L (ref 15–41)
Albumin: 3.8 g/dL (ref 3.5–5.0)
Alkaline Phosphatase: 85 U/L (ref 38–126)
Anion gap: 9 (ref 5–15)
BUN: 9 mg/dL (ref 8–23)
CO2: 23 mmol/L (ref 22–32)
Calcium: 9.2 mg/dL (ref 8.9–10.3)
Chloride: 107 mmol/L (ref 98–111)
Creatinine, Ser: 0.65 mg/dL (ref 0.44–1.00)
GFR, Estimated: >60 mL/min (ref >60)
Glucose, Bld: 130 mg/dL — ABNORMAL HIGH (ref 70–99)
Potassium: 3.7 mmol/L (ref 3.5–5.1)
Sodium: 139 mmol/L (ref 135–145)
Total Bilirubin: 1 mg/dL (ref 0.3–1.2)
Total Protein: 7.4 g/dL (ref 6.5–8.1)

## 2022-06-09 LAB — URINALYSIS, ROUTINE W REFLEX MICROSCOPIC
Bilirubin Urine: NEGATIVE
Glucose, UA: NEGATIVE mg/dL
Hgb urine dipstick: NEGATIVE
Ketones, ur: NEGATIVE mg/dL
Nitrite: NEGATIVE
Protein, ur: NEGATIVE mg/dL
Specific Gravity, Urine: 1.01 (ref 1.005–1.030)
pH: 8 (ref 5.0–8.0)

## 2022-06-09 LAB — LIPASE, BLOOD: Lipase: 28 U/L (ref 11–51)

## 2022-06-09 LAB — C-REACTIVE PROTEIN: CRP: 5.5 mg/dL — ABNORMAL HIGH (ref <1.0)

## 2022-06-09 MED ORDER — IOHEXOL 300 MG/ML  SOLN
75.0000 mL | Freq: Once | INTRAMUSCULAR | Status: AC | PRN
  Administered 2022-06-09: 75 mL via INTRAVENOUS

## 2022-06-09 MED ORDER — MORPHINE SULFATE (PF) 4 MG/ML IV SOLN
4.0000 mg | Freq: Once | INTRAVENOUS | Status: AC
  Administered 2022-06-09: 4 mg via INTRAVENOUS
  Filled 2022-06-09: qty 1

## 2022-06-09 MED ORDER — SODIUM CHLORIDE 0.9 % IV BOLUS
1000.0000 mL | Freq: Once | INTRAVENOUS | Status: AC
  Administered 2022-06-09: 1000 mL via INTRAVENOUS

## 2022-06-09 MED ORDER — AMOXICILLIN-POT CLAVULANATE 875-125 MG PO TABS: 1.0000 | ORAL_TABLET | Freq: Two times a day (BID) | ORAL | 0 refills | Status: DC

## 2022-06-09 MED ORDER — SODIUM CHLORIDE (PF) 0.9 % IJ SOLN
INTRAMUSCULAR | Status: AC
  Filled 2022-06-09: qty 50

## 2022-06-09 MED ORDER — IOHEXOL 300 MG/ML  SOLN
100.0000 mL | Freq: Once | INTRAMUSCULAR | Status: AC | PRN
  Administered 2022-06-09: 80 mL via INTRAVENOUS

## 2022-06-09 MED ORDER — ONDANSETRON HCL 4 MG/2ML IJ SOLN
4.0000 mg | Freq: Once | INTRAMUSCULAR | Status: AC
  Administered 2022-06-09: 4 mg via INTRAVENOUS
  Filled 2022-06-09: qty 2

## 2022-06-11 ENCOUNTER — Encounter: Payer: Self-pay | Admitting: Gastroenterology

## 2022-06-12 DIAGNOSIS — K5792 Diverticulitis of intestine, part unspecified, without perforation or abscess without bleeding: Secondary | ICD-10-CM | POA: Diagnosis not present

## 2022-06-16 ENCOUNTER — Ambulatory Visit (AMBULATORY_SURGERY_CENTER): Payer: PPO | Admitting: Gastroenterology

## 2022-06-16 ENCOUNTER — Encounter: Payer: Self-pay | Admitting: Gastroenterology

## 2022-06-16 ENCOUNTER — Encounter (INDEPENDENT_AMBULATORY_CARE_PROVIDER_SITE_OTHER): Payer: PPO | Admitting: Ophthalmology

## 2022-06-16 VITALS — BP 121/65 | HR 13 | Temp 97.2°F | Resp 13 | Ht 64.5 in | Wt 130.2 lb

## 2022-06-16 DIAGNOSIS — R112 Nausea with vomiting, unspecified: Secondary | ICD-10-CM | POA: Diagnosis not present

## 2022-06-16 DIAGNOSIS — K2900 Acute gastritis without bleeding: Secondary | ICD-10-CM | POA: Diagnosis not present

## 2022-06-16 DIAGNOSIS — K2951 Unspecified chronic gastritis with bleeding: Secondary | ICD-10-CM | POA: Diagnosis not present

## 2022-06-16 DIAGNOSIS — R197 Diarrhea, unspecified: Secondary | ICD-10-CM

## 2022-06-16 MED ORDER — SODIUM CHLORIDE 0.9 % IV SOLN: 500.0000 mL | INTRAVENOUS | Status: DC

## 2022-06-16 NOTE — Progress Notes (Signed)
Called to room to assist during endoscopic procedure.  Patient ID and intended procedure confirmed with present staff. Received instructions for my participation in the procedure from the performing physician.  

## 2022-06-16 NOTE — Op Note (Signed)
[place] Patient Name: [name] Procedure Date: 06/16/2022 9:05 AM MRN: [id number] Endoscopist: Milus Banister , MD Age: 81 Referring MD:  Date of Birth: Mar 09, 1941 Gender: Female Account #: [id number] Procedure:                Upper GI endoscopy Indications:              Nausea with vomiting Medicines:                Monitored Anesthesia Care Procedure:                Pre-Anesthesia Assessment:                           - Prior to the procedure, a History and Physical                            was performed, and patient medications and                            allergies were reviewed. The patient's tolerance of                            previous anesthesia was also reviewed. The risks                            and benefits of the procedure and the sedation                            options and risks were discussed with the patient.                            All questions were answered, and informed consent                            was obtained. Prior Anticoagulants: The patient has                            taken no previous anticoagulant or antiplatelet                            agents. ASA Grade Assessment: II - A patient with                            mild systemic disease. After reviewing the risks                            and benefits, the patient was deemed in                            satisfactory condition to undergo the procedure.                           After obtaining informed consent, the endoscope was  passed under direct vision. Throughout the                            procedure, the patient's blood pressure, pulse, and                            oxygen saturations were monitored continuously. The                            GIF [id number] was introduced through the                            mouth, and advanced to the second part of duodenum.                            The upper GI endoscopy was  accomplished without                            difficulty. The patient tolerated the procedure                            well. Scope In: Scope Out: Findings:                 Mild inflammation characterized by erythema was                            found in the gastric antrum. Biopsies were taken                            with a cold forceps for histology.                           The exam was otherwise without abnormality. Complications:            No immediate complications. Estimated blood loss:                            None. Estimated Blood Loss:     Estimated blood loss: none. Impression:               - Mild, non-specific gastritis. Biopsied to check                            for H. pylori.                           - The examination was otherwise normal. Recommendation:           - Patient has a contact number available for                            emergencies. The signs and symptoms of potential                            delayed complications were discussed with the  patient. Return to normal activities tomorrow.                            Written discharge instructions were provided to the                            patient.                           - Resume previous diet.                           - Continue present medications.                           - Await pathology results.                           - Reschedule colonoscopy for about 4 weeks from now                            to allow the usual healing of diveriticulitis. Milus Banister, MD 06/16/2022 9:22:12 AM This report has been signed electronically.

## 2022-06-16 NOTE — Progress Notes (Signed)
Report to pacu rn; vss. Care resumed by rn. 

## 2022-06-16 NOTE — Progress Notes (Signed)
HPI: This is a she is here for EGD and colonoscopy.  Recently diagnosed with acute left sided diverticulitis, last week. I never heard about this new diagnosis. She completed her abx, feels overall better.  EGd is for n/vomiting  ROS: complete GI ROS as described in HPI, all other review negative.  Constitutional:  No unintentional weight loss   Past Medical History:  Diagnosis Date   Actinic keratosis 12/03/2012   Allergy    Anxiety    Effexor helps--psychiatrist Dr Toy Care   Arthritis    osteoarthritis, hands   Atypical chest pain    EF 86%, breast attenuation, no significant ischemia   Cataract    Cervical radiculopathy 11/18/2013   Chronic headache    takes Metoprolol for this   Diverticulosis    Erosive esophagitis    GERD (gastroesophageal reflux disease) 11/2009   EGD: Dr. 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 14 tablet 0   Bilberry, Vaccinium myrtillus, (BILBERRY PO) Take 1 capsule by mouth daily.     Cholecalciferol (D3-1000 PO) Take by mouth.     cycloSPORINE (RESTASIS) 0.05 % ophthalmic emulsion Restasis 0.05 %  eye drops in a dropperette     Loperamide HCl (IMODIUM PO) Take by mouth. PRN     metoprolol succinate (TOPROL-XL) 50 MG 24 hr tablet daily. Takes for headaches     omeprazole (PRILOSEC) 40 MG capsule Take 1 capsule by mouth daily.     venlafaxine XR (EFFEXOR-XR) 75 MG 24 hr capsule TAKE ONE CAPSULE BY MOUTH ONCE DAILY WITH  BREAKFAST 90 capsule 3   azelastine (ASTELIN) 0.1 % nasal spray USE TWO SPRAYS IN EACH NOSTRIL TWICE DAILY AS DIRECTED (Patient taking differently: Use PRN) 30 mL 5   conjugated estrogens (PREMARIN) vaginal cream Uses 14 days a month     fluticasone (FLONASE) 50 MCG/ACT nasal spray Place 2 sprays into both nostrils daily. 16 g 6   ibuprofen (ADVIL) 800 MG tablet Take 1 tablet (800 mg total) by mouth every 8 (eight) hours as needed. 30 tablet 0   methocarbamol (ROBAXIN) 500 MG tablet Take 1 tablet (500 mg total) by mouth every 8 (eight) hours as needed for muscle spasms. Take mostly at bedtime 60 tablet 1   mometasone (ELOCON) 0.1 % cream 1 application     Multiple Vitamins-Minerals (ICAPS AREDS 2 PO) Take by mouth daily.     Multiple Vitamins-Minerals (ICAPS LUTEIN & ZEAXANTHIN PO) Take by mouth.     ondansetron (ZOFRAN) 4 MG tablet Takes PRN  rosuvastatin (CRESTOR) 10 MG tablet  (Patient not taking: Reported on 05/19/2022)     terbinafine (LAMISIL) 250 MG tablet Take 1 tablet (250 mg total) by mouth daily. 90 tablet 0   triamcinolone cream (KENALOG) 0.1 %      Current Facility-Administered Medications  Medication Dose Route Frequency Provider Last Rate Last Admin   0.9 %  sodium chloride infusion  500 mL Intravenous Continuous Milus Banister, MD        Allergies as of 06/16/2022 - Review Complete 06/16/2022  Allergen Reaction Noted   Levaquin [levofloxacin in d5w] Nausea Only 12/01/2013    Family History  Problem Relation Age of Onset   Heart disease Mother    Heart failure Mother    Breast cancer Mother 30   Prostate cancer Father    Inflammatory bowel  disease Sister    Diabetes Maternal Aunt    Colon cancer Paternal Grandfather    Stomach cancer Neg Hx    Pancreatic cancer Neg Hx    Esophageal cancer Neg Hx    Rectal cancer Neg Hx     Social History   Socioeconomic History   Marital status: Married    Spouse name: Not on file   Number of children: 1   Years of education: Not on file   Highest education level: Not on file  Occupational History   Occupation: retired  Tobacco Use   Smoking status: Former    Packs/day: 1.00    Years: 10.00    Total pack years: 10.00    Types: Cigarettes    Quit date: 12/15/1988    Years since quitting: 33.5   Smokeless tobacco: Never   Tobacco comments:    quit in the 1980's  Vaping Use   Vaping Use: Never used  Substance and Sexual Activity   Alcohol use: Yes    Alcohol/week: 14.0 standard drinks of alcohol    Types: 14 Standard drinks or equivalent per week    Comment: drinks wine every day; 2 drinks a day    Drug use: No   Sexual activity: Never    Birth control/protection: Surgical, Post-menopausal    Comment: HYST  Other Topics Concern   Not on file  Social History Narrative   Married, mother of one.  Lives with spouse and 4 dogs.   Very active. Regular exercise  -- yes.  Quit smoking in 1990.     She monitors her diet well.     1-2 glasses of wine almost every night.   Social Determinants of Health   Financial Resource Strain: Not on file  Food Insecurity: Not on file  Transportation Needs: Not on file  Physical Activity: Not on file  Stress: Not on file  Social Connections: Not on file  Intimate Partner Violence: Not on file     Physical Exam: BP 111/78   Pulse 72   Temp (!) 97.2 F (36.2 C) (Temporal)   Ht 5' 4.5" (1.638 m)   Wt 130 lb 3.2 oz (59.1 kg)   SpO2 98%   BMI 22.00 kg/m  Constitutional: generally well-appearing Psychiatric: alert and oriented x3 Lungs: CTA bilaterally Heart: no MCR  Assessment and plan: 81 y.o. female with nausease, vomiting,  recent ct proven left sided diverticulitis.   She understands it's risky to have colonscopy so soon after acute diverticulitis.  Will proceed with egd today.  Care is appropriate for the ambulatory setting.  Owens Loffler, MD [facility] 06/16/2022, 9:01 AM

## 2022-06-18 ENCOUNTER — Telehealth: Payer: Self-pay

## 2022-06-18 NOTE — Telephone Encounter (Signed)
Attempted f/u call. No answer, Left VM.

## 2022-06-20 ENCOUNTER — Encounter (INDEPENDENT_AMBULATORY_CARE_PROVIDER_SITE_OTHER): Payer: PPO | Admitting: Ophthalmology

## 2022-06-20 ENCOUNTER — Encounter (INDEPENDENT_AMBULATORY_CARE_PROVIDER_SITE_OTHER): Payer: Self-pay

## 2022-06-20 DIAGNOSIS — H353211 Exudative age-related macular degeneration, right eye, with active choroidal neovascularization: Secondary | ICD-10-CM

## 2022-06-20 DIAGNOSIS — H35033 Hypertensive retinopathy, bilateral: Secondary | ICD-10-CM

## 2022-06-20 DIAGNOSIS — Z961 Presence of intraocular lens: Secondary | ICD-10-CM

## 2022-06-20 DIAGNOSIS — I10 Essential (primary) hypertension: Secondary | ICD-10-CM

## 2022-06-20 DIAGNOSIS — H353122 Nonexudative age-related macular degeneration, left eye, intermediate dry stage: Secondary | ICD-10-CM

## 2022-06-23 ENCOUNTER — Encounter: Payer: Self-pay | Admitting: Gastroenterology

## 2022-06-24 ENCOUNTER — Ambulatory Visit (INDEPENDENT_AMBULATORY_CARE_PROVIDER_SITE_OTHER): Payer: PPO | Admitting: Ophthalmology

## 2022-06-24 ENCOUNTER — Encounter (INDEPENDENT_AMBULATORY_CARE_PROVIDER_SITE_OTHER): Payer: Self-pay | Admitting: Ophthalmology

## 2022-06-24 DIAGNOSIS — I10 Essential (primary) hypertension: Secondary | ICD-10-CM

## 2022-06-24 DIAGNOSIS — H353211 Exudative age-related macular degeneration, right eye, with active choroidal neovascularization: Secondary | ICD-10-CM | POA: Diagnosis not present

## 2022-06-24 DIAGNOSIS — Z961 Presence of intraocular lens: Secondary | ICD-10-CM

## 2022-06-24 DIAGNOSIS — H35033 Hypertensive retinopathy, bilateral: Secondary | ICD-10-CM | POA: Diagnosis not present

## 2022-06-24 DIAGNOSIS — H353122 Nonexudative age-related macular degeneration, left eye, intermediate dry stage: Secondary | ICD-10-CM | POA: Diagnosis not present

## 2022-06-24 MED ORDER — BEVACIZUMAB CHEMO INJECTION 1.25MG/0.05ML SYRINGE FOR KALEIDOSCOPE
1.2500 mg | INTRAVITREAL | Status: AC | PRN
  Administered 2022-06-24: 1.25 mg via INTRAVITREAL

## 2022-06-24 NOTE — Progress Notes (Signed)
[facility] Note  06/24/2022    CHIEF COMPLAINT Patient presents for Retina Follow Up   HISTORY OF PRESENT ILLNESS: [name] is a 81 y.o. female who presents to the clinic today for:   HPI     Retina Follow Up   Patient presents with  Wet AMD.  In right eye.  This started 4 weeks ago.  I, the attending physician,  performed the HPI with the patient and updated documentation appropriately.        Comments   Patient here for 4 weeks retina follow up for exu ARMD OD. Patient states vision has floaters in OD since had last shot. Has not had floaters before. OD tears up. No eye pain.       Last edited by Bernarda Caffey, MD on 06/24/2022  4:20 PM.    Patient states that her vision is good.   Referring physician: Kristen Loader, FNP [address],  [place] [address]  HISTORICAL INFORMATION:   Selected notes from the MEDICAL RECORD NUMBER Referred by Dr. Lucianne Lei for ex ARMD LEE:  Ocular Hx- PMH-    CURRENT MEDICATIONS: Current Outpatient Medications (Ophthalmic Drugs)  Medication Sig   cycloSPORINE (RESTASIS) 0.05 % ophthalmic emulsion Restasis 0.05 % eye drops in a dropperette   No current facility-administered medications for this visit. (Ophthalmic Drugs)   Current Outpatient Medications (Other)  Medication Sig   amoxicillin-clavulanate (AUGMENTIN) 875-125 MG tablet Take 1 tablet by mouth every 12 (twelve) hours.   azelastine (ASTELIN) 0.1 % nasal spray USE TWO SPRAYS IN EACH NOSTRIL TWICE DAILY AS DIRECTED (Patient taking differently: Use PRN)   Bilberry, Vaccinium myrtillus, (BILBERRY PO) Take 1 capsule by mouth daily.   Cholecalciferol (D3-1000 PO) Take by mouth.   conjugated estrogens (PREMARIN) vaginal cream Uses 14 days a month   fluticasone (FLONASE) 50 MCG/ACT nasal spray Place 2 sprays into both nostrils daily.   ibuprofen (ADVIL) 800 MG tablet Take 1 tablet (800 mg total) by mouth every 8 (eight) hours as needed.    Loperamide HCl (IMODIUM PO) Take by mouth. PRN   methocarbamol (ROBAXIN) 500 MG tablet Take 1 tablet (500 mg total) by mouth every 8 (eight) hours as needed for muscle spasms. Take mostly at bedtime   metoprolol succinate (TOPROL-XL) 50 MG 24 hr tablet daily. Takes for headaches   mometasone (ELOCON) 0.1 % cream 1 application   Multiple Vitamins-Minerals (ICAPS AREDS 2 PO) Take by mouth daily.   Multiple Vitamins-Minerals (ICAPS LUTEIN & ZEAXANTHIN PO) Take by mouth.   omeprazole (PRILOSEC) 40 MG capsule Take 1 capsule by mouth daily.   ondansetron (ZOFRAN) 4 MG tablet Takes PRN   terbinafine (LAMISIL) 250 MG tablet Take 1 tablet (250 mg total) by mouth daily.   triamcinolone cream (KENALOG) 0.1 %    venlafaxine XR (EFFEXOR-XR) 75 MG 24 hr capsule TAKE ONE CAPSULE BY MOUTH ONCE DAILY WITH  BREAKFAST   rosuvastatin (CRESTOR) 10 MG tablet  (Patient not taking: Reported on 05/19/2022)   No current facility-administered medications for this visit. (Other)   REVIEW OF SYSTEMS: ROS   Positive for: Eyes, Psychiatric Negative for: Constitutional, Gastrointestinal, Neurological, Skin, Genitourinary, Musculoskeletal, HENT, Endocrine, Cardiovascular, Respiratory, Allergic/Imm, Heme/Lymph Last edited by Theodore Demark, COA on 06/24/2022  1:31 PM.     ALLERGIES Allergies  Allergen Reactions   Levaquin [Levofloxacin In D5w] Nausea Only    Dizziness and tremulousness   PAST MEDICAL HISTORY Past Medical History:  Diagnosis Date   Actinic keratosis 12/03/2012   Allergy    Anxiety    Effexor helps--psychiatrist Dr Toy Care   Arthritis    osteoarthritis, hands   Atypical chest pain    EF 86%, breast attenuation, no significant ischemia   Cataract    Cervical radiculopathy 11/18/2013   Chronic headache    takes Metoprolol for this   Diverticulosis    Erosive esophagitis    GERD (gastroesophageal reflux disease) 11/2009   EGD: Dr. 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   Past Surgical History:  Procedure Laterality Date   AUGMENTATION MAMMAPLASTY Bilateral    silicone gel implants   Birth mark removed     CATARACT EXTRACTION Bilateral    COLONOSCOPY     COLPOSCOPY     laser vaporization of upper vagina  1998   NM MYOCAR PERF WALL MOTION  01/24/2008   EF 86% neg ischemia   VAGINAL HYSTERECTOMY  1978   FAMILY HISTORY Family History  Problem Relation Age of Onset   Heart disease Mother    Heart failure Mother    Breast cancer Mother 67   Prostate cancer Father    Inflammatory bowel disease Sister    Diabetes Maternal Aunt    Colon cancer Paternal Grandfather    Stomach cancer Neg Hx    Pancreatic cancer Neg Hx    Esophageal cancer Neg Hx    Rectal cancer Neg Hx    SOCIAL HISTORY Social History   Tobacco Use   Smoking status: Former    Packs/day: 1.00    Years: 10.00    Total pack years: 10.00    Types: Cigarettes    Quit date: 12/15/1988    Years since quitting: 33.5   Smokeless tobacco: Never   Tobacco comments:    quit in the 1980's  Vaping Use   Vaping Use: Never used  Substance Use Topics   Alcohol use: Yes    Alcohol/week: 14.0 standard drinks of alcohol    Types: 14 Standard drinks or equivalent per week    Comment: drinks wine every day; 2 drinks a day    Drug use: No       OPHTHALMIC EXAM:  Base Eye Exam     Visual Acuity (Snellen - Linear)       Right Left   Dist cc 20/25 -1 20/30 -2   Dist ph cc NI NI    Correction: Glasses         Tonometry (Tonopen, 1:26 PM)       Right Left   Pressure 20 20         Pupils       Dark Light Shape React APD   Right 5 4 Round Brisk  None   Left 5 4 Round Brisk None         Visual Fields (Counting fingers)       Left Right    Full Full         Extraocular Movement       Right Left    Full, Ortho Full, Ortho  Neuro/Psych     Oriented x3: Yes   Mood/Affect: Normal         Dilation     Both eyes: 1.0% Mydriacyl, 2.5% Phenylephrine @ 1:26 PM           Slit Lamp and Fundus Exam     Slit Lamp Exam       Right Left   Lids/Lashes Dermatochalasis - upper lid, mild MGD Dermatochalasis - upper lid, mild MGD   Conjunctiva/Sclera nasal pingeucula White and quiet   Cornea arcus, trace PEE, well healed cataract wound arcus, trace PEE, well healed cataract wound   Anterior Chamber deep, clear, narrow temporal angle Deep and quiet   Iris Round and dilated Round and dilated   Lens PC IOL in good position, trace Posterior capsular opacification PC IOL in good position with open PC   Anterior Vitreous syneresis, Posterior vitreous detachment, vitreous condensations syneresis, Posterior vitreous detachment         Fundus Exam       Right Left   Disc Pink and Sharp Pink and Sharp, +PPP ST rim   C/D Ratio 0.2 0.2   Macula Blunted foveal reflex, central edema / SRF stably- improved, no heme, +drusen, RPE mottling Blunted foveal reflex, Drusen, RPE mottling and clumping, No heme or edema   Vessels attenuated, Tortuous attenuated, mild tortuosity, mild AV crossing changes   Periphery Attached, mild midzonal drusen, no heme Attached, mild midzonal drusen, no heme           Refraction     Wearing Rx       Sphere Cylinder Axis Add   Right -0.50 +0.50 146 +2.50   Left -1.25 +1.25 167 +2.50           IMAGING AND PROCEDURES  Imaging and Procedures for 06/24/2022  OCT, Retina - OU - Both Eyes       Right Eye Quality was good. Central Foveal Thickness: 219. Progression has been stable. Findings include normal foveal contour, no IRF, no SRF, retinal drusen , intraretinal  hyper-reflective material, pigment epithelial detachment, outer retinal atrophy (Stable improvement in cystic changes, PED height, and SRF; +ORA- stable ).   Left Eye Quality was good. Central Foveal Thickness: 268. Progression has been stable. Findings include normal foveal contour, no IRF, no SRF, retinal drusen , outer retinal atrophy (Patchy ORA).   Notes *Images captured and stored on drive  Diagnosis / Impression:  OD: exudative ARMD - Stable improvement in cystic changes, PED height, and SRF; +ORA- stable  OS: non-exu ARMD, patchy ORA  Clinical management:  See below  Abbreviations: NFP - Normal foveal profile. CME - cystoid macular edema. PED - pigment epithelial detachment. IRF - intraretinal fluid. SRF - subretinal fluid. EZ - ellipsoid zone. ERM - epiretinal membrane. ORA - outer retinal atrophy. ORT - outer retinal tubulation. SRHM - subretinal hyper-reflective material. IRHM - intraretinal hyper-reflective material      Intravitreal Injection, Pharmacologic Agent - OD - Right Eye       Time Out 06/24/2022. 2:24 PM. Confirmed correct patient, procedure, site, and patient consented.   Anesthesia Topical anesthesia was used. Anesthetic medications included Lidocaine 2%, Proparacaine 0.5%.   Procedure Preparation included 5% betadine to ocular surface, eyelid speculum. A (32g) needle was used.   Injection: 1.25 mg Bevacizumab 1.'25mg'$ /0.52m   Route: Intravitreal, Site: Right Eye   NDC: [id number] Lot:: [id number] Expiration date: 08/11/2022   Post-op Post injection exam found visual acuity of at least counting fingers. The  patient tolerated the procedure well. There were no complications. The patient received written and verbal post procedure care education. Post injection medications were not given.            ASSESSMENT/PLAN:    ICD-10-CM   1. Exudative age-related macular degeneration of right eye with active choroidal neovascularization (HCC)  H35.3211 OCT,  Retina - OU - Both Eyes    Intravitreal Injection, Pharmacologic Agent - OD - Right Eye    Bevacizumab (AVASTIN) SOLN 1.25 mg    2. Intermediate stage nonexudative age-related macular degeneration of left eye  H35.3122     3. Essential hypertension  I10     4. Hypertensive retinopathy of both eyes  H35.033     5. 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, right eye    - s/p IVA OD #1 (04.03.23), #2 (05.08.23), #3 (06.05.23)  - at initial visit, pt reported 1-2 mo history of central "spot" in vision OD  - FA (04.03.23) shows +CNV with mild late leakage IT fovea, no obvious smokestack or smoking dot  - BCVA 20/25 from 20/40  - OCT shows: Stable improvement in cystic changes, PED height, and SRF; +ORA- stable at 5 wks  - pt reports high stress and +steroid use -- ?CSR component  - recommend IVA OD #4 today, 07.11.23- with ext interval to 6 weeks.  - pt wishes to be treated with IVA OD  - RBA of procedure discussed, questions answered - IVA OD informed consent obtained and signed on 04.03.23 - see procedure note  - f/u in 6 wks, DFE, OCT, possible injection  2. Age related macular degeneration, non-exudative, left eye  - The incidence, anatomy, and pathology of dry AMD, risk of progression, and the AREDS and AREDS 2 study including smoking risks discussed with patient.  - Recommend amsler grid monitoring  - f/u in 6 weeks DFE, OCT  3,4. Hypertensive retinopathy OU - discussed importance of tight BP control - monitor  5. Pseudophakia OU  - s/p CE/IOL (Dr. Elliot Dally)  - IOL in good position, doing well  - monitor  Ophthalmic Meds Ordered this visit:  Meds ordered this encounter  Medications   Bevacizumab (AVASTIN) SOLN 1.25 mg     Return in about 6 weeks (around 08/05/2022) for DFE, OCT, Possible injection.  There are no Patient Instructions on file for this visit.  Explained the diagnoses, plan, and follow up with the patient and they  expressed understanding.  Patient expressed understanding of the importance of proper follow up care.   This document serves as a record of services personally performed by Gardiner Sleeper, MD, PhD. It was created on their behalf by Renaldo Reel, [employer] an ophthalmic technician. The creation of this record is the provider's dictation and/or activities during the visit.    Electronically signed by:  Renaldo Reel, COT  06/24/22 4:23 PM  Gardiner Sleeper, M.D., Ph.D. Diseases & Surgery of the Retina and Vitreous [facility] [facility]  I have reviewed the above documentation for accuracy and completeness, and I agree with the above. Gardiner Sleeper, M.D., Ph.D. 06/24/22 4:25 PM   Abbreviations: M myopia (nearsighted); A astigmatism; H hyperopia (farsighted); P presbyopia; Mrx spectacle prescription;  CTL contact lenses; OD right eye; OS left eye; OU both eyes  XT exotropia; ET esotropia; PEK punctate epithelial keratitis; PEE punctate epithelial erosions; DES dry eye syndrome; MGD meibomian gland dysfunction; ATs artificial tears;  PFAT's preservative free artificial tears; [place] nuclear sclerotic cataract; PSC posterior subcapsular cataract; ERM epi-retinal membrane; PVD posterior vitreous detachment; RD retinal detachment; DM diabetes mellitus; DR diabetic retinopathy; NPDR non-proliferative diabetic retinopathy; PDR proliferative diabetic retinopathy; CSME clinically significant macular edema; DME diabetic macular edema; dbh dot blot hemorrhages; CWS cotton wool spot; POAG primary open angle glaucoma; C/D cup-to-disc ratio; HVF humphrey visual field; GVF goldmann visual field; OCT optical coherence tomography; IOP intraocular pressure; BRVO Branch retinal vein occlusion; CRVO central retinal vein occlusion; CRAO central retinal artery occlusion; BRAO branch retinal artery occlusion; RT retinal tear; SB scleral buckle; PPV pars plana vitrectomy; VH Vitreous hemorrhage; PRP panretinal  laser photocoagulation; IVK intravitreal kenalog; VMT vitreomacular traction; MH Macular hole;  NVD neovascularization of the disc; NVE neovascularization elsewhere; AREDS age related eye disease study; ARMD age related macular degeneration; POAG primary open angle glaucoma; EBMD epithelial/anterior basement membrane dystrophy; ACIOL anterior chamber intraocular lens; IOL intraocular lens; PCIOL posterior chamber intraocular lens; Phaco/IOL phacoemulsification with intraocular lens placement; [place] photorefractive keratectomy; LASIK laser assisted in situ keratomileusis; HTN hypertension; DM diabetes mellitus; COPD chronic obstructive pulmonary disease

## 2022-06-27 ENCOUNTER — Encounter: Payer: Self-pay | Admitting: Gastroenterology

## 2022-07-09 ENCOUNTER — Ambulatory Visit: Payer: PPO | Admitting: Nurse Practitioner

## 2022-07-09 ENCOUNTER — Encounter: Payer: Self-pay | Admitting: Gastroenterology

## 2022-07-09 ENCOUNTER — Ambulatory Visit (AMBULATORY_SURGERY_CENTER): Payer: PPO | Admitting: Gastroenterology

## 2022-07-09 VITALS — BP 132/61 | HR 74 | Temp 98.6°F | Resp 13 | Ht 64.0 in | Wt 130.0 lb

## 2022-07-09 DIAGNOSIS — K5289 Other specified noninfective gastroenteritis and colitis: Secondary | ICD-10-CM

## 2022-07-09 DIAGNOSIS — R197 Diarrhea, unspecified: Secondary | ICD-10-CM

## 2022-07-09 DIAGNOSIS — K5730 Diverticulosis of large intestine without perforation or abscess without bleeding: Secondary | ICD-10-CM | POA: Diagnosis not present

## 2022-07-09 DIAGNOSIS — K648 Other hemorrhoids: Secondary | ICD-10-CM

## 2022-07-09 DIAGNOSIS — R933 Abnormal findings on diagnostic imaging of other parts of digestive tract: Secondary | ICD-10-CM | POA: Diagnosis not present

## 2022-07-09 DIAGNOSIS — K6389 Other specified diseases of intestine: Secondary | ICD-10-CM | POA: Diagnosis not present

## 2022-07-09 DIAGNOSIS — F419 Anxiety disorder, unspecified: Secondary | ICD-10-CM | POA: Diagnosis not present

## 2022-07-09 MED ORDER — SODIUM CHLORIDE 0.9 % IV SOLN: 500.0000 mL | Freq: Once | INTRAVENOUS | Status: DC

## 2022-07-09 NOTE — Progress Notes (Signed)
To pacu, VSS. Report to [email or web address].tb 

## 2022-07-09 NOTE — Op Note (Signed)
[place] Patient Name: [name] Procedure Date: 07/09/2022 1:58 PM MRN: [id number] Endoscopist: Milus Banister , MD Age: 81 Referring MD:  Date of Birth: 16-Aug-1941 Gender: Female Account #: [id number] Procedure:                Colonoscopy Indications:              Chronic diarrhea, abnormal sigmoid on recent CT, ?                            recent diverticulitis Medicines:                Monitored Anesthesia Care Procedure:                Pre-Anesthesia Assessment:                           - Prior to the procedure, a History and Physical                            was performed, and patient medications and                            allergies were reviewed. The patient's tolerance of                            previous anesthesia was also reviewed. The risks                            and benefits of the procedure and the sedation                            options and risks were discussed with the patient.                            All questions were answered, and informed consent                            was obtained. Prior Anticoagulants: The patient has                            taken no previous anticoagulant or antiplatelet                            agents. ASA Grade Assessment: II - A patient with                            mild systemic disease. After reviewing the risks                            and benefits, the patient was deemed in                            satisfactory condition to undergo the procedure.  After obtaining informed consent, the colonoscope                            was passed under direct vision. Throughout the                            procedure, the patient's blood pressure, pulse, and                            oxygen saturations were monitored continuously. The                            CF [id number] #[id number] was introduced through the anus                            and advanced to the the terminal  ileum. The                            colonoscopy was performed without difficulty. The                            patient tolerated the procedure well. The quality                            of the bowel preparation was good. The terminal                            ileum, ileocecal valve, appendiceal orifice, and                            rectum were photographed. Scope In: 2:00:05 PM Scope Out: 2:11:01 PM Scope Withdrawal Time: 0 hours 7 minutes 26 seconds  Total Procedure Duration: 0 hours 10 minutes 56 seconds  Findings:                 The terminal ileum appeared normal.                           Internal hemorrhoids were found. The hemorrhoids                            were small.                           A few small-mouthed diverticula were found in the                            left colon.                           Biopsies for histology were taken with a cold                            forceps from the entire colon for evaluation of  microscopic colitis.                           The exam was otherwise without abnormality on                            direct and retroflexion views. Complications:            No immediate complications. Estimated blood loss:                            None. Estimated Blood Loss:     Estimated blood loss: none. Impression:               - The examined portion of the ileum was normal.                           - Internal hemorrhoids.                           - Diverticulosis in the left colon.                           - The examination was otherwise normal on direct                            and retroflexion views.                           - Biopsies were taken with a cold forceps from the                            entire colon for evaluation of microscopic colitis. Recommendation:           - Patient has a contact number available for                            emergencies. The signs and symptoms of potential                             delayed complications were discussed with the                            patient. Return to normal activities tomorrow.                            Written discharge instructions were provided to the                            patient.                           - Resume previous diet.                           - Continue present medications.                           -  Await pathology results. Milus Banister, MD 07/09/2022 2:14:24 PM This report has been signed electronically.

## 2022-07-09 NOTE — Progress Notes (Signed)
HPI: This is a woman with diarrhea, abnormal colon on ct   ROS: complete GI ROS as described in HPI, all other review negative.  Constitutional:  No unintentional weight loss   Past Medical History:  Diagnosis Date   Actinic keratosis 12/03/2012   Allergy    Anxiety    Effexor helps--psychiatrist Dr [facility]   Arthritis    osteoarthritis, hands   Atypical chest pain    EF 86%, breast attenuation, no significant ischemia   Cataract    Cervical radiculopathy 11/18/2013   Chronic headache    takes Metoprolol for this   Diverticulosis    Erosive esophagitis    GERD (gastroesophageal reflux disease) 11/2009   EGD: Dr. 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 14 tablet 0   azelastine (ASTELIN) 0.1 % nasal spray USE TWO SPRAYS IN EACH NOSTRIL TWICE DAILY AS DIRECTED (Patient taking differently: Use PRN) 30 mL 5   Bilberry, Vaccinium myrtillus, (BILBERRY PO) Take 1 capsule by mouth daily.     Cholecalciferol (D3-1000 PO) Take by mouth.     conjugated estrogens (PREMARIN) vaginal cream Uses 14 days a month     cycloSPORINE (RESTASIS)  0.05 % ophthalmic emulsion Restasis 0.05 % eye drops in a dropperette     fluticasone (FLONASE) 50 MCG/ACT nasal spray Place 2 sprays into both nostrils daily. 16 g 6   ibuprofen (ADVIL) 800 MG tablet Take 1 tablet (800 mg total) by mouth every 8 (eight) hours as needed. 30 tablet 0   Loperamide HCl (IMODIUM PO) Take by mouth. PRN     methocarbamol (ROBAXIN) 500 MG tablet Take 1 tablet (500 mg total) by mouth every 8 (eight) hours as needed for muscle spasms. Take mostly at bedtime 60 tablet 1   metoprolol succinate (TOPROL-XL) 50 MG 24 hr tablet daily. Takes for headaches     mometasone (ELOCON) 0.1 % cream 1 application     Multiple Vitamins-Minerals (ICAPS AREDS 2 PO) Take by mouth daily.     Multiple Vitamins-Minerals (ICAPS LUTEIN & ZEAXANTHIN PO) Take by mouth.     omeprazole (PRILOSEC) 40 MG capsule Take 1 capsule by mouth daily.     ondansetron (ZOFRAN) 4 MG tablet Takes PRN     rosuvastatin (CRESTOR) 10 MG tablet  (Patient not taking: Reported on 05/19/2022)     terbinafine (LAMISIL) 250 MG tablet Take 1 tablet (250 mg total) by mouth daily. 90 tablet 0   triamcinolone cream (KENALOG) 0.1 %      venlafaxine XR (EFFEXOR-XR) 75  MG 24 hr capsule TAKE ONE CAPSULE BY MOUTH ONCE DAILY WITH  BREAKFAST 90 capsule 3   Current Facility-Administered Medications  Medication Dose Route Frequency Provider Last Rate Last Admin   0.9 %  sodium chloride infusion  500 mL Intravenous Once Milus Banister, MD        Allergies as of 07/09/2022 - Review Complete 06/24/2022  Allergen Reaction Noted   Levaquin [levofloxacin in d5w] Nausea Only 12/01/2013    Family History  Problem Relation Age of Onset   Heart disease Mother    Heart failure Mother    Breast cancer Mother 50   Prostate cancer Father    Inflammatory bowel disease Sister    Diabetes Maternal Aunt    Colon cancer Paternal Grandfather    Stomach cancer Neg Hx    Pancreatic cancer Neg Hx    Esophageal cancer Neg Hx    Rectal cancer  Neg Hx     Social History   Socioeconomic History   Marital status: Married    Spouse name: Not on file   Number of children: 1   Years of education: Not on file   Highest education level: Not on file  Occupational History   Occupation: retired  Tobacco Use   Smoking status: Former    Packs/day: 1.00    Years: 10.00    Total pack years: 10.00    Types: Cigarettes    Quit date: 12/15/1988    Years since quitting: 33.5   Smokeless tobacco: Never   Tobacco comments:    quit in the 1980's  Vaping Use   Vaping Use: Never used  Substance and Sexual Activity   Alcohol use: Yes    Alcohol/week: 14.0 standard drinks of alcohol    Types: 14 Standard drinks or equivalent per week    Comment: drinks wine every day; 2 drinks a day    Drug use: No   Sexual activity: Never    Birth control/protection: Surgical, Post-menopausal    Comment: HYST  Other Topics Concern   Not on file  Social History Narrative   Married, mother of one.  Lives with spouse and 4 dogs.   Very active. Regular exercise  -- yes.  Quit smoking in 1990.     She monitors her diet well.     1-2 glasses of wine almost every night.   Social Determinants of Health   Financial Resource Strain: Not on file  Food Insecurity: Not on file  Transportation Needs: Not on file  Physical Activity: Not on file  Stress: Not on file  Social Connections: Not on file  Intimate Partner Violence: Not on file     Physical Exam: BP (!) 156/66   Pulse 80   Ht '5\' 4"'$  (1.626 m)   Wt 130 lb (59 kg)   SpO2 100%   BMI 22.31 kg/m  Constitutional: generally well-appearing Psychiatric: alert and oriented x3 Lungs: CTA bilaterally Heart: no MCR  Assessment and plan: 81 y.o. female with diarrhea  Colonoscopy today  Care is appropriate for the ambulatory setting.  Owens Loffler, MD [facility] 07/09/2022, 1:29 PM

## 2022-07-09 NOTE — Patient Instructions (Signed)
YOU HAD AN ENDOSCOPIC PROCEDURE TODAY AT THE [facility]:   Refer to the procedure report that was given to you for any specific questions about what was found during the examination.  If the procedure report does not answer your questions, please call your gastroenterologist to clarify.  If you requested that your care partner not be given the details of your procedure findings, then the procedure report has been included in a sealed envelope for you to review at your convenience later.  YOU SHOULD EXPECT: Some feelings of bloating in the abdomen. Passage of more gas than usual.  Walking can help get rid of the air that was put into your GI tract during the procedure and reduce the bloating. If you had a lower endoscopy (such as a colonoscopy or flexible sigmoidoscopy) you may notice spotting of blood in your stool or on the toilet paper. If you underwent a bowel prep for your procedure, you may not have a normal bowel movement for a few days.  Please Note:  You might notice some irritation and congestion in your nose or some drainage.  This is from the oxygen used during your procedure.  There is no need for concern and it should clear up in a day or so.  SYMPTOMS TO REPORT IMMEDIATELY:  Following lower endoscopy (colonoscopy or flexible sigmoidoscopy):  Excessive amounts of blood in the stool  Significant tenderness or worsening of abdominal pains  Swelling of the abdomen that is new, acute  Fever of 100F or higher  Following upper endoscopy (EGD)  Vomiting of blood or coffee ground material  New chest pain or pain under the shoulder blades  Painful or persistently difficult swallowing  New shortness of breath  Fever of 100F or higher  Black, tarry-looking stools  For urgent or emergent issues, a gastroenterologist can be reached at any hour by calling [phone]. Do not use MyChart messaging for urgent concerns.    DIET:  We do recommend a small meal at first, but  then you may proceed to your regular diet.  Drink plenty of fluids but you should avoid alcoholic beverages for 24 hours.  ACTIVITY:  You should plan to take it easy for the rest of today and you should NOT DRIVE or use heavy machinery until tomorrow (because of the sedation medicines used during the test).    FOLLOW UP: Our staff will call the number listed on your records the next business day following your procedure.  We will call around 7:15- 8:00 am to check on you and address any questions or concerns that you may have regarding the information given to you following your procedure. If we do not reach you, we will leave a message.  If you develop any symptoms (ie: fever, flu-like symptoms, shortness of breath, cough etc.) before then, please call [phone].  If you test positive for Covid 19 in the 2 weeks post procedure, please call and report this information to us.    If any biopsies were taken you will be contacted by phone or by letter within the next 1-3 weeks.  Please call us at [phone] if you have not heard about the biopsies in 3 weeks.    SIGNATURES/CONFIDENTIALITY: You and/or your care partner have signed paperwork which will be entered into your electronic medical record.  These signatures attest to the fact that that the information above on your After Visit Summary has been reviewed and is understood.  Full responsibility of the confidentiality   of this discharge information lies with you and/or your care-partner.  

## 2022-07-09 NOTE — Progress Notes (Signed)
Called to room to assist during endoscopic procedure.  Patient ID and intended procedure confirmed with present staff. Received instructions for my participation in the procedure from the performing physician.  

## 2022-07-09 NOTE — Progress Notes (Signed)
Pt's states no medical or surgical changes since previsit or office visit. 

## 2022-07-10 ENCOUNTER — Telehealth: Payer: Self-pay

## 2022-07-10 NOTE — Telephone Encounter (Signed)
Follow up call to pt, lm for pt to call if having any difficulty with normal activities or eating and drinking.  Also to call if any other questions or concerns.  

## 2022-07-21 ENCOUNTER — Encounter: Payer: Self-pay | Admitting: Gastroenterology

## 2022-07-23 NOTE — Progress Notes (Signed)
[facility] Note  08/05/2022    CHIEF COMPLAINT Patient presents for Retina Follow Up   HISTORY OF PRESENT ILLNESS: [name] is a 81 y.o. female who presents to the clinic today for:   HPI     Retina Follow Up   Patient presents with  Wet AMD.  In right eye.  Severity is moderate.  Duration of 6 weeks.  Since onset it is stable.  I, the attending physician,  performed the HPI with the patient and updated documentation appropriately.        Comments   Pt here for 6 wk ret f/u exu ARMD OD. Pt states VA is the same, no changes. She did have floaters w/ lights post last injection that have resolved.       Last edited by Bernarda Caffey, MD on 08/06/2022  2:04 AM.     Patient states vision is not very good  Referring physician: Kristen Loader, [place],  [place] [address]  HISTORICAL INFORMATION:   Selected notes from the MEDICAL RECORD NUMBER Referred by Dr. Lucianne Lei for ex ARMD LEE:  Ocular Hx- PMH-    CURRENT MEDICATIONS: Current Outpatient Medications (Ophthalmic Drugs)  Medication Sig   cycloSPORINE (RESTASIS) 0.05 % ophthalmic emulsion Restasis 0.05 % eye drops in a dropperette   No current facility-administered medications for this visit. (Ophthalmic Drugs)   Current Outpatient Medications (Other)  Medication Sig   amoxicillin-clavulanate (AUGMENTIN) 875-125 MG tablet Take 1 tablet by mouth every 12 (twelve) hours.   azelastine (ASTELIN) 0.1 % nasal spray USE TWO SPRAYS IN EACH NOSTRIL TWICE DAILY AS DIRECTED (Patient taking differently: Use PRN)   Bilberry, Vaccinium myrtillus, (BILBERRY PO) Take 1 capsule by mouth daily.   Cholecalciferol (D3-1000 PO) Take by mouth.   conjugated estrogens (PREMARIN) vaginal cream Uses 14 days a month   fluticasone (FLONASE) 50 MCG/ACT nasal spray Place 2 sprays into both nostrils daily.   ibuprofen (ADVIL) 800 MG tablet Take 1 tablet (800 mg total) by mouth every 8 (eight)  hours as needed.   Loperamide HCl (IMODIUM PO) Take by mouth. PRN   methocarbamol (ROBAXIN) 500 MG tablet Take 1 tablet (500 mg total) by mouth every 8 (eight) hours as needed for muscle spasms. Take mostly at bedtime   metoprolol succinate (TOPROL-XL) 50 MG 24 hr tablet daily. Takes for headaches   mometasone (ELOCON) 0.1 % cream 1 application   Multiple Vitamins-Minerals (ICAPS AREDS 2 PO) Take by mouth daily.   Multiple Vitamins-Minerals (ICAPS LUTEIN & ZEAXANTHIN PO) Take by mouth.   omeprazole (PRILOSEC) 40 MG capsule Take 1 capsule by mouth daily.   ondansetron (ZOFRAN) 4 MG tablet Takes PRN   rosuvastatin (CRESTOR) 10 MG tablet    terbinafine (LAMISIL) 250 MG tablet Take 1 tablet (250 mg total) by mouth daily.   triamcinolone cream (KENALOG) 0.1 %    venlafaxine XR (EFFEXOR-XR) 75 MG 24 hr capsule TAKE ONE CAPSULE BY MOUTH ONCE DAILY WITH  BREAKFAST   No current facility-administered medications for this visit. (Other)   REVIEW OF SYSTEMS: ROS   Positive for: Eyes, Psychiatric Negative for: Constitutional, Gastrointestinal, Neurological, Skin, Genitourinary, Musculoskeletal, HENT, Endocrine, Cardiovascular, Respiratory, Allergic/Imm, Heme/Lymph Last edited by Kingsley Spittle, COT on 08/05/2022  1:40 PM.      ALLERGIES Allergies  Allergen Reactions   Levaquin [Levofloxacin In D5w] Nausea Only    Dizziness and tremulousness   PAST MEDICAL HISTORY Past Medical History:  Diagnosis Date   Actinic keratosis 12/03/2012   Allergy    Anxiety    Effexor helps--psychiatrist Dr Toy Care   Arthritis    osteoarthritis, hands   Atypical chest pain    EF 86%, breast attenuation, no significant ischemia   Cataract    Cervical radiculopathy 11/18/2013   Chronic headache    takes Metoprolol for this   Diverticulosis    Erosive esophagitis    GERD (gastroesophageal reflux disease) 11/2009   EGD: Dr. 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   Past Surgical History:  Procedure Laterality Date   AUGMENTATION MAMMAPLASTY Bilateral    silicone gel implants   Birth mark removed     CATARACT EXTRACTION Bilateral    COLONOSCOPY     COLPOSCOPY     laser vaporization of upper vagina  1998   NM MYOCAR PERF WALL MOTION  01/24/2008   EF 86% neg ischemia   VAGINAL HYSTERECTOMY  1978   FAMILY HISTORY Family History  Problem Relation Age of Onset   Heart disease Mother    Heart failure Mother    Breast cancer Mother 23   Prostate cancer Father    Inflammatory bowel disease Sister    Diabetes Maternal Aunt    Colon cancer Paternal Grandfather    Stomach cancer Neg Hx    Pancreatic cancer Neg Hx    Esophageal cancer Neg Hx    Rectal cancer Neg Hx    SOCIAL HISTORY Social History   Tobacco Use   Smoking status: Former    Packs/day: 1.00    Years: 10.00    Total pack years: 10.00    Types: Cigarettes    Quit date: 12/15/1988    Years since quitting: 33.6   Smokeless tobacco: Never   Tobacco comments:    quit in the 1980's  Vaping Use   Vaping Use: Never used  Substance Use Topics   Alcohol use: Yes    Alcohol/week: 14.0 standard drinks of alcohol    Types: 14 Standard drinks or equivalent per week    Comment: drinks wine every day; 2 drinks a day    Drug use: No       OPHTHALMIC EXAM:  Base Eye Exam     Visual Acuity (Snellen - Linear)       Right Left   Dist cc 20/30 -1 20/30 +1   Dist ph cc NI NI         Tonometry (Tonopen, 1:46 PM)       Right Left   Pressure 12 13         Pupils       Dark Light Shape React APD   Right 5 4 Round Brisk None   Left 5 4 Round Brisk None          Visual Fields (Counting fingers)       Left Right    Full Full         Extraocular Movement       Right Left    Full, Ortho Full, Ortho  Neuro/Psych     Oriented x3: Yes   Mood/Affect: Normal         Dilation     Both eyes: 1.0% Mydriacyl, 2.5% Phenylephrine @ 1:47 PM           Slit Lamp and Fundus Exam     Slit Lamp Exam       Right Left   Lids/Lashes Dermatochalasis - upper lid, mild MGD Dermatochalasis - upper lid, mild MGD   Conjunctiva/Sclera nasal pingeucula White and quiet   Cornea arcus, trace PEE, well healed cataract wound arcus, trace PEE, well healed cataract wound   Anterior Chamber deep, clear, narrow temporal angle Deep and quiet   Iris Round and dilated Round and dilated   Lens PC IOL in good position, trace Posterior capsular opacification PC IOL in good position with open PC   Anterior Vitreous syneresis, Posterior vitreous detachment, vitreous condensations syneresis, Posterior vitreous detachment         Fundus Exam       Right Left   Disc Pink and Sharp Pink and Sharp, +PPP ST rim   C/D Ratio 0.2 0.2   Macula Blunted foveal reflex, central edema / SRF -- slightly increased, no heme, +drusen, RPE mottling Blunted foveal reflex, Drusen, RPE mottling and clumping, No heme or edema   Vessels mild attenuation, mild tortuosity mild attenuation, mild tortuosity   Periphery Attached, mild midzonal drusen, no heme Attached, mild midzonal drusen, no heme           IMAGING AND PROCEDURES  Imaging and Procedures for 08/05/2022  OCT, Retina - OU - Both Eyes       Right Eye Quality was good. Central Foveal Thickness: 251. Progression has worsened. Findings include no IRF, no SRF, abnormal foveal contour, retinal drusen , intraretinal hyper-reflective material, pigment epithelial detachment, outer retinal atrophy (Mild interval increase in PED height and overlying SRF; +ORA).   Left Eye Quality was good. Central Foveal Thickness:  278. Progression has been stable. Findings include normal foveal contour, no IRF, no SRF, retinal drusen , outer retinal atrophy (Patchy ORA).   Notes *Images captured and stored on drive  Diagnosis / Impression:  OD: exudative ARMD - Mild interval increase in PED height and overlying SRF; +ORA OS: non-exu ARMD, patchy ORA  Clinical management:  See below  Abbreviations: NFP - Normal foveal profile. CME - cystoid macular edema. PED - pigment epithelial detachment. IRF - intraretinal fluid. SRF - subretinal fluid. EZ - ellipsoid zone. ERM - epiretinal membrane. ORA - outer retinal atrophy. ORT - outer retinal tubulation. SRHM - subretinal hyper-reflective material. IRHM - intraretinal hyper-reflective material      Intravitreal Injection, Pharmacologic Agent - OD - Right Eye       Time Out 08/05/2022. 2:30 PM. Confirmed correct patient, procedure, site, and patient consented.   Anesthesia Topical anesthesia was used. Anesthetic medications included Lidocaine 2%, Proparacaine 0.5%.   Procedure Preparation included 5% betadine to ocular surface, eyelid speculum. A (32g) needle was used.   Injection: 1.25 mg Bevacizumab 1.45m/0.05ml   Route: Intravitreal, Site: Right Eye   NDC: 50242-060-01, Lot:: [id number] Expiration date: 10/03/2022   Post-op Post injection exam found visual acuity of at least counting fingers. The patient tolerated the procedure well. There were no complications. The patient received written and verbal post procedure care education. Post injection medications were not given.            ASSESSMENT/PLAN:    ICD-10-CM   1. Exudative age-related  macular degeneration of right eye with active choroidal neovascularization (HCC)  H35.3211 OCT, Retina - OU - Both Eyes    Intravitreal Injection, Pharmacologic Agent - OD - Right Eye    Bevacizumab (AVASTIN) SOLN 1.25 mg    2. Intermediate stage nonexudative age-related macular degeneration of left eye  H35.3122      3. Essential hypertension  I10     4. Hypertensive retinopathy of both eyes  H35.033     5. Pseudophakia, both eyes  Z96.1       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, right eye    - s/p IVA OD #1 (04.03.23), #2 (05.08.23), #3 (06.05.23), #4 (07.11.23)  - at initial visit, pt reported 1-2 mo history of central "spot" in vision OD  - FA (04.03.23) shows +CNV with mild late leakage IT fovea, no obvious smokestack or smoking dot  - BCVA 20/30 from 20/25  - OCT shows: Mild interval increase in PED height and overlying SRF; +ORA at 6 weeks  **interval increase in SRF noted on OCT at 6 weeks on 08.22.23**  - discussed possible Avastin resistance and switch to Eylea if SRF and vision fail to improve  - pt reports high stress and +steroid use -- ?CSR component  - recommend IVA OD #5 today, 08.22.23- with interval back to 5 weeks.  - pt wishes to be treated with IVA OD  - RBA of procedure discussed, questions answered - IVA OD informed consent obtained and signed on 04.03.23 - see procedure note  - f/u in 5 wks, DFE, OCT, possible injection  2. Age related macular degeneration, non-exudative, left eye  - The incidence, anatomy, and pathology of dry AMD, risk of progression, and the AREDS and AREDS 2 study including smoking risks discussed with patient.  - Recommend amsler grid monitoring  - f/u in 6 weeks DFE, OCT  3,4. Hypertensive retinopathy OU - discussed importance of tight BP control - monitor  5. Pseudophakia OU  - s/p CE/IOL (Dr. Elliot Dally)  - IOL in good position, doing well  - monitor  Ophthalmic Meds Ordered this visit:  Meds ordered this encounter  Medications   Bevacizumab (AVASTIN) SOLN 1.25 mg     Return in about 5 weeks (around 09/09/2022) for f/u exu ARMD OD, DFE, OCT.  There are no Patient Instructions on file for this visit.  Explained the diagnoses, plan, and follow up with the patient and they expressed understanding.  Patient expressed understanding  of the importance of proper follow up care.   This document serves as a record of services personally performed by Gardiner Sleeper, MD, PhD. It was created on their behalf by Renaldo Reel, [place] an ophthalmic technician. The creation of this record is the provider's dictation and/or activities during the visit.    Electronically signed by:  Renaldo Reel, COT  07/23/22 2:04 AM  This document serves as a record of services personally performed by Gardiner Sleeper, MD, PhD. It was created on their behalf by San Jetty. Owens Shark, OA an ophthalmic technician. The creation of this record is the provider's dictation and/or activities during the visit.    Electronically signed by: San Jetty. Owens Shark, [place] 08.22.2023 2:04 AM  Gardiner Sleeper, M.D., Ph.D. Diseases & Surgery of the Retina and Vitreous [facility]  I have reviewed the above documentation for accuracy and completeness, and I agree with the above. Gardiner Sleeper, M.D., Ph.D. 08/06/22 2:06 AM  Abbreviations: M myopia (nearsighted); A astigmatism;  H hyperopia (farsighted); P presbyopia; Mrx spectacle prescription;  CTL contact lenses; OD right eye; OS left eye; OU both eyes  XT exotropia; ET esotropia; PEK punctate epithelial keratitis; PEE punctate epithelial erosions; DES dry eye syndrome; MGD meibomian gland dysfunction; ATs artificial tears; PFAT's preservative free artificial tears; [place] nuclear sclerotic cataract; PSC posterior subcapsular cataract; ERM epi-retinal membrane; PVD posterior vitreous detachment; RD retinal detachment; DM diabetes mellitus; DR diabetic retinopathy; NPDR non-proliferative diabetic retinopathy; PDR proliferative diabetic retinopathy; CSME clinically significant macular edema; DME diabetic macular edema; dbh dot blot hemorrhages; CWS cotton wool spot; POAG primary open angle glaucoma; C/D cup-to-disc ratio; HVF humphrey visual field; GVF goldmann visual field; OCT optical coherence tomography;  IOP intraocular pressure; BRVO Branch retinal vein occlusion; CRVO central retinal vein occlusion; CRAO central retinal artery occlusion; BRAO branch retinal artery occlusion; RT retinal tear; SB scleral buckle; PPV pars plana vitrectomy; VH Vitreous hemorrhage; PRP panretinal laser photocoagulation; IVK intravitreal kenalog; VMT vitreomacular traction; MH Macular hole;  NVD neovascularization of the disc; NVE neovascularization elsewhere; AREDS age related eye disease study; ARMD age related macular degeneration; POAG primary open angle glaucoma; EBMD epithelial/anterior basement membrane dystrophy; ACIOL anterior chamber intraocular lens; IOL intraocular lens; PCIOL posterior chamber intraocular lens; Phaco/IOL phacoemulsification with intraocular lens placement; [place] photorefractive keratectomy; LASIK laser assisted in situ keratomileusis; HTN hypertension; DM diabetes mellitus; COPD chronic obstructive pulmonary disease

## 2022-08-05 ENCOUNTER — Ambulatory Visit (INDEPENDENT_AMBULATORY_CARE_PROVIDER_SITE_OTHER): Payer: PPO | Admitting: Ophthalmology

## 2022-08-05 ENCOUNTER — Encounter (INDEPENDENT_AMBULATORY_CARE_PROVIDER_SITE_OTHER): Payer: Self-pay | Admitting: Ophthalmology

## 2022-08-05 DIAGNOSIS — H353211 Exudative age-related macular degeneration, right eye, with active choroidal neovascularization: Secondary | ICD-10-CM | POA: Diagnosis not present

## 2022-08-05 DIAGNOSIS — I10 Essential (primary) hypertension: Secondary | ICD-10-CM | POA: Diagnosis not present

## 2022-08-05 DIAGNOSIS — H353122 Nonexudative age-related macular degeneration, left eye, intermediate dry stage: Secondary | ICD-10-CM | POA: Diagnosis not present

## 2022-08-05 DIAGNOSIS — Z961 Presence of intraocular lens: Secondary | ICD-10-CM | POA: Diagnosis not present

## 2022-08-05 DIAGNOSIS — H35033 Hypertensive retinopathy, bilateral: Secondary | ICD-10-CM | POA: Diagnosis not present

## 2022-08-05 MED ORDER — BEVACIZUMAB CHEMO INJECTION 1.25MG/0.05ML SYRINGE FOR KALEIDOSCOPE
1.2500 mg | INTRAVITREAL | Status: AC | PRN
  Administered 2022-08-05: 1.25 mg via INTRAVITREAL

## 2022-08-15 ENCOUNTER — Ambulatory Visit: Payer: PPO | Admitting: Podiatry

## 2022-08-19 ENCOUNTER — Ambulatory Visit: Payer: PPO | Admitting: Podiatry

## 2022-08-19 DIAGNOSIS — S90221A Contusion of right lesser toe(s) with damage to nail, initial encounter: Secondary | ICD-10-CM

## 2022-08-19 NOTE — Progress Notes (Signed)
Chief Complaint  Patient presents with   Nail Problem    Right foot third toe nail turning black for 3 weeks.    HPI: 81 y.o. female presenting today as an established patient for a new complaint of discoloration to the right third toe.  Patient states that she has noticed discoloration of the right third toe over the last month.  She denies a history of injury.  There is no pain associated to the area.  She presents for further treatment and evaluation  Past Medical History:  Diagnosis Date   Actinic keratosis 12/03/2012   Allergy    Anxiety    Effexor helps--psychiatrist Dr Toy Care   Arthritis    osteoarthritis, hands   Atypical chest pain    EF 86%, breast attenuation, no significant ischemia   Cataract    Cervical radiculopathy 11/18/2013   Chronic headache    takes Metoprolol for this   Diverticulosis    Erosive esophagitis    GERD (gastroesophageal reflux disease) 11/2009   EGD: Dr. 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  Dermatology: Skin is warm, dry and supple bilateral lower  extremities. Negative for open lesions or macerations.  There is some slight discoloration of the third digit nail plate right foot secondary to subungual hematoma and subungual bleeding.  This appears stable with routine healing  Vascular: Palpable pedal pulses bilaterally. Capillary refill within normal limits.  Negative for any significant edema or erythema.  Clinically there is no indication of infection  Neurological: Light touch and protective threshold grossly intact  Musculoskeletal Exam: No pedal deformities noted   Assessment: 1. Subungual hematoma RT 3rd digit; stable w/ routine healing  Plan of Care:  1. Patient evaluated.  2. Explained to the patient that the discoloration of the toenail should grow out with time.  It appears stable with good routine healing.   3.  Return to clinic as needed     Edrick Kins, DPM [facility]  Dr. Edrick Kins, DPM    [address], [place] [address]                Office [phone]  Fax [phone]

## 2022-09-02 DIAGNOSIS — Z0000 Encounter for general adult medical examination without abnormal findings: Secondary | ICD-10-CM | POA: Diagnosis not present

## 2022-09-02 DIAGNOSIS — G8929 Other chronic pain: Secondary | ICD-10-CM | POA: Diagnosis not present

## 2022-09-02 DIAGNOSIS — E559 Vitamin D deficiency, unspecified: Secondary | ICD-10-CM | POA: Diagnosis not present

## 2022-09-02 DIAGNOSIS — R5382 Chronic fatigue, unspecified: Secondary | ICD-10-CM | POA: Diagnosis not present

## 2022-09-02 DIAGNOSIS — E782 Mixed hyperlipidemia: Secondary | ICD-10-CM | POA: Diagnosis not present

## 2022-09-02 DIAGNOSIS — F3341 Major depressive disorder, recurrent, in partial remission: Secondary | ICD-10-CM | POA: Diagnosis not present

## 2022-09-02 DIAGNOSIS — Z6821 Body mass index (BMI) 21.0-21.9, adult: Secondary | ICD-10-CM | POA: Diagnosis not present

## 2022-09-02 DIAGNOSIS — I10 Essential (primary) hypertension: Secondary | ICD-10-CM | POA: Diagnosis not present

## 2022-09-02 DIAGNOSIS — F411 Generalized anxiety disorder: Secondary | ICD-10-CM | POA: Diagnosis not present

## 2022-09-04 NOTE — Progress Notes (Signed)
[facility] Note  09/12/2022    CHIEF COMPLAINT Patient presents for Retina Follow Up   HISTORY OF PRESENT ILLNESS: [name] is a 81 y.o. female who presents to the clinic today for:   HPI     Retina Follow Up   Patient presents with  Wet AMD.  In right eye.  Severity is moderate.  Duration of 5 weeks.  I, the attending physician,  performed the HPI with the patient and updated documentation appropriately.        Comments   Pt here for 5 wk ret f/u exu ARMD OD. Pt state VA is the same, no changes.       Last edited by Bernarda Caffey, MD on 09/12/2022  1:27 PM.    Patient doesn't feel like her vision is very good  Referring physician: Kristen Loader, [place],  [place] [id number]  HISTORICAL INFORMATION:   Selected notes from the MEDICAL RECORD NUMBER Referred by Dr. Lucianne Lei for ex ARMD LEE:  Ocular Hx- PMH-    CURRENT MEDICATIONS: Current Outpatient Medications (Ophthalmic Drugs)  Medication Sig   cycloSPORINE (RESTASIS) 0.05 % ophthalmic emulsion Restasis 0.05 % eye drops in a dropperette   No current facility-administered medications for this visit. (Ophthalmic Drugs)   Current Outpatient Medications (Other)  Medication Sig   azelastine (ASTELIN) 0.1 % nasal spray USE TWO SPRAYS IN EACH NOSTRIL TWICE DAILY AS DIRECTED (Patient taking differently: Use PRN)   Bilberry, Vaccinium myrtillus, (BILBERRY PO) Take 1 capsule by mouth daily.   Cholecalciferol (D3-1000 PO) Take by mouth.   conjugated estrogens (PREMARIN) vaginal cream Uses 14 days a month   fluticasone (FLONASE) 50 MCG/ACT nasal spray Place 2 sprays into both nostrils daily.   ibuprofen (ADVIL) 800 MG tablet Take 1 tablet (800 mg total) by mouth every 8 (eight) hours as needed.   Loperamide HCl (IMODIUM PO) Take by mouth. PRN   methocarbamol (ROBAXIN) 500 MG tablet Take 1 tablet (500 mg total) by mouth every 8 (eight) hours as needed for muscle spasms.  Take mostly at bedtime   metoprolol succinate (TOPROL-XL) 50 MG 24 hr tablet daily. Takes for headaches   mometasone (ELOCON) 0.1 % cream 1 application   Multiple Vitamins-Minerals (ICAPS AREDS 2 PO) Take by mouth daily.   Multiple Vitamins-Minerals (ICAPS LUTEIN & ZEAXANTHIN PO) Take by mouth.   omeprazole (PRILOSEC) 40 MG capsule Take 1 capsule by mouth daily.   ondansetron (ZOFRAN) 4 MG tablet Takes PRN   rosuvastatin (CRESTOR) 10 MG tablet    terbinafine (LAMISIL) 250 MG tablet Take 1 tablet (250 mg total) by mouth daily.   triamcinolone cream (KENALOG) 0.1 %    venlafaxine XR (EFFEXOR-XR) 75 MG 24 hr capsule TAKE ONE CAPSULE BY MOUTH ONCE DAILY WITH  BREAKFAST   amoxicillin-clavulanate (AUGMENTIN) 875-125 MG tablet Take 1 tablet by mouth every 12 (twelve) hours. (Patient not taking: Reported on 08/19/2022)   No current facility-administered medications for this visit. (Other)   REVIEW OF SYSTEMS: ROS   Positive for: Eyes, Psychiatric Negative for: Constitutional, Gastrointestinal, Neurological, Skin, Genitourinary, Musculoskeletal, HENT, Endocrine, Cardiovascular, Respiratory, Allergic/Imm, Heme/Lymph Last edited by Kingsley Spittle, COT on 09/12/2022  1:05 PM.     ALLERGIES Allergies  Allergen Reactions   Levaquin [Levofloxacin In D5w] Nausea Only    Dizziness and tremulousness   PAST MEDICAL HISTORY Past Medical History:  Diagnosis Date   Actinic keratosis 12/03/2012   Allergy  Anxiety    Effexor helps--psychiatrist Dr Toy [facility]   Arthritis    osteoarthritis, hands   Atypical chest pain    EF 86%, breast attenuation, no significant ischemia   Cataract    Cervical radiculopathy 11/18/2013   Chronic headache    takes Metoprolol for this   Diverticulosis    Erosive esophagitis    GERD (gastroesophageal reflux disease) 11/2009   EGD: Dr. 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   Past Surgical History:  Procedure Laterality Date   AUGMENTATION MAMMAPLASTY Bilateral    silicone gel implants   Birth mark removed     CATARACT EXTRACTION Bilateral    COLONOSCOPY     COLPOSCOPY     laser vaporization of upper vagina  1998   NM MYOCAR PERF WALL MOTION  01/24/2008   EF 86% neg ischemia   VAGINAL HYSTERECTOMY  1978   FAMILY HISTORY Family History  Problem Relation Age of Onset   Heart disease Mother    Heart failure Mother    Breast cancer Mother 76   Prostate cancer Father    Inflammatory bowel disease Sister    Diabetes Maternal Aunt    Colon cancer Paternal Grandfather    Stomach cancer Neg Hx    Pancreatic cancer Neg Hx    Esophageal cancer Neg Hx    Rectal cancer Neg Hx    SOCIAL HISTORY Social History   Tobacco Use   Smoking status: Former    Packs/day: 1.00    Years: 10.00    Total pack years: 10.00    Types: Cigarettes    Quit date: 12/15/1988    Years since quitting: 33.7   Smokeless tobacco: Never   Tobacco comments:    quit in the 1980's  Vaping Use   Vaping Use: Never used  Substance Use Topics   Alcohol use: Yes    Alcohol/week: 14.0 standard drinks of alcohol    Types: 14 Standard drinks or equivalent per week    Comment: drinks wine every day; 2 drinks a day    Drug use: No       OPHTHALMIC EXAM:  Base Eye Exam     Visual Acuity (Snellen - Linear)       Right Left   Dist cc 20/30 -1 20/30 +2   Dist ph cc NI NI  Pt noticed more waviness in letters OS this time. MS        Tonometry (Tonopen, 1:11 PM)       Right Left   Pressure 14 15         Pupils       Dark Light Shape React APD   Right 5 4 Round Brisk None   Left 5 4 Round Brisk None          Visual Fields (Counting fingers)       Left Right    Full Full         Extraocular Movement       Right Left    Full, Ortho Full, Ortho         Neuro/Psych  Oriented x3: Yes   Mood/Affect: Normal         Dilation     Both eyes: 1.0% Mydriacyl, 2.5% Phenylephrine @ 1:12 PM           Slit Lamp and Fundus Exam     Slit Lamp Exam       Right Left   Lids/Lashes Dermatochalasis - upper lid, mild MGD Dermatochalasis - upper lid, mild MGD   Conjunctiva/Sclera nasal pingeucula White and quiet   Cornea arcus, trace PEE, well healed cataract wound arcus, trace PEE, well healed cataract wound   Anterior Chamber deep, clear, narrow temporal angle Deep and quiet   Iris Round and dilated Round and dilated   Lens PC IOL in good position, trace Posterior capsular opacification PC IOL in good position with open PC   Anterior Vitreous syneresis, Posterior vitreous detachment, vitreous condensations syneresis, Posterior vitreous detachment         Fundus Exam       Right Left   Disc Pink and Sharp Pink and Sharp, +PPP ST rim   C/D Ratio 0.2 0.2   Macula Blunted foveal reflex, central edema / shallow SRF -- persistent, no heme, +drusen, RPE mottling Blunted foveal reflex, Drusen, RPE mottling and clumping, No heme or edema   Vessels attenuated, Tortuous mild attenuation, mild tortuosity   Periphery Attached, mild midzonal drusen, no heme Attached, mild midzonal drusen, no heme           Refraction     Wearing Rx       Sphere Cylinder Axis Add   Right -0.50 +0.50 146 +2.50   Left -1.25 +1.25 167 +2.50           IMAGING AND PROCEDURES  Imaging and Procedures for 09/12/2022  OCT, Retina - OU - Both Eyes       Right Eye Quality was good. Central Foveal Thickness: 242. Progression has been stable. Findings include no IRF, no SRF, abnormal foveal contour, retinal drusen , intraretinal hyper-reflective material, pigment epithelial detachment, outer retinal  atrophy (Persistent shallow SRF overlying PED temporal fovea; +ORA).   Left Eye Quality was good. Central Foveal Thickness: 278. Progression has been stable. Findings include normal foveal contour, no IRF, no SRF, retinal drusen , outer retinal atrophy (Patchy ORA).   Notes *Images captured and stored on drive  Diagnosis / Impression:  OD: exudative ARMD - Persistent shallow SRF overlying PED temporal fovea; +ORA OS: non-exu ARMD, patchy ORA  Clinical management:  See below  Abbreviations: NFP - Normal foveal profile. CME - cystoid macular edema. PED - pigment epithelial detachment. IRF - intraretinal fluid. SRF - subretinal fluid. EZ - ellipsoid zone. ERM - epiretinal membrane. ORA - outer retinal atrophy. ORT - outer retinal tubulation. SRHM - subretinal hyper-reflective material. IRHM - intraretinal hyper-reflective material      Intravitreal Injection, Pharmacologic Agent - OD - Right Eye       Time Out 09/12/2022. 1:20 PM. Confirmed correct patient, procedure, site, and patient consented.   Anesthesia Topical anesthesia was used. Anesthetic medications included Lidocaine 2%, Proparacaine 0.5%.   Procedure Preparation included 5% betadine to ocular surface, eyelid speculum. A (32g) needle was used.   Injection: 2 mg aflibercept 2 MG/0.05ML   Route: Intravitreal, Site: Right Eye   NDC: [id number], Lot: [id number], Expiration date: 09/14/2023, Waste: 0 mL   Post-op Post injection exam found visual acuity of at least counting fingers. The patient tolerated the procedure well. There were no complications. The patient  received written and verbal post procedure care education. Post injection medications were not given.            ASSESSMENT/PLAN:    ICD-10-CM   1. Exudative age-related macular degeneration of right eye with active choroidal neovascularization (HCC)  H35.3211 OCT, Retina - OU - Both Eyes    Intravitreal Injection, Pharmacologic Agent - OD - Right Eye     aflibercept (EYLEA) SOLN 2 mg    2. Intermediate stage nonexudative age-related macular degeneration of left eye  H35.3122     3. Essential hypertension  I10     4. Hypertensive retinopathy of both eyes  H35.033     5. 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, right eye    - s/p IVA OD #1 (04.03.23), #2 (05.08.23), #3 (06.05.23), #4 (07.11.23), #5 (08.22.23)  - at initial visit, pt reported 1-2 mo history of central "spot" in vision OD  - FA (04.03.23) shows +CNV with mild late leakage IT fovea, no obvious smokestack or smoking dot  - BCVA stable at 20/30   - OCT shows: Persistent shallow SRF overlying PED temporal fovea; +ORA at 5 weeks  **interval increase in SRF noted on OCT at 6 weeks on 08.22.23**  - discussed possible Avastin resistance and switch to [facility]  - pt reports high stress and +steroid use -- ?CSR component  - recommend IVE OD #1 today, 09.23.23- with follow up in 5 weeks.  - pt wishes to be treated with IVE OD  - RBA of procedure discussed, questions answered - IVE OD informed consent obtained and signed on 09.29.23 - IVA OD informed consent obtained and signed on 04.03.23 - see procedure note  - f/u in 5 wks, DFE, OCT, possible injection  2. Age related macular degeneration, non-exudative, left eye  - The incidence, anatomy, and pathology of dry AMD, risk of progression, and the AREDS and AREDS 2 study including smoking risks discussed with patient.  - Recommend amsler grid monitoring  - f/u in 6 weeks DFE, OCT  3,4. Hypertensive retinopathy OU - discussed importance of tight BP control - continue to monitor  5. Pseudophakia OU  - s/p CE/IOL (Dr. Elliot Dally)  - IOL in good position, doing well  - continue to monitor  Ophthalmic Meds Ordered this visit:  Meds ordered this encounter  Medications   aflibercept (EYLEA) SOLN 2 mg     Return in about 5 weeks (around 10/17/2022) for f/u exu ARMD OD, DFE, OCT.  There are no  Patient Instructions on file for this visit.  Explained the diagnoses, plan, and follow up with the patient and they expressed understanding.  Patient expressed understanding of the importance of proper follow up care.   This document serves as a record of services personally performed by Gardiner Sleeper, MD, PhD. It was created on their behalf by Renaldo Reel, [place] an ophthalmic technician. The creation of this record is the provider's dictation and/or activities during the visit.    Electronically signed by:  Renaldo Reel, COT  09/04/22 1:59 PM  This document serves as a record of services personally performed by Gardiner Sleeper, MD, PhD. It was created on their behalf by San Jetty. Owens Shark, OA an ophthalmic technician. The creation of this record is the provider's dictation and/or activities during the visit.    Electronically signed by: San Jetty. [place], [place] 09.29.2023 1:59 PM.  Gardiner Sleeper, M.D., Ph.D. Diseases & Surgery of  the Retina and Vitreous [facility]  I have reviewed the above documentation for accuracy and completeness, and I agree with the above. Gardiner Sleeper, M.D., Ph.D. 09/12/22 1:59 PM  Abbreviations: M myopia (nearsighted); A astigmatism; H hyperopia (farsighted); P presbyopia; Mrx spectacle prescription;  CTL contact lenses; OD right eye; OS left eye; OU both eyes  XT exotropia; ET esotropia; PEK punctate epithelial keratitis; PEE punctate epithelial erosions; DES dry eye syndrome; MGD meibomian gland dysfunction; ATs artificial tears; PFAT's preservative free artificial tears; [place] nuclear sclerotic cataract; PSC posterior subcapsular cataract; ERM epi-retinal membrane; PVD posterior vitreous detachment; RD retinal detachment; DM diabetes mellitus; DR diabetic retinopathy; NPDR non-proliferative diabetic retinopathy; PDR proliferative diabetic retinopathy; CSME clinically significant macular edema; DME diabetic macular edema; dbh dot blot  hemorrhages; CWS cotton wool spot; POAG primary open angle glaucoma; C/D cup-to-disc ratio; HVF humphrey visual field; GVF goldmann visual field; OCT optical coherence tomography; IOP intraocular pressure; BRVO Branch retinal vein occlusion; CRVO central retinal vein occlusion; CRAO central retinal artery occlusion; BRAO branch retinal artery occlusion; RT retinal tear; SB scleral buckle; PPV pars plana vitrectomy; VH Vitreous hemorrhage; PRP panretinal laser photocoagulation; IVK intravitreal kenalog; VMT vitreomacular traction; MH Macular hole;  NVD neovascularization of the disc; NVE neovascularization elsewhere; AREDS age related eye disease study; ARMD age related macular degeneration; POAG primary open angle glaucoma; EBMD epithelial/anterior basement membrane dystrophy; ACIOL anterior chamber intraocular lens; IOL intraocular lens; PCIOL posterior chamber intraocular lens; Phaco/IOL phacoemulsification with intraocular lens placement; [place] photorefractive keratectomy; LASIK laser assisted in situ keratomileusis; HTN hypertension; DM diabetes mellitus; COPD chronic obstructive pulmonary disease

## 2022-09-12 ENCOUNTER — Encounter (INDEPENDENT_AMBULATORY_CARE_PROVIDER_SITE_OTHER): Payer: Self-pay | Admitting: Ophthalmology

## 2022-09-12 ENCOUNTER — Ambulatory Visit (INDEPENDENT_AMBULATORY_CARE_PROVIDER_SITE_OTHER): Payer: PPO | Admitting: Ophthalmology

## 2022-09-12 DIAGNOSIS — Z961 Presence of intraocular lens: Secondary | ICD-10-CM

## 2022-09-12 DIAGNOSIS — H353122 Nonexudative age-related macular degeneration, left eye, intermediate dry stage: Secondary | ICD-10-CM | POA: Diagnosis not present

## 2022-09-12 DIAGNOSIS — H35033 Hypertensive retinopathy, bilateral: Secondary | ICD-10-CM | POA: Diagnosis not present

## 2022-09-12 DIAGNOSIS — H353211 Exudative age-related macular degeneration, right eye, with active choroidal neovascularization: Secondary | ICD-10-CM

## 2022-09-12 DIAGNOSIS — I10 Essential (primary) hypertension: Secondary | ICD-10-CM | POA: Diagnosis not present

## 2022-09-12 MED ORDER — AFLIBERCEPT 2MG/0.05ML IZ SOLN FOR KALEIDOSCOPE
2.0000 mg | INTRAVITREAL | Status: AC | PRN
  Administered 2022-09-12: 2 mg via INTRAVITREAL

## 2022-09-29 DIAGNOSIS — F3341 Major depressive disorder, recurrent, in partial remission: Secondary | ICD-10-CM | POA: Diagnosis not present

## 2022-09-29 DIAGNOSIS — K219 Gastro-esophageal reflux disease without esophagitis: Secondary | ICD-10-CM | POA: Diagnosis not present

## 2022-09-29 DIAGNOSIS — I10 Essential (primary) hypertension: Secondary | ICD-10-CM | POA: Diagnosis not present

## 2022-09-29 DIAGNOSIS — E782 Mixed hyperlipidemia: Secondary | ICD-10-CM | POA: Diagnosis not present

## 2022-09-29 DIAGNOSIS — G8929 Other chronic pain: Secondary | ICD-10-CM | POA: Diagnosis not present

## 2022-10-07 NOTE — Progress Notes (Signed)
[facility] Note  10/15/2022    CHIEF COMPLAINT Patient presents for Retina Follow Up   HISTORY OF PRESENT ILLNESS: [name] is a 81 y.o. female who presents to the clinic today for:   HPI     Retina Follow Up   Patient presents with  Wet AMD.  In right eye.  Severity is moderate.  Duration of 5 weeks.  Since onset it is stable.  I, the attending physician,  performed the HPI with the patient and updated documentation appropriately.        Comments   Pt here for 5 wk ret f/u exu ARMD OD. PT states VA is about the same, watering OU.       Last edited by Bernarda Caffey, MD on 10/15/2022  5:24 PM.      Referring physician: Kristen Loader, [place],  [place] [id number]  HISTORICAL INFORMATION:   Selected notes from the MEDICAL RECORD NUMBER Referred by Dr. Lucianne Lei for ex ARMD LEE:  Ocular Hx- PMH-    CURRENT MEDICATIONS: Current Outpatient Medications (Ophthalmic Drugs)  Medication Sig   cycloSPORINE (RESTASIS) 0.05 % ophthalmic emulsion Restasis 0.05 % eye drops in a dropperette   No current facility-administered medications for this visit. (Ophthalmic Drugs)   Current Outpatient Medications (Other)  Medication Sig   Bilberry, Vaccinium myrtillus, (BILBERRY PO) Take 1 capsule by mouth daily.   Cholecalciferol (D3-1000 PO) Take by mouth.   conjugated estrogens (PREMARIN) vaginal cream Uses 14 days a month   fluticasone (FLONASE) 50 MCG/ACT nasal spray Place 2 sprays into both nostrils daily.   ibuprofen (ADVIL) 800 MG tablet Take 1 tablet (800 mg total) by mouth every 8 (eight) hours as needed.   Loperamide HCl (IMODIUM PO) Take by mouth. PRN   methocarbamol (ROBAXIN) 500 MG tablet Take 1 tablet (500 mg total) by mouth every 8 (eight) hours as needed for muscle spasms. Take mostly at bedtime   metoprolol succinate (TOPROL-XL) 50 MG 24 hr tablet daily. Takes for headaches   mometasone (ELOCON) 0.1 % cream 1  application   Multiple Vitamins-Minerals (ICAPS AREDS 2 PO) Take by mouth daily.   Multiple Vitamins-Minerals (ICAPS LUTEIN & ZEAXANTHIN PO) Take by mouth.   omeprazole (PRILOSEC) 40 MG capsule Take 1 capsule by mouth daily.   ondansetron (ZOFRAN) 4 MG tablet Takes PRN   rosuvastatin (CRESTOR) 10 MG tablet    terbinafine (LAMISIL) 250 MG tablet Take 1 tablet (250 mg total) by mouth daily.   triamcinolone cream (KENALOG) 0.1 %    venlafaxine XR (EFFEXOR-XR) 75 MG 24 hr capsule TAKE ONE CAPSULE BY MOUTH ONCE DAILY WITH  BREAKFAST   amoxicillin-clavulanate (AUGMENTIN) 875-125 MG tablet Take 1 tablet by mouth every 12 (twelve) hours. (Patient not taking: Reported on 08/19/2022)   azelastine (ASTELIN) 0.1 % nasal spray USE TWO SPRAYS IN EACH NOSTRIL TWICE DAILY AS DIRECTED (Patient not taking: Reported on 10/15/2022)   No current facility-administered medications for this visit. (Other)   REVIEW OF SYSTEMS: ROS   Positive for: Eyes, Psychiatric Negative for: Constitutional, Gastrointestinal, Neurological, Skin, Genitourinary, Musculoskeletal, HENT, Endocrine, Cardiovascular, Respiratory, Allergic/Imm, Heme/Lymph Last edited by Kingsley Spittle, COT on 10/15/2022  1:01 PM.      ALLERGIES Allergies  Allergen Reactions   Levaquin [Levofloxacin In D5w] Nausea Only    Dizziness and tremulousness   PAST MEDICAL HISTORY Past Medical History:  Diagnosis Date   Actinic keratosis 12/03/2012   Allergy  Anxiety    Effexor helps--psychiatrist Dr Toy Care   Arthritis    osteoarthritis, hands   Atypical chest pain    EF 86%, breast attenuation, no significant ischemia   Cataract    Cervical radiculopathy 11/18/2013   Chronic headache    takes Metoprolol for this   Diverticulosis    Erosive esophagitis    GERD (gastroesophageal reflux disease) 11/2009   EGD: Dr. 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   Past Surgical History:  Procedure Laterality Date   AUGMENTATION MAMMAPLASTY Bilateral    silicone gel implants   Birth mark removed     CATARACT EXTRACTION Bilateral    COLONOSCOPY     COLPOSCOPY     laser vaporization of upper vagina  1998   NM MYOCAR PERF WALL MOTION  01/24/2008   EF 86% neg ischemia   VAGINAL HYSTERECTOMY  1978   FAMILY HISTORY Family History  Problem Relation Age of Onset   Heart disease Mother    Heart failure Mother    Breast cancer Mother 49   Prostate cancer Father    Inflammatory bowel disease Sister    Diabetes Maternal Aunt    Colon cancer Paternal Grandfather    Stomach cancer Neg Hx    Pancreatic cancer Neg Hx    Esophageal cancer Neg Hx    Rectal cancer Neg Hx    SOCIAL HISTORY Social History   Tobacco Use   Smoking status: Former    Packs/day: 1.00    Years: 10.00    Total pack years: 10.00    Types: Cigarettes    Quit date: 12/15/1988    Years since quitting: 33.8   Smokeless tobacco: Never   Tobacco comments:    quit in the 1980's  Vaping Use   Vaping Use: Never used  Substance Use Topics   Alcohol use: Yes    Alcohol/week: 14.0 standard drinks of alcohol    Types: 14 Standard drinks or equivalent per week    Comment: drinks wine every day; 2 drinks a day    Drug use: No       OPHTHALMIC EXAM:  Base Eye Exam     Visual Acuity (Snellen - Linear)       Right Left   Dist [place] 20/30 20/30  Pt did not bring rx specs w/ her, used phoropter. MS        Tonometry (Tonopen, 1:07 PM)       Right Left   Pressure 17 14         Pupils       Dark Light Shape React APD   Right 5 4 Round Brisk None   Left 5 4 Round Brisk None         Visual Fields  (Counting fingers)       Left Right    Full Full         Extraocular Movement       Right Left    Full, Ortho Full, Ortho         Neuro/Psych     Oriented x3: Yes  Mood/Affect: Normal         Dilation     Both eyes: 2.5% Phenylephrine, 1.0% Mydriacyl @ 1:08 PM           Slit Lamp and Fundus Exam     Slit Lamp Exam       Right Left   Lids/Lashes Dermatochalasis - upper lid, mild MGD Dermatochalasis - upper lid, mild MGD   Conjunctiva/Sclera nasal pingeucula White and quiet   Cornea arcus, trace PEE, well healed cataract wound arcus, trace PEE, well healed cataract wound   Anterior Chamber deep, clear, narrow temporal angle Deep and quiet   Iris Round and dilated Round and dilated   Lens PC IOL in good position, trace Posterior capsular opacification PC IOL in good position with open PC   Anterior Vitreous syneresis, Posterior vitreous detachment, vitreous condensations syneresis, Posterior vitreous detachment         Fundus Exam       Right Left   Disc Pink and Sharp Pink and Sharp, +PPP ST rim   C/D Ratio 0.2 0.2   Macula Blunted foveal reflex, central edema / shallow SRF -- persistent, no heme, +drusen, RPE mottling Blunted foveal reflex, Drusen, RPE mottling and clumping, No heme or edema   Vessels attenuated, Tortuous mild attenuation, mild tortuosity   Periphery Attached, mild midzonal drusen, no heme Attached, mild midzonal drusen, no heme           IMAGING AND PROCEDURES  Imaging and Procedures for 10/15/2022  OCT, Retina - OU - Both Eyes       Right Eye Quality was good. Central Foveal Thickness: 243. Progression has been stable. Findings include no IRF, no SRF, abnormal foveal contour, retinal drusen , intraretinal hyper-reflective material, pigment epithelial detachment, outer retinal atrophy (Persistent shallow SRF overlying stable PED temporal fovea; +ORA).   Left Eye Quality was good. Central Foveal Thickness: 278. Progression has  been stable. Findings include normal foveal contour, no IRF, no SRF, retinal drusen , outer retinal atrophy (Patchy ORA).   Notes *Images captured and stored on drive  Diagnosis / Impression:  OD: exudative ARMD - Persistent shallow SRF overlying stable PED temporal fovea; +ORA OS: non-exu ARMD, patchy ORA  Clinical management:  See below  Abbreviations: NFP - Normal foveal profile. CME - cystoid macular edema. PED - pigment epithelial detachment. IRF - intraretinal fluid. SRF - subretinal fluid. EZ - ellipsoid zone. ERM - epiretinal membrane. ORA - outer retinal atrophy. ORT - outer retinal tubulation. SRHM - subretinal hyper-reflective material. IRHM - intraretinal hyper-reflective material      Intravitreal Injection, Pharmacologic Agent - OD - Right Eye       Time Out 10/15/2022. 1:29 PM. Confirmed correct patient, procedure, site, and patient consented.   Anesthesia Topical anesthesia was used. Anesthetic medications included Lidocaine 2%, Proparacaine 0.5%.   Procedure Preparation included 5% betadine to ocular surface, eyelid speculum. A (32g) needle was used.   Injection: 2 mg aflibercept 2 MG/0.05ML   Route: Intravitreal, Site: Right Eye   NDC: [id number], Lot: [id number], Expiration date: 01/14/2024, Waste: 0 mL   Post-op Post injection exam found visual acuity of at least counting fingers. The patient tolerated the procedure well. There were no complications. The patient received written and verbal post procedure care education. Post injection medications were not given.             ASSESSMENT/PLAN:    ICD-10-CM   1. Exudative age-related macular degeneration of right eye with active choroidal  neovascularization (HCC)  H35.3211 OCT, Retina - OU - Both Eyes    Intravitreal Injection, Pharmacologic Agent - OD - Right Eye    aflibercept (EYLEA) SOLN 2 mg    2. Intermediate stage nonexudative age-related macular degeneration of left eye  H35.3122     3.  Essential hypertension  I10     4. Hypertensive retinopathy of both eyes  H35.033     5. Pseudophakia, both eyes  Z96.1       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, right eye    - s/p IVA OD #1 (04.03.23), #2 (05.08.23), #3 (06.05.23), #4 (07.11.23), #5 (08.22.23)  - s/p IVE OD #1 (09.23.23)  - at initial visit, pt reported 1-2 mo history of central "spot" in vision OD  - FA (04.03.23) shows +CNV with mild late leakage IT fovea, no obvious smokestack or smoking dot  - BCVA stable at 20/30   - OCT shows: Persistent shallow SRF overlying PED temporal fovea; +ORA at 5 weeks  **interval increase in SRF noted on OCT at 6 weeks on 08.22.23**  - pt reports high stress and +steroid use -- ?CSR component  - recommend IVE OD #2 today, 10.11.23- with follow up in 5 weeks.  - pt wishes to be treated with IVE OD  - RBA of procedure discussed, questions answered - IVE OD informed consent obtained and signed on 09.29.23 - IVA OD informed consent obtained and signed on 04.03.23 - see procedure note   - f/u in 5 wks, DFE, OCT, possible injection  2. Age related macular degeneration, non-exudative, left eye  - The incidence, anatomy, and pathology of dry AMD, risk of progression, and the AREDS and AREDS 2 study including smoking risks discussed with patient.  - Recommend amsler grid monitoring  - f/u in 5 weeks DFE, OCT  3,4. Hypertensive retinopathy OU - discussed importance of tight BP control - continue to monitor  5. Pseudophakia OU  - s/p CE/IOL (Dr. Elliot Dally)  - IOL in good position, doing well  - continue to monitor  Ophthalmic Meds Ordered this visit:  Meds ordered this encounter  Medications   aflibercept (EYLEA) SOLN 2 mg     Return in about 5 weeks (around 11/19/2022) for f/u exu ARMD OD, DFE, OCT.  There are no Patient Instructions on file for this visit.  Explained the diagnoses, plan, and follow up with the patient and they expressed understanding.  Patient expressed  understanding of the importance of proper follow up care.   This document serves as a record of services personally performed by Gardiner Sleeper, MD, PhD. It was created on their behalf by San Jetty. Owens Shark, OA an ophthalmic technician. The creation of this record is the provider's dictation and/or activities during the visit.    Electronically signed by: San Jetty. Owens Shark, [place] 11.01.2023 2:49 AM  Gardiner Sleeper, M.D., Ph.D. Diseases & Surgery of the Retina and Vitreous [facility]  I have reviewed the above documentation for accuracy and completeness, and I agree with the above. Gardiner Sleeper, M.D., Ph.D. 10/19/22 2:50 AM  Abbreviations: M myopia (nearsighted); A astigmatism; H hyperopia (farsighted); P presbyopia; Mrx spectacle prescription;  CTL contact lenses; OD right eye; OS left eye; OU both eyes  XT exotropia; ET esotropia; PEK punctate epithelial keratitis; PEE punctate epithelial erosions; DES dry eye syndrome; MGD meibomian gland dysfunction; ATs artificial tears; PFAT's preservative free artificial tears; [place] nuclear sclerotic cataract; PSC posterior subcapsular cataract; ERM epi-retinal  membrane; PVD posterior vitreous detachment; RD retinal detachment; DM diabetes mellitus; DR diabetic retinopathy; NPDR non-proliferative diabetic retinopathy; PDR proliferative diabetic retinopathy; CSME clinically significant macular edema; DME diabetic macular edema; dbh dot blot hemorrhages; CWS cotton wool spot; POAG primary open angle glaucoma; C/D cup-to-disc ratio; HVF humphrey visual field; GVF goldmann visual field; OCT optical coherence tomography; IOP intraocular pressure; BRVO Branch retinal vein occlusion; CRVO central retinal vein occlusion; CRAO central retinal artery occlusion; BRAO branch retinal artery occlusion; RT retinal tear; SB scleral buckle; PPV pars plana vitrectomy; VH Vitreous hemorrhage; PRP panretinal laser photocoagulation; IVK intravitreal kenalog; VMT  vitreomacular traction; MH Macular hole;  NVD neovascularization of the disc; NVE neovascularization elsewhere; AREDS age related eye disease study; ARMD age related macular degeneration; POAG primary open angle glaucoma; EBMD epithelial/anterior basement membrane dystrophy; ACIOL anterior chamber intraocular lens; IOL intraocular lens; PCIOL posterior chamber intraocular lens; Phaco/IOL phacoemulsification with intraocular lens placement; [place] photorefractive keratectomy; LASIK laser assisted in situ keratomileusis; HTN hypertension; DM diabetes mellitus; COPD chronic obstructive pulmonary disease

## 2022-10-15 ENCOUNTER — Ambulatory Visit (INDEPENDENT_AMBULATORY_CARE_PROVIDER_SITE_OTHER): Payer: PPO | Admitting: Ophthalmology

## 2022-10-15 ENCOUNTER — Encounter (INDEPENDENT_AMBULATORY_CARE_PROVIDER_SITE_OTHER): Payer: Self-pay | Admitting: Ophthalmology

## 2022-10-15 DIAGNOSIS — I10 Essential (primary) hypertension: Secondary | ICD-10-CM | POA: Diagnosis not present

## 2022-10-15 DIAGNOSIS — H353211 Exudative age-related macular degeneration, right eye, with active choroidal neovascularization: Secondary | ICD-10-CM | POA: Diagnosis not present

## 2022-10-15 DIAGNOSIS — H353122 Nonexudative age-related macular degeneration, left eye, intermediate dry stage: Secondary | ICD-10-CM

## 2022-10-15 DIAGNOSIS — Z961 Presence of intraocular lens: Secondary | ICD-10-CM | POA: Diagnosis not present

## 2022-10-15 DIAGNOSIS — H35033 Hypertensive retinopathy, bilateral: Secondary | ICD-10-CM

## 2022-10-15 MED ORDER — AFLIBERCEPT 2MG/0.05ML IZ SOLN FOR KALEIDOSCOPE
2.0000 mg | INTRAVITREAL | Status: AC | PRN
  Administered 2022-10-15: 2 mg via INTRAVITREAL

## 2022-10-27 DIAGNOSIS — I10 Essential (primary) hypertension: Secondary | ICD-10-CM | POA: Diagnosis not present

## 2022-10-27 DIAGNOSIS — F3341 Major depressive disorder, recurrent, in partial remission: Secondary | ICD-10-CM | POA: Diagnosis not present

## 2022-10-27 DIAGNOSIS — E559 Vitamin D deficiency, unspecified: Secondary | ICD-10-CM | POA: Diagnosis not present

## 2022-10-27 DIAGNOSIS — K219 Gastro-esophageal reflux disease without esophagitis: Secondary | ICD-10-CM | POA: Diagnosis not present

## 2022-11-18 NOTE — Progress Notes (Signed)
[facility] Note  11/19/2022    CHIEF COMPLAINT Patient presents for Retina Follow Up   HISTORY OF PRESENT ILLNESS: [name] is a 81 y.o. female who presents to the clinic today for:   HPI     Retina Follow Up   Patient presents with  Wet AMD.  In right eye.  This started 5 weeks ago.  Severity is moderate.  Duration of 5 weeks.  Since onset it is stable.  I, the attending physician,  performed the HPI with the patient and updated documentation appropriately.        Comments   5 week ARMD OD and I'VE OD pt states her vision has not been as sharp since her last exam just not seeing as well she is having floaters in her right eye at times denies flashes       Last edited by Bernarda Caffey, MD on 11/19/2022  1:59 PM.     Patient states that she feels the injection is wearing off, vision is a little blurry.  Referring physician: Kristen Loader, FNP [address],  [place] [address]  HISTORICAL INFORMATION:   Selected notes from the MEDICAL RECORD NUMBER Referred by Dr. Lucianne Lei for ex ARMD LEE:  Ocular Hx- PMH-    CURRENT MEDICATIONS: Current Outpatient Medications (Ophthalmic Drugs)  Medication Sig   cycloSPORINE (RESTASIS) 0.05 % ophthalmic emulsion Restasis 0.05 % eye drops in a dropperette   No current facility-administered medications for this visit. (Ophthalmic Drugs)   Current Outpatient Medications (Other)  Medication Sig   amoxicillin-clavulanate (AUGMENTIN) 875-125 MG tablet Take 1 tablet by mouth every 12 (twelve) hours. (Patient not taking: Reported on 08/19/2022)   azelastine (ASTELIN) 0.1 % nasal spray USE TWO SPRAYS IN EACH NOSTRIL TWICE DAILY AS DIRECTED (Patient not taking: Reported on 10/15/2022)   Bilberry, Vaccinium myrtillus, (BILBERRY PO) Take 1 capsule by mouth daily.   Cholecalciferol (D3-1000 PO) Take by mouth.   conjugated estrogens (PREMARIN) vaginal cream Uses 14 days a month   fluticasone (FLONASE) 50  MCG/ACT nasal spray Place 2 sprays into both nostrils daily.   ibuprofen (ADVIL) 800 MG tablet Take 1 tablet (800 mg total) by mouth every 8 (eight) hours as needed.   Loperamide HCl (IMODIUM PO) Take by mouth. PRN   methocarbamol (ROBAXIN) 500 MG tablet Take 1 tablet (500 mg total) by mouth every 8 (eight) hours as needed for muscle spasms. Take mostly at bedtime   metoprolol succinate (TOPROL-XL) 50 MG 24 hr tablet daily. Takes for headaches   mometasone (ELOCON) 0.1 % cream 1 application   Multiple Vitamins-Minerals (ICAPS AREDS 2 PO) Take by mouth daily.   Multiple Vitamins-Minerals (ICAPS LUTEIN & ZEAXANTHIN PO) Take by mouth.   omeprazole (PRILOSEC) 40 MG capsule Take 1 capsule by mouth daily.   ondansetron (ZOFRAN) 4 MG tablet Takes PRN   rosuvastatin (CRESTOR) 10 MG tablet    terbinafine (LAMISIL) 250 MG tablet Take 1 tablet (250 mg total) by mouth daily.   triamcinolone cream (KENALOG) 0.1 %    venlafaxine XR (EFFEXOR-XR) 75 MG 24 hr capsule TAKE ONE CAPSULE BY MOUTH ONCE DAILY WITH  BREAKFAST   No current facility-administered medications for this visit. (Other)   REVIEW OF SYSTEMS:    ALLERGIES Allergies  Allergen Reactions   Levaquin [Levofloxacin In D5w] Nausea Only    Dizziness and tremulousness   PAST MEDICAL HISTORY Past Medical History:  Diagnosis Date   Actinic keratosis  12/03/2012   Allergy    Anxiety    Effexor helps--psychiatrist Dr Toy Care   Arthritis    osteoarthritis, hands   Atypical chest pain    EF 86%, breast attenuation, no significant ischemia   Cataract    Cervical radiculopathy 11/18/2013   Chronic headache    takes Metoprolol for this   Diverticulosis    Erosive esophagitis    GERD (gastroesophageal reflux disease) 11/2009   EGD: Dr. 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   Past Surgical History:  Procedure Laterality Date   AUGMENTATION MAMMAPLASTY Bilateral    silicone gel implants   Birth mark removed     CATARACT EXTRACTION Bilateral    COLONOSCOPY     COLPOSCOPY     laser vaporization of upper vagina  1998   NM MYOCAR PERF WALL MOTION  01/24/2008   EF 86% neg ischemia   VAGINAL HYSTERECTOMY  1978   FAMILY HISTORY Family History  Problem Relation Age of Onset   Heart disease Mother    Heart failure Mother    Breast cancer Mother 38   Prostate cancer Father    Inflammatory bowel disease Sister    Diabetes Maternal Aunt    Colon cancer Paternal Grandfather    Stomach cancer Neg Hx    Pancreatic cancer Neg Hx    Esophageal cancer Neg Hx    Rectal cancer Neg Hx    SOCIAL HISTORY Social History   Tobacco Use   Smoking status: Former    Packs/day: 1.00    Years: 10.00    Total pack years: 10.00    Types: Cigarettes    Quit date: 12/15/1988    Years since quitting: 33.9   Smokeless tobacco: Never   Tobacco comments:    quit in the 1980's  Vaping Use   Vaping Use: Never used  Substance Use Topics   Alcohol use: Yes    Alcohol/week: 14.0 standard drinks of alcohol    Types: 14 Standard drinks or equivalent per week    Comment: drinks wine every day; 2 drinks a day    Drug use: No       OPHTHALMIC EXAM:  Base Eye Exam     Visual Acuity (Snellen - Linear)       Right Left   Dist cc 20/40 -1 20/30 -1   Dist ph cc NI     Correction: Glasses         Tonometry (Tonopen, 1:23 PM)       Right Left   Pressure 12 13         Pupils       Pupils Dark Light Shape React APD   Right PERRL 4 3 Round Brisk None   Left PERRL 4 3 Round Brisk None         Visual Fields       Left Right    Full Full         Extraocular Movement        Right Left    Full, Ortho Full, Ortho  Neuro/Psych     Oriented x3: Yes   Mood/Affect: Normal         Dilation     Both eyes: 2.5% Phenylephrine @ 1:23 PM           Slit Lamp and Fundus Exam     Slit Lamp Exam       Right Left   Lids/Lashes Dermatochalasis - upper lid, mild MGD Dermatochalasis - upper lid, mild MGD   Conjunctiva/Sclera nasal pingeucula White and quiet   Cornea arcus, trace PEE, well healed cataract wound arcus, trace PEE, well healed cataract wound   Anterior Chamber deep, clear, narrow temporal angle Deep and quiet   Iris Round and dilated Round and dilated   Lens PC IOL in good position, trace Posterior capsular opacification PC IOL in good position with open PC   Anterior Vitreous syneresis, Posterior vitreous detachment, vitreous condensations syneresis, Posterior vitreous detachment         Fundus Exam       Right Left   Disc Pink and Sharp Pink and Sharp, +PPP ST rim   C/D Ratio 0.2 0.2   Macula Blunted foveal reflex, central edema / shallow SRF -- improved, no heme, +drusen, RPE mottling Blunted foveal reflex, Drusen, RPE mottling and clumping, No heme or edema   Vessels attenuated, Tortuous mild attenuation, mild tortuosity   Periphery Attached, mild midzonal drusen, no heme Attached, mild midzonal drusen, no heme           Refraction     Wearing Rx       Sphere Cylinder Axis Add   Right -0.50 +0.50 146 +2.50   Left -1.25 +1.25 167 +2.50           IMAGING AND PROCEDURES  Imaging and Procedures for 11/19/2022  OCT, Retina - OU - Both Eyes       Right Eye Quality was good. Central Foveal Thickness: 232. Progression has improved. Findings include normal foveal contour, no IRF, no SRF, retinal drusen , intraretinal hyper-reflective material, pigment epithelial detachment, outer retinal atrophy (Interval improvement/resolution of shallow SRF overlying stable low PED temporal fovea; +ORA).   Left Eye Quality was  good. Central Foveal Thickness: 276. Progression has been stable. Findings include normal foveal contour, no IRF, no SRF, retinal drusen , outer retinal atrophy (Patchy ORA).   Notes *Images captured and stored on drive  Diagnosis / Impression:  OD: Interval improvement/resolution of shallow SRF overlying stable low PED temporal fovea; +ORA OS: non-exu ARMD, patchy ORA  Clinical management:  See below  Abbreviations: NFP - Normal foveal profile. CME - cystoid macular edema. PED - pigment epithelial detachment. IRF - intraretinal fluid. SRF - subretinal fluid. EZ - ellipsoid zone. ERM - epiretinal membrane. ORA - outer retinal atrophy. ORT - outer retinal tubulation. SRHM - subretinal hyper-reflective material. IRHM - intraretinal hyper-reflective material      Intravitreal Injection, Pharmacologic Agent - OD - Right Eye       Time Out 11/19/2022. 2:05 PM. Confirmed correct patient, procedure, site, and patient consented.   Anesthesia Topical anesthesia was used. Anesthetic medications included Lidocaine 2%, Proparacaine 0.5%.   Procedure Preparation included 5% betadine to ocular surface, eyelid speculum. A (32g) needle was used.   Injection: 2 mg aflibercept 2 MG/0.05ML   Route: Intravitreal, Site: Right Eye   NDC: [id number], Lot: [id number], Expiration date: 02/12/2024, Waste: 0 mL   Post-op Post injection exam found visual acuity of at least counting fingers. The patient tolerated the procedure  well. There were no complications. The patient received written and verbal post procedure care education. Post injection medications were not given.   Notes An AC tap was performed following injection due to elevated IOP using a 30 gauge needle on a syringe with the plunger removed. The needle was placed at the limbus at 5 oclock and approximately 0.08 cc of aqueous was removed from the anterior chamber. Betadine was applied to the tap area before and after the paracentesis was  performed. There were no complications. The patient tolerated the procedure well. The IOP was rechecked and was found to be 9 mmHg by palpation.           ASSESSMENT/PLAN:    ICD-10-CM   1. Exudative age-related macular degeneration of right eye with active choroidal neovascularization (HCC)  H35.3211 OCT, Retina - OU - Both Eyes    Intravitreal Injection, Pharmacologic Agent - OD - Right Eye    aflibercept (EYLEA) SOLN 2 mg    2. Intermediate stage nonexudative age-related macular degeneration of left eye  H35.3122     3. Essential hypertension  I10     4. Hypertensive retinopathy of both eyes  H35.033     5. 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, right eye    - s/p IVA OD #1 (04.03.23), #2 (05.08.23), #3 (06.05.23), #4 (07.11.23), #5 (08.22.23)  - s/p IVE OD #1 (09.23.23), #2 (10.11.23)  - at initial visit, pt reported 1-2 mo history of central "spot" in vision OD  - FA (04.03.23) shows +CNV with mild late leakage IT fovea, no obvious smokestack or smoking dot  - BCVA stable at 20/40 decreased from 20/30   - OCT shows: Interval improvement/resolution of shallow SRF overlying stable low PED temporal fovea; +ORA 5 weeks  **interval increase in SRF on Avastin noted on OCT at 6 weeks on 08.22.23**  - pt reports high stress and +steroid use -- ?CSR component  - recommend IVE OD #3 today, 12.06.23- with follow up in 6 weeks.  - pt wishes to be treated with IVE OD  - RBA of procedure discussed, questions answered - IVE OD informed consent obtained and signed on 09.29.23 - IVA OD informed consent obtained and signed on 04.03.23 - see procedure note   - f/u in 6 wks, DFE, OCT, possible injection  2. Age related macular degeneration, non-exudative, left eye  - The incidence, anatomy, and pathology of dry AMD, risk of progression, and the AREDS and AREDS 2 study including smoking risks discussed with patient.  - Recommend amsler grid  monitoring  - f/u in 6 weeks DFE, OCT  3,4. Hypertensive retinopathy OU - discussed importance of tight BP control - continue to monitor  5. Pseudophakia OU  - s/p CE/IOL (Dr. Elliot Dally)  - IOL in good position, doing well  - continue to monitor  Ophthalmic Meds Ordered this visit:  Meds ordered this encounter  Medications   aflibercept (EYLEA) SOLN 2 mg     Return in about 6 weeks (around 12/31/2022) for f/u Ex. AMD OD , DFE, OCT, IVE, OD.  There are no Patient Instructions on file for this visit.  Explained the diagnoses, plan, and follow up with the patient and they expressed understanding.  Patient expressed understanding of the importance of proper follow up care.   This document serves as a record of services personally performed by Gardiner Sleeper, MD, PhD. It was created on their behalf by  Orvan Falconer, an ophthalmic technician. The creation of this record is the provider's dictation and/or activities during the visit.    Electronically signed by: Orvan Falconer, OA, 11/19/22  2:35 PM  This document serves as a record of services personally performed by Gardiner Sleeper, MD, PhD. It was created on their behalf by Renaldo Reel, [place] an ophthalmic technician. The creation of this record is the provider's dictation and/or activities during the visit.    Electronically signed by:  Renaldo Reel, COT 11.06.23 2:35 PM   Gardiner Sleeper, M.D., Ph.D. Diseases & Surgery of the Retina and Vitreous [facility]  I have reviewed the above documentation for accuracy and completeness, and I agree with the above. Gardiner Sleeper, M.D., Ph.D. 11/19/22 2:36 PM  Abbreviations: M myopia (nearsighted); A astigmatism; H hyperopia (farsighted); P presbyopia; Mrx spectacle prescription;  CTL contact lenses; OD right eye; OS left eye; OU both eyes  XT exotropia; ET esotropia; PEK punctate epithelial keratitis; PEE punctate epithelial erosions; DES dry eye  syndrome; MGD meibomian gland dysfunction; ATs artificial tears; PFAT's preservative free artificial tears; [place] nuclear sclerotic cataract; PSC posterior subcapsular cataract; ERM epi-retinal membrane; PVD posterior vitreous detachment; RD retinal detachment; DM diabetes mellitus; DR diabetic retinopathy; NPDR non-proliferative diabetic retinopathy; PDR proliferative diabetic retinopathy; CSME clinically significant macular edema; DME diabetic macular edema; dbh dot blot hemorrhages; CWS cotton wool spot; POAG primary open angle glaucoma; C/D cup-to-disc ratio; HVF humphrey visual field; GVF goldmann visual field; OCT optical coherence tomography; IOP intraocular pressure; BRVO Branch retinal vein occlusion; CRVO central retinal vein occlusion; CRAO central retinal artery occlusion; BRAO branch retinal artery occlusion; RT retinal tear; SB scleral buckle; PPV pars plana vitrectomy; VH Vitreous hemorrhage; PRP panretinal laser photocoagulation; IVK intravitreal kenalog; VMT vitreomacular traction; MH Macular hole;  NVD neovascularization of the disc; NVE neovascularization elsewhere; AREDS age related eye disease study; ARMD age related macular degeneration; POAG primary open angle glaucoma; EBMD epithelial/anterior basement membrane dystrophy; ACIOL anterior chamber intraocular lens; IOL intraocular lens; PCIOL posterior chamber intraocular lens; Phaco/IOL phacoemulsification with intraocular lens placement; [place] photorefractive keratectomy; LASIK laser assisted in situ keratomileusis; HTN hypertension; DM diabetes mellitus; COPD chronic obstructive pulmonary disease

## 2022-11-19 ENCOUNTER — Encounter (INDEPENDENT_AMBULATORY_CARE_PROVIDER_SITE_OTHER): Payer: Self-pay | Admitting: Ophthalmology

## 2022-11-19 ENCOUNTER — Ambulatory Visit (INDEPENDENT_AMBULATORY_CARE_PROVIDER_SITE_OTHER): Payer: PPO | Admitting: Ophthalmology

## 2022-11-19 DIAGNOSIS — H353122 Nonexudative age-related macular degeneration, left eye, intermediate dry stage: Secondary | ICD-10-CM | POA: Diagnosis not present

## 2022-11-19 DIAGNOSIS — Z961 Presence of intraocular lens: Secondary | ICD-10-CM

## 2022-11-19 DIAGNOSIS — I10 Essential (primary) hypertension: Secondary | ICD-10-CM | POA: Diagnosis not present

## 2022-11-19 DIAGNOSIS — H35033 Hypertensive retinopathy, bilateral: Secondary | ICD-10-CM

## 2022-11-19 DIAGNOSIS — H353211 Exudative age-related macular degeneration, right eye, with active choroidal neovascularization: Secondary | ICD-10-CM | POA: Diagnosis not present

## 2022-11-19 MED ORDER — AFLIBERCEPT 2MG/0.05ML IZ SOLN FOR KALEIDOSCOPE
2.0000 mg | INTRAVITREAL | Status: AC | PRN
  Administered 2022-11-19: 2 mg via INTRAVITREAL

## 2022-12-05 ENCOUNTER — Ambulatory Visit (INDEPENDENT_AMBULATORY_CARE_PROVIDER_SITE_OTHER): Payer: PPO | Admitting: Podiatry

## 2022-12-05 DIAGNOSIS — L600 Ingrowing nail: Secondary | ICD-10-CM

## 2022-12-05 DIAGNOSIS — L539 Erythematous condition, unspecified: Secondary | ICD-10-CM | POA: Diagnosis not present

## 2022-12-05 DIAGNOSIS — R52 Pain, unspecified: Secondary | ICD-10-CM | POA: Diagnosis not present

## 2022-12-05 DIAGNOSIS — L603 Nail dystrophy: Secondary | ICD-10-CM | POA: Diagnosis not present

## 2022-12-05 DIAGNOSIS — B351 Tinea unguium: Secondary | ICD-10-CM | POA: Diagnosis not present

## 2022-12-05 MED ORDER — CEPHALEXIN 500 MG PO CAPS: 500.0000 mg | ORAL_CAPSULE | Freq: Three times a day (TID) | ORAL | 0 refills | Status: DC

## 2022-12-05 NOTE — Progress Notes (Unsigned)
Chief Complaint  Patient presents with   Ingrown Toenail    Right foot 3rd toe possible ingrown with discharge

## 2022-12-05 NOTE — Patient Instructions (Signed)

## 2022-12-10 NOTE — Progress Notes (Signed)
Subjective: Chief Complaint  Patient presents with   Ingrown Toenail    Right foot 3rd toe possible ingrown with discharge     81 year old female presents the office with above concerns, which are new.  She states the right third toenail was loose is getting red and swollen on the base.  The nails been thick for quite some time.  She does not report any drainage or pus.   Objective: AAO x3, NAD DP/PT pulses palpable bilaterally, CRT less than 3 seconds Right third nail is hypertrophic, dystrophic and was loosening underlying nailbed only-on the proximal nail fold there is localized edema and erythema to the proximal nail fold without any ascending cellulitis.  Tenderness palpation of the toenail itself. No pain with calf compression, swelling, warmth, erythema  Assessment: 81 year old female right third digit onycholysis, localized erythema  Plan: -All treatment options discussed with the patient including all alternatives, risks, complications.  -At this time, recommended total nail removal due to loosening of the nail.  Risks and complications were discussed with the patient for which they understand and  verbally consent to the procedure. Under sterile conditions a total of 3 mL of a mixture of 2% lidocaine plain and 0.5% Marcaine plain was infiltrated in a digital block fashion. Once anesthetized, the skin was prepped in sterile fashion. A tourniquet was then applied. Next the 3rd digit nail was removed in total making sure to remove the entire offending nail border. Once the nail was  Removed, the area was debrided and the underlying skin was intact. The area was irrigated and hemostasis was obtained.  A dry sterile dressing was applied. After application of the dressing the tourniquet was removed and there is found to be an immediate capillary refill time to the digit. The patient tolerated the procedure well any complications. Post procedure instructions were discussed the patient for  which he verbally understood. Follow-up in one week for nail check or sooner if any problems are to arise. Discussed signs/symptoms of worsening infection and directed to call the office immediately should any occur or go directly to the emergency room. In the meantime, encouraged to call the office with any questions, concerns, changes symptoms. -Keflex -Nail sent for culture  -Patient encouraged to call the office with any questions, concerns, change in symptoms.   Trula Slade DPM

## 2022-12-17 DIAGNOSIS — R69 Illness, unspecified: Secondary | ICD-10-CM | POA: Diagnosis not present

## 2022-12-23 ENCOUNTER — Encounter: Payer: Self-pay | Admitting: Podiatry

## 2022-12-24 ENCOUNTER — Other Ambulatory Visit: Payer: Self-pay

## 2022-12-24 DIAGNOSIS — L539 Erythematous condition, unspecified: Secondary | ICD-10-CM

## 2022-12-25 NOTE — Progress Notes (Shared)
[facility] Note  12/31/2022    CHIEF COMPLAINT Patient presents for No chief complaint on file.   HISTORY OF PRESENT ILLNESS: [name] is a 82 y.o. female who presents to the clinic today for:      Referring physician: Kristen Loader, [place],  [place] [address]  HISTORICAL INFORMATION:   Selected notes from the MEDICAL RECORD NUMBER Referred by Dr. Lucianne Lei for ex ARMD LEE:  Ocular Hx- PMH-    CURRENT MEDICATIONS: Current Outpatient Medications (Ophthalmic Drugs)  Medication Sig   cycloSPORINE (RESTASIS) 0.05 % ophthalmic emulsion Restasis 0.05 % eye drops in a dropperette   No current facility-administered medications for this visit. (Ophthalmic Drugs)   Current Outpatient Medications (Other)  Medication Sig   amoxicillin-clavulanate (AUGMENTIN) 875-125 MG tablet Take 1 tablet by mouth every 12 (twelve) hours. (Patient not taking: Reported on 08/19/2022)   azelastine (ASTELIN) 0.1 % nasal spray USE TWO SPRAYS IN EACH NOSTRIL TWICE DAILY AS DIRECTED (Patient not taking: Reported on 10/15/2022)   Bilberry, Vaccinium myrtillus, (BILBERRY PO) Take 1 capsule by mouth daily.   cephALEXin (KEFLEX) 500 MG capsule Take 1 capsule (500 mg total) by mouth 3 (three) times daily.   Cholecalciferol (D3-1000 PO) Take by mouth.   conjugated estrogens (PREMARIN) vaginal cream Uses 14 days a month   fluticasone (FLONASE) 50 MCG/ACT nasal spray Place 2 sprays into both nostrils daily.   ibuprofen (ADVIL) 800 MG tablet Take 1 tablet (800 mg total) by mouth every 8 (eight) hours as needed.   Loperamide HCl (IMODIUM PO) Take by mouth. PRN   methocarbamol (ROBAXIN) 500 MG tablet Take 1 tablet (500 mg total) by mouth every 8 (eight) hours as needed for muscle spasms. Take mostly at bedtime   metoprolol succinate (TOPROL-XL) 50 MG 24 hr tablet daily. Takes for headaches   mometasone (ELOCON) 0.1 % cream 1 application   Multiple  Vitamins-Minerals (ICAPS AREDS 2 PO) Take by mouth daily.   Multiple Vitamins-Minerals (ICAPS LUTEIN & ZEAXANTHIN PO) Take by mouth.   omeprazole (PRILOSEC) 40 MG capsule Take 1 capsule by mouth daily.   ondansetron (ZOFRAN) 4 MG tablet Takes PRN   rosuvastatin (CRESTOR) 10 MG tablet    terbinafine (LAMISIL) 250 MG tablet Take 1 tablet (250 mg total) by mouth daily.   triamcinolone cream (KENALOG) 0.1 %    venlafaxine XR (EFFEXOR-XR) 75 MG 24 hr capsule TAKE ONE CAPSULE BY MOUTH ONCE DAILY WITH  BREAKFAST   No current facility-administered medications for this visit. (Other)   REVIEW OF SYSTEMS:    ALLERGIES Allergies  Allergen Reactions   Levaquin [Levofloxacin In D5w] Nausea Only    Dizziness and tremulousness   PAST MEDICAL HISTORY Past Medical History:  Diagnosis Date   Actinic keratosis 12/03/2012   Allergy    Anxiety    Effexor helps--psychiatrist Dr Toy Care   Arthritis    osteoarthritis, hands   Atypical chest pain    EF 86%, breast attenuation, no significant ischemia   Cataract    Cervical radiculopathy 11/18/2013   Chronic headache    takes Metoprolol for this   Diverticulosis    Erosive esophagitis    GERD (gastroesophageal reflux disease) 11/2009   EGD: Dr. 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   Past Surgical History:  Procedure Laterality Date   AUGMENTATION MAMMAPLASTY Bilateral    silicone gel implants   Birth mark removed     CATARACT EXTRACTION Bilateral    COLONOSCOPY     COLPOSCOPY     laser vaporization of upper vagina  1998   NM MYOCAR PERF WALL MOTION  01/24/2008   EF 86% neg ischemia   VAGINAL  HYSTERECTOMY  1978   FAMILY HISTORY Family History  Problem Relation Age of Onset   Heart disease Mother    Heart failure Mother    Breast cancer Mother 55   Prostate cancer Father    Inflammatory bowel disease Sister    Diabetes Maternal Aunt    Colon cancer Paternal Grandfather    Stomach cancer Neg Hx    Pancreatic cancer Neg Hx    Esophageal cancer Neg Hx    Rectal cancer Neg Hx    SOCIAL HISTORY Social History   Tobacco Use   Smoking status: Former    Packs/day: 1.00    Years: 10.00    Total pack years: 10.00    Types: Cigarettes    Quit date: 12/15/1988    Years since quitting: 34.0   Smokeless tobacco: Never   Tobacco comments:    quit in the 1980's  Vaping Use   Vaping Use: Never used  Substance Use Topics   Alcohol use: Yes    Alcohol/week: 14.0 standard drinks of alcohol    Types: 14 Standard drinks or equivalent per week    Comment: drinks wine every day; 2 drinks a day    Drug use: No       OPHTHALMIC EXAM:  Not recorded    IMAGING AND PROCEDURES  Imaging and Procedures for 12/31/2022          ASSESSMENT/PLAN:  No diagnosis found.  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, right eye    - s/p IVA OD #1 (04.03.23), #2 (05.08.23), #3 (06.05.23), #4 (07.11.23), #5 (08.22.23)  - s/p IVE OD #1 (09.23.23), #2 (10.11.23), #3 (12.06.23)  - at initial visit, pt reported 1-2 mo history of central "spot" in vision OD  - FA (04.03.23) shows +CNV with mild late leakage IT fovea, no obvious smokestack or smoking dot  - BCVA stable at 20/40 decreased from 20/30   - OCT shows: Interval improvement/resolution of shallow SRF overlying stable low PED temporal fovea; +ORA 5 weeks  **interval increase in SRF on Avastin noted on OCT at 6 weeks on 08.22.23**  - pt reports high stress and +steroid use -- ?CSR component  - recommend IVE OD #4 today, 01.17.24- with follow up in 6 weeks.  - pt wishes to be treated with IVE OD  - RBA of procedure discussed,  questions answered - IVE OD informed consent obtained and signed on 09.29.23 - IVA OD informed consent obtained and signed on 04.03.23 - see procedure note   - f/u in 6 wks, DFE, OCT, possible injection  2. Age related macular degeneration, non-exudative, left eye  - The incidence, anatomy, and pathology of dry AMD, risk of progression, and the AREDS and AREDS 2 study including smoking risks discussed with patient.  - Recommend amsler grid monitoring  - f/u in 6 weeks DFE, OCT  3,4. Hypertensive retinopathy OU - discussed  importance of tight BP control - cont to monitor  5. Pseudophakia OU  - s/p CE/IOL (Dr. Elliot Dally)  - IOL in good position, doing well  - continue to monitor  Ophthalmic Meds Ordered this visit:  No orders of the defined types were placed in this encounter.    No follow-ups on file.  There are no Patient Instructions on file for this visit.  Explained the diagnoses, plan, and follow up with the patient and they expressed understanding.  Patient expressed understanding of the importance of proper follow up care.   This document serves as a record of services personally performed by Gardiner Sleeper, MD, PhD. It was created on their behalf by Orvan Falconer, an ophthalmic technician. The creation of this record is the provider's dictation and/or activities during the visit.    Electronically signed by: Orvan Falconer, OA, 12/25/22  1:16 PM     Gardiner Sleeper, M.D., Ph.D. Diseases & Surgery of the Retina and Vitreous [facility]: M myopia (nearsighted); A astigmatism; H hyperopia (farsighted); P presbyopia; Mrx spectacle prescription;  CTL contact lenses; OD right eye; OS left eye; OU both eyes  XT exotropia; ET esotropia; PEK punctate epithelial keratitis; PEE punctate epithelial erosions; DES dry eye syndrome; MGD meibomian gland dysfunction; ATs artificial tears; PFAT's preservative free artificial tears; [place]  nuclear sclerotic cataract; PSC posterior subcapsular cataract; ERM epi-retinal membrane; PVD posterior vitreous detachment; RD retinal detachment; DM diabetes mellitus; DR diabetic retinopathy; NPDR non-proliferative diabetic retinopathy; PDR proliferative diabetic retinopathy; CSME clinically significant macular edema; DME diabetic macular edema; dbh dot blot hemorrhages; CWS cotton wool spot; POAG primary open angle glaucoma; C/D cup-to-disc ratio; HVF humphrey visual field; GVF goldmann visual field; OCT optical coherence tomography; IOP intraocular pressure; BRVO Branch retinal vein occlusion; CRVO central retinal vein occlusion; CRAO central retinal artery occlusion; BRAO branch retinal artery occlusion; RT retinal tear; SB scleral buckle; PPV pars plana vitrectomy; VH Vitreous hemorrhage; PRP panretinal laser photocoagulation; IVK intravitreal kenalog; VMT vitreomacular traction; MH Macular hole;  NVD neovascularization of the disc; NVE neovascularization elsewhere; AREDS age related eye disease study; ARMD age related macular degeneration; POAG primary open angle glaucoma; EBMD epithelial/anterior basement membrane dystrophy; ACIOL anterior chamber intraocular lens; IOL intraocular lens; PCIOL posterior chamber intraocular lens; Phaco/IOL phacoemulsification with intraocular lens placement; [place] photorefractive keratectomy; LASIK laser assisted in situ keratomileusis; HTN hypertension; DM diabetes mellitus; COPD chronic obstructive pulmonary disease

## 2022-12-31 ENCOUNTER — Encounter (INDEPENDENT_AMBULATORY_CARE_PROVIDER_SITE_OTHER): Payer: Self-pay

## 2022-12-31 ENCOUNTER — Encounter (INDEPENDENT_AMBULATORY_CARE_PROVIDER_SITE_OTHER): Payer: Medicare HMO | Admitting: Ophthalmology

## 2022-12-31 DIAGNOSIS — H353122 Nonexudative age-related macular degeneration, left eye, intermediate dry stage: Secondary | ICD-10-CM

## 2022-12-31 DIAGNOSIS — H35033 Hypertensive retinopathy, bilateral: Secondary | ICD-10-CM

## 2022-12-31 DIAGNOSIS — I10 Essential (primary) hypertension: Secondary | ICD-10-CM

## 2022-12-31 DIAGNOSIS — Z961 Presence of intraocular lens: Secondary | ICD-10-CM

## 2022-12-31 DIAGNOSIS — H353211 Exudative age-related macular degeneration, right eye, with active choroidal neovascularization: Secondary | ICD-10-CM

## 2022-12-31 NOTE — Progress Notes (Signed)
[facility] Note  01/02/2023    CHIEF COMPLAINT Patient presents for Retina Follow Up   HISTORY OF PRESENT ILLNESS: [name] is a 82 y.o. female who presents to the clinic today for:   HPI     Retina Follow Up   Patient presents with  Wet AMD.  In right eye.  This started 6 weeks ago.  I, the attending physician,  performed the HPI with the patient and updated documentation appropriately.        Comments   Patient here for 6 weeks retina follow up for exu ARMD OD. Patient states vision not good. Not like it should be. No eye pain. Uses no drops.       Last edited by Bernarda Caffey, MD on 01/02/2023  1:03 PM.    Patient feels that the fluid has come back in the right eye.   Referring physician: Kristen Loader, FNP [address],  [place] [address]  HISTORICAL INFORMATION:   Selected notes from the MEDICAL RECORD NUMBER Referred by Dr. Lucianne Lei for ex ARMD LEE:  Ocular Hx- PMH-    CURRENT MEDICATIONS: Current Outpatient Medications (Ophthalmic Drugs)  Medication Sig   cycloSPORINE (RESTASIS) 0.05 % ophthalmic emulsion Restasis 0.05 % eye drops in a dropperette   No current facility-administered medications for this visit. (Ophthalmic Drugs)   Current Outpatient Medications (Other)  Medication Sig   Bilberry, Vaccinium myrtillus, (BILBERRY PO) Take 1 capsule by mouth daily.   cephALEXin (KEFLEX) 500 MG capsule Take 1 capsule (500 mg total) by mouth 3 (three) times daily.   Cholecalciferol (D3-1000 PO) Take by mouth.   fluticasone (FLONASE) 50 MCG/ACT nasal spray Place 2 sprays into both nostrils daily.   ibuprofen (ADVIL) 800 MG tablet Take 1 tablet (800 mg total) by mouth every 8 (eight) hours as needed.   metoprolol succinate (TOPROL-XL) 50 MG 24 hr tablet daily. Takes for headaches   Multiple Vitamins-Minerals (ICAPS AREDS 2 PO) Take by mouth daily.   Multiple Vitamins-Minerals (ICAPS LUTEIN & ZEAXANTHIN PO) Take by  mouth.   omeprazole (PRILOSEC) 40 MG capsule Take 1 capsule by mouth daily.   rosuvastatin (CRESTOR) 10 MG tablet    venlafaxine XR (EFFEXOR-XR) 75 MG 24 hr capsule TAKE ONE CAPSULE BY MOUTH ONCE DAILY WITH  BREAKFAST   amoxicillin-clavulanate (AUGMENTIN) 875-125 MG tablet Take 1 tablet by mouth every 12 (twelve) hours. (Patient not taking: Reported on 08/19/2022)   azelastine (ASTELIN) 0.1 % nasal spray USE TWO SPRAYS IN EACH NOSTRIL TWICE DAILY AS DIRECTED (Patient not taking: Reported on 10/15/2022)   conjugated estrogens (PREMARIN) vaginal cream Uses 14 days a month   Loperamide HCl (IMODIUM PO) Take by mouth. PRN   methocarbamol (ROBAXIN) 500 MG tablet Take 1 tablet (500 mg total) by mouth every 8 (eight) hours as needed for muscle spasms. Take mostly at bedtime   mometasone (ELOCON) 0.1 % cream 1 application   ondansetron (ZOFRAN) 4 MG tablet Takes PRN   terbinafine (LAMISIL) 250 MG tablet Take 1 tablet (250 mg total) by mouth daily.   triamcinolone cream (KENALOG) 0.1 %    No current facility-administered medications for this visit. (Other)   REVIEW OF SYSTEMS: ROS   Positive for: Eyes, Psychiatric Negative for: Constitutional, Gastrointestinal, Neurological, Skin, Genitourinary, Musculoskeletal, HENT, Endocrine, Cardiovascular, Respiratory, Allergic/Imm, Heme/Lymph Last edited by Theodore Demark, COA on 01/02/2023  8:33 AM.     ALLERGIES Allergies  Allergen Reactions   Levaquin [  Levofloxacin In D5w] Nausea Only    Dizziness and tremulousness   PAST MEDICAL HISTORY Past Medical History:  Diagnosis Date   Actinic keratosis 12/03/2012   Allergy    Anxiety    Effexor helps--psychiatrist Dr Toy Care   Arthritis    osteoarthritis, hands   Atypical chest pain    EF 86%, breast attenuation, no significant ischemia   Cataract    Cervical radiculopathy 11/18/2013   Chronic headache    takes Metoprolol for this   Diverticulosis    Erosive esophagitis    GERD (gastroesophageal  reflux disease) 11/2009   EGD: Dr. 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   Past Surgical History:  Procedure Laterality Date   AUGMENTATION MAMMAPLASTY Bilateral    silicone gel implants   Birth mark removed     CATARACT EXTRACTION Bilateral    COLONOSCOPY     COLPOSCOPY     laser vaporization of upper vagina  1998   NM MYOCAR PERF WALL MOTION  01/24/2008   EF 86% neg ischemia   VAGINAL HYSTERECTOMY  1978   FAMILY HISTORY Family History  Problem Relation Age of Onset   Heart disease Mother    Heart failure Mother    Breast cancer Mother 27   Prostate cancer Father    Inflammatory bowel disease Sister    Diabetes Maternal Aunt    Colon cancer Paternal Grandfather    Stomach cancer Neg Hx    Pancreatic cancer Neg Hx    Esophageal cancer Neg Hx    Rectal cancer Neg Hx    SOCIAL HISTORY Social History   Tobacco Use   Smoking status: Former    Packs/day: 1.00    Years: 10.00    Total pack years: 10.00    Types: Cigarettes    Quit date: 12/15/1988    Years since quitting: 34.0   Smokeless tobacco: Never   Tobacco comments:    quit in the 1980's  Vaping Use   Vaping Use: Never used  Substance Use Topics   Alcohol use: Yes    Alcohol/week: 14.0 standard drinks of alcohol    Types: 14 Standard drinks or equivalent per week    Comment: drinks wine every day; 2 drinks a day    Drug use: No       OPHTHALMIC EXAM:  Base Eye Exam     Visual Acuity (Snellen - Linear)       Right Left   Dist [place] 20/40 20/30 -2   Dist ph [place] 20/30 -1          Tonometry (Tonopen, 8:30 AM)       Right Left   Pressure 17 16          Pupils       Dark Light Shape React APD   Right 4 3 Round Brisk None   Left 4 3 Round Brisk None         Visual Fields (Counting fingers)       Left Right    Full Full         Extraocular Movement  Right Left    Full, Ortho Full, Ortho         Neuro/Psych     Oriented x3: Yes   Mood/Affect: Normal         Dilation     Both eyes: 1.0% Mydriacyl, 2.5% Phenylephrine @ 8:30 AM           Slit Lamp and Fundus Exam     Slit Lamp Exam       Right Left   Lids/Lashes Dermatochalasis - upper lid, mild MGD Dermatochalasis - upper lid, mild MGD   Conjunctiva/Sclera nasal pingeucula White and quiet   Cornea arcus, trace PEE, well healed cataract wound arcus, trace PEE, well healed cataract wound   Anterior Chamber deep, clear, narrow temporal angle Deep and quiet   Iris Round and dilated Round and dilated   Lens PC IOL in good position, trace Posterior capsular opacification PC IOL in good position with open PC   Anterior Vitreous syneresis, Posterior vitreous detachment, vitreous condensations syneresis, Posterior vitreous detachment         Fundus Exam       Right Left   Disc Pink and Sharp Pink and Sharp, +PPP ST rim   C/D Ratio 0.2 0.2   Macula Blunted foveal reflex, central edema / shallow SRF -- increased, no heme, +drusen, RPE mottling Blunted foveal reflex, Drusen, RPE mottling and clumping, No heme or edema   Vessels attenuated, Tortuous mild attenuation, mild tortuosity   Periphery Attached, mild midzonal drusen, no heme Attached, mild midzonal drusen, no heme           Refraction     Wearing Rx       Sphere Cylinder Axis Add   Right -0.50 +0.50 146 +2.50   Left -1.25 +1.25 167 +2.50           IMAGING AND PROCEDURES  Imaging and Procedures for 01/02/2023  OCT, Retina - OU - Both Eyes       Right Eye Quality was good. Central Foveal Thickness: 250. Progression has worsened. Findings include no IRF, no SRF, abnormal foveal  contour, retinal drusen , intraretinal hyper-reflective material, pigment epithelial detachment, outer retinal atrophy (Interval redevelopment of shallow SRF overlying stable low PED temporal fovea; +ORA).   Left Eye Quality was good. Central Foveal Thickness: 274. Progression has been stable. Findings include normal foveal contour, no IRF, no SRF, retinal drusen , outer retinal atrophy (Patchy ORA).   Notes *Images captured and stored on drive  Diagnosis / Impression:  OD: Interval redevelopment of shallow SRF overlying stable low PED temporal fovea; +ORA OS: non-exu ARMD, patchy ORA  Clinical management:  See below  Abbreviations: NFP - Normal foveal profile. CME - cystoid macular edema. PED - pigment epithelial detachment. IRF - intraretinal fluid. SRF - subretinal fluid. EZ - ellipsoid zone. ERM - epiretinal membrane. ORA - outer retinal atrophy. ORT - outer retinal tubulation. SRHM - subretinal hyper-reflective material. IRHM - intraretinal hyper-reflective material      Intravitreal Injection, Pharmacologic Agent - OD - Right Eye       Time Out 01/02/2023. 8:49 AM. Confirmed correct patient, procedure, site, and patient consented.   Anesthesia Topical anesthesia was used. Anesthetic medications included Lidocaine 2%, Proparacaine 0.5%.   Procedure Preparation included 5% betadine to ocular surface, eyelid speculum. A (32g) needle was used.   Injection: 2 mg aflibercept 2 MG/0.05ML   Route: Intravitreal, Site: Right Eye   NDC: [id number], Lot: [id number], Expiration date: 03/14/2024, Waste: 0 mL  Post-op Post injection exam found visual acuity of at least counting fingers. The patient tolerated the procedure well. There were no complications. The patient received written and verbal post procedure care education. Post injection medications were not given.   Notes An AC tap was performed following injection due to elevated IOP using a 30 gauge needle on a syringe with the  plunger removed. The needle was placed at the limbus at 7 oclock and approximately 0.08 cc of aqueous was removed from the anterior chamber. Betadine was applied to the tap area before and after the paracentesis was performed. There were no complications. The patient tolerated the procedure well. The IOP was rechecked and was found to be 9 mmHg by palpation.           ASSESSMENT/PLAN:    ICD-10-CM   1. Exudative age-related macular degeneration of right eye with active choroidal neovascularization (HCC)  H35.3211 OCT, Retina - OU - Both Eyes    Intravitreal Injection, Pharmacologic Agent - OD - Right Eye    aflibercept (EYLEA) SOLN 2 mg    2. Intermediate stage nonexudative age-related macular degeneration of left eye  H35.3122     3. Essential hypertension  I10     4. Hypertensive retinopathy of both eyes  H35.033     5. 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, right eye   - s/p IVA OD #1 (04.03.23), #2 (05.08.23), #3 (06.05.23), #4(07.11.23), #5 (08.22.23)  - s/p IVE OD #1 (09.23.23), #2 (10.11.23) #3 (11/19/2022)  - at initial visit, pt reported 1-2 mo history of central "spot" in vision OD - FA (04.03.23) shows +CNV with mild late leakage IT fovea, no obvious smokestack or smoking dot - BCVA stable 20/30 - OCT shows: Interval redevelopment of shallow SRF overlying stable low PED temporal fovea; +ORA 6 weeks  **interval increase in SRF on Avastin noted on OCT at 6 weeks on 08.22.23 on Eylea at 6 wks on 01.19.24**  - pt reports high stress and +steroid use -- ?CSR component  - recommend to take AREDS, biotin, and Lutein  - recommend IVE OD #4 today, 01.19.24- with follow up back to 5 weeks.  - pt wishes to be treated with IVE OD  - RBA of procedure discussed, questions answered - IVE OD informed consent obtained and signed on 09.29.23 - IVA OD informed consent obtained and signed on 04.03.23 - see procedure note   - f/u in 5 wks, DFE,  OCT, possible injection  2. Age related macular degeneration, non-exudative, left eye - The incidence, anatomy, and pathology of dry AMD, risk of progression, and the AREDS 2 study including smoking risks discussed with patient.  - Recommend amsler grid monitoring weekly  - f/u in 5 weeks DFE, OCT  3,4. Hypertensive retinopathy OU - discussed importance of tight BP control - continue to monitor  5. Pseudophakia OU  - s/p CE/IOL (Dr. Elliot Dally)  - IOL in good position, doing well  - continue to monitor  Ophthalmic Meds Ordered this visit:  Meds ordered this encounter  Medications   aflibercept (EYLEA) SOLN 2 mg     This document serves as a record of services personally performed by Gardiner Sleeper, MD, PhD. It was created on their behalf by Bernarda Caffey, MD, an ophthalmic technician. The creation of this record is the provider's dictation and/or activities during the visit.    Electronically signed by: Bernarda Caffey, MD 01.19.24 2:12 AM  Gardiner Sleeper, M.D., Ph.D. Diseases & Surgery of the Retina and [place] 01/02/2023   I have reviewed the above documentation for accuracy and completeness, and I agree with the above. Gardiner Sleeper, M.D., Ph.D. 01/03/23 2:14 AM   Abbreviations: M myopia (nearsighted); A astigmatism; H hyperopia (farsighted); P presbyopia; Mrx spectacle prescription;  CTL contact lenses; OD right eye; OS left eye; OU both eyes  XT exotropia; ET esotropia; PEK punctate epithelial keratitis; PEE punctate epithelial erosions; DES dry eye syndrome; MGD meibomian gland dysfunction; ATs artificial tears; PFAT's preservative free artificial tears; [place] nuclear sclerotic cataract; PSC posterior subcapsular cataract; ERM epi-retinal membrane; PVD posterior vitreous detachment; RD retinal detachment; DM diabetes mellitus; DR diabetic retinopathy; NPDR non-proliferative diabetic retinopathy; PDR proliferative diabetic retinopathy; CSME  clinically significant macular edema; DME diabetic macular edema; dbh dot blot hemorrhages; CWS cotton wool spot; POAG primary open angle glaucoma; C/D cup-to-disc ratio; HVF humphrey visual field; GVF goldmann visual field; OCT optical coherence tomography; IOP intraocular pressure; BRVO Branch retinal vein occlusion; CRVO central retinal vein occlusion; CRAO central retinal artery occlusion; BRAO branch retinal artery occlusion; RT retinal tear; SB scleral buckle; PPV pars plana vitrectomy; VH Vitreous hemorrhage; PRP panretinal laser photocoagulation; IVK intravitreal kenalog; VMT vitreomacular traction; MH Macular hole;  NVD neovascularization of the disc; NVE neovascularization elsewhere; AREDS age related eye disease study; ARMD age related macular degeneration; POAG primary open angle glaucoma; EBMD epithelial/anterior basement membrane dystrophy; ACIOL anterior chamber intraocular lens; IOL intraocular lens; PCIOL posterior chamber intraocular lens; Phaco/IOL phacoemulsification with intraocular lens placement; [place] photorefractive keratectomy; LASIK laser assisted in situ keratomileusis; HTN hypertension; DM diabetes mellitus; COPD chronic obstructive pulmonary disease

## 2023-01-02 ENCOUNTER — Encounter (INDEPENDENT_AMBULATORY_CARE_PROVIDER_SITE_OTHER): Payer: Self-pay | Admitting: Ophthalmology

## 2023-01-02 ENCOUNTER — Ambulatory Visit (INDEPENDENT_AMBULATORY_CARE_PROVIDER_SITE_OTHER): Payer: Medicare HMO | Admitting: Ophthalmology

## 2023-01-02 DIAGNOSIS — H353122 Nonexudative age-related macular degeneration, left eye, intermediate dry stage: Secondary | ICD-10-CM

## 2023-01-02 DIAGNOSIS — Z961 Presence of intraocular lens: Secondary | ICD-10-CM | POA: Diagnosis not present

## 2023-01-02 DIAGNOSIS — H35033 Hypertensive retinopathy, bilateral: Secondary | ICD-10-CM

## 2023-01-02 DIAGNOSIS — I10 Essential (primary) hypertension: Secondary | ICD-10-CM

## 2023-01-02 DIAGNOSIS — H353211 Exudative age-related macular degeneration, right eye, with active choroidal neovascularization: Secondary | ICD-10-CM | POA: Diagnosis not present

## 2023-01-02 MED ORDER — AFLIBERCEPT 2MG/0.05ML IZ SOLN FOR KALEIDOSCOPE
2.0000 mg | INTRAVITREAL | Status: AC | PRN
  Administered 2023-01-02: 2 mg via INTRAVITREAL

## 2023-01-21 DIAGNOSIS — R69 Illness, unspecified: Secondary | ICD-10-CM | POA: Diagnosis not present

## 2023-01-21 DIAGNOSIS — R112 Nausea with vomiting, unspecified: Secondary | ICD-10-CM | POA: Diagnosis not present

## 2023-02-04 NOTE — Progress Notes (Signed)
[facility] Note  02/05/2023    CHIEF COMPLAINT Patient presents for Retina Follow Up   HISTORY OF PRESENT ILLNESS: [name] is a 82 y.o. female who presents to the clinic today for:   HPI     Retina Follow Up   Patient presents with  Wet AMD.  In right eye.  This started months ago.  Duration of 5 weeks.  Since onset it is gradually worsening.  I, the attending physician,  performed the HPI with the patient and updated documentation appropriately.        Comments   Patient feels that the vision is very blurry all the time. The vision has a few waves in them and that is new. She is not using any eye drops at this time      Last edited by Bernarda Caffey, MD on 02/05/2023 11:22 AM.      Referring physician: Kristen Loader, [place],  [place] [address]  HISTORICAL INFORMATION:   Selected notes from the MEDICAL RECORD NUMBER Referred by Dr. Lucianne Lei for ex ARMD LEE:  Ocular Hx- PMH-    CURRENT MEDICATIONS: Current Outpatient Medications (Ophthalmic Drugs)  Medication Sig   cycloSPORINE (RESTASIS) 0.05 % ophthalmic emulsion Restasis 0.05 % eye drops in a dropperette   No current facility-administered medications for this visit. (Ophthalmic Drugs)   Current Outpatient Medications (Other)  Medication Sig   amoxicillin-clavulanate (AUGMENTIN) 875-125 MG tablet Take 1 tablet by mouth every 12 (twelve) hours. (Patient not taking: Reported on 08/19/2022)   azelastine (ASTELIN) 0.1 % nasal spray USE TWO SPRAYS IN EACH NOSTRIL TWICE DAILY AS DIRECTED (Patient not taking: Reported on 10/15/2022)   Bilberry, Vaccinium myrtillus, (BILBERRY PO) Take 1 capsule by mouth daily.   cephALEXin (KEFLEX) 500 MG capsule Take 1 capsule (500 mg total) by mouth 3 (three) times daily.   Cholecalciferol (D3-1000 PO) Take by mouth.   conjugated estrogens (PREMARIN) vaginal cream Uses 14 days a month   fluticasone (FLONASE) 50 MCG/ACT nasal spray  Place 2 sprays into both nostrils daily.   ibuprofen (ADVIL) 800 MG tablet Take 1 tablet (800 mg total) by mouth every 8 (eight) hours as needed.   Loperamide HCl (IMODIUM PO) Take by mouth. PRN   methocarbamol (ROBAXIN) 500 MG tablet Take 1 tablet (500 mg total) by mouth every 8 (eight) hours as needed for muscle spasms. Take mostly at bedtime   metoprolol succinate (TOPROL-XL) 50 MG 24 hr tablet daily. Takes for headaches   mometasone (ELOCON) 0.1 % cream 1 application   Multiple Vitamins-Minerals (ICAPS AREDS 2 PO) Take by mouth daily.   Multiple Vitamins-Minerals (ICAPS LUTEIN & ZEAXANTHIN PO) Take by mouth.   omeprazole (PRILOSEC) 40 MG capsule Take 1 capsule by mouth daily.   ondansetron (ZOFRAN) 4 MG tablet Takes PRN   rosuvastatin (CRESTOR) 10 MG tablet    terbinafine (LAMISIL) 250 MG tablet Take 1 tablet (250 mg total) by mouth daily.   triamcinolone cream (KENALOG) 0.1 %    venlafaxine XR (EFFEXOR-XR) 75 MG 24 hr capsule TAKE ONE CAPSULE BY MOUTH ONCE DAILY WITH  BREAKFAST   No current facility-administered medications for this visit. (Other)   REVIEW OF SYSTEMS: ROS   Positive for: Eyes, Psychiatric Negative for: Constitutional, Gastrointestinal, Neurological, Skin, Genitourinary, Musculoskeletal, HENT, Endocrine, Cardiovascular, Respiratory, Allergic/Imm, Heme/Lymph Last edited by Annie Paras, COT on 02/05/2023  9:36 AM.      ALLERGIES Allergies  Allergen Reactions  Levaquin [Levofloxacin In D5w] Nausea Only    Dizziness and tremulousness   PAST MEDICAL HISTORY Past Medical History:  Diagnosis Date   Actinic keratosis 12/03/2012   Allergy    Anxiety    Effexor helps--psychiatrist Dr Toy [facility]   Arthritis    osteoarthritis, hands   Atypical chest pain    EF 86%, breast attenuation, no significant ischemia   Cataract    Cervical radiculopathy 11/18/2013   Chronic headache    takes Metoprolol for this   Diverticulosis    Erosive esophagitis    GERD  (gastroesophageal reflux disease) 11/2009   EGD: Dr. 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   Past Surgical History:  Procedure Laterality Date   AUGMENTATION MAMMAPLASTY Bilateral    silicone gel implants   Birth mark removed     CATARACT EXTRACTION Bilateral    COLONOSCOPY     COLPOSCOPY     laser vaporization of upper vagina  1998   NM MYOCAR PERF WALL MOTION  01/24/2008   EF 86% neg ischemia   VAGINAL HYSTERECTOMY  1978   FAMILY HISTORY Family History  Problem Relation Age of Onset   Heart disease Mother    Heart failure Mother    Breast cancer Mother 15   Prostate cancer Father    Inflammatory bowel disease Sister    Diabetes Maternal Aunt    Colon cancer Paternal Grandfather    Stomach cancer Neg Hx    Pancreatic cancer Neg Hx    Esophageal cancer Neg Hx    Rectal cancer Neg Hx    SOCIAL HISTORY Social History   Tobacco Use   Smoking status: Former    Packs/day: 1.00    Years: 10.00    Total pack years: 10.00    Types: Cigarettes    Quit date: 12/15/1988    Years since quitting: 34.1   Smokeless tobacco: Never   Tobacco comments:    quit in the 1980's  Vaping Use   Vaping Use: Never used  Substance Use Topics   Alcohol use: Yes    Alcohol/week: 14.0 standard drinks of alcohol    Types: 14 Standard drinks or equivalent per week    Comment: drinks wine every day; 2 drinks a day    Drug use: No       OPHTHALMIC EXAM:  Base Eye Exam     Visual Acuity (Snellen - Linear)       Right Left   Dist [place] 20/40 20/50   Dist ph Page 20/30 20/40         Tonometry (Tonopen, 9:40 AM)       Right Left   Pressure  13 14         Pupils       Dark Light Shape React APD   Right 4 3 Round Brisk None   Left 4 3 Round Brisk None         Visual Fields       Left Right    Full Full         Extraocular Movement  Right Left    Full, Ortho Full, Ortho         Neuro/Psych     Oriented x3: Yes   Mood/Affect: Normal         Dilation     Both eyes: 2.5% Phenylephrine, 1.0% Mydriacyl @ 9:36 AM           Slit Lamp and Fundus Exam     Slit Lamp Exam       Right Left   Lids/Lashes Dermatochalasis - upper lid, mild MGD Dermatochalasis - upper lid, mild MGD   Conjunctiva/Sclera nasal pingeucula White and quiet   Cornea arcus, trace PEE, well healed cataract wound arcus, 1+PEE, well healed cataract wound, mild tear film debris   Anterior Chamber deep, clear, narrow temporal angle Deep and quiet   Iris Round and dilated Round and dilated   Lens PC IOL in good position, trace Posterior capsular opacification PC IOL in good position with open PC   Anterior Vitreous syneresis, Posterior vitreous detachment, vitreous condensations syneresis, Posterior vitreous detachment         Fundus Exam       Right Left   Disc Pink and Sharp, +SVP Pink and Sharp, +PPP ST rim   C/D Ratio 0.2 0.2   Macula Blunted foveal reflex, central edema / shallow SRF -- improved no heme, +drusen, RPE mottling Blunted foveal reflex, Drusen, RPE mottling and clumping, No heme or edema   Vessels attenuated, mild AV crossing changes mild attenuation, mild tortuosity   Periphery Attached, mild midzonal drusen, no heme Attached, mild midzonal drusen, no heme           Refraction     Wearing Rx       Sphere Cylinder Axis Add   Right -0.50 +0.50 146 +2.50   Left -1.25 +1.25 167 +2.50           IMAGING AND PROCEDURES  Imaging and Procedures for 02/05/2023  OCT, Retina - OU - Both Eyes       Right Eye Quality was good. Central Foveal Thickness: 222. Progression has improved. Findings  include no IRF, no SRF, abnormal foveal contour, retinal drusen , intraretinal hyper-reflective material, pigment epithelial detachment, outer retinal atrophy (Interval improvement in central SRF overlying stable low PED temporal fovea; +ORA).   Left Eye Quality was good. Central Foveal Thickness: 270. Progression has been stable. Findings include normal foveal contour, no IRF, no SRF, retinal drusen , outer retinal atrophy (Patchy ORA).   Notes *Images captured and stored on drive  Diagnosis / Impression:  OD: Interval improvement in central SRF overlying stable low PED temporal fovea; +ORA OS: non-exu ARMD, patchy ORA  Clinical management:  See below  Abbreviations: NFP - Normal foveal profile. CME - cystoid macular edema. PED - pigment epithelial detachment. IRF - intraretinal fluid. SRF - subretinal fluid. EZ - ellipsoid zone. ERM - epiretinal membrane. ORA - outer retinal atrophy. ORT - outer retinal tubulation. SRHM - subretinal hyper-reflective material. IRHM - intraretinal hyper-reflective material      Intravitreal Injection, Pharmacologic Agent - OD - Right Eye       Time Out 02/05/2023. 10:23 AM. Confirmed correct patient, procedure, site, and patient consented.   Anesthesia Topical anesthesia was used. Anesthetic medications included Lidocaine 2%, Proparacaine 0.5%.   Procedure Preparation included 5% betadine to ocular surface, eyelid speculum. A (32g) needle was used.   Injection: 2 mg aflibercept 2 MG/0.05ML   Route: Intravitreal, Site: Right Eye   NDC: [id number], Lot:  [id number], Expiration date: 11/14/2023, Waste: 0 mL   Post-op Post injection exam found visual acuity of at least counting fingers. The patient tolerated the procedure well. There were no complications. The patient received written and verbal post procedure care education. Post injection medications were not given.            ASSESSMENT/PLAN:    ICD-10-CM   1. Exudative age-related  macular degeneration of right eye with active choroidal neovascularization (HCC)  H35.3211 OCT, Retina - OU - Both Eyes    Intravitreal Injection, Pharmacologic Agent - OD - Right Eye    aflibercept (EYLEA) SOLN 2 mg    2. Intermediate stage nonexudative age-related macular degeneration of left eye  H35.3122     3. Essential hypertension  I10     4. Hypertensive retinopathy of both eyes  H35.033     5. 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, right eye   - s/p IVA OD #1 (04.03.23), #2 (05.08.23), #3 (06.05.23), #4(07.11.23), #5 (08.22.23)  - s/p IVE OD #1 (09.23.23), #2 (10.11.23) #3 (12.06.23), #4 (01.19.24)  - at initial visit, pt reported 1-2 mo history of central "spot" in vision OD - FA (04.03.23) shows +CNV with mild late leakage IT fovea, no obvious smokestack or expansile dot **interval increase in SRF on Avastin noted on OCT at 6 weeks on 08.22.23 on Eylea at 6 wks on 01.19.24** - BCVA stable 20/30 - OCT shows Interval improvement in central SRF overlying stable low PED temporal fovea, +ORA at 6 weeks  - pt reports high stress and +steroid use -- ?CSR component  - recommend to take AREDS, biotin, and Lutein  - recommend IVE OD #5 today, 02.22.24 - with follow up in 5 weeks again  - pt wishes to be treated with IVE OD  - RBA of procedure discussed, questions answered - IVE OD informed consent obtained and signed on 09.29.23 - IVA OD informed consent obtained and signed on 04.03.23 - see procedure note   - f/u in 5 wks, DFE, OCT, possible injection  2. Age related macular degeneration, non-exudative, left eye - The incidence, anatomy, and pathology of dry AMD, risk of progression, and the AREDS 2 study including smoking risks discussed with patient.  - Recommend amsler grid monitoring weekly  - f/u in 5 weeks DFE, OCT  3,4. Hypertensive retinopathy OU - discussed importance of tight BP control - continue to monitor  5. Pseudophakia  OU  - s/p CE/IOL (Dr. Elliot Dally)  - IOL in good position, doing well  - continue to monitor  Ophthalmic Meds Ordered this visit:  Meds ordered this encounter  Medications   aflibercept (EYLEA) SOLN 2 mg     This document serves as a record of services personally performed by Gardiner Sleeper, MD, PhD. It was created on their behalf by San Jetty. Owens Shark, OA an ophthalmic technician. The creation of this record is the provider's dictation and/or activities during the visit.    Electronically signed by: San Jetty. Owens Shark, [place] 02.21.2024 11:23 AM   Gardiner Sleeper, M.D., Ph.D. Diseases & Surgery of the Retina and Vitreous [facility]  I have reviewed the above documentation for accuracy and completeness, and I agree with the above. Gardiner Sleeper, M.D., Ph.D. 02/05/23 11:25 AM   Abbreviations: M myopia (nearsighted); A astigmatism; H hyperopia (farsighted); P presbyopia; Mrx spectacle prescription;  CTL contact lenses; OD right eye; OS left  eye; OU both eyes  XT exotropia; ET esotropia; PEK punctate epithelial keratitis; PEE punctate epithelial erosions; DES dry eye syndrome; MGD meibomian gland dysfunction; ATs artificial tears; PFAT's preservative free artificial tears; [place] nuclear sclerotic cataract; PSC posterior subcapsular cataract; ERM epi-retinal membrane; PVD posterior vitreous detachment; RD retinal detachment; DM diabetes mellitus; DR diabetic retinopathy; NPDR non-proliferative diabetic retinopathy; PDR proliferative diabetic retinopathy; CSME clinically significant macular edema; DME diabetic macular edema; dbh dot blot hemorrhages; CWS cotton wool spot; POAG primary open angle glaucoma; C/D cup-to-disc ratio; HVF humphrey visual field; GVF goldmann visual field; OCT optical coherence tomography; IOP intraocular pressure; BRVO Branch retinal vein occlusion; CRVO central retinal vein occlusion; CRAO central retinal artery occlusion; BRAO branch retinal artery  occlusion; RT retinal tear; SB scleral buckle; PPV pars plana vitrectomy; VH Vitreous hemorrhage; PRP panretinal laser photocoagulation; IVK intravitreal kenalog; VMT vitreomacular traction; MH Macular hole;  NVD neovascularization of the disc; NVE neovascularization elsewhere; AREDS age related eye disease study; ARMD age related macular degeneration; POAG primary open angle glaucoma; EBMD epithelial/anterior basement membrane dystrophy; ACIOL anterior chamber intraocular lens; IOL intraocular lens; PCIOL posterior chamber intraocular lens; Phaco/IOL phacoemulsification with intraocular lens placement; [place] photorefractive keratectomy; LASIK laser assisted in situ keratomileusis; HTN hypertension; DM diabetes mellitus; COPD chronic obstructive pulmonary disease

## 2023-02-05 ENCOUNTER — Ambulatory Visit (INDEPENDENT_AMBULATORY_CARE_PROVIDER_SITE_OTHER): Payer: Medicare HMO | Admitting: Ophthalmology

## 2023-02-05 ENCOUNTER — Encounter (INDEPENDENT_AMBULATORY_CARE_PROVIDER_SITE_OTHER): Payer: Self-pay | Admitting: Ophthalmology

## 2023-02-05 DIAGNOSIS — H353122 Nonexudative age-related macular degeneration, left eye, intermediate dry stage: Secondary | ICD-10-CM

## 2023-02-05 DIAGNOSIS — I10 Essential (primary) hypertension: Secondary | ICD-10-CM | POA: Diagnosis not present

## 2023-02-05 DIAGNOSIS — Z961 Presence of intraocular lens: Secondary | ICD-10-CM | POA: Diagnosis not present

## 2023-02-05 DIAGNOSIS — H353211 Exudative age-related macular degeneration, right eye, with active choroidal neovascularization: Secondary | ICD-10-CM | POA: Diagnosis not present

## 2023-02-05 DIAGNOSIS — H35033 Hypertensive retinopathy, bilateral: Secondary | ICD-10-CM | POA: Diagnosis not present

## 2023-02-05 MED ORDER — AFLIBERCEPT 2MG/0.05ML IZ SOLN FOR KALEIDOSCOPE
2.0000 mg | INTRAVITREAL | Status: AC | PRN
  Administered 2023-02-05: 2 mg via INTRAVITREAL

## 2023-02-06 ENCOUNTER — Encounter (INDEPENDENT_AMBULATORY_CARE_PROVIDER_SITE_OTHER): Payer: Medicare HMO | Admitting: Ophthalmology

## 2023-02-17 ENCOUNTER — Other Ambulatory Visit (INDEPENDENT_AMBULATORY_CARE_PROVIDER_SITE_OTHER): Payer: Self-pay

## 2023-02-26 NOTE — Progress Notes (Signed)
[facility] Note  03/12/2023    CHIEF COMPLAINT Patient presents for Retina Follow Up   HISTORY OF PRESENT ILLNESS: [name] is a 82 y.o. female who presents to the clinic today for:   HPI     Retina Follow Up   Patient presents with  Wet AMD.  In right eye.  Severity is moderate.  Duration of 5 weeks.  Since onset it is gradually worsening.  I, the attending physician,  performed the HPI with the patient and updated documentation appropriately.        Comments   Pt here for 5 wk ret f/u for exu ARMD OD. Pt states VA has decreased in general over last five weeks. She feels she's not seeing intermediate distance as well. Pt suffering from allergies, watering eyes OU.       Last edited by Bernarda Caffey, MD on 03/12/2023 11:52 AM.    Pt states vision is good   Referring physician: Kristen Loader, [place],  [place] [address]  HISTORICAL INFORMATION:   Selected notes from the MEDICAL RECORD NUMBER Referred by Dr. Lucianne Lei for ex ARMD LEE:  Ocular Hx- PMH-    CURRENT MEDICATIONS: Current Outpatient Medications (Ophthalmic Drugs)  Medication Sig   cycloSPORINE (RESTASIS) 0.05 % ophthalmic emulsion Restasis 0.05 % eye drops in a dropperette   No current facility-administered medications for this visit. (Ophthalmic Drugs)   Current Outpatient Medications (Other)  Medication Sig   acetaminophen (TYLENOL) 500 MG tablet Take 1 tablet (500 mg total) by mouth every 8 (eight) hours as needed.   Bilberry, Vaccinium myrtillus, (BILBERRY PO) Take 1 capsule by mouth daily.   Cholecalciferol (D3-1000 PO) Take by mouth.   fluticasone (FLONASE) 50 MCG/ACT nasal spray Place 2 sprays into both nostrils daily.   ibuprofen (ADVIL) 400 MG tablet Take 1 tablet (400 mg total) by mouth every 8 (eight) hours as needed.   metoprolol succinate (TOPROL-XL) 50 MG 24 hr tablet daily. Takes for headaches   Multiple Vitamins-Minerals (ICAPS AREDS 2  PO) Take by mouth daily.   Multiple Vitamins-Minerals (ICAPS LUTEIN & ZEAXANTHIN PO) Take by mouth.   omeprazole (PRILOSEC) 40 MG capsule Take 1 capsule by mouth daily.   rosuvastatin (CRESTOR) 10 MG tablet    venlafaxine XR (EFFEXOR-XR) 75 MG 24 hr capsule TAKE ONE CAPSULE BY MOUTH ONCE DAILY WITH  BREAKFAST   amoxicillin-clavulanate (AUGMENTIN) 875-125 MG tablet Take 1 tablet by mouth every 12 (twelve) hours. (Patient not taking: Reported on 08/19/2022)   azelastine (ASTELIN) 0.1 % nasal spray USE TWO SPRAYS IN EACH NOSTRIL TWICE DAILY AS DIRECTED (Patient not taking: Reported on 10/15/2022)   cephALEXin (KEFLEX) 500 MG capsule Take 1 capsule (500 mg total) by mouth 3 (three) times daily.   conjugated estrogens (PREMARIN) vaginal cream Uses 14 days a month   Loperamide HCl (IMODIUM PO) Take by mouth. PRN   mometasone (ELOCON) 0.1 % cream 1 application   ondansetron (ZOFRAN) 4 MG tablet Takes PRN   terbinafine (LAMISIL) 250 MG tablet Take 1 tablet (250 mg total) by mouth daily.   triamcinolone cream (KENALOG) 0.1 %    No current facility-administered medications for this visit. (Other)   REVIEW OF SYSTEMS: ROS   Positive for: Eyes, Psychiatric Negative for: Constitutional, Gastrointestinal, Neurological, Skin, Genitourinary, Musculoskeletal, HENT, Endocrine, Cardiovascular, Respiratory, Allergic/Imm, Heme/Lymph Last edited by Kingsley Spittle, COT on 03/12/2023  7:57 AM.       ALLERGIES  Allergies  Allergen Reactions   Levaquin [Levofloxacin In D5w] Nausea Only    Dizziness and tremulousness   PAST MEDICAL HISTORY Past Medical History:  Diagnosis Date   Actinic keratosis 12/03/2012   Allergy    Anxiety    Effexor helps--psychiatrist Dr [facility]   Arthritis    osteoarthritis, hands   Atypical chest pain    EF 86%, breast attenuation, no significant ischemia   Cataract    Cervical radiculopathy 11/18/2013   Chronic headache    takes Metoprolol for this   Diverticulosis     Erosive esophagitis    GERD (gastroesophageal reflux disease) 11/2009   EGD: Dr. 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   Past Surgical History:  Procedure Laterality Date   AUGMENTATION MAMMAPLASTY Bilateral    silicone gel implants   Birth mark removed     CATARACT EXTRACTION Bilateral    COLONOSCOPY     COLPOSCOPY     laser vaporization of upper vagina  1998   NM MYOCAR PERF WALL MOTION  01/24/2008   EF 86% neg ischemia   VAGINAL HYSTERECTOMY  1978   FAMILY HISTORY Family History  Problem Relation Age of Onset   Heart disease Mother    Heart failure Mother    Breast cancer Mother 7   Prostate cancer Father    Inflammatory bowel disease Sister    Diabetes Maternal Aunt    Colon cancer Paternal Grandfather    Stomach cancer Neg Hx    Pancreatic cancer Neg Hx    Esophageal cancer Neg Hx    Rectal cancer Neg Hx    SOCIAL HISTORY Social History   Tobacco Use   Smoking status: Former    Packs/day: 1.00    Years: 10.00    Additional pack years: 0.00    Total pack years: 10.00    Types: Cigarettes    Quit date: 12/15/1988    Years since quitting: 34.2   Smokeless tobacco: Never   Tobacco comments:    quit in the 1980's  Vaping Use   Vaping Use: Never used  Substance Use Topics   Alcohol use: Yes    Alcohol/week: 14.0 standard drinks of alcohol    Types: 14 Standard drinks or equivalent per week    Comment: drinks wine every day; 2 drinks a day    Drug use: No       OPHTHALMIC EXAM:  Base Eye Exam     Visual Acuity (Snellen - Linear)       Right Left   Dist [place] 20/50 -2 20/50 -2   Dist ph [place] 20/30 +2 20/40 -1          Tonometry (Tonopen, 8:03 AM)       Right Left   Pressure 14 11         Pupils       Pupils Dark Light Shape React APD   Right PERRL 4 3 Round Brisk None   Left PERRL 4 3 Round Brisk None         Visual Fields (Counting fingers)  Left Right    Full Full         Extraocular Movement       Right Left    Full, Ortho Full, Ortho         Neuro/Psych     Oriented x3: Yes   Mood/Affect: Normal         Dilation     Both eyes: 1.0% Mydriacyl, 2.5% Phenylephrine @ 8:04 AM           Slit Lamp and Fundus Exam     Slit Lamp Exam       Right Left   Lids/Lashes Dermatochalasis - upper lid, mild MGD Dermatochalasis - upper lid, mild MGD   Conjunctiva/Sclera nasal pingeucula White and quiet   Cornea arcus, trace PEE, well healed cataract wound arcus, 1+PEE, well healed cataract wound, mild tear film debris   Anterior Chamber deep, clear, narrow temporal angle Deep and quiet   Iris Round and dilated Round and dilated   Lens PC IOL in good position, trace Posterior capsular opacification PC IOL in good position with open PC   Anterior Vitreous syneresis, Posterior vitreous detachment, vitreous condensations syneresis, Posterior vitreous detachment         Fundus Exam       Right Left   Disc Pink and Sharp, +SVP Pink and Sharp, +PPP ST rim   C/D Ratio 0.2 0.2   Macula Blunted foveal reflex, central edema / shallow SRF -- improved no heme, +drusen, RPE mottling Blunted foveal reflex, Drusen, RPE mottling and clumping, No heme or edema   Vessels attenuated, mild AV crossing changes mild attenuation, mild tortuosity   Periphery Attached, mild midzonal drusen, no heme Attached, mild midzonal drusen, no heme           IMAGING AND PROCEDURES  Imaging and Procedures for 03/12/2023  OCT, Retina - OU - Both Eyes       Right Eye Quality was good. Central Foveal Thickness: 223. Progression has been stable. Findings include no IRF, no SRF,  abnormal foveal contour, retinal drusen , intraretinal hyper-reflective material, pigment epithelial detachment, outer retinal atrophy (stable improvement in central SRF overlying stable low PED temporal fovea; +ORA).   Left Eye Quality was good. Central Foveal Thickness: 273. Progression has been stable. Findings include normal foveal contour, no IRF, no SRF, retinal drusen , outer retinal atrophy (Patchy ORA).   Notes *Images captured and stored on drive  Diagnosis / Impression:  OD: stable improvement in central SRF overlying stable low PED temporal fovea; +ORA OS: non-exu ARMD, patchy ORA  Clinical management:  See below  Abbreviations: NFP - Normal foveal profile. CME - cystoid macular edema. PED - pigment epithelial detachment. IRF - intraretinal fluid. SRF - subretinal fluid. EZ - ellipsoid zone. ERM - epiretinal membrane. ORA - outer retinal atrophy. ORT - outer retinal tubulation. SRHM - subretinal hyper-reflective material. IRHM - intraretinal hyper-reflective material      Intravitreal Injection, Pharmacologic Agent - OD - Right Eye       Time Out 03/12/2023. 8:42 AM. Confirmed correct patient, procedure, site, and patient consented.   Anesthesia Topical anesthesia was used. Anesthetic medications included Lidocaine 2%, Proparacaine 0.5%.   Procedure Preparation included 5% betadine to ocular surface, eyelid speculum. A (32g) needle was used.   Injection: 2 mg aflibercept 2 MG/0.05ML   Route: Intravitreal, Site: Right Eye   NDC: [id number], Lot: [id number], Expiration date: 10/15/2023, Waste: 0 mL   Post-op Post injection exam found visual acuity of  at least counting fingers. The patient tolerated the procedure well. There were no complications. The patient received written and verbal post procedure care education. Post injection medications were not given.             ASSESSMENT/PLAN:    ICD-10-CM   1. Exudative age-related macular degeneration of right  eye with active choroidal neovascularization (HCC)  H35.3211 OCT, Retina - OU - Both Eyes    Intravitreal Injection, Pharmacologic Agent - OD - Right Eye    aflibercept (EYLEA) SOLN 2 mg    2. Intermediate stage nonexudative age-related macular degeneration of left eye  H35.3122     3. Essential hypertension  I10     4. Hypertensive retinopathy of both eyes  H35.033     5. 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, right eye   - s/p IVA OD #1 (04.03.23), #2 (05.08.23), #3 (06.05.23), #4(07.11.23), #5 (08.22.23)  - s/p IVE OD #1 (09.23.23), #2 (10.11.23) #3 (12.06.23), #4 (01.19.24), #5 (02.22.24)  - at initial visit, pt reported 1-2 mo history of central "spot" in vision OD - FA (04.03.23) shows +CNV with mild late leakage IT fovea, no obvious smokestack or expansile dot **interval increase in SRF on Avastin noted on OCT at 6 weeks on 08.22.23 on Eylea at 6 wks on 01.19.24** - BCVA stable 20/30 - OCT shows stable improvement in central SRF overlying stable low PED temporal fovea, +ORA at 5 weeks  - pt reports high stress and +steroid use -- ?CSR component  - recommend to take AREDS, biotin, and Lutein  - recommend IVE OD #6 today, 03.15.24 - with follow up ext to 6 weeks  - pt wishes to be treated with IVE OD  - RBA of procedure discussed, questions answered - IVE OD informed consent obtained and signed on 09.29.23 - IVA OD informed consent obtained and signed on 04.03.23 - see procedure note   - f/u in 6 wks, DFE, OCT, possible injection  2. Age related macular degeneration, non-exudative, left eye - The incidence, anatomy, and pathology of dry AMD, risk of progression, and the AREDS 2 study including smoking risks discussed with patient.  - Recommend amsler grid monitoring weekly  - f/u in 5 weeks DFE, OCT  3,4. Hypertensive retinopathy OU - discussed importance of tight BP control - continue to monitor  5. Pseudophakia OU  - s/p CE/IOL  (Dr. Elliot Dally)  - IOL in good position, doing well  - continue to monitor  Ophthalmic Meds Ordered this visit:  Meds ordered this encounter  Medications   aflibercept (EYLEA) SOLN 2 mg     This document serves as a record of services personally performed by Gardiner Sleeper, MD, PhD. It was created on their behalf by San Jetty. Owens Shark, OA an ophthalmic technician. The creation of this record is the provider's dictation and/or activities during the visit.    Electronically signed by: San Jetty. Owens Shark, [place] 03.14.2024 11:57 AM  Gardiner Sleeper, M.D., Ph.D. Diseases & Surgery of the Retina and Vitreous [facility]  I have reviewed the above documentation for accuracy and completeness, and I agree with the above. Gardiner Sleeper, M.D., Ph.D. 03/12/23 11:58 AM  Abbreviations: M myopia (nearsighted); A astigmatism; H hyperopia (farsighted); P presbyopia; Mrx spectacle prescription;  CTL contact lenses; OD right eye; OS left eye; OU both eyes  XT exotropia; ET esotropia; PEK punctate epithelial keratitis; PEE punctate epithelial  erosions; DES dry eye syndrome; MGD meibomian gland dysfunction; ATs artificial tears; PFAT's preservative free artificial tears; [place] nuclear sclerotic cataract; PSC posterior subcapsular cataract; ERM epi-retinal membrane; PVD posterior vitreous detachment; RD retinal detachment; DM diabetes mellitus; DR diabetic retinopathy; NPDR non-proliferative diabetic retinopathy; PDR proliferative diabetic retinopathy; CSME clinically significant macular edema; DME diabetic macular edema; dbh dot blot hemorrhages; CWS cotton wool spot; POAG primary open angle glaucoma; C/D cup-to-disc ratio; HVF humphrey visual field; GVF goldmann visual field; OCT optical coherence tomography; IOP intraocular pressure; BRVO Branch retinal vein occlusion; CRVO central retinal vein occlusion; CRAO central retinal artery occlusion; BRAO branch retinal artery occlusion; RT retinal tear; SB  scleral buckle; PPV pars plana vitrectomy; VH Vitreous hemorrhage; PRP panretinal laser photocoagulation; IVK intravitreal kenalog; VMT vitreomacular traction; MH Macular hole;  NVD neovascularization of the disc; NVE neovascularization elsewhere; AREDS age related eye disease study; ARMD age related macular degeneration; POAG primary open angle glaucoma; EBMD epithelial/anterior basement membrane dystrophy; ACIOL anterior chamber intraocular lens; IOL intraocular lens; PCIOL posterior chamber intraocular lens; Phaco/IOL phacoemulsification with intraocular lens placement; [place] photorefractive keratectomy; LASIK laser assisted in situ keratomileusis; HTN hypertension; DM diabetes mellitus; COPD chronic obstructive pulmonary disease

## 2023-03-05 DIAGNOSIS — Z6821 Body mass index (BMI) 21.0-21.9, adult: Secondary | ICD-10-CM | POA: Diagnosis not present

## 2023-03-05 DIAGNOSIS — M546 Pain in thoracic spine: Secondary | ICD-10-CM | POA: Diagnosis not present

## 2023-03-05 DIAGNOSIS — M791 Myalgia, unspecified site: Secondary | ICD-10-CM | POA: Diagnosis not present

## 2023-03-06 DIAGNOSIS — I10 Essential (primary) hypertension: Secondary | ICD-10-CM | POA: Diagnosis not present

## 2023-03-06 DIAGNOSIS — R69 Illness, unspecified: Secondary | ICD-10-CM | POA: Diagnosis not present

## 2023-03-06 DIAGNOSIS — H353211 Exudative age-related macular degeneration, right eye, with active choroidal neovascularization: Secondary | ICD-10-CM | POA: Diagnosis not present

## 2023-03-09 ENCOUNTER — Emergency Department (HOSPITAL_COMMUNITY)
Admission: EM | Admit: 2023-03-09 | Discharge: 2023-03-09 | Disposition: A | Discharge status: Home / Self Care | Payer: Medicare HMO | Attending: Emergency Medicine | Admitting: Emergency Medicine

## 2023-03-09 ENCOUNTER — Emergency Department (HOSPITAL_COMMUNITY): Payer: Medicare HMO

## 2023-03-09 DIAGNOSIS — R1031 Right lower quadrant pain: Secondary | ICD-10-CM | POA: Insufficient documentation

## 2023-03-09 DIAGNOSIS — R1011 Right upper quadrant pain: Secondary | ICD-10-CM | POA: Insufficient documentation

## 2023-03-09 DIAGNOSIS — R109 Unspecified abdominal pain: Secondary | ICD-10-CM

## 2023-03-09 DIAGNOSIS — R1013 Epigastric pain: Secondary | ICD-10-CM | POA: Insufficient documentation

## 2023-03-09 LAB — URINALYSIS, ROUTINE W REFLEX MICROSCOPIC
Bilirubin Urine: NEGATIVE
Glucose, UA: NEGATIVE mg/dL
Hgb urine dipstick: NEGATIVE
Ketones, ur: 20 mg/dL — AB
Nitrite: NEGATIVE
Protein, ur: NEGATIVE mg/dL
Specific Gravity, Urine: 1.021 (ref 1.005–1.030)
pH: 7 (ref 5.0–8.0)

## 2023-03-09 LAB — COMPREHENSIVE METABOLIC PANEL
ALT: 19 U/L (ref 0–44)
AST: 20 U/L (ref 15–41)
Albumin: 4.5 g/dL (ref 3.5–5.0)
Alkaline Phosphatase: 102 U/L (ref 38–126)
Anion gap: 9 (ref 5–15)
BUN: 14 mg/dL (ref 8–23)
CO2: 24 mmol/L (ref 22–32)
Calcium: 9.2 mg/dL (ref 8.9–10.3)
Chloride: 102 mmol/L (ref 98–111)
Creatinine, Ser: 0.56 mg/dL (ref 0.44–1.00)
GFR, Estimated: >60 mL/min (ref >60)
Glucose, Bld: 112 mg/dL — ABNORMAL HIGH (ref 70–99)
Potassium: 4 mmol/L (ref 3.5–5.1)
Sodium: 135 mmol/L (ref 135–145)
Total Bilirubin: 1.6 mg/dL — ABNORMAL HIGH (ref 0.3–1.2)
Total Protein: 8.3 g/dL — ABNORMAL HIGH (ref 6.5–8.1)

## 2023-03-09 LAB — I-STAT CHEM 8, ED
BUN: 10 mg/dL (ref 8–23)
Calcium, Ion: 1.01 mmol/L — ABNORMAL LOW (ref 1.15–1.40)
Chloride: 107 mmol/L (ref 98–111)
Creatinine, Ser: 0.4 mg/dL — ABNORMAL LOW (ref 0.44–1.00)
Glucose, Bld: 90 mg/dL (ref 70–99)
HCT: 34 % — ABNORMAL LOW (ref 36.0–46.0)
Hemoglobin: 11.6 g/dL — ABNORMAL LOW (ref 12.0–15.0)
Potassium: 3.5 mmol/L (ref 3.5–5.1)
Sodium: 140 mmol/L (ref 135–145)
TCO2: 20 mmol/L — ABNORMAL LOW (ref 22–32)

## 2023-03-09 LAB — CBC WITH DIFFERENTIAL/PLATELET
Abs Immature Granulocytes: 0.02 10*3/uL (ref 0.00–0.07)
Basophils Absolute: 0 10*3/uL (ref 0.0–0.1)
Basophils Relative: 0 %
Eosinophils Absolute: 0.1 10*3/uL (ref 0.0–0.5)
Eosinophils Relative: 1 %
HCT: 40.3 % (ref 36.0–46.0)
Hemoglobin: 13.3 g/dL (ref 12.0–15.0)
Immature Granulocytes: 0 %
Lymphocytes Relative: 13 %
Lymphs Abs: 0.9 10*3/uL (ref 0.7–4.0)
MCH: 31.1 pg (ref 26.0–34.0)
MCHC: 33 g/dL (ref 30.0–36.0)
MCV: 94.4 fL (ref 80.0–100.0)
Monocytes Absolute: 0.6 10*3/uL (ref 0.1–1.0)
Monocytes Relative: 9 %
Neutro Abs: 5.4 10*3/uL (ref 1.7–7.7)
Neutrophils Relative %: 77 %
Platelets: 227 10*3/uL (ref 150–400)
RBC: 4.27 MIL/uL (ref 3.87–5.11)
RDW: 12.5 % (ref 11.5–15.5)
WBC: 7.1 10*3/uL (ref 4.0–10.5)
nRBC: 0 % (ref 0.0–0.2)

## 2023-03-09 LAB — LIPASE, BLOOD: Lipase: 32 U/L (ref 11–51)

## 2023-03-09 MED ORDER — ACETAMINOPHEN 500 MG PO TABS: 500.0000 mg | ORAL_TABLET | Freq: Three times a day (TID) | ORAL | 0 refills | Status: DC | PRN

## 2023-03-09 MED ORDER — OXYCODONE-ACETAMINOPHEN 5-325 MG PO TABS
1.0000 | ORAL_TABLET | Freq: Once | ORAL | Status: AC
  Administered 2023-03-09: 1 via ORAL
  Filled 2023-03-09: qty 1

## 2023-03-09 MED ORDER — ONDANSETRON HCL 4 MG/2ML IJ SOLN
4.0000 mg | Freq: Once | INTRAMUSCULAR | Status: AC
  Administered 2023-03-09: 4 mg via INTRAVENOUS
  Filled 2023-03-09: qty 2

## 2023-03-09 MED ORDER — TIZANIDINE HCL 4 MG PO TABS
4.0000 mg | ORAL_TABLET | Freq: Once | ORAL | Status: AC
  Administered 2023-03-09: 4 mg via ORAL
  Filled 2023-03-09: qty 1

## 2023-03-09 MED ORDER — IOHEXOL 300 MG/ML  SOLN
100.0000 mL | Freq: Once | INTRAMUSCULAR | Status: AC | PRN
  Administered 2023-03-09: 100 mL via INTRAVENOUS

## 2023-03-09 MED ORDER — KETOROLAC TROMETHAMINE 15 MG/ML IJ SOLN
15.0000 mg | Freq: Once | INTRAMUSCULAR | Status: AC
  Administered 2023-03-09: 15 mg via INTRAVENOUS
  Filled 2023-03-09: qty 1

## 2023-03-09 MED ORDER — SODIUM CHLORIDE 0.9 % IV BOLUS
1000.0000 mL | Freq: Once | INTRAVENOUS | Status: AC
  Administered 2023-03-09: 1000 mL via INTRAVENOUS

## 2023-03-09 MED ORDER — SODIUM CHLORIDE (PF) 0.9 % IJ SOLN
INTRAMUSCULAR | Status: AC
  Filled 2023-03-09: qty 50

## 2023-03-09 MED ORDER — FENTANYL CITRATE PF 50 MCG/ML IJ SOSY
50.0000 ug | PREFILLED_SYRINGE | INTRAMUSCULAR | Status: AC | PRN
  Administered 2023-03-09 (×2): 50 ug via INTRAVENOUS
  Filled 2023-03-09 (×2): qty 1

## 2023-03-09 MED ORDER — IBUPROFEN 400 MG PO TABS: 400.0000 mg | ORAL_TABLET | Freq: Three times a day (TID) | ORAL | 0 refills | Status: DC | PRN

## 2023-03-09 MED ORDER — TIZANIDINE HCL 4 MG PO CAPS: 4.0000 mg | ORAL_CAPSULE | Freq: Three times a day (TID) | ORAL | 0 refills | Status: DC | PRN

## 2023-03-09 NOTE — ED Notes (Signed)
Notified MD that patient still complains pf pain 10/10. JRPRN

## 2023-03-09 NOTE — Discharge Instructions (Addendum)
We saw you in the ER for the abdominal pain/back pain. All the results in the ER are normal, labs and imaging. We are not sure what is causing your symptoms. The workup in the ER is not complete, and is limited to screening for life threatening and emergent conditions only, so please see a primary care doctor for further evaluation.  Return to the emergency room if you start having worsening abdominal pain, back pain, confusion, fevers, chills, nausea or vomiting.

## 2023-03-09 NOTE — ED Triage Notes (Signed)
Pt c/o R sided flank pain ongoing since Friday. Denies urinary symptoms, N/V/D, fevers. Pt states she was seen at [facility] and given muscle relaxer without relief.

## 2023-03-09 NOTE — ED Provider Notes (Signed)
[place] EMERGENCY DEPARTMENT AT [facility] Provider Note   CSN: [id number] Arrival date & time: 03/09/23  [id number]     History  Chief Complaint  Patient presents with   Flank Pain    [name] is a 82 y.o. female.  HPI    82 year old patient comes in with chief complaint of severe abdominal pain.  Patient complains of right-sided abdominal and flank pain that started few days ago.  She saw her PCP on Friday, they started her on muscle relaxant.  She has been taking the medication, but her symptoms have worsened.  Patient has no concerning past medical history or abdominal surgical history.  She denies any history of kidney stones and has no UTI-like symptoms.  Home Medications Prior to Admission medications   Medication Sig Start Date End Date Taking? Authorizing Provider  acetaminophen (TYLENOL) 500 MG tablet Take 1 tablet (500 mg total) by mouth every 8 (eight) hours as needed. 03/09/23  Yes Varney Biles, MD  ibuprofen (ADVIL) 400 MG tablet Take 1 tablet (400 mg total) by mouth every 8 (eight) hours as needed. 03/09/23  Yes Varney Biles, MD  tiZANidine (ZANAFLEX) 4 MG capsule Take 1 capsule (4 mg total) by mouth 3 (three) times daily as needed for muscle spasms. 03/09/23  Yes Varney Biles, MD  amoxicillin-clavulanate (AUGMENTIN) 875-125 MG tablet Take 1 tablet by mouth every 12 (twelve) hours. Patient not taking: Reported on 08/19/2022 06/09/22   Hayden Rasmussen, MD  azelastine (ASTELIN) 0.1 % nasal spray USE TWO SPRAYS IN EACH NOSTRIL TWICE DAILY AS DIRECTED Patient not taking: Reported on 10/15/2022 03/15/15   Shawna Orleans, Doe-Hyun R, DO  Bilberry, Vaccinium myrtillus, (BILBERRY PO) Take 1 capsule by mouth daily.    [provider]  cephALEXin (KEFLEX) 500 MG capsule Take 1 capsule (500 mg total) by mouth 3 (three) times daily. 12/05/22   Trula Slade, DPM  Cholecalciferol (D3-1000 PO) Take by mouth.    [provider]  conjugated  estrogens (PREMARIN) vaginal cream Uses 14 days a month    [provider]  cycloSPORINE (RESTASIS) 0.05 % ophthalmic emulsion Restasis 0.05 % eye drops in a dropperette    [provider]  fluticasone (FLONASE) 50 MCG/ACT nasal spray Place 2 sprays into both nostrils daily. 09/22/17   Lucretia Kern, DO  Loperamide HCl (IMODIUM PO) Take by mouth. PRN    [provider]  metoprolol succinate (TOPROL-XL) 50 MG 24 hr tablet daily. Takes for headaches    [provider]  mometasone (ELOCON) 0.1 % cream 1 application    [provider]  Multiple Vitamins-Minerals (ICAPS AREDS 2 PO) Take by mouth daily.    [provider]  Multiple Vitamins-Minerals (ICAPS LUTEIN & ZEAXANTHIN PO) Take by mouth.    [provider]  omeprazole (PRILOSEC) 40 MG capsule Take 1 capsule by mouth daily.    [provider]  ondansetron (ZOFRAN) 4 MG tablet Takes PRN    [provider]  rosuvastatin (CRESTOR) 10 MG tablet     [provider]  terbinafine (LAMISIL) 250 MG tablet Take 1 tablet (250 mg total) by mouth daily. 01/03/20   Trula Slade, DPM  triamcinolone cream (KENALOG) 0.1 %     [provider]  venlafaxine XR (EFFEXOR-XR) 75 MG 24 hr capsule TAKE ONE CAPSULE BY MOUTH ONCE DAILY WITH  BREAKFAST 10/27/17   Lucretia Kern, DO      Allergies    Levaquin [levofloxacin  in d5w]    Review of Systems   Review of Systems  All other systems reviewed and are negative.   Physical Exam Updated Vital Signs BP 130/84   Pulse 93   Temp 99 F (37.2 C) (Oral)   Resp 15   SpO2 100%  Physical Exam Vitals and nursing note reviewed.  Constitutional:      Appearance: She is well-developed.  HENT:     Head: Normocephalic and atraumatic.  Eyes:     Extraocular Movements: Extraocular movements intact.  Cardiovascular:     Rate and Rhythm: Normal rate.  Pulmonary:     Effort: Pulmonary effort is normal.  Abdominal:      Tenderness: There is abdominal tenderness. There is no guarding or rebound.     Comments: Patient has tenderness to palpation over the right upper and right lower quadrant with guarding.  She also has tenderness over the epigastric region and right flank  Musculoskeletal:     Cervical back: Normal range of motion and neck supple.  Skin:    General: Skin is dry.  Neurological:     Mental Status: She is alert and oriented to person, place, and time.     ED Results / Procedures / Treatments   Labs (all labs ordered are listed, but only abnormal results are displayed) Labs Reviewed  URINALYSIS, ROUTINE W REFLEX MICROSCOPIC - Abnormal; Notable for the following components:      Result Value   Ketones, ur 20 (*)    Leukocytes,Ua TRACE (*)    Bacteria, UA RARE (*)    All other components within normal limits  COMPREHENSIVE METABOLIC PANEL - Abnormal; Notable for the following components:   Glucose, Bld 112 (*)    Total Protein 8.3 (*)    Total Bilirubin 1.6 (*)    All other components within normal limits  I-STAT CHEM 8, ED - Abnormal; Notable for the following components:   Creatinine, Ser 0.40 (*)    Calcium, Ion 1.01 (*)    TCO2 20 (*)    Hemoglobin 11.6 (*)    HCT 34.0 (*)    All other components within normal limits  CBC WITH DIFFERENTIAL/PLATELET  LIPASE, BLOOD    EKG None  Radiology CT ABDOMEN PELVIS W CONTRAST  Result Date: 03/09/2023 CLINICAL DATA:  Right lower quadrant pain. Right upper quadrant pain EXAM: CT ABDOMEN AND PELVIS WITH CONTRAST TECHNIQUE: Multidetector CT imaging of the abdomen and pelvis was performed using the standard protocol following bolus administration of intravenous contrast. RADIATION DOSE REDUCTION: This exam was performed according to the departmental dose-optimization program which includes automated exposure control, adjustment of the mA and/or kV according to patient size and/or use of iterative reconstruction technique. CONTRAST:  19mL  OMNIPAQUE IOHEXOL 300 MG/ML  SOLN COMPARISON:  06/09/2022 and older FINDINGS: Lower chest: Bilateral breast implants are seen with calcified capsules. There is breathing motion. There is some dependent scar atelectatic change or fibrotic change. No pleural effusion. Moderate hiatal hernia. Small pericardial effusion. Hepatobiliary: No space-occupying liver lesion. Gallbladder is nondilated. Patent portal vein. Pancreas: Unremarkable. No pancreatic ductal dilatation or surrounding inflammatory changes. Spleen: Normal in size without focal abnormality. Adrenals/Urinary Tract: The adrenal glands are preserved. No enhancing renal mass or collecting system dilatation. Extrarenal pelvis noted to the kidneys particularly on the left. The ureters have normal course and caliber extending down to the bladder. Bladder is relatively contracted. Stomach/Bowel: Moderate colonic stool. Few sigmoid colon diverticula. No obstruction. There are some small bowel stool  appearance of the distal ileum, nonspecific. Stomach and small bowel are nondilated. Normal appendix in the right lower quadrant. Vascular/Lymphatic: Normal caliber aorta and IVC with some scattered atherosclerotic calcified plaque. No specific abnormal lymph node enlargement seen in the abdomen and pelvis. Reproductive: Status post hysterectomy. No adnexal masses. Other: No abdominal wall hernia or abnormality. No abdominopelvic ascites. Musculoskeletal: Mild degenerative changes seen along the spine. IMPRESSION: No bowel obstruction, free air or free fluid. Few colonic diverticula. Normal appendix. Moderate colonic stool. Small hiatal hernia. Electronically Signed   By: Jill Side M.D.   On: 03/09/2023 12:58    Procedures Procedures    Medications Ordered in ED Medications  sodium chloride (PF) 0.9 % injection (has no administration in time range)  ketorolac (TORADOL) 15 MG/ML injection 15 mg (has no administration in time range)  tiZANidine (ZANAFLEX)  tablet 4 mg (has no administration in time range)  oxyCODONE-acetaminophen (PERCOCET/ROXICET) 5-325 MG per tablet 1 tablet (has no administration in time range)  fentaNYL (SUBLIMAZE) injection 50 mcg (50 mcg Intravenous Given 03/09/23 1246)  ondansetron (ZOFRAN) injection 4 mg (4 mg Intravenous Given 03/09/23 1120)  sodium chloride 0.9 % bolus 1,000 mL (1,000 mLs Intravenous New Bag/Given 03/09/23 1119)  iohexol (OMNIPAQUE) 300 MG/ML solution 100 mL (100 mLs Intravenous Contrast Given 03/09/23 1225)    ED Course/ Medical Decision Making/ A&P                             Medical Decision Making Amount and/or Complexity of Data Reviewed Labs: ordered. Radiology: ordered.  Risk OTC drugs. Prescription drug management.   This patient presents to the ED with chief complaint(s) of right-sided abdominal pain, back pain with no concerning pertinent past medical history and no abdominal surgical history. The complaint involves an extensive differential diagnosis and also carries with it a high risk of complications and morbidity.    The differential diagnosis includes: Cholecystitis, pancreatitis, gastritis, appendicitis, kidney stone, musculoskeletal pain, pyelonephritis.  There is no rash, therefore I do not think there is any shingles.  Pain also present over the entire right side of the abdomen.  The initial plan is to get basic labs and proceed with CT abdomen pelvis with contrast.   Additional history obtained: Additional history obtained from spouse Records reviewed Primary Care Documents -seems like patient was prescribed Zanaflex.  Independent labs interpretation:  The following labs were independently interpreted: CBC, metabolic profile, lipase are all normal.  LFTs are also reassuring. UA shows no signs of infection.  Independent visualization and interpretation of imaging: - I independently visualized the following imaging with scope of interpretation limited to determining  acute life threatening conditions related to emergency care: CT abdomen pelvis, which revealed no evidence of free air.  Treatment and Reassessment: Patient received 2 rounds of IV fentanyl.  She was still having pain.  My repeat abdominal exam is unchanged.  She still has diffuse abdominal tenderness.  CT scan results discussed with the patient and the husband.  In the setting of reassuring CAT scan, reassuring labs, the plan is for them to start taking ibuprofen and Tylenol along with the muscle relaxant.  They will return to the ER if symptoms get worse.  I have advised that they set up an appointment with PCP for Friday.   Final Clinical Impression(s) / ED Diagnoses Final diagnoses:  Right sided abdominal pain    Rx / DC Orders ED Discharge Orders  Ordered    ibuprofen (ADVIL) 400 MG tablet  Every 8 hours PRN        03/09/23 1328    acetaminophen (TYLENOL) 500 MG tablet  Every 8 hours PRN        03/09/23 1328    tiZANidine (ZANAFLEX) 4 MG capsule  3 times daily PRN        03/09/23 1328              Varney Biles, MD 03/09/23 1334

## 2023-03-12 ENCOUNTER — Encounter (INDEPENDENT_AMBULATORY_CARE_PROVIDER_SITE_OTHER): Payer: Self-pay | Admitting: Ophthalmology

## 2023-03-12 ENCOUNTER — Ambulatory Visit (INDEPENDENT_AMBULATORY_CARE_PROVIDER_SITE_OTHER): Payer: Medicare HMO | Admitting: Ophthalmology

## 2023-03-12 DIAGNOSIS — H35033 Hypertensive retinopathy, bilateral: Secondary | ICD-10-CM | POA: Diagnosis not present

## 2023-03-12 DIAGNOSIS — H353122 Nonexudative age-related macular degeneration, left eye, intermediate dry stage: Secondary | ICD-10-CM

## 2023-03-12 DIAGNOSIS — I10 Essential (primary) hypertension: Secondary | ICD-10-CM | POA: Diagnosis not present

## 2023-03-12 DIAGNOSIS — H353211 Exudative age-related macular degeneration, right eye, with active choroidal neovascularization: Secondary | ICD-10-CM | POA: Diagnosis not present

## 2023-03-12 DIAGNOSIS — Z961 Presence of intraocular lens: Secondary | ICD-10-CM

## 2023-03-12 MED ORDER — AFLIBERCEPT 2MG/0.05ML IZ SOLN FOR KALEIDOSCOPE
2.0000 mg | INTRAVITREAL | Status: AC | PRN
  Administered 2023-03-12: 2 mg via INTRAVITREAL

## 2023-03-20 DIAGNOSIS — M546 Pain in thoracic spine: Secondary | ICD-10-CM | POA: Diagnosis not present

## 2023-04-01 ENCOUNTER — Other Ambulatory Visit (HOSPITAL_BASED_OUTPATIENT_CLINIC_OR_DEPARTMENT_OTHER): Payer: Self-pay

## 2023-04-01 ENCOUNTER — Other Ambulatory Visit: Payer: Self-pay

## 2023-04-01 ENCOUNTER — Emergency Department (HOSPITAL_BASED_OUTPATIENT_CLINIC_OR_DEPARTMENT_OTHER)
Admission: EM | Admit: 2023-04-01 | Discharge: 2023-04-01 | Disposition: A | Discharge status: Home / Self Care | Payer: Medicare HMO | Attending: Emergency Medicine | Admitting: Emergency Medicine

## 2023-04-01 ENCOUNTER — Encounter (HOSPITAL_BASED_OUTPATIENT_CLINIC_OR_DEPARTMENT_OTHER): Payer: Self-pay

## 2023-04-01 DIAGNOSIS — I10 Essential (primary) hypertension: Secondary | ICD-10-CM | POA: Diagnosis not present

## 2023-04-01 DIAGNOSIS — R109 Unspecified abdominal pain: Secondary | ICD-10-CM | POA: Insufficient documentation

## 2023-04-01 DIAGNOSIS — M545 Low back pain, unspecified: Secondary | ICD-10-CM | POA: Diagnosis present

## 2023-04-01 DIAGNOSIS — M5459 Other low back pain: Secondary | ICD-10-CM | POA: Diagnosis not present

## 2023-04-01 DIAGNOSIS — Z79899 Other long term (current) drug therapy: Secondary | ICD-10-CM | POA: Insufficient documentation

## 2023-04-01 LAB — URINALYSIS, ROUTINE W REFLEX MICROSCOPIC
Bilirubin Urine: NEGATIVE
Glucose, UA: NEGATIVE mg/dL
Hgb urine dipstick: NEGATIVE
Leukocytes,Ua: NEGATIVE
Nitrite: NEGATIVE
Specific Gravity, Urine: 1.026 (ref 1.005–1.030)
pH: 6 (ref 5.0–8.0)

## 2023-04-01 MED ORDER — LIDOCAINE 4 % EX PTCH: 1.0000 | MEDICATED_PATCH | CUTANEOUS | 0 refills | Status: DC

## 2023-04-01 MED ORDER — LIDOCAINE 4 % EX PTCH
1.0000 | MEDICATED_PATCH | CUTANEOUS | 0 refills | Status: DC
  Filled 2023-04-01: qty 5, 5d supply, fill #0

## 2023-04-01 MED ORDER — LIDOCAINE 5 % EX PTCH
1.0000 | MEDICATED_PATCH | CUTANEOUS | Status: DC
  Administered 2023-04-01: 1 via TRANSDERMAL
  Filled 2023-04-01: qty 1

## 2023-04-01 MED ORDER — OXYCODONE-ACETAMINOPHEN 5-325 MG PO TABS: 1.0000 | ORAL_TABLET | Freq: Four times a day (QID) | ORAL | 0 refills | Status: DC | PRN

## 2023-04-01 MED ORDER — CYCLOBENZAPRINE HCL 10 MG PO TABS
10.0000 mg | ORAL_TABLET | Freq: Two times a day (BID) | ORAL | 0 refills | Status: DC | PRN
  Filled 2023-04-01: qty 20, 10d supply, fill #0

## 2023-04-01 MED ORDER — OXYCODONE-ACETAMINOPHEN 5-325 MG PO TABS
1.0000 | ORAL_TABLET | Freq: Four times a day (QID) | ORAL | 0 refills | Status: DC | PRN
  Filled 2023-04-01: qty 6, 2d supply, fill #0

## 2023-04-01 MED ORDER — KETOROLAC TROMETHAMINE 30 MG/ML IJ SOLN
30.0000 mg | Freq: Once | INTRAMUSCULAR | Status: AC
  Administered 2023-04-01: 30 mg via INTRAMUSCULAR
  Filled 2023-04-01: qty 1

## 2023-04-01 MED ORDER — CYCLOBENZAPRINE HCL 10 MG PO TABS: 10.0000 mg | ORAL_TABLET | Freq: Two times a day (BID) | ORAL | 0 refills | Status: DC | PRN

## 2023-04-01 MED ORDER — OXYCODONE-ACETAMINOPHEN 5-325 MG PO TABS
1.0000 | ORAL_TABLET | Freq: Once | ORAL | Status: AC
  Administered 2023-04-01: 1 via ORAL
  Filled 2023-04-01: qty 1

## 2023-04-01 NOTE — ED Notes (Signed)
Pt aware of the need for a urine... Unable to currently provide... 

## 2023-04-01 NOTE — ED Triage Notes (Signed)
Patient here POV from Home.  Endorses Left Sided Flank Pain that began a few hours ago. No Dysuria noted. No Trauma or noted Injury.   NAD Noted during Triage. A&Ox4. GCS 15. BIB Wheelchair.

## 2023-04-01 NOTE — ED Provider Notes (Signed)
[place] EMERGENCY DEPARTMENT AT [facility] Provider Note   CSN: [id number] Arrival date & time: 04/01/23  1117     History  Chief Complaint  Patient presents with   Flank Pain    [name] is a 82 y.o. female with a past medical history of anxiety, hypertension, send today for evaluation of low back pain.  Patient reports she has had left-sided flank pain and lower back pain in the last 7 days.  States the pain got worse today.  She denies any urinary symptoms, bowel incontinence, distal weakness.  States the pain goes from her left lower back radiating down to her left leg.  States the last time she was at this hospital she was given fentanyl but reports minimal improvement.   Flank Pain    Past Medical History:  Diagnosis Date   Actinic keratosis 12/03/2012   Allergy    Anxiety    Effexor helps--psychiatrist Dr Evelene Croon   Arthritis    osteoarthritis, hands   Atypical chest pain    EF 86%, breast attenuation, no significant ischemia   Cataract    Cervical radiculopathy 11/18/2013   Chronic headache    takes Metoprolol for this   Diverticulosis    Erosive esophagitis    GERD (gastroesophageal reflux disease) 11/2009   EGD: Dr. 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 Authorizing Provider  acetaminophen (TYLENOL) 500 MG tablet Take 1 tablet (500 mg total) by mouth every 8 (eight) hours as needed. 03/09/23   Derwood Kaplan, MD  amoxicillin-clavulanate (AUGMENTIN) 875-125 MG tablet Take 1 tablet by mouth every 12 (twelve) hours. Patient not taking: Reported on 08/19/2022 06/09/22   Terrilee Files, MD  azelastine (ASTELIN) 0.1 % nasal spray USE TWO SPRAYS IN EACH NOSTRIL TWICE DAILY AS DIRECTED Patient not taking: Reported on 10/15/2022 03/15/15   Artist Pais, Doe-Hyun R, DO  Bilberry, Vaccinium myrtillus, (BILBERRY PO) Take 1 capsule by mouth daily.    [provider]  cephALEXin (KEFLEX) 500 MG capsule Take 1 capsule (500 mg total) by mouth 3 (three) times daily. 12/05/22   Vivi Barrack, DPM  Cholecalciferol (D3-1000 PO) Take by mouth.    [provider]  fluticasone (FLONASE) 50 MCG/ACT nasal spray Place 2 sprays into both nostrils daily. 09/22/17   Terressa Koyanagi, DO  ibuprofen (ADVIL) 400 MG tablet Take 1 tablet (400 mg total) by mouth every 8 (eight) hours as needed. 03/09/23   Derwood Kaplan, MD  metoprolol succinate (TOPROL-XL) 50 MG 24 hr tablet daily. Takes for headaches    [provider]  Multiple Vitamins-Minerals (ICAPS AREDS 2 PO) Take by mouth daily.    [provider]  Multiple Vitamins-Minerals (ICAPS LUTEIN & ZEAXANTHIN PO) Take by mouth.    [provider]  omeprazole (PRILOSEC) 40 MG capsule Take 1 capsule by mouth daily.    [provider]  rosuvastatin (CRESTOR) 10 MG tablet     [provider]  terbinafine (LAMISIL) 250 MG tablet Take 1 tablet (250 mg total) by mouth daily. 01/03/20   Vivi Barrack, DPM  venlafaxine XR (EFFEXOR-XR) 75 MG 24 hr capsule TAKE ONE CAPSULE BY MOUTH ONCE DAILY WITH  BREAKFAST 10/27/17   Terressa Koyanagi, DO      Allergies    Levaquin [levofloxacin in  d5w]    Review of Systems   Review of Systems  Genitourinary:  Positive for flank pain.    Physical Exam Updated Vital Signs BP (!) 138/122 (BP Location: Right Arm)   Pulse 98   Temp 97.8 F (36.6 C)   Resp 18   Ht  (1.651 m)   Wt 59 kg   SpO2 99%   BMI 21.63 kg/m  Physical Exam Vitals and nursing note reviewed.  Constitutional:      Appearance: Normal appearance.  HENT:     Head: Normocephalic and atraumatic.     Mouth/Throat:     Mouth: Mucous membranes are moist.  Eyes:     General: No scleral icterus. Cardiovascular:     Rate and Rhythm: Normal rate and regular rhythm.     Pulses: Normal pulses.     Heart sounds: Normal heart sounds.  Pulmonary:     Effort: Pulmonary effort is normal.     Breath sounds: Normal breath sounds.  Abdominal:     General: Abdomen is flat.     Palpations: Abdomen is soft.     Tenderness: There is no abdominal tenderness.  Musculoskeletal:        General: No deformity.     Lumbar back: Tenderness present.       Back:  Skin:    General: Skin is warm.     Findings: No rash.  Neurological:     General: No focal deficit present.     Mental Status: She is alert.  Psychiatric:        Mood and Affect: Mood normal.     ED Results / Procedures / Treatments   Labs (all labs ordered are listed, but only abnormal results are displayed) Labs Reviewed  URINALYSIS, ROUTINE W REFLEX MICROSCOPIC    EKG None  Radiology No results found.  Procedures Procedures    Medications Ordered in ED Medications - No data to display  ED Course/ Medical Decision Making/ A&P                             Medical Decision Making Amount and/or Complexity of Data Reviewed Labs: ordered.  Risk OTC drugs. Prescription drug management.   This patient presents to the ED for low back pain, this involves an extensive number of treatment options, and is a complaint that carries with a high risk of complications and morbidity.  The  differential diagnosis includes musculoskeletal pain, spasm, sciatica, UTI, pyelonephritis, herniated disc, ligament injury, fracture, dislocation.  This is not an exhaustive list.  Lab tests: I ordered and personally interpreted labs.  The pertinent results include: Urinalysis unremarkable.  Problem list/ ED course/ Critical interventions/ Medical management: HPI: See above Vital  signs within normal range and stable throughout visit. Laboratory/imaging studies significant for: See above. On physical examination, patient is afebrile and appears in no acute distress. This patient presents with back pain most consistent with musculoskeletal strain versus sciatica. No back pain red flags on history or physical. Presentation not consistent with malignancy (lack of history of malignancy, lack of B symptoms), fracture (no trauma, no bony tenderness to palpation), cauda equina (no bowel or urinary incontinence/retention, no saddle anesthesia, no distal weakness), epidural abscess (no IVDU, vertebral tenderness), renal colic, pyelonephritis (afebrile, no CVAT, no urinary symptoms). Given the clinical picture, no indication for imaging at this time.  Patient was given Toradol, Percocet and lidocaine patch.  Reevaluation of patient after this medication showed the patient improved. I sent an Rx of Flexeril, Percocet for pain as needed.  Advised patient to follow-up with PCP for further evaluation and management.  Strict return precaution given.  I have reviewed the patient home medicines and have made adjustments as needed.  Cardiac monitoring/EKG: The patient was maintained on a cardiac monitor.  I personally reviewed and interpreted the cardiac monitor which showed an underlying rhythm of: sinus rhythm.  Additional history obtained: External records from outside source obtained and reviewed including: Chart review including previous notes, labs, imaging.  Consultations obtained:  Disposition Continued  outpatient therapy. Follow-up with PCP recommended for reevaluation of symptoms. Treatment plan discussed with patient.  Pt acknowledged understanding was agreeable to the plan. Worrisome signs and symptoms were discussed with patient, and patient acknowledged understanding to return to the ED if they noticed these signs and symptoms. Patient was stable upon discharge.   This chart was dictated using voice recognition software.  Despite best efforts to proofread,  errors can occur which can change the documentation meaning.          Final Clinical Impression(s) / ED Diagnoses Final diagnoses:  Acute left-sided low back pain without sciatica    Rx / DC Orders ED Discharge Orders          Ordered    cyclobenzaprine (FLEXERIL) 10 MG tablet  2 times daily PRN,   Status:  Discontinued        04/01/23 1508    lidocaine 4 %  Every 24 hours,   Status:  Discontinued        04/01/23 1508    oxyCODONE-acetaminophen (PERCOCET/ROXICET) 5-325 MG tablet  Every 6 hours PRN,   Status:  Discontinued        04/01/23 1508    cyclobenzaprine (FLEXERIL) 10 MG tablet  2 times daily PRN        04/01/23 1515    lidocaine 4 %  Every 24 hours        04/01/23 1515    oxyCODONE-acetaminophen (PERCOCET/ROXICET) 5-325 MG tablet  Every 6 hours PRN        04/01/23 1515           Present today for, Migraines   Jeanelle Malling, PA 04/01/23 1722    Arby Barrette, MD 04/02/23 1135

## 2023-04-01 NOTE — ED Notes (Signed)
Discharge paperwork given and verbally understood. 

## 2023-04-01 NOTE — Discharge Instructions (Addendum)
Please take your medications as prescribed. Take tylenol/ibuprofen, flexeril or Percocet as needed for pain. I recommend close follow-up with PCP for reevaluation.  Please do not hesitate to return to emergency department if worrisome signs symptoms we discussed become apparent.  

## 2023-04-03 DIAGNOSIS — R413 Other amnesia: Secondary | ICD-10-CM | POA: Diagnosis not present

## 2023-04-03 DIAGNOSIS — G43909 Migraine, unspecified, not intractable, without status migrainosus: Secondary | ICD-10-CM | POA: Diagnosis not present

## 2023-04-03 DIAGNOSIS — H353211 Exudative age-related macular degeneration, right eye, with active choroidal neovascularization: Secondary | ICD-10-CM | POA: Diagnosis not present

## 2023-04-03 DIAGNOSIS — M5136 Other intervertebral disc degeneration, lumbar region: Secondary | ICD-10-CM | POA: Diagnosis not present

## 2023-04-20 NOTE — Progress Notes (Signed)
[facility] - Clinic Note  04/22/2023    CHIEF COMPLAINT Patient presents for Retina Follow Up   HISTORY OF PRESENT ILLNESS: [name] is a 82 y.o. female who presents to the clinic today for:   HPI     Retina Follow Up   Patient presents with  Wet AMD.  In right eye.  Severity is moderate.  Duration of 6 weeks.  Since onset it is stable.  I, the attending physician,  performed the HPI with the patient and updated documentation appropriately.        Comments   Pt here for 6 wk ret f/u exu ARMD OD. Pt states [facility] the same.       Last edited by Rennis Chris, MD on 04/22/2023  2:51 PM.     Patient feels that the vision is the same.   Referring physician: Soundra Pilon, FNP [address] [place],  [place] [address]  HISTORICAL INFORMATION:   Selected notes from the MEDICAL RECORD NUMBER Referred by Dr. Zenaida Niece for ex ARMD LEE:  Ocular Hx- PMH-    CURRENT MEDICATIONS: No current outpatient medications on file. (Ophthalmic Drugs)   No current facility-administered medications for this visit. (Ophthalmic Drugs)   Current Outpatient Medications (Other)  Medication Sig   acetaminophen (TYLENOL) 500 MG tablet Take 1 tablet (500 mg total) by mouth every 8 (eight) hours as needed.   Bilberry, Vaccinium myrtillus, (BILBERRY PO) Take 1 capsule by mouth daily.   Cholecalciferol (D3-1000 PO) Take by mouth.   cyclobenzaprine (FLEXERIL) 10 MG tablet Take 1 tablet (10 mg total) by mouth 2 (two) times daily as needed for muscle spasms.   fluticasone (FLONASE) 50 MCG/ACT nasal spray Place 2 sprays into both nostrils daily.   ibuprofen (ADVIL) 400 MG tablet Take 1 tablet (400 mg total) by mouth every 8 (eight) hours as needed.   lidocaine 4 % Place 1 patch onto the skin daily.   metoprolol succinate (TOPROL-XL) 50 MG 24 hr tablet daily. Takes for headaches   Multiple Vitamins-Minerals (ICAPS AREDS 2 PO) Take by mouth daily.   Multiple Vitamins-Minerals (ICAPS  LUTEIN & ZEAXANTHIN PO) Take by mouth.   omeprazole (PRILOSEC) 40 MG capsule Take 1 capsule by mouth daily.   oxyCODONE-acetaminophen (PERCOCET/ROXICET) 5-325 MG tablet Take 1 tablet by mouth every 6 (six) hours as needed for severe pain.   rosuvastatin (CRESTOR) 10 MG tablet    venlafaxine XR (EFFEXOR-XR) 75 MG 24 hr capsule TAKE ONE CAPSULE BY MOUTH ONCE DAILY WITH  BREAKFAST   amoxicillin-clavulanate (AUGMENTIN) 875-125 MG tablet Take 1 tablet by mouth every 12 (twelve) hours. (Patient not taking: Reported on 08/19/2022)   azelastine (ASTELIN) 0.1 % nasal spray USE TWO SPRAYS IN EACH NOSTRIL TWICE DAILY AS DIRECTED (Patient not taking: Reported on 10/15/2022)   cephALEXin (KEFLEX) 500 MG capsule Take 1 capsule (500 mg total) by mouth 3 (three) times daily.   terbinafine (LAMISIL) 250 MG tablet Take 1 tablet (250 mg total) by mouth daily.   No current facility-administered medications for this visit. (Other)   REVIEW OF SYSTEMS: ROS   Positive for: Eyes, Psychiatric Negative for: Constitutional, Gastrointestinal, Neurological, Skin, Genitourinary, Musculoskeletal, HENT, Endocrine, Cardiovascular, Respiratory, Allergic/Imm, Heme/Lymph Last edited by Thompson Grayer, COT on 04/22/2023  1:20 PM.     ALLERGIES Allergies  Allergen Reactions   Levaquin [Levofloxacin In D5w] Nausea Only    Dizziness and tremulousness   PAST MEDICAL HISTORY Past Medical History:  Diagnosis  Date   Actinic keratosis 12/03/2012   Allergy    Anxiety    Effexor helps--psychiatrist Dr Evelene Croon   Arthritis    osteoarthritis, hands   Atypical chest pain    EF 86%, breast attenuation, no significant ischemia   Cataract    Cervical radiculopathy 11/18/2013   Chronic headache    takes Metoprolol for this   Diverticulosis    Erosive esophagitis    GERD (gastroesophageal reflux disease) 11/2009   EGD: Dr. 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   Past Surgical History:  Procedure Laterality Date   AUGMENTATION MAMMAPLASTY Bilateral    silicone gel implants   Birth mark removed     CATARACT EXTRACTION Bilateral    COLONOSCOPY     COLPOSCOPY     laser vaporization of upper vagina  1998   NM MYOCAR PERF WALL MOTION  01/24/2008   EF 86% neg ischemia   VAGINAL HYSTERECTOMY  1978   FAMILY HISTORY Family History  Problem Relation Age of Onset   Heart disease Mother    Heart failure Mother    Breast cancer Mother 10   Prostate cancer Father    Inflammatory bowel disease Sister    Diabetes Maternal Aunt    Colon cancer Paternal Grandfather    Stomach cancer Neg Hx    Pancreatic cancer Neg Hx    Esophageal cancer Neg Hx    Rectal cancer Neg Hx    SOCIAL HISTORY Social History   Tobacco Use   Smoking status: Former    Packs/day: 1.00    Years: 10.00    Additional pack years: 0.00    Total pack years: 10.00    Types: Cigarettes    Quit date: 12/15/1988    Years since quitting: 34.3   Smokeless tobacco: Never   Tobacco comments:    quit in the 1980's  Vaping Use   Vaping Use: Never used  Substance Use Topics   Alcohol use: Not Currently    Alcohol/week: 14.0 standard drinks of alcohol    Types: 14 Standard drinks or equivalent per week    Comment: drinks wine every day; 2 drinks a day    Drug use: No       OPHTHALMIC EXAM:  Base Eye Exam     Visual Acuity (Snellen - Linear)       Right Left   Dist cc 20/30 20/25 -1   Dist ph cc NI NI    Correction: Glasses  Pt has rx specs w/ her today. MS        Tonometry (Tonopen, 1:24 PM)       Right Left   Pressure 12 13         Pupils       Pupils Dark Light Shape  React APD   Right PERRL 4 3 Round Brisk None   Left PERRL 4 3 Round Brisk None         Visual Fields (Counting fingers)       Left Right    Full Full         Extraocular Movement  Right Left    Full, Ortho Full, Ortho         Neuro/Psych     Oriented x3: Yes   Mood/Affect: Normal         Dilation     Both eyes: 1.0% Mydriacyl, 2.5% Phenylephrine @ 1:24 PM           Slit Lamp and Fundus Exam     Slit Lamp Exam       Right Left   Lids/Lashes Dermatochalasis - upper lid, mild MGD Dermatochalasis - upper lid, mild MGD   Conjunctiva/Sclera nasal pingeucula White and quiet   Cornea arcus, trace PEE, well healed cataract wound arcus, 1+PEE, well healed cataract wound, mild tear film debris   Anterior Chamber deep, clear, narrow temporal angle Deep and quiet   Iris Round and dilated Round and dilated   Lens PC IOL in good position, trace Posterior capsular opacification PC IOL in good position with open PC   Anterior Vitreous syneresis, Posterior vitreous detachment, vitreous condensations syneresis, Posterior vitreous detachment         Fundus Exam       Right Left   Disc Pink and Sharp Pink and Sharp, +PPP ST rim   C/D Ratio 0.2 0.2   Macula Blunted foveal reflex, central edema / shallow SRF -- stably improved no heme, +drusen, RPE mottling Blunted foveal reflex, Drusen, RPE mottling and clumping, No heme or edema   Vessels attenuated, mild AV crossing changes, Tortuous mild attenuation, mild tortuosity   Periphery Attached, mild midzonal drusen, no heme Attached, mild midzonal drusen, no heme           Refraction     Wearing Rx       Sphere Cylinder Axis Add   Right -0.50 +0.50 146 +2.50   Left -1.25 +1.25 167 +2.50           IMAGING AND PROCEDURES  Imaging and Procedures for 04/22/2023  OCT, Retina - OU - Both Eyes       Right Eye Quality was good. Central Foveal Thickness: 231. Progression has been stable. Findings include  normal foveal contour, no IRF, no SRF, retinal drusen , intraretinal hyper-reflective material, pigment epithelial detachment, outer retinal atrophy (stable improvement in central SRF overlying stable low PED temporal fovea; +ORA, ?early tr [facility]).   Left Eye Quality was good. Central Foveal Thickness: 275. Progression has been stable. Findings include normal foveal contour, no IRF, no SRF, retinal drusen , outer retinal atrophy (Patchy ORA).   Notes *Images captured and stored on drive  Diagnosis / Impression:  OD: stable improvement in central SRF overlying stable low PED temporal fovea; +ORA, ?early tr SRHM OS: non-exu ARMD, patchy ORA  Clinical management:  See below  Abbreviations: NFP - Normal foveal profile. CME - cystoid macular edema. PED - pigment epithelial detachment. IRF - intraretinal fluid. SRF - subretinal fluid. EZ - ellipsoid zone. ERM - epiretinal membrane. ORA - outer retinal atrophy. ORT - outer retinal tubulation. SRHM - subretinal hyper-reflective material. IRHM - intraretinal hyper-reflective material      Intravitreal Injection, Pharmacologic Agent - OD - Right Eye       Time Out 04/22/2023. 2:12 PM. Confirmed correct patient, procedure, site, and patient consented.   Anesthesia Topical anesthesia was used. Anesthetic medications included Lidocaine 2%, Proparacaine 0.5%.   Procedure Preparation included 5% betadine to ocular surface, eyelid speculum. A (32g) needle was used.   Injection: 2 mg aflibercept 2 MG/0.05ML   Route: Intravitreal,  Site: Right Eye   NDC: [id number], Lot: [id number], Expiration date: 04/13/2024, Waste: 0 mL   Post-op Post injection exam found visual acuity of at least counting fingers. The patient tolerated the procedure well. There were no complications. The patient received written and verbal post procedure care education. Post injection medications were not given.            ASSESSMENT/PLAN:    ICD-10-CM   1. Exudative  age-related macular degeneration of right eye with active choroidal neovascularization (HCC)  H35.3211 OCT, Retina - OU - Both Eyes    Intravitreal Injection, Pharmacologic Agent - OD - Right Eye    aflibercept (EYLEA) SOLN 2 mg    2. Intermediate stage nonexudative age-related macular degeneration of left eye  H35.3122     3. Essential hypertension  I10     4. Hypertensive retinopathy of both eyes  H35.033     5. 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, right eye   - s/p IVA OD #1 (04.03.23), #2 (05.08.23), #3 (06.05.23), #4(07.11.23), #5 (08.22.23) - s/p IVE OD #1 (09.23.23), #2 (10.11.23) #3 (12.06.23), #4 (01.19.24), #5 (02.22.24), #6 (03.15.24)  - at initial visit, pt reported 1-2 mo history of central "spot" in vision OD - FA (04.03.23) shows +CNV with mild late leakage IT fovea, no obvious smokestack or expansile dot **interval increase in SRF on Avastin noted on OCT at 6 weeks on 08.22.23 on Eylea at 6 wks on 01.19.24** - BCVA stable 20/30 - stable - OCT shows stable improvement in central SRF overlying stable low PED temporal fovea, +ORA, ?early tr SRHM at 6 weeks  - pt reports high stress and +steroid use -- ?CSR component  - continue AREDS2  - recommend IVE OD #7 today, 05.08.24 - with follow up in 6 weeks  - pt wishes to be treated with IVE OD  - RBA of procedure discussed, questions answered - IVE OD informed consent obtained and signed on 09.29.23 - IVA OD informed consent obtained and signed on 04.03.23 - see procedure note   - f/u in 6 wks, DFE, OCT, possible injection  2. Age related macular degeneration, non-exudative, left eye - The incidence, anatomy, and pathology of dry AMD, risk of progression, and the AREDS 2 study including smoking risks discussed with patient.  - Recommend amsler grid monitoring weekly  - f/u in 5 weeks DFE, OCT  3,4. Hypertensive retinopathy OU - discussed importance of tight BP control - continue  to monitor  5. Pseudophakia OU  - s/p CE/IOL (Dr. Hortense Ramal)  - IOL in good position, doing well  - continue to monitor  Ophthalmic Meds Ordered this visit:  Meds ordered this encounter  Medications   aflibercept (EYLEA) SOLN 2 mg     This document serves as a record of services personally performed by Karie Chimera, MD, PhD. It was created on their behalf by De Blanch, an ophthalmic technician. The creation of this record is the provider's dictation and/or activities during the visit.    Electronically signed by: De Blanch, OA, 04/22/23  2:54 PM  This document serves as a record of services personally performed by Karie Chimera, MD, PhD. It was created on their behalf by Gerilyn Nestle, COT an ophthalmic technician. The creation of this record is the provider's dictation and/or activities during the visit.    Electronically signed by:  Gerilyn Nestle, COT 05.08.24 2:54 PM  Karie Chimera, M.D., Ph.D.  Diseases & Surgery of the Retina and Vitreous [facility]  I have reviewed the above documentation for accuracy and completeness, and I agree with the above. Karie Chimera, M.D., Ph.D. 04/22/23 2:59 PM  Abbreviations: M myopia (nearsighted); A astigmatism; H hyperopia (farsighted); P presbyopia; Mrx spectacle prescription;  CTL contact lenses; OD right eye; OS left eye; OU both eyes  XT exotropia; ET esotropia; PEK punctate epithelial keratitis; PEE punctate epithelial erosions; DES dry eye syndrome; MGD meibomian gland dysfunction; ATs artificial tears; PFAT's preservative free artificial tears; NSC nuclear sclerotic cataract; PSC posterior subcapsular cataract; ERM epi-retinal membrane; PVD posterior vitreous detachment; RD retinal detachment; DM diabetes mellitus; DR diabetic retinopathy; NPDR non-proliferative diabetic retinopathy; PDR proliferative diabetic retinopathy; CSME clinically significant macular edema; DME diabetic macular  edema; dbh dot blot hemorrhages; CWS cotton wool spot; POAG primary open angle glaucoma; C/D cup-to-disc ratio; HVF humphrey visual field; GVF goldmann visual field; OCT optical coherence tomography; IOP intraocular pressure; BRVO Branch retinal vein occlusion; CRVO central retinal vein occlusion; CRAO central retinal artery occlusion; BRAO branch retinal artery occlusion; RT retinal tear; SB scleral buckle; PPV pars plana vitrectomy; VH Vitreous hemorrhage; PRP panretinal laser photocoagulation; IVK intravitreal kenalog; VMT vitreomacular traction; MH Macular hole;  NVD neovascularization of the disc; NVE neovascularization elsewhere; AREDS age related eye disease study; ARMD age related macular degeneration; POAG primary open angle glaucoma; EBMD epithelial/anterior basement membrane dystrophy; ACIOL anterior chamber intraocular lens; IOL intraocular lens; PCIOL posterior chamber intraocular lens; Phaco/IOL phacoemulsification with intraocular lens placement; PRK photorefractive keratectomy; LASIK laser assisted in situ keratomileusis; HTN hypertension; DM diabetes mellitus; COPD chronic obstructive pulmonary disease

## 2023-04-22 ENCOUNTER — Encounter (INDEPENDENT_AMBULATORY_CARE_PROVIDER_SITE_OTHER): Payer: Self-pay | Admitting: Ophthalmology

## 2023-04-22 ENCOUNTER — Ambulatory Visit (INDEPENDENT_AMBULATORY_CARE_PROVIDER_SITE_OTHER): Payer: Medicare HMO | Admitting: Ophthalmology

## 2023-04-22 DIAGNOSIS — I10 Essential (primary) hypertension: Secondary | ICD-10-CM

## 2023-04-22 DIAGNOSIS — H35033 Hypertensive retinopathy, bilateral: Secondary | ICD-10-CM

## 2023-04-22 DIAGNOSIS — H353122 Nonexudative age-related macular degeneration, left eye, intermediate dry stage: Secondary | ICD-10-CM | POA: Diagnosis not present

## 2023-04-22 DIAGNOSIS — Z961 Presence of intraocular lens: Secondary | ICD-10-CM

## 2023-04-22 DIAGNOSIS — H353211 Exudative age-related macular degeneration, right eye, with active choroidal neovascularization: Secondary | ICD-10-CM

## 2023-04-22 MED ORDER — AFLIBERCEPT 2MG/0.05ML IZ SOLN FOR KALEIDOSCOPE
2.0000 mg | INTRAVITREAL | Status: AC | PRN
  Administered 2023-04-22: 2 mg via INTRAVITREAL

## 2023-04-29 ENCOUNTER — Encounter: Payer: Self-pay | Admitting: Family Medicine

## 2023-04-29 ENCOUNTER — Other Ambulatory Visit: Payer: Self-pay | Admitting: Family Medicine

## 2023-04-29 DIAGNOSIS — R413 Other amnesia: Secondary | ICD-10-CM

## 2023-04-29 DIAGNOSIS — H353211 Exudative age-related macular degeneration, right eye, with active choroidal neovascularization: Secondary | ICD-10-CM

## 2023-04-29 DIAGNOSIS — R42 Dizziness and giddiness: Secondary | ICD-10-CM

## 2023-05-14 DIAGNOSIS — K219 Gastro-esophageal reflux disease without esophagitis: Secondary | ICD-10-CM | POA: Diagnosis not present

## 2023-05-14 DIAGNOSIS — H353211 Exudative age-related macular degeneration, right eye, with active choroidal neovascularization: Secondary | ICD-10-CM | POA: Diagnosis not present

## 2023-05-14 DIAGNOSIS — F411 Generalized anxiety disorder: Secondary | ICD-10-CM | POA: Diagnosis not present

## 2023-05-14 DIAGNOSIS — R112 Nausea with vomiting, unspecified: Secondary | ICD-10-CM | POA: Diagnosis not present

## 2023-05-14 DIAGNOSIS — I10 Essential (primary) hypertension: Secondary | ICD-10-CM | POA: Diagnosis not present

## 2023-05-14 DIAGNOSIS — F3341 Major depressive disorder, recurrent, in partial remission: Secondary | ICD-10-CM | POA: Diagnosis not present

## 2023-05-27 NOTE — Progress Notes (Signed)
[facility] - Clinic Note  06/03/2023    CHIEF COMPLAINT Patient presents for Retina Follow Up   HISTORY OF PRESENT ILLNESS: [name] is a 82 y.o. female who presents to the clinic today for:   HPI     Retina Follow Up   Patient presents with  Wet AMD.  In right eye.  This started 6 weeks ago.  Duration of 6 weeks.  Since onset it is stable.  I, the attending physician,  performed the HPI with the patient and updated documentation appropriately.        Comments   6 week retina follow up ARMD OD  and I'VE OD pt is reporting is reporting no vision changes noticed she denies any flashes a few floaters       Last edited by Rennis Chris, MD on 06/03/2023  5:02 PM.       Referring physician: Soundra Pilon, FNP [address] [place],  [place] [address]  HISTORICAL INFORMATION:   Selected notes from the MEDICAL RECORD NUMBER Referred by Dr. Zenaida Niece for ex ARMD LEE:  Ocular Hx- PMH-    CURRENT MEDICATIONS: No current outpatient medications on file. (Ophthalmic Drugs)   No current facility-administered medications for this visit. (Ophthalmic Drugs)   Current Outpatient Medications (Other)  Medication Sig   acetaminophen (TYLENOL) 500 MG tablet Take 1 tablet (500 mg total) by mouth every 8 (eight) hours as needed.   amoxicillin-clavulanate (AUGMENTIN) 875-125 MG tablet Take 1 tablet by mouth every 12 (twelve) hours. (Patient not taking: Reported on 08/19/2022)   azelastine (ASTELIN) 0.1 % nasal spray USE TWO SPRAYS IN EACH NOSTRIL TWICE DAILY AS DIRECTED (Patient not taking: Reported on 10/15/2022)   Bilberry, Vaccinium myrtillus, (BILBERRY PO) Take 1 capsule by mouth daily.   cephALEXin (KEFLEX) 500 MG capsule Take 1 capsule (500 mg total) by mouth 3 (three) times daily.   Cholecalciferol (D3-1000 PO) Take by mouth.   cyclobenzaprine (FLEXERIL) 10 MG tablet Take 1 tablet (10 mg total) by mouth 2 (two) times daily as needed for muscle spasms.    fluticasone (FLONASE) 50 MCG/ACT nasal spray Place 2 sprays into both nostrils daily.   ibuprofen (ADVIL) 400 MG tablet Take 1 tablet (400 mg total) by mouth every 8 (eight) hours as needed.   lidocaine 4 % Place 1 patch onto the skin daily.   metoprolol succinate (TOPROL-XL) 50 MG 24 hr tablet daily. Takes for headaches   Multiple Vitamins-Minerals (ICAPS AREDS 2 PO) Take by mouth daily.   Multiple Vitamins-Minerals (ICAPS LUTEIN & ZEAXANTHIN PO) Take by mouth.   omeprazole (PRILOSEC) 40 MG capsule Take 1 capsule by mouth daily.   oxyCODONE-acetaminophen (PERCOCET/ROXICET) 5-325 MG tablet Take 1 tablet by mouth every 6 (six) hours as needed for severe pain.   rosuvastatin (CRESTOR) 10 MG tablet    terbinafine (LAMISIL) 250 MG tablet Take 1 tablet (250 mg total) by mouth daily.   venlafaxine XR (EFFEXOR-XR) 75 MG 24 hr capsule TAKE ONE CAPSULE BY MOUTH ONCE DAILY WITH  BREAKFAST   No current facility-administered medications for this visit. (Other)   REVIEW OF SYSTEMS: ROS   Positive for: Eyes, Psychiatric Negative for: Constitutional, Gastrointestinal, Neurological, Skin, Genitourinary, Musculoskeletal, HENT, Endocrine, Cardiovascular, Respiratory, Allergic/Imm, Heme/Lymph Last edited by Etheleen Mayhew, COT on 06/03/2023  1:22 PM.      ALLERGIES Allergies  Allergen Reactions   Levaquin [Levofloxacin In D5w] Nausea Only    Dizziness and tremulousness   PAST  MEDICAL HISTORY Past Medical History:  Diagnosis Date   Actinic keratosis 12/03/2012   Allergy    Anxiety    Effexor helps--psychiatrist Dr Evelene Croon   Arthritis    osteoarthritis, hands   Atypical chest pain    EF 86%, breast attenuation, no significant ischemia   Cataract    Cervical radiculopathy 11/18/2013   Chronic headache    takes Metoprolol for this   Diverticulosis    Erosive esophagitis    GERD (gastroesophageal reflux disease) 11/2009   EGD: Dr. 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   Past Surgical History:  Procedure Laterality Date   AUGMENTATION MAMMAPLASTY Bilateral    silicone gel implants   Birth mark removed     CATARACT EXTRACTION Bilateral    COLONOSCOPY     COLPOSCOPY     laser vaporization of upper vagina  1998   NM MYOCAR PERF WALL MOTION  01/24/2008   EF 86% neg ischemia   VAGINAL HYSTERECTOMY  1978   FAMILY HISTORY Family History  Problem Relation Age of Onset   Heart disease Mother    Heart failure Mother    Breast cancer Mother 33   Prostate cancer Father    Inflammatory bowel disease Sister    Diabetes Maternal Aunt    Colon cancer Paternal Grandfather    Stomach cancer Neg Hx    Pancreatic cancer Neg Hx    Esophageal cancer Neg Hx    Rectal cancer Neg Hx    SOCIAL HISTORY Social History   Tobacco Use   Smoking status: Former    Packs/day: 1.00    Years: 10.00    Additional pack years: 0.00    Total pack years: 10.00    Types: Cigarettes    Quit date: 12/15/1988    Years since quitting: 34.4   Smokeless tobacco: Never   Tobacco comments:    quit in the 1980's  Vaping Use   Vaping Use: Never used  Substance Use Topics   Alcohol use: Not Currently    Alcohol/week: 14.0 standard drinks of alcohol    Types: 14 Standard drinks or equivalent per week    Comment: drinks wine every day; 2 drinks a day    Drug use: No       OPHTHALMIC EXAM:  Base Eye Exam     Visual Acuity (Snellen - Linear)       Right Left   Dist [place] 20/30 -2 20/50 -1   Dist ph [place] NI 20/30         Tonometry (Tonopen, 1:30 PM)       Right Left   Pressure 10 12         Pupils       Pupils Dark Light Shape React APD    Right PERRL 4 3 Round Brisk None   Left PERRL 4 3 Round Brisk None         Visual Fields       Left Right    Full Full         Extraocular Movement       Right  Left    Full, Ortho Full, Ortho         Neuro/Psych     Oriented x3: Yes   Mood/Affect: Normal         Dilation     Both eyes: 2.5% Phenylephrine @ 1:30 PM           Slit Lamp and Fundus Exam     Slit Lamp Exam       Right Left   Lids/Lashes Dermatochalasis - upper lid, mild MGD Dermatochalasis - upper lid, mild MGD   Conjunctiva/Sclera nasal pingeucula White and quiet   Cornea arcus, trace PEE, well healed cataract wound arcus, 1+PEE, well healed cataract wound, mild tear film debris   Anterior Chamber deep, clear, narrow temporal angle Deep and quiet   Iris Round and dilated Round and dilated   Lens PC IOL in good position, trace Posterior capsular opacification PC IOL in good position with open PC   Anterior Vitreous syneresis, Posterior vitreous detachment, vitreous condensations syneresis, Posterior vitreous detachment         Fundus Exam       Right Left   Disc Pink and Sharp Pink and Sharp, +PPP ST rim   C/D Ratio 0.2 0.2   Macula Blunted foveal reflex, central edema / shallow SRF -- stably improved, no heme, +drusen, RPE mottling Blunted foveal reflex, Drusen, RPE mottling and clumping, No heme or edema   Vessels attenuated, mild AV crossing changes, Tortuous mild attenuation, mild tortuosity   Periphery Attached, mild midzonal drusen, no heme Attached, mild midzonal drusen, no heme           Refraction     Wearing Rx       Sphere Cylinder Axis Add   Right -0.50 +0.50 146 +2.50   Left -1.25 +1.25 167 +2.50           IMAGING AND PROCEDURES  Imaging and Procedures for 06/03/2023  OCT, Retina - OU - Both Eyes       Right Eye Quality was good. Central Foveal Thickness: 226. Progression has been stable. Findings include normal foveal contour, no IRF, no SRF, retinal  drusen , intraretinal hyper-reflective material, pigment epithelial detachment, outer retinal atrophy (stable improvement in central SRF overlying stable low PED temporal fovea; +ORA, ?early tr [facility]).   Left Eye Quality was good. Central Foveal Thickness: 281. Progression has been stable. Findings include normal foveal contour, no IRF, no SRF, retinal drusen , outer retinal atrophy (Patchy ORA).   Notes *Images captured and stored on drive  Diagnosis / Impression:  OD: stable improvement in central SRF overlying stable low PED temporal fovea; +ORA, ?early tr SRHM OS: non-exu ARMD, patchy ORA  Clinical management:  See below  Abbreviations: NFP - Normal foveal profile. CME - cystoid macular edema. PED - pigment epithelial detachment. IRF - intraretinal fluid. SRF - subretinal fluid. EZ - ellipsoid zone. ERM - epiretinal membrane. ORA - outer retinal atrophy. ORT - outer retinal tubulation. SRHM - subretinal hyper-reflective material. IRHM - intraretinal hyper-reflective material      Intravitreal Injection, Pharmacologic Agent - OD - Right Eye       Time Out 06/03/2023. 2:08 PM. Confirmed correct patient, procedure, site, and patient consented.   Anesthesia Topical anesthesia was used. Anesthetic medications included Lidocaine 2%, Proparacaine 0.5%.   Procedure Preparation included 5% betadine to ocular surface, eyelid speculum. A (32g) needle was used.   Injection: 2 mg aflibercept 2 MG/0.05ML   Route: Intravitreal, Site: Right Eye  NDC: L6038910, Lot: [id number], Expiration date: 05/14/2024, Waste: 0 mL   Post-op Post injection exam found visual acuity of at least counting fingers. The patient tolerated the procedure well. There were no complications. The patient received written and verbal post procedure care education. Post injection medications were not given.            ASSESSMENT/PLAN:    ICD-10-CM   1. Exudative age-related macular degeneration of right eye  with active choroidal neovascularization (HCC)  H35.3211 OCT, Retina - OU - Both Eyes    Intravitreal Injection, Pharmacologic Agent - OD - Right Eye    aflibercept (EYLEA) SOLN 2 mg    2. Intermediate stage nonexudative age-related macular degeneration of left eye  H35.3122     3. Essential hypertension  I10     4. Hypertensive retinopathy of both eyes  H35.033     5. 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, right eye   - s/p IVA OD #1 (04.03.23), #2 (05.08.23), #3 (06.05.23), #4(07.11.23), #5 (08.22.23) - s/p IVE OD #1 (09.23.23), #2 (10.11.23) #3 (12.06.23), #4 (01.19.24), #5 (02.22.24), #6 (03.15.24), #7 (05.08.24)  - at initial visit, pt reported 1-2 mo history of central "spot" in vision OD - FA (04.03.23) shows +CNV with mild late leakage IT fovea, no obvious smokestack or expansile dot **interval increase in SRF on Avastin noted on OCT at 6 weeks on 08.22.23 on Eylea at 6 wks on 01.19.24** - BCVA stable 20/30 - stable - OCT shows stable improvement in central SRF overlying stable low PED temporal fovea, +ORA, ?early tr SRHM at 6 weeks  - pt reports high stress and +steroid use -- ?CSR component                             - continue AREDS2  - recommend IVE OD #8 today, 06.19.24 - with follow up ext to 7 weeks  - pt wishes to be treated with IVE OD  - RBA of procedure discussed, questions answered - IVE OD informed consent obtained and signed on 09.29.23 - IVA OD informed consent obtained and signed on 04.03.23 - see procedure note   - f/u in 7 wks, DFE, OCT, possible injection  2. Age related macular degeneration, non-exudative, left eye - The incidence, anatomy, and pathology of dry AMD, risk of progression, and the AREDS 2 study including smoking risks discussed with patient.  - Recommend amsler grid monitoring weekly  - monitor  3,4. Hypertensive retinopathy OU - discussed importance of tight BP control - continue to  monitor  5. Pseudophakia OU  - s/p CE/IOL (Dr. Hortense Ramal)  - IOL in good position, doing well  - continue to monitor  Ophthalmic Meds Ordered this visit:  Meds ordered this encounter  Medications   aflibercept (EYLEA) SOLN 2 mg     This document serves as a record of services personally performed by Karie Chimera, MD, PhD. It was created on their behalf by De Blanch, an ophthalmic technician. The creation of this record is the provider's dictation and/or activities during the visit.    Electronically signed by: De Blanch, OA, 06/03/23  5:03 PM  This document serves as a record of services personally performed by Karie Chimera, MD, PhD. It was created on their behalf by Glee Arvin. Manson Passey, OA an ophthalmic technician. The creation of this record is the provider's dictation and/or activities during  the visit.    Electronically signed by: Glee Arvin. Manson Passey, [place] 06.19.2024 5:03 PM  Karie Chimera, M.D., Ph.D. Diseases & Surgery of the Retina and Vitreous [facility]  I have reviewed the above documentation for accuracy and completeness, and I agree with the above. Karie Chimera, M.D., Ph.D. 06/03/23 5:04 PM   Abbreviations: M myopia (nearsighted); A astigmatism; H hyperopia (farsighted); P presbyopia; Mrx spectacle prescription;  CTL contact lenses; OD right eye; OS left eye; OU both eyes  XT exotropia; ET esotropia; PEK punctate epithelial keratitis; PEE punctate epithelial erosions; DES dry eye syndrome; MGD meibomian gland dysfunction; ATs artificial tears; PFAT's preservative free artificial tears; NSC nuclear sclerotic cataract; PSC posterior subcapsular cataract; ERM epi-retinal membrane; PVD posterior vitreous detachment; RD retinal detachment; DM diabetes mellitus; DR diabetic retinopathy; NPDR non-proliferative diabetic retinopathy; PDR proliferative diabetic retinopathy; CSME clinically significant macular edema; DME diabetic macular edema; dbh  dot blot hemorrhages; CWS cotton wool spot; POAG primary open angle glaucoma; C/D cup-to-disc ratio; HVF humphrey visual field; GVF goldmann visual field; OCT optical coherence tomography; IOP intraocular pressure; BRVO Branch retinal vein occlusion; CRVO central retinal vein occlusion; CRAO central retinal artery occlusion; BRAO branch retinal artery occlusion; RT retinal tear; SB scleral buckle; PPV pars plana vitrectomy; VH Vitreous hemorrhage; PRP panretinal laser photocoagulation; IVK intravitreal kenalog; VMT vitreomacular traction; MH Macular hole;  NVD neovascularization of the disc; NVE neovascularization elsewhere; AREDS age related eye disease study; ARMD age related macular degeneration; POAG primary open angle glaucoma; EBMD epithelial/anterior basement membrane dystrophy; ACIOL anterior chamber intraocular lens; IOL intraocular lens; PCIOL posterior chamber intraocular lens; Phaco/IOL phacoemulsification with intraocular lens placement; PRK photorefractive keratectomy; LASIK laser assisted in situ keratomileusis; HTN hypertension; DM diabetes mellitus; COPD chronic obstructive pulmonary disease

## 2023-06-03 ENCOUNTER — Encounter (INDEPENDENT_AMBULATORY_CARE_PROVIDER_SITE_OTHER): Payer: Self-pay | Admitting: Ophthalmology

## 2023-06-03 ENCOUNTER — Ambulatory Visit (INDEPENDENT_AMBULATORY_CARE_PROVIDER_SITE_OTHER): Payer: Medicare HMO | Admitting: Ophthalmology

## 2023-06-03 DIAGNOSIS — Z961 Presence of intraocular lens: Secondary | ICD-10-CM

## 2023-06-03 DIAGNOSIS — I10 Essential (primary) hypertension: Secondary | ICD-10-CM | POA: Diagnosis not present

## 2023-06-03 DIAGNOSIS — H353122 Nonexudative age-related macular degeneration, left eye, intermediate dry stage: Secondary | ICD-10-CM | POA: Diagnosis not present

## 2023-06-03 DIAGNOSIS — H35033 Hypertensive retinopathy, bilateral: Secondary | ICD-10-CM | POA: Diagnosis not present

## 2023-06-03 DIAGNOSIS — H353211 Exudative age-related macular degeneration, right eye, with active choroidal neovascularization: Secondary | ICD-10-CM

## 2023-06-03 MED ORDER — AFLIBERCEPT 2MG/0.05ML IZ SOLN FOR KALEIDOSCOPE
2.0000 mg | INTRAVITREAL | Status: AC | PRN
  Administered 2023-06-03: 2 mg via INTRAVITREAL

## 2023-06-04 DIAGNOSIS — M25512 Pain in left shoulder: Secondary | ICD-10-CM | POA: Diagnosis not present

## 2023-06-04 DIAGNOSIS — M545 Low back pain, unspecified: Secondary | ICD-10-CM | POA: Diagnosis not present

## 2023-06-04 DIAGNOSIS — F3341 Major depressive disorder, recurrent, in partial remission: Secondary | ICD-10-CM | POA: Diagnosis not present

## 2023-06-04 DIAGNOSIS — W19XXXA Unspecified fall, initial encounter: Secondary | ICD-10-CM | POA: Diagnosis not present

## 2023-06-24 DIAGNOSIS — M79672 Pain in left foot: Secondary | ICD-10-CM | POA: Diagnosis not present

## 2023-06-29 ENCOUNTER — Ambulatory Visit
Admission: RE | Admit: 2023-06-29 | Discharge: 2023-06-29 | Disposition: A | Discharge status: Home / Self Care | Payer: Medicare HMO | Source: Ambulatory Visit | Attending: Family Medicine | Admitting: Family Medicine

## 2023-06-29 ENCOUNTER — Encounter: Payer: Self-pay | Admitting: Family Medicine

## 2023-06-29 DIAGNOSIS — R413 Other amnesia: Secondary | ICD-10-CM | POA: Diagnosis not present

## 2023-06-29 DIAGNOSIS — H353211 Exudative age-related macular degeneration, right eye, with active choroidal neovascularization: Secondary | ICD-10-CM

## 2023-06-29 DIAGNOSIS — R42 Dizziness and giddiness: Secondary | ICD-10-CM | POA: Diagnosis not present

## 2023-06-29 MED ORDER — GADOPICLENOL 0.5 MMOL/ML IV SOLN
6.0000 mL | Freq: Once | INTRAVENOUS | Status: AC | PRN
  Administered 2023-06-29: 6 mL via INTRAVENOUS

## 2023-07-15 NOTE — Progress Notes (Signed)
[facility] - Clinic Note  07/22/2023    CHIEF COMPLAINT Patient presents for Retina Follow Up   HISTORY OF PRESENT ILLNESS: [name] is a 82 y.o. female who presents to the clinic today for:   HPI     Retina Follow Up   Patient presents with  Wet AMD.  In right eye.  This started 7 weeks ago.  Duration of 7 weeks.  Since onset it is stable.  I, the attending physician,  performed the HPI with the patient and updated documentation appropriately.        Comments   Retia follow up ARMD and I'VE OD pt is reporting no vision changes noticed she denies any flashes or floaters      Last edited by Rennis Chris, MD on 07/22/2023  5:36 PM.      Referring physician: Soundra Pilon, FNP [address] [place],  [place] [address]  HISTORICAL INFORMATION:   Selected notes from the MEDICAL RECORD NUMBER Referred by Dr. Zenaida Niece for ex ARMD LEE:  Ocular Hx- PMH-    CURRENT MEDICATIONS: No current outpatient medications on file. (Ophthalmic Drugs)   No current facility-administered medications for this visit. (Ophthalmic Drugs)   Current Outpatient Medications (Other)  Medication Sig   acetaminophen (TYLENOL) 500 MG tablet Take 1 tablet (500 mg total) by mouth every 8 (eight) hours as needed.   amoxicillin-clavulanate (AUGMENTIN) 875-125 MG tablet Take 1 tablet by mouth every 12 (twelve) hours. (Patient not taking: Reported on 08/19/2022)   azelastine (ASTELIN) 0.1 % nasal spray USE TWO SPRAYS IN EACH NOSTRIL TWICE DAILY AS DIRECTED (Patient not taking: Reported on 10/15/2022)   Bilberry, Vaccinium myrtillus, (BILBERRY PO) Take 1 capsule by mouth daily.   cephALEXin (KEFLEX) 500 MG capsule Take 1 capsule (500 mg total) by mouth 3 (three) times daily.   Cholecalciferol (D3-1000 PO) Take by mouth.   cyclobenzaprine (FLEXERIL) 10 MG tablet Take 1 tablet (10 mg total) by mouth 2 (two) times daily as needed for muscle spasms.   fluticasone (FLONASE) 50 MCG/ACT  nasal spray Place 2 sprays into both nostrils daily.   ibuprofen (ADVIL) 400 MG tablet Take 1 tablet (400 mg total) by mouth every 8 (eight) hours as needed.   lidocaine 4 % Place 1 patch onto the skin daily.   metoprolol succinate (TOPROL-XL) 50 MG 24 hr tablet daily. Takes for headaches   Multiple Vitamins-Minerals (ICAPS AREDS 2 PO) Take by mouth daily.   Multiple Vitamins-Minerals (ICAPS LUTEIN & ZEAXANTHIN PO) Take by mouth.   omeprazole (PRILOSEC) 40 MG capsule Take 1 capsule by mouth daily.   oxyCODONE-acetaminophen (PERCOCET/ROXICET) 5-325 MG tablet Take 1 tablet by mouth every 6 (six) hours as needed for severe pain.   rosuvastatin (CRESTOR) 10 MG tablet    terbinafine (LAMISIL) 250 MG tablet Take 1 tablet (250 mg total) by mouth daily.   venlafaxine XR (EFFEXOR-XR) 75 MG 24 hr capsule TAKE ONE CAPSULE BY MOUTH ONCE DAILY WITH  BREAKFAST   No current facility-administered medications for this visit. (Other)   REVIEW OF SYSTEMS: ROS   Positive for: Eyes, Psychiatric Negative for: Constitutional, Gastrointestinal, Neurological, Skin, Genitourinary, Musculoskeletal, HENT, Endocrine, Cardiovascular, Respiratory, Allergic/Imm, Heme/Lymph Last edited by Etheleen Mayhew, COT on 07/22/2023  1:31 PM.       ALLERGIES Allergies  Allergen Reactions   Levaquin [Levofloxacin In D5w] Nausea Only    Dizziness and tremulousness   PAST MEDICAL HISTORY Past Medical History:  Diagnosis Date  Actinic keratosis 12/03/2012   Allergy    Anxiety    Effexor helps--psychiatrist Dr Evelene Croon   Arthritis    osteoarthritis, hands   Atypical chest pain    EF 86%, breast attenuation, no significant ischemia   Cataract    Cervical radiculopathy 11/18/2013   Chronic headache    takes Metoprolol for this   Diverticulosis    Erosive esophagitis    GERD (gastroesophageal reflux disease) 11/2009   EGD: Dr. 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   Past Surgical History:  Procedure Laterality Date   AUGMENTATION MAMMAPLASTY Bilateral    silicone gel implants   Birth mark removed     CATARACT EXTRACTION Bilateral    COLONOSCOPY     COLPOSCOPY     laser vaporization of upper vagina  1998   NM MYOCAR PERF WALL MOTION  01/24/2008   EF 86% neg ischemia   VAGINAL HYSTERECTOMY  1978   FAMILY HISTORY Family History  Problem Relation Age of Onset   Heart disease Mother    Heart failure Mother    Breast cancer Mother 66   Prostate cancer Father    Inflammatory bowel disease Sister    Diabetes Maternal Aunt    Colon cancer Paternal Grandfather    Stomach cancer Neg Hx    Pancreatic cancer Neg Hx    Esophageal cancer Neg Hx    Rectal cancer Neg Hx    SOCIAL HISTORY Social History   Tobacco Use   Smoking status: Former    Current packs/day: 0.00    Average packs/day: 1 pack/day for 10.0 years (10.0 ttl pk-yrs)    Types: Cigarettes    Start date: 12/15/1978    Quit date: 12/15/1988    Years since quitting: 34.6   Smokeless tobacco: Never   Tobacco comments:    quit in the 1980's  Vaping Use   Vaping status: Never Used  Substance Use Topics   Alcohol use: Not Currently    Alcohol/week: 14.0 standard drinks of alcohol    Types: 14 Standard drinks or equivalent per week    Comment: drinks wine every day; 2 drinks a day    Drug use: No       OPHTHALMIC EXAM:  Base Eye Exam     Visual Acuity (Snellen - Linear)       Right Left   Dist [place] 20/40 -2 20/40   Dist ph [place] 20/30 NI         Tonometry (Tonopen, 1:36 PM)       Right Left   Pressure 16 18         Pupils       Pupils Dark Light Shape React APD   Right  PERRL 4 3 Round Brisk None   Left PERRL 4 3 Round Brisk None         Visual Fields       Left Right    Full Full         Extraocular Movement       Right Left    Full, Ortho Full, Ortho  Neuro/Psych     Oriented x3: Yes   Mood/Affect: Normal         Dilation     Both eyes: 2.5% Phenylephrine @ 1:36 PM           Slit Lamp and Fundus Exam     Slit Lamp Exam       Right Left   Lids/Lashes Dermatochalasis - upper lid, mild MGD Dermatochalasis - upper lid, mild MGD   Conjunctiva/Sclera nasal pingeucula White and quiet   Cornea arcus, trace PEE, well healed cataract wound arcus, 1+PEE, well healed cataract wound, mild tear film debris   Anterior Chamber deep, clear, narrow temporal angle Deep and quiet   Iris Round and dilated Round and dilated   Lens PC IOL in good position, trace Posterior capsular opacification PC IOL in good position with open PC   Anterior Vitreous syneresis, Posterior vitreous detachment, vitreous condensations syneresis, Posterior vitreous detachment         Fundus Exam       Right Left   Disc Pink and Sharp Pink and Sharp, +PPP ST rim   C/D Ratio 0.2 0.2   Macula Blunted foveal reflex, central edema / shallow SRF -- stably improved, no heme, +drusen, RPE mottling Blunted foveal reflex, Drusen, RPE mottling and clumping, No heme or edema   Vessels attenuated, Tortuous attenuated, Tortuous   Periphery Attached, mild midzonal drusen, no heme Attached, mild midzonal drusen, no heme           Refraction     Wearing Rx       Sphere Cylinder Axis Add   Right -0.50 +0.50 146 +2.50   Left -1.25 +1.25 167 +2.50           IMAGING AND PROCEDURES  Imaging and Procedures for 07/22/2023  OCT, Retina - OU - Both Eyes       Right Eye Quality was good. Central Foveal Thickness: 234. Progression has been stable. Findings include normal foveal contour, no IRF, no SRF, retinal drusen , intraretinal hyper-reflective material,  pigment epithelial detachment, outer retinal atrophy (stable improvement in central SRF overlying stable low PED temporal fovea; patchy ORA).   Left Eye Quality was good. Central Foveal Thickness: 282. Progression has been stable. Findings include normal foveal contour, no IRF, no SRF, retinal drusen , outer retinal atrophy (Patchy ORA).   Notes *Images captured and stored on drive  Diagnosis / Impression:  OD: stable improvement in central SRF overlying stable low PED temporal fovea; patchy ORA OS: non-exu ARMD, patchy ORA  Clinical management:  See below  Abbreviations: NFP - Normal foveal profile. CME - cystoid macular edema. PED - pigment epithelial detachment. IRF - intraretinal fluid. SRF - subretinal fluid. EZ - ellipsoid zone. ERM - epiretinal membrane. ORA - outer retinal atrophy. ORT - outer retinal tubulation. SRHM - subretinal hyper-reflective material. IRHM - intraretinal hyper-reflective material      Intravitreal Injection, Pharmacologic Agent - OD - Right Eye       Time Out 07/22/2023. 2:19 PM. Confirmed correct patient, procedure, site, and patient consented.   Anesthesia Topical anesthesia was used. Anesthetic medications included Lidocaine 2%, Proparacaine 0.5%.   Procedure Preparation included 5% betadine to ocular surface, eyelid speculum. A (32g) needle was used.   Injection: 2 mg aflibercept 2 MG/0.05ML   Route: Intravitreal, Site: Right Eye   NDC: L6038910, Lot: [id number], Expiration date: 03/14/2024, Waste: 0 mL   Post-op Post injection exam found visual acuity of at least counting fingers.  The patient tolerated the procedure well. There were no complications. The patient received written and verbal post procedure care education. Post injection medications were not given.            ASSESSMENT/PLAN:    ICD-10-CM   1. Exudative age-related macular degeneration of right eye with active choroidal neovascularization (HCC)  H35.3211 OCT, Retina -  OU - Both Eyes    Intravitreal Injection, Pharmacologic Agent - OD - Right Eye    aflibercept (EYLEA) SOLN 2 mg    2. Intermediate stage nonexudative age-related macular degeneration of left eye  H35.3122     3. Essential hypertension  I10     4. Hypertensive retinopathy of both eyes  H35.033     5. 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, right eye   - s/p IVA OD #1 (04.03.23), #2 (05.08.23), #3 (06.05.23), #4(07.11.23), #5 (08.22.23) - s/p IVE OD #1 (09.23.23), #2 (10.11.23) #3 (12.06.23), #4 (01.19.24), #5 (02.22.24), #6 (03.15.24), #7 (05.08.24), #8 (06.19.24)  - at initial visit, pt reported 1-2 mo history of central "spot" in vision OD - FA (04.03.23) shows +CNV with mild late leakage IT fovea, no obvious smokestack or expansile dot **interval increase in SRF on Avastin noted on OCT at 6 weeks on 08.22.23 on Eylea at 6 wks on 01.19.24** - BCVA stable 20/30 - stable - OCT shows stable improvement in central SRF overlying stable low PED temporal fovea, patchy ORA at 7 weeks  - pt reports high stress and +steroid use -- ?CSR component                             - continue AREDS2  - recommend IVE OD #9 today, 08.07.24 - with follow up ext to 8 weeks  - pt wishes to be treated with IVE OD  - RBA of procedure discussed, questions answered - IVE OD informed consent obtained and signed on 09.29.23 - see procedure note   - f/u in 8 wks, DFE, OCT, possible injection  2. Age related macular degeneration, non-exudative, left eye - The incidence, anatomy, and pathology of dry AMD, risk of progression, and the AREDS 2 study including smoking risks discussed with patient.  - Recommend amsler grid monitoring weekly  - monitor  3,4. Hypertensive retinopathy OU - discussed importance of tight BP control - continue to monitor  5. Pseudophakia OU  - s/p CE/IOL (Dr. Hortense Ramal)  - IOL in good position, doing well  - continue to monitor  Ophthalmic  Meds Ordered this visit:  Meds ordered this encounter  Medications   aflibercept (EYLEA) SOLN 2 mg     This document serves as a record of services personally performed by Karie Chimera, MD, PhD. It was created on their behalf by De Blanch, an ophthalmic technician. The creation of this record is the provider's dictation and/or activities during the visit.    Electronically signed by: De Blanch, OA, 07/22/23  5:36 PM  This document serves as a record of services personally performed by Karie Chimera, MD, PhD. It was created on their behalf by Glee Arvin. Manson Passey, OA an ophthalmic technician. The creation of this record is the provider's dictation and/or activities during the visit.    Electronically signed by: Glee Arvin. Manson Passey, OA 07/22/23 5:36 PM  Karie Chimera, M.D., Ph.D. Diseases & Surgery of the Retina and Vitreous [facility]  I have reviewed the above documentation for accuracy and completeness, and I agree with the above. Karie Chimera, M.D., Ph.D. 07/22/23 5:37 PM   Abbreviations: M myopia (nearsighted); A astigmatism; H hyperopia (farsighted); P presbyopia; Mrx spectacle prescription;  CTL contact lenses; OD right eye; OS left eye; OU both eyes  XT exotropia; ET esotropia; PEK punctate epithelial keratitis; PEE punctate epithelial erosions; DES dry eye syndrome; MGD meibomian gland dysfunction; ATs artificial tears; PFAT's preservative free artificial tears; NSC nuclear sclerotic cataract; PSC posterior subcapsular cataract; ERM epi-retinal membrane; PVD posterior vitreous detachment; RD retinal detachment; DM diabetes mellitus; DR diabetic retinopathy; NPDR non-proliferative diabetic retinopathy; PDR proliferative diabetic retinopathy; CSME clinically significant macular edema; DME diabetic macular edema; dbh dot blot hemorrhages; CWS cotton wool spot; POAG primary open angle glaucoma; C/D cup-to-disc ratio; HVF humphrey visual field; GVF  goldmann visual field; OCT optical coherence tomography; IOP intraocular pressure; BRVO Branch retinal vein occlusion; CRVO central retinal vein occlusion; CRAO central retinal artery occlusion; BRAO branch retinal artery occlusion; RT retinal tear; SB scleral buckle; PPV pars plana vitrectomy; VH Vitreous hemorrhage; PRP panretinal laser photocoagulation; IVK intravitreal kenalog; VMT vitreomacular traction; MH Macular hole;  NVD neovascularization of the disc; NVE neovascularization elsewhere; AREDS age related eye disease study; ARMD age related macular degeneration; POAG primary open angle glaucoma; EBMD epithelial/anterior basement membrane dystrophy; ACIOL anterior chamber intraocular lens; IOL intraocular lens; PCIOL posterior chamber intraocular lens; Phaco/IOL phacoemulsification with intraocular lens placement; PRK photorefractive keratectomy; LASIK laser assisted in situ keratomileusis; HTN hypertension; DM diabetes mellitus; COPD chronic obstructive pulmonary disease

## 2023-07-21 DIAGNOSIS — R413 Other amnesia: Secondary | ICD-10-CM | POA: Diagnosis not present

## 2023-07-21 DIAGNOSIS — I10 Essential (primary) hypertension: Secondary | ICD-10-CM | POA: Diagnosis not present

## 2023-07-21 DIAGNOSIS — F3341 Major depressive disorder, recurrent, in partial remission: Secondary | ICD-10-CM | POA: Diagnosis not present

## 2023-07-22 ENCOUNTER — Ambulatory Visit (INDEPENDENT_AMBULATORY_CARE_PROVIDER_SITE_OTHER): Payer: Medicare HMO | Admitting: Ophthalmology

## 2023-07-22 ENCOUNTER — Encounter (INDEPENDENT_AMBULATORY_CARE_PROVIDER_SITE_OTHER): Payer: Self-pay | Admitting: Ophthalmology

## 2023-07-22 DIAGNOSIS — I10 Essential (primary) hypertension: Secondary | ICD-10-CM | POA: Diagnosis not present

## 2023-07-22 DIAGNOSIS — H353211 Exudative age-related macular degeneration, right eye, with active choroidal neovascularization: Secondary | ICD-10-CM

## 2023-07-22 DIAGNOSIS — H35033 Hypertensive retinopathy, bilateral: Secondary | ICD-10-CM

## 2023-07-22 DIAGNOSIS — Z961 Presence of intraocular lens: Secondary | ICD-10-CM | POA: Diagnosis not present

## 2023-07-22 DIAGNOSIS — H353122 Nonexudative age-related macular degeneration, left eye, intermediate dry stage: Secondary | ICD-10-CM

## 2023-07-22 MED ORDER — AFLIBERCEPT 2MG/0.05ML IZ SOLN FOR KALEIDOSCOPE
2.0000 mg | INTRAVITREAL | Status: AC | PRN
  Administered 2023-07-22: 2 mg via INTRAVITREAL

## 2023-07-24 DIAGNOSIS — M25572 Pain in left ankle and joints of left foot: Secondary | ICD-10-CM | POA: Diagnosis not present

## 2023-09-08 NOTE — Progress Notes (Signed)
[facility] - Clinic Note  09/16/2023    CHIEF COMPLAINT Patient presents for Retina Follow Up   HISTORY OF PRESENT ILLNESS: [name] is a 82 y.o. female who presents to the clinic today for:   HPI     Retina Follow Up   Patient presents with  Wet AMD.  In both eyes.  This started 8 weeks ago.  Duration of 8 weeks.  Since onset it is stable.  I, the attending physician,  performed the HPI with the patient and updated documentation appropriately.        Comments   8 week retina follow up ARMD OD and IVE OD pt is reporting no vision changes noticed she denies any flashes or floaters       Last edited by Rennis Chris, MD on 09/16/2023 10:41 PM.    Pt states vision is doing well   Referring physician: Soundra Pilon, FNP [address] [place],  [place] [address]  HISTORICAL INFORMATION:   Selected notes from the MEDICAL RECORD NUMBER Referred by Dr. Zenaida Niece for ex ARMD LEE:  Ocular Hx- PMH-    CURRENT MEDICATIONS: No current outpatient medications on file. (Ophthalmic Drugs)   No current facility-administered medications for this visit. (Ophthalmic Drugs)   Current Outpatient Medications (Other)  Medication Sig   acetaminophen (TYLENOL) 500 MG tablet Take 1 tablet (500 mg total) by mouth every 8 (eight) hours as needed.   amoxicillin-clavulanate (AUGMENTIN) 875-125 MG tablet Take 1 tablet by mouth every 12 (twelve) hours. (Patient not taking: Reported on 08/19/2022)   azelastine (ASTELIN) 0.1 % nasal spray USE TWO SPRAYS IN EACH NOSTRIL TWICE DAILY AS DIRECTED (Patient not taking: Reported on 10/15/2022)   Bilberry, Vaccinium myrtillus, (BILBERRY PO) Take 1 capsule by mouth daily.   cephALEXin (KEFLEX) 500 MG capsule Take 1 capsule (500 mg total) by mouth 3 (three) times daily.   Cholecalciferol (D3-1000 PO) Take by mouth.   cyclobenzaprine (FLEXERIL) 10 MG tablet Take 1 tablet (10 mg total) by mouth 2 (two) times daily as needed for muscle  spasms.   fluticasone (FLONASE) 50 MCG/ACT nasal spray Place 2 sprays into both nostrils daily.   ibuprofen (ADVIL) 400 MG tablet Take 1 tablet (400 mg total) by mouth every 8 (eight) hours as needed.   lidocaine 4 % Place 1 patch onto the skin daily.   metoprolol succinate (TOPROL-XL) 50 MG 24 hr tablet daily. Takes for headaches   Multiple Vitamins-Minerals (ICAPS AREDS 2 PO) Take by mouth daily.   Multiple Vitamins-Minerals (ICAPS LUTEIN & ZEAXANTHIN PO) Take by mouth.   omeprazole (PRILOSEC) 40 MG capsule Take 1 capsule by mouth daily.   oxyCODONE-acetaminophen (PERCOCET/ROXICET) 5-325 MG tablet Take 1 tablet by mouth every 6 (six) hours as needed for severe pain.   rosuvastatin (CRESTOR) 10 MG tablet    terbinafine (LAMISIL) 250 MG tablet Take 1 tablet (250 mg total) by mouth daily.   venlafaxine XR (EFFEXOR-XR) 75 MG 24 hr capsule TAKE ONE CAPSULE BY MOUTH ONCE DAILY WITH  BREAKFAST   No current facility-administered medications for this visit. (Other)   REVIEW OF SYSTEMS: ROS   Positive for: Eyes, Psychiatric Negative for: Constitutional, Gastrointestinal, Neurological, Skin, Genitourinary, Musculoskeletal, HENT, Endocrine, Cardiovascular, Respiratory, Allergic/Imm, Heme/Lymph Last edited by Etheleen Mayhew, COT on 09/16/2023  1:47 PM.     ALLERGIES Allergies  Allergen Reactions   Levaquin [Levofloxacin In D5w] Nausea Only    Dizziness and tremulousness   PAST MEDICAL  HISTORY Past Medical History:  Diagnosis Date   Actinic keratosis 12/03/2012   Allergy    Anxiety    Effexor helps--psychiatrist Dr Evelene Croon   Arthritis    osteoarthritis, hands   Atypical chest pain    EF 86%, breast attenuation, no significant ischemia   Cataract    Cervical radiculopathy 11/18/2013   Chronic headache    takes Metoprolol for this   Diverticulosis    Erosive esophagitis    GERD (gastroesophageal reflux disease) 11/2009   EGD: Dr. 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   Past Surgical History:  Procedure Laterality Date   AUGMENTATION MAMMAPLASTY Bilateral    silicone gel implants   Birth mark removed     CATARACT EXTRACTION Bilateral    COLONOSCOPY     COLPOSCOPY     laser vaporization of upper vagina  1998   NM MYOCAR PERF WALL MOTION  01/24/2008   EF 86% neg ischemia   VAGINAL HYSTERECTOMY  1978   FAMILY HISTORY Family History  Problem Relation Age of Onset   Heart disease Mother    Heart failure Mother    Breast cancer Mother 41   Prostate cancer Father    Inflammatory bowel disease Sister    Diabetes Maternal Aunt    Colon cancer Paternal Grandfather    Stomach cancer Neg Hx    Pancreatic cancer Neg Hx    Esophageal cancer Neg Hx    Rectal cancer Neg Hx    SOCIAL HISTORY Social History   Tobacco Use   Smoking status: Former    Current packs/day: 0.00    Average packs/day: 1 pack/day for 10.0 years (10.0 ttl pk-yrs)    Types: Cigarettes    Start date: 12/15/1978    Quit date: 12/15/1988    Years since quitting: 34.7   Smokeless tobacco: Never   Tobacco comments:    quit in the 1980's  Vaping Use   Vaping status: Never Used  Substance Use Topics   Alcohol use: Not Currently    Alcohol/week: 14.0 standard drinks of alcohol    Types: 14 Standard drinks or equivalent per week    Comment: drinks wine every day; 2 drinks a day    Drug use: No       OPHTHALMIC EXAM:  Base Eye Exam     Visual Acuity (Snellen - Linear)       Right Left   Dist [place] 20/40 -2 20/50 -2   Dist ph [place] 20/30 20/40 -1         Tonometry (Tonopen, 1:56 PM)       Right Left   Pressure 13 14          Pupils       Pupils Dark Light Shape React APD   Right PERRL 4 3 Round Brisk None   Left PERRL 4 3 Round Brisk None         Visual Fields       Left Right    Full Full         Extraocular Movement  Right Left    Full, Ortho Full, Ortho         Neuro/Psych     Oriented x3: Yes   Mood/Affect: Normal         Dilation     Both eyes: 2.5% Phenylephrine @ 1:56 PM           Slit Lamp and Fundus Exam     Slit Lamp Exam       Right Left   Lids/Lashes Dermatochalasis - upper lid, mild MGD Dermatochalasis - upper lid, mild MGD   Conjunctiva/Sclera nasal pingeucula White and quiet   Cornea arcus, trace PEE, well healed cataract wound arcus, 1+PEE, well healed cataract wound, mild tear film debris   Anterior Chamber deep, clear, narrow temporal angle Deep and quiet   Iris Round and dilated Round and dilated   Lens PC IOL in good position, trace Posterior capsular opacification PC IOL in good position with open PC   Anterior Vitreous syneresis, Posterior vitreous detachment, vitreous condensations syneresis, Posterior vitreous detachment         Fundus Exam       Right Left   Disc Pink and Sharp Pink and Sharp, +PPP SN rim   C/D Ratio 0.2 0.2   Macula Blunted foveal reflex, central edema / shallow SRF -- stably improved, no heme, +drusen, RPE mottling Blunted foveal reflex, Drusen, RPE mottling and clumping, No heme or edema   Vessels attenuated, Tortuous attenuated, Tortuous   Periphery Attached, mild midzonal drusen, no heme Attached, mild midzonal drusen, no heme           Refraction     Wearing Rx       Sphere Cylinder Axis Add   Right -0.50 +0.50 146 +2.50   Left -1.25 +1.25 167 +2.50           IMAGING AND PROCEDURES  Imaging and Procedures for 09/16/2023  OCT, Retina - OU - Both Eyes       Right Eye Quality was good. Central Foveal Thickness: 239. Progression has been stable. Findings include normal foveal contour, no IRF,  no SRF, retinal drusen , intraretinal hyper-reflective material, pigment epithelial detachment, outer retinal atrophy (stable improvement in central SRF overlying stable low PED temporal fovea; patchy ORA).   Left Eye Quality was good. Central Foveal Thickness: 267. Progression has been stable. Findings include normal foveal contour, no IRF, no SRF, retinal drusen , outer retinal atrophy (Patchy ORA).   Notes *Images captured and stored on drive  Diagnosis / Impression:  OD: stable improvement in central SRF overlying stable low PED temporal fovea; patchy ORA OS: non-exu ARMD, patchy ORA  Clinical management:  See below  Abbreviations: NFP - Normal foveal profile. CME - cystoid macular edema. PED - pigment epithelial detachment. IRF - intraretinal fluid. SRF - subretinal fluid. EZ - ellipsoid zone. ERM - epiretinal membrane. ORA - outer retinal atrophy. ORT - outer retinal tubulation. SRHM - subretinal hyper-reflective material. IRHM - intraretinal hyper-reflective material      Intravitreal Injection, Pharmacologic Agent - OD - Right Eye       Time Out 09/16/2023. 2:16 PM. Confirmed correct patient, procedure, site, and patient consented.   Anesthesia Topical anesthesia was used. Anesthetic medications included Lidocaine 2%, Proparacaine 0.5%.   Procedure Preparation included 5% betadine to ocular surface, eyelid speculum. A (32g) needle was used.   Injection: 2 mg aflibercept 2 MG/0.05ML   Route: Intravitreal, Site: Right Eye   NDC: L6038910, Lot: [id number], Expiration date: 10/14/2024,  Waste: 0 mL   Post-op Post injection exam found visual acuity of at least counting fingers. The patient tolerated the procedure well. There were no complications. The patient received written and verbal post procedure care education. Post injection medications were not given.            ASSESSMENT/PLAN:    ICD-10-CM   1. Exudative age-related macular degeneration of right eye with  active choroidal neovascularization (HCC)  H35.3211 OCT, Retina - OU - Both Eyes    Intravitreal Injection, Pharmacologic Agent - OD - Right Eye    aflibercept (EYLEA) SOLN 2 mg    2. Intermediate stage nonexudative age-related macular degeneration of left eye  H35.3122     3. Essential hypertension  I10     4. Hypertensive retinopathy of both eyes  H35.033     5. Pseudophakia, both eyes  Z96.1       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, right eye   - s/p IVA OD #1 (04.03.23), #2 (05.08.23), #3 (06.05.23), #4(07.11.23), #5 (08.22.23) -- IVA resistance - s/p IVE OD #1 (09.23.23), #2 (10.11.23) #3 (12.06.23), #4 (01.19.24), #5 (02.22.24), #6 (03.15.24), #7 (05.08.24), #8 (06.19.24), #9 (08.07.24)  - at initial visit, pt reported 1-2 mo history of central "spot" in vision OD - FA (04.03.23) shows +CNV with mild late leakage IT fovea, no obvious smokestack or expansile dot **interval increase in SRF on Avastin noted on OCT at 6 weeks on 08.22.23 on Eylea at 6 wks on 01.19.24** - BCVA stable 20/30 - stable - OCT shows stable improvement in central SRF overlying stable low PED temporal fovea, patchy ORA at 8 weeks  - pt reports high stress and +steroid use -- ?CSR component                             - continue AREDS2  - recommend IVE OD #10 today, 10.02.24 - with follow up ext to 10 weeks  - pt wishes to be treated with IVE OD  - RBA of procedure discussed, questions answered - IVE OD informed consent obtained and signed on 09.29.23 - see procedure note   - f/u in 10 wks, DFE, OCT, possible injection  2. Age related macular degeneration, non-exudative, left eye - The incidence, anatomy, and pathology of dry AMD, risk of progression, and the AREDS 2 study including smoking risks discussed with patient.  - Recommend amsler grid monitoring weekly  - monitor  3,4. Hypertensive retinopathy OU - discussed importance of tight BP control - continue to monitor  5. Pseudophakia OU  -  s/p CE/IOL (Dr. Hortense Ramal)  - IOL in good position, doing well  - continue to monitor  Ophthalmic Meds Ordered this visit:  Meds ordered this encounter  Medications   aflibercept (EYLEA) SOLN 2 mg     This document serves as a record of services personally performed by Karie Chimera, MD, PhD. It was created on their behalf by De Blanch, an ophthalmic technician. The creation of this record is the provider's dictation and/or activities during the visit.    Electronically signed by: De Blanch, OA, 09/16/23  10:42 PM  This document serves as a record of services personally performed by Karie Chimera, MD, PhD. It was created on their behalf by Glee Arvin. Manson Passey, OA an ophthalmic technician. The creation of this record is the provider's dictation and/or activities during the visit.    Electronically signed by: Glee Arvin. Manson Passey,  OA 09/16/23 10:42 PM  Karie Chimera, M.D., Ph.D. Diseases & Surgery of the Retina and Vitreous [facility]  I have reviewed the above documentation for accuracy and completeness, and I agree with the above. Karie Chimera, M.D., Ph.D. 09/16/23 10:43 PM   Abbreviations: M myopia (nearsighted); A astigmatism; H hyperopia (farsighted); P presbyopia; Mrx spectacle prescription;  CTL contact lenses; OD right eye; OS left eye; OU both eyes  XT exotropia; ET esotropia; PEK punctate epithelial keratitis; PEE punctate epithelial erosions; DES dry eye syndrome; MGD meibomian gland dysfunction; ATs artificial tears; PFAT's preservative free artificial tears; NSC nuclear sclerotic cataract; PSC posterior subcapsular cataract; ERM epi-retinal membrane; PVD posterior vitreous detachment; RD retinal detachment; DM diabetes mellitus; DR diabetic retinopathy; NPDR non-proliferative diabetic retinopathy; PDR proliferative diabetic retinopathy; CSME clinically significant macular edema; DME diabetic macular edema; dbh dot blot hemorrhages; CWS cotton  wool spot; POAG primary open angle glaucoma; C/D cup-to-disc ratio; HVF humphrey visual field; GVF goldmann visual field; OCT optical coherence tomography; IOP intraocular pressure; BRVO Branch retinal vein occlusion; CRVO central retinal vein occlusion; CRAO central retinal artery occlusion; BRAO branch retinal artery occlusion; RT retinal tear; SB scleral buckle; PPV pars plana vitrectomy; VH Vitreous hemorrhage; PRP panretinal laser photocoagulation; IVK intravitreal kenalog; VMT vitreomacular traction; MH Macular hole;  NVD neovascularization of the disc; NVE neovascularization elsewhere; AREDS age related eye disease study; ARMD age related macular degeneration; POAG primary open angle glaucoma; EBMD epithelial/anterior basement membrane dystrophy; ACIOL anterior chamber intraocular lens; IOL intraocular lens; PCIOL posterior chamber intraocular lens; Phaco/IOL phacoemulsification with intraocular lens placement; PRK photorefractive keratectomy; LASIK laser assisted in situ keratomileusis; HTN hypertension; DM diabetes mellitus; COPD chronic obstructive pulmonary disease

## 2023-09-15 DIAGNOSIS — M8580 Other specified disorders of bone density and structure, unspecified site: Secondary | ICD-10-CM | POA: Diagnosis not present

## 2023-09-15 DIAGNOSIS — I10 Essential (primary) hypertension: Secondary | ICD-10-CM | POA: Diagnosis not present

## 2023-09-15 DIAGNOSIS — Z1239 Encounter for other screening for malignant neoplasm of breast: Secondary | ICD-10-CM | POA: Diagnosis not present

## 2023-09-15 DIAGNOSIS — Z0000 Encounter for general adult medical examination without abnormal findings: Secondary | ICD-10-CM | POA: Diagnosis not present

## 2023-09-15 DIAGNOSIS — J302 Other seasonal allergic rhinitis: Secondary | ICD-10-CM | POA: Diagnosis not present

## 2023-09-15 DIAGNOSIS — F3341 Major depressive disorder, recurrent, in partial remission: Secondary | ICD-10-CM | POA: Diagnosis not present

## 2023-09-15 DIAGNOSIS — G43009 Migraine without aura, not intractable, without status migrainosus: Secondary | ICD-10-CM | POA: Diagnosis not present

## 2023-09-15 DIAGNOSIS — Z23 Encounter for immunization: Secondary | ICD-10-CM | POA: Diagnosis not present

## 2023-09-15 DIAGNOSIS — K219 Gastro-esophageal reflux disease without esophagitis: Secondary | ICD-10-CM | POA: Diagnosis not present

## 2023-09-15 DIAGNOSIS — E782 Mixed hyperlipidemia: Secondary | ICD-10-CM | POA: Diagnosis not present

## 2023-09-15 DIAGNOSIS — E559 Vitamin D deficiency, unspecified: Secondary | ICD-10-CM | POA: Diagnosis not present

## 2023-09-16 ENCOUNTER — Encounter (INDEPENDENT_AMBULATORY_CARE_PROVIDER_SITE_OTHER): Payer: Self-pay | Admitting: Ophthalmology

## 2023-09-16 ENCOUNTER — Ambulatory Visit (INDEPENDENT_AMBULATORY_CARE_PROVIDER_SITE_OTHER): Payer: Medicare HMO | Admitting: Ophthalmology

## 2023-09-16 DIAGNOSIS — H35033 Hypertensive retinopathy, bilateral: Secondary | ICD-10-CM | POA: Diagnosis not present

## 2023-09-16 DIAGNOSIS — H353122 Nonexudative age-related macular degeneration, left eye, intermediate dry stage: Secondary | ICD-10-CM

## 2023-09-16 DIAGNOSIS — I10 Essential (primary) hypertension: Secondary | ICD-10-CM

## 2023-09-16 DIAGNOSIS — H353211 Exudative age-related macular degeneration, right eye, with active choroidal neovascularization: Secondary | ICD-10-CM

## 2023-09-16 DIAGNOSIS — Z961 Presence of intraocular lens: Secondary | ICD-10-CM

## 2023-09-16 MED ORDER — AFLIBERCEPT 2MG/0.05ML IZ SOLN FOR KALEIDOSCOPE
2.0000 mg | INTRAVITREAL | Status: AC | PRN
  Administered 2023-09-16: 2 mg via INTRAVITREAL

## 2023-09-21 ENCOUNTER — Other Ambulatory Visit: Payer: Self-pay | Admitting: Family Medicine

## 2023-09-21 DIAGNOSIS — Z1231 Encounter for screening mammogram for malignant neoplasm of breast: Secondary | ICD-10-CM

## 2023-09-21 DIAGNOSIS — M8580 Other specified disorders of bone density and structure, unspecified site: Secondary | ICD-10-CM

## 2023-10-07 DIAGNOSIS — M25531 Pain in right wrist: Secondary | ICD-10-CM | POA: Diagnosis not present

## 2023-11-19 NOTE — Progress Notes (Signed)
[facility] - Clinic Note  11/25/2023    CHIEF COMPLAINT Patient presents for Retina Follow Up   HISTORY OF PRESENT ILLNESS: [name] is a 82 y.o. female who presents to the clinic today for:   HPI     Retina Follow Up   Patient presents with  Wet AMD.  In both eyes.  This started 10 weeks ago.  Duration of 10 weeks.  Since onset it is stable.  I, the attending physician,  performed the HPI with the patient and updated documentation appropriately.        Comments   Patient feels the vision is doing well. She is not using eye drops. She is complaining of her head hurting.      Last edited by Rennis Chris, MD on 11/25/2023  4:30 PM.       Referring physician: Soundra Pilon, FNP [phone] [address] [place],  [place] [address]  HISTORICAL INFORMATION:   Selected notes from the MEDICAL RECORD NUMBER Referred by Dr. Zenaida Niece for ex ARMD LEE:  Ocular Hx- PMH-    CURRENT MEDICATIONS: No current outpatient medications on file. (Ophthalmic Drugs)   No current facility-administered medications for this visit. (Ophthalmic Drugs)   Current Outpatient Medications (Other)  Medication Sig   acetaminophen (TYLENOL) 500 MG tablet Take 1 tablet (500 mg total) by mouth every 8 (eight) hours as needed.   azelastine (ASTELIN) 0.1 % nasal spray USE TWO SPRAYS IN EACH NOSTRIL TWICE DAILY AS DIRECTED   Bilberry, Vaccinium myrtillus, (BILBERRY PO) Take 1 capsule by mouth daily.   Cholecalciferol (D3-1000 PO) Take by mouth.   cyclobenzaprine (FLEXERIL) 10 MG tablet Take 1 tablet (10 mg total) by mouth 2 (two) times daily as needed for muscle spasms.   fluticasone (FLONASE) 50 MCG/ACT nasal spray Place 2 sprays into both nostrils daily.   ibuprofen (ADVIL) 400 MG tablet Take 1 tablet (400 mg total) by mouth every 8 (eight) hours as needed.   lidocaine 4 % Place 1 patch onto the skin daily.   metoprolol succinate (TOPROL-XL) 50 MG 24 hr tablet daily.  Takes for headaches   Multiple Vitamins-Minerals (ICAPS AREDS 2 PO) Take by mouth daily.   Multiple Vitamins-Minerals (ICAPS LUTEIN & ZEAXANTHIN PO) Take by mouth.   omeprazole (PRILOSEC) 40 MG capsule Take 1 capsule by mouth daily.   oxyCODONE-acetaminophen (PERCOCET/ROXICET) 5-325 MG tablet Take 1 tablet by mouth every 6 (six) hours as needed for severe pain.   rosuvastatin (CRESTOR) 10 MG tablet    venlafaxine XR (EFFEXOR-XR) 75 MG 24 hr capsule TAKE ONE CAPSULE BY MOUTH ONCE DAILY WITH  BREAKFAST   amoxicillin-clavulanate (AUGMENTIN) 875-125 MG tablet Take 1 tablet by mouth every 12 (twelve) hours. (Patient not taking: Reported on 08/19/2022)   cephALEXin (KEFLEX) 500 MG capsule Take 1 capsule (500 mg total) by mouth 3 (three) times daily.   terbinafine (LAMISIL) 250 MG tablet Take 1 tablet (250 mg total) by mouth daily.   No current facility-administered medications for this visit. (Other)   REVIEW OF SYSTEMS: ROS   Positive for: Eyes, Psychiatric Negative for: Constitutional, Gastrointestinal, Neurological, Skin, Genitourinary, Musculoskeletal, HENT, Endocrine, Cardiovascular, Respiratory, Allergic/Imm, Heme/Lymph Last edited by Charlette Caffey, COT on 11/25/2023  1:51 PM.      ALLERGIES Allergies  Allergen Reactions   Levaquin [Levofloxacin In D5w] Nausea Only    Dizziness and tremulousness   PAST MEDICAL HISTORY Past Medical History:  Diagnosis Date   Actinic keratosis  12/03/2012   Allergy    Anxiety    Effexor helps--psychiatrist Dr Evelene Croon   Arthritis    osteoarthritis, hands   Atypical chest pain    EF 86%, breast attenuation, no significant ischemia   Cataract    Cervical radiculopathy 11/18/2013   Chronic headache    takes Metoprolol for this   Diverticulosis    Erosive esophagitis    GERD (gastroesophageal reflux disease) 11/2009   EGD: Dr. 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   Past Surgical History:  Procedure Laterality Date   AUGMENTATION MAMMAPLASTY Bilateral    silicone gel implants   Birth mark removed     CATARACT EXTRACTION Bilateral    COLONOSCOPY     COLPOSCOPY     laser vaporization of upper vagina  1998   NM MYOCAR PERF WALL MOTION  01/24/2008   EF 86% neg ischemia   VAGINAL HYSTERECTOMY  1978   FAMILY HISTORY Family History  Problem Relation Age of Onset   Heart disease Mother    Heart failure Mother    Breast cancer Mother 38   Prostate cancer Father    Inflammatory bowel disease Sister    Diabetes Maternal Aunt    Colon cancer Paternal Grandfather    Stomach cancer Neg Hx    Pancreatic cancer Neg Hx    Esophageal cancer Neg Hx    Rectal cancer Neg Hx    SOCIAL HISTORY Social History   Tobacco Use   Smoking status: Former    Current packs/day: 0.00    Average packs/day: 1 pack/day for 10.0 years (10.0 ttl pk-yrs)    Types: Cigarettes    Start date: 12/15/1978    Quit date: 12/15/1988    Years since quitting: 34.9   Smokeless tobacco: Never   Tobacco comments:    quit in the 1980's  Vaping Use   Vaping status: Never Used  Substance Use Topics   Alcohol use: Not Currently    Alcohol/week: 14.0 standard drinks of alcohol    Types: 14 Standard drinks or equivalent per week    Comment: drinks wine every day; 2 drinks a day    Drug use: No       OPHTHALMIC EXAM:  Base Eye Exam     Visual Acuity (Snellen - Linear)       Right Left   Dist [place] 20/40 20/50   Dist ph  20/30 20/40         Tonometry (Tonopen, 1:54 PM)       Right Left   Pressure 15 14         Pupils       Dark Light Shape React APD   Right 4 3 Round Brisk None   Left 4  3 Round Brisk None         Visual Fields       Left Right    Full Full         Extraocular Movement       Right Left    Full, Ortho Full, Ortho  Neuro/Psych     Oriented x3: Yes   Mood/Affect: Normal         Dilation     Both eyes: 1.0% Mydriacyl, 2.5% Phenylephrine @ 1:51 PM           Slit Lamp and Fundus Exam     Slit Lamp Exam       Right Left   Lids/Lashes Dermatochalasis - upper lid, mild MGD Dermatochalasis - upper lid, mild MGD   Conjunctiva/Sclera nasal pingeucula White and quiet   Cornea arcus, trace PEE, well healed cataract wound arcus, 1+PEE, well healed cataract wound, mild tear film debris   Anterior Chamber deep, clear, narrow temporal angle Deep and quiet   Iris Round and dilated Round and dilated   Lens PC IOL in good position, trace Posterior capsular opacification PC IOL in good position with open PC   Anterior Vitreous syneresis, Posterior vitreous detachment, vitreous condensations syneresis, Posterior vitreous detachment         Fundus Exam       Right Left   Disc Pink and Sharp Pink and Sharp, +PPP SN rim   C/D Ratio 0.2 0.2   Macula Blunted foveal reflex, central edema / shallow SRF -- stably improved, no heme, +drusen, RPE mottling Blunted foveal reflex, Drusen, RPE mottling and clumping, No heme or edema   Vessels attenuated, Tortuous attenuated, Tortuous   Periphery Attached, mild midzonal drusen, no heme Attached, mild midzonal drusen, no heme           Refraction     Wearing Rx       Sphere Cylinder Axis Add   Right -0.50 +0.50 146 +2.50   Left -1.25 +1.25 167 +2.50           IMAGING AND PROCEDURES  Imaging and Procedures for 11/25/2023           ASSESSMENT/PLAN:    ICD-10-CM   1. Exudative age-related macular degeneration of right eye with active choroidal neovascularization (HCC)  H35.3211 CANCELED: OCT, Retina - OU - Both Eyes    CANCELED: Intravitreal Injection, Pharmacologic Agent -  OD - Right Eye    2. Intermediate stage nonexudative age-related macular degeneration of left eye  H35.3122     3. Essential hypertension  I10     4. Hypertensive retinopathy of both eyes  H35.033     5. Pseudophakia, both eyes  Z96.1        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, right eye   - s/p IVA OD #1 (04.03.23), #2 (05.08.23), #3 (06.05.23), #4(07.11.23), #5 (08.22.23) -- IVA resistance - s/p IVE OD #1 (09.23.23), #2 (10.11.23) #3 (12.06.23), #4 (01.19.24), #5 (02.22.24), #6 (03.15.24), #7 (05.08.24), #8 (06.19.24), #9 (08.07.24), #10 (10.02.24)  - at initial visit, pt reported 1-2 mo history of central "spot" in vision OD - FA (04.03.23) shows +CNV with mild late leakage IT fovea, no obvious smokestack or expansile dot **interval increase in SRF on Avastin noted on OCT at 6 weeks on 08.22.23 on Eylea at 6 wks on 01.19.24** - BCVA stable 20/30 - stable - OCT shows stable improvement in central SRF overlying stable low PED temporal fovea, patchy ORA at 8 weeks  - pt reports high stress and +steroid use -- ?CSR component                             - continue AREDS2  - recommend IVE OD #11 today, 12.11.24, but pt has  a headache and prefers to come back for injection on another day - IVE OD informed consent obtained and signed on 09.29.23 - see procedure note   - f/u Friday, December 13th, DFE, OCT, possible injection  2. Age related macular degeneration, non-exudative, left eye - The incidence, anatomy, and pathology of dry AMD, risk of progression, and the AREDS 2 study including smoking risks discussed with patient.  - Recommend amsler grid monitoring weekly  - monitor  3,4. Hypertensive retinopathy OU - discussed importance of tight BP control - continue to monitor  5. Pseudophakia OU  - s/p CE/IOL (Dr. Hortense Ramal)  - IOL in good position, doing well  - continue to monitor  Ophthalmic Meds Ordered this visit:  No orders of the defined types were placed in this  encounter.    This document serves as a record of services personally performed by Karie Chimera, MD, PhD. It was created on their behalf by De Blanch, an ophthalmic technician. The creation of this record is the provider's dictation and/or activities during the visit.    Electronically signed by: De Blanch, OA, 11/25/23  4:31 PM  This document serves as a record of services personally performed by Karie Chimera, MD, PhD. It was created on their behalf by Glee Arvin. Manson Passey, OA an ophthalmic technician. The creation of this record is the provider's dictation and/or activities during the visit.    Electronically signed by: Glee Arvin. Manson Passey, OA 11/25/23 4:31 PM  Karie Chimera, M.D., Ph.D. Diseases & Surgery of the Retina and Vitreous [facility]  I have reviewed the above documentation for accuracy and completeness, and I agree with the above. Karie Chimera, M.D., Ph.D. 11/25/23 4:32 PM  Abbreviations: M myopia (nearsighted); A astigmatism; H hyperopia (farsighted); P presbyopia; Mrx spectacle prescription;  CTL contact lenses; OD right eye; OS left eye; OU both eyes  XT exotropia; ET esotropia; PEK punctate epithelial keratitis; PEE punctate epithelial erosions; DES dry eye syndrome; MGD meibomian gland dysfunction; ATs artificial tears; PFAT's preservative free artificial tears; NSC nuclear sclerotic cataract; PSC posterior subcapsular cataract; ERM epi-retinal membrane; PVD posterior vitreous detachment; RD retinal detachment; DM diabetes mellitus; DR diabetic retinopathy; NPDR non-proliferative diabetic retinopathy; PDR proliferative diabetic retinopathy; CSME clinically significant macular edema; DME diabetic macular edema; dbh dot blot hemorrhages; CWS cotton wool spot; POAG primary open angle glaucoma; C/D cup-to-disc ratio; HVF humphrey visual field; GVF goldmann visual field; OCT optical coherence tomography; IOP intraocular pressure; BRVO Branch  retinal vein occlusion; CRVO central retinal vein occlusion; CRAO central retinal artery occlusion; BRAO branch retinal artery occlusion; RT retinal tear; SB scleral buckle; PPV pars plana vitrectomy; VH Vitreous hemorrhage; PRP panretinal laser photocoagulation; IVK intravitreal kenalog; VMT vitreomacular traction; MH Macular hole;  NVD neovascularization of the disc; NVE neovascularization elsewhere; AREDS age related eye disease study; ARMD age related macular degeneration; POAG primary open angle glaucoma; EBMD epithelial/anterior basement membrane dystrophy; ACIOL anterior chamber intraocular lens; IOL intraocular lens; PCIOL posterior chamber intraocular lens; Phaco/IOL phacoemulsification with intraocular lens placement; PRK photorefractive keratectomy; LASIK laser assisted in situ keratomileusis; HTN hypertension; DM diabetes mellitus; COPD chronic obstructive pulmonary disease

## 2023-11-25 ENCOUNTER — Ambulatory Visit (INDEPENDENT_AMBULATORY_CARE_PROVIDER_SITE_OTHER): Payer: Medicare HMO | Admitting: Ophthalmology

## 2023-11-25 ENCOUNTER — Encounter (INDEPENDENT_AMBULATORY_CARE_PROVIDER_SITE_OTHER): Payer: Self-pay | Admitting: Ophthalmology

## 2023-11-25 DIAGNOSIS — H353211 Exudative age-related macular degeneration, right eye, with active choroidal neovascularization: Secondary | ICD-10-CM

## 2023-11-25 DIAGNOSIS — H353122 Nonexudative age-related macular degeneration, left eye, intermediate dry stage: Secondary | ICD-10-CM

## 2023-11-25 DIAGNOSIS — H35033 Hypertensive retinopathy, bilateral: Secondary | ICD-10-CM

## 2023-11-25 DIAGNOSIS — I10 Essential (primary) hypertension: Secondary | ICD-10-CM

## 2023-11-25 DIAGNOSIS — Z961 Presence of intraocular lens: Secondary | ICD-10-CM

## 2023-11-26 NOTE — Progress Notes (Signed)
[facility] - Clinic Note  11/27/2023    CHIEF COMPLAINT Patient presents for Retina Follow Up   HISTORY OF PRESENT ILLNESS: [name] is a 82 y.o. female who presents to the clinic today for:   HPI     Retina Follow Up   Patient presents with  Wet AMD.  In right eye.  This started 2 days ago.  I, the attending physician,  performed the HPI with the patient and updated documentation appropriately.        Comments   Patient here for 2 days retina follow up for exu ARMD OD. Patient states vision doing pretty good. No eye pain.       Last edited by Rennis Chris, MD on 11/27/2023  4:08 PM.     Referring physician: Soundra Pilon, FNP [phone] [address] [place],  [place] [address]  HISTORICAL INFORMATION:   Selected notes from the MEDICAL RECORD NUMBER Referred by Dr. Zenaida Niece for ex ARMD LEE:  Ocular Hx- PMH-    CURRENT MEDICATIONS: No current outpatient medications on file. (Ophthalmic Drugs)   No current facility-administered medications for this visit. (Ophthalmic Drugs)   Current Outpatient Medications (Other)  Medication Sig   acetaminophen (TYLENOL) 500 MG tablet Take 1 tablet (500 mg total) by mouth every 8 (eight) hours as needed.   azelastine (ASTELIN) 0.1 % nasal spray USE TWO SPRAYS IN EACH NOSTRIL TWICE DAILY AS DIRECTED   Bilberry, Vaccinium myrtillus, (BILBERRY PO) Take 1 capsule by mouth daily.   Cholecalciferol (D3-1000 PO) Take by mouth.   cyclobenzaprine (FLEXERIL) 10 MG tablet Take 1 tablet (10 mg total) by mouth 2 (two) times daily as needed for muscle spasms.   fluticasone (FLONASE) 50 MCG/ACT nasal spray Place 2 sprays into both nostrils daily.   ibuprofen (ADVIL) 400 MG tablet Take 1 tablet (400 mg total) by mouth every 8 (eight) hours as needed.   lidocaine 4 % Place 1 patch onto the skin daily.   metoprolol succinate (TOPROL-XL) 50 MG 24 hr tablet daily. Takes for headaches   Multiple Vitamins-Minerals  (ICAPS AREDS 2 PO) Take by mouth daily.   Multiple Vitamins-Minerals (ICAPS LUTEIN & ZEAXANTHIN PO) Take by mouth.   omeprazole (PRILOSEC) 40 MG capsule Take 1 capsule by mouth daily.   oxyCODONE-acetaminophen (PERCOCET/ROXICET) 5-325 MG tablet Take 1 tablet by mouth every 6 (six) hours as needed for severe pain.   rosuvastatin (CRESTOR) 10 MG tablet    venlafaxine XR (EFFEXOR-XR) 75 MG 24 hr capsule TAKE ONE CAPSULE BY MOUTH ONCE DAILY WITH  BREAKFAST   amoxicillin-clavulanate (AUGMENTIN) 875-125 MG tablet Take 1 tablet by mouth every 12 (twelve) hours. (Patient not taking: Reported on 08/19/2022)   cephALEXin (KEFLEX) 500 MG capsule Take 1 capsule (500 mg total) by mouth 3 (three) times daily.   terbinafine (LAMISIL) 250 MG tablet Take 1 tablet (250 mg total) by mouth daily.   No current facility-administered medications for this visit. (Other)   REVIEW OF SYSTEMS: ROS   Positive for: Eyes, Psychiatric Negative for: Constitutional, Gastrointestinal, Neurological, Skin, Genitourinary, Musculoskeletal, HENT, Endocrine, Cardiovascular, Respiratory, Allergic/Imm, Heme/Lymph Last edited by Laddie Aquas, COA on 11/27/2023  1:39 PM.       ALLERGIES Allergies  Allergen Reactions   Levaquin [Levofloxacin In D5w] Nausea Only    Dizziness and tremulousness   PAST MEDICAL HISTORY Past Medical History:  Diagnosis Date   Actinic keratosis 12/03/2012   Allergy    Anxiety  Effexor helps--psychiatrist Dr Evelene Croon   Arthritis    osteoarthritis, hands   Atypical chest pain    EF 86%, breast attenuation, no significant ischemia   Cataract    Cervical radiculopathy 11/18/2013   Chronic headache    takes Metoprolol for this   Diverticulosis    Erosive esophagitis    GERD (gastroesophageal reflux disease) 11/2009   EGD: Dr. 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   Past Surgical History:  Procedure Laterality Date   AUGMENTATION MAMMAPLASTY Bilateral    silicone gel implants   Birth mark removed     CATARACT EXTRACTION Bilateral    COLONOSCOPY     COLPOSCOPY     laser vaporization of upper vagina  1998   NM MYOCAR PERF WALL MOTION  01/24/2008   EF 86% neg ischemia   VAGINAL HYSTERECTOMY  1978   FAMILY HISTORY Family History  Problem Relation Age of Onset   Heart disease Mother    Heart failure Mother    Breast cancer Mother 13   Prostate cancer Father    Inflammatory bowel disease Sister    Diabetes Maternal Aunt    Colon cancer Paternal Grandfather    Stomach cancer Neg Hx    Pancreatic cancer Neg Hx    Esophageal cancer Neg Hx    Rectal cancer Neg Hx    SOCIAL HISTORY Social History   Tobacco Use   Smoking status: Former    Current packs/day: 0.00    Average packs/day: 1 pack/day for 10.0 years (10.0 ttl pk-yrs)    Types: Cigarettes    Start date: 12/15/1978    Quit date: 12/15/1988    Years since quitting: 34.9   Smokeless tobacco: Never   Tobacco comments:    quit in the 1980's  Vaping Use   Vaping status: Never Used  Substance Use Topics   Alcohol use: Not Currently    Alcohol/week: 14.0 standard drinks of alcohol    Types: 14 Standard drinks or equivalent per week    Comment: drinks wine every day; 2 drinks a day    Drug use: No       OPHTHALMIC EXAM:  Base Eye Exam     Visual Acuity (Snellen - Linear)       Right Left   Dist [place] 20/40 -2 20/50   Dist ph [place] 20/30 -1 20/40 -2         Tonometry (Tonopen, 1:37 PM)       Right Left   Pressure 17 18         Pupils       Dark Light Shape React APD   Right 4 3 Round Brisk None   Left 4 3 Round Brisk None         Visual Fields  (Counting fingers)       Left Right    Full Full         Extraocular Movement       Right Left    Full, Ortho Full, Ortho         Neuro/Psych     Oriented  x3: Yes   Mood/Affect: Normal         Dilation     Both eyes: 1.0% Mydriacyl, 2.5% Phenylephrine @ 1:37 PM           Slit Lamp and Fundus Exam     Slit Lamp Exam       Right Left   Lids/Lashes Dermatochalasis - upper lid, mild MGD Dermatochalasis - upper lid, mild MGD   Conjunctiva/Sclera nasal pingeucula White and quiet   Cornea arcus, trace PEE, well healed cataract wound arcus, 1+PEE, well healed cataract wound, mild tear film debris   Anterior Chamber deep, clear, narrow temporal angle Deep and quiet   Iris Round and dilated Round and dilated   Lens PC IOL in good position, trace Posterior capsular opacification PC IOL in good position with open PC   Anterior Vitreous syneresis, Posterior vitreous detachment, vitreous condensations syneresis, Posterior vitreous detachment         Fundus Exam       Right Left   Disc Pink and Sharp Pink and Sharp, +PPP SN rim   C/D Ratio 0.2 0.2   Macula Blunted foveal reflex, central edema / shallow SRF -- stably improved, no heme, +drusen, RPE mottling Blunted foveal reflex, Drusen, RPE mottling and clumping, No heme or edema   Vessels attenuated, Tortuous attenuated, Tortuous   Periphery Attached, mild midzonal drusen, no heme Attached, mild midzonal drusen, no heme           IMAGING AND PROCEDURES  Imaging and Procedures for 11/27/2023  OCT, Retina - OU - Both Eyes       Right Eye Quality was good. Central Foveal Thickness: 237. Progression has been stable. Findings include normal foveal contour, no IRF, no SRF, retinal drusen , intraretinal hyper-reflective material, pigment epithelial detachment, outer retinal atrophy (stable improvement in central SRF overlying stable low PED temporal fovea; patchy ORA).   Left Eye Quality was good. Central Foveal  Thickness: 276. Progression has been stable. Findings include normal foveal contour, no IRF, no SRF, retinal drusen , outer retinal atrophy (Patchy ORA).   Notes *Images captured and stored on drive  Diagnosis / Impression:  OD: stable improvement in central SRF overlying stable low PED temporal fovea; patchy ORA OS: non-exu ARMD, patchy ORA  Clinical management:  See below  Abbreviations: NFP - Normal foveal profile. CME - cystoid macular edema. PED - pigment epithelial detachment. IRF - intraretinal fluid. SRF - subretinal fluid. EZ - ellipsoid zone. ERM - epiretinal membrane. ORA - outer retinal atrophy. ORT - outer retinal tubulation. SRHM - subretinal hyper-reflective material. IRHM - intraretinal hyper-reflective material      Intravitreal Injection, Pharmacologic Agent - OD - Right Eye       Time Out 11/27/2023. 1:57 PM. Confirmed correct patient, procedure, site, and patient consented.   Anesthesia Topical anesthesia was used. Anesthetic medications included Lidocaine 2%, Proparacaine 0.5%.   Procedure Preparation included 5% betadine to ocular surface, eyelid speculum. A (32g) needle was used.   Injection: 2 mg aflibercept 2 MG/0.05ML   Route: Intravitreal, Site: Right Eye   NDC: L6038910, Lot: [id number], Expiration date: 05/14/2024, Waste: 0 mL   Post-op Post injection exam found visual acuity of at least counting fingers. The patient tolerated the procedure well. There were no complications. The patient received written and verbal post procedure care education. Post injection medications were not given.            ASSESSMENT/PLAN:    ICD-10-CM   1.  Exudative age-related macular degeneration of right eye with active choroidal neovascularization (HCC)  H35.3211 OCT, Retina - OU - Both Eyes    Intravitreal Injection, Pharmacologic Agent - OD - Right Eye    aflibercept (EYLEA) SOLN 2 mg    2. Intermediate stage nonexudative age-related macular degeneration  of left eye  H35.3122     3. Essential hypertension  I10     4. Hypertensive retinopathy of both eyes  H35.033     5. 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, right eye   - s/p IVA OD #1 (04.03.23), #2 (05.08.23), #3 (06.05.23), #4(07.11.23), #5 (08.22.23) -- IVA resistance - s/p IVE OD #1 (09.23.23), #2 (10.11.23) #3 (12.06.23), #4 (01.19.24), #5 (02.22.24), #6 (03.15.24), #7 (05.08.24), #8 (06.19.24), #9 (08.07.24), #10 (10.02.24)  - at initial visit, pt reported 1-2 mo history of central "spot" in vision OD - FA (04.03.23) shows +CNV with mild late leakage IT fovea, no obvious smokestack or expansile dot **interval increase in SRF on Avastin noted on OCT at 6 weeks on 08.22.23 on Eylea at 6 wks on 01.19.24** - BCVA stable 20/30 - stable - OCT shows stable improvement in central SRF overlying stable low PED temporal fovea, patchy ORA at 10 weeks  - pt reports high stress and +steroid use -- ?CSR component                             - continue AREDS2  - recommend IVE OD #11 today, 12.13.24 w/ ext f/u to 12 wks - RBA of procedure discussed, questions answered - IVE OD informed consent obtained and signed on 09.29.23 - see procedure note   - f/u in 12 wks - DFE, OCT, possible injection  2. Age related macular degeneration, non-exudative, left eye - The incidence, anatomy, and pathology of dry AMD, risk of progression, and the AREDS 2 study including smoking risks discussed with patient.  - Recommend amsler grid monitoring weekly  - monitor  3,4. Hypertensive retinopathy OU - discussed importance of tight BP control - continue to monitor  5. Pseudophakia OU  - s/p CE/IOL (Dr. Hortense Ramal)  - IOL in good position, doing well  - continue to monitor  Ophthalmic Meds Ordered this visit:  Meds ordered this encounter  Medications   aflibercept (EYLEA) SOLN 2 mg    This document serves as a record of services personally performed by Karie Chimera, MD, PhD. It was created on their behalf by Berlin Hun COT, an ophthalmic technician. The creation of this record is the provider's dictation and/or activities during the visit.    Electronically signed by: Berlin Hun COT 12.12.24 11:28 PM  This document serves as a record of services personally performed by Karie Chimera, MD, PhD. It was created on their behalf by Glee Arvin. Manson Passey, OA an ophthalmic technician. The creation of this record is the provider's dictation and/or activities during the visit.    Electronically signed by: Glee Arvin. Manson Passey, OA 11/27/23 11:28 PM  Karie Chimera, M.D., Ph.D. Diseases & Surgery of the Retina and Vitreous [facility] 11/27/2023   I have reviewed the above documentation for accuracy and completeness, and I agree with the above. Karie Chimera, M.D., Ph.D. 11/27/23 11:30 PM   Abbreviations: M myopia (nearsighted); A astigmatism; H hyperopia (farsighted); P presbyopia; Mrx spectacle prescription;  CTL contact lenses; OD right eye;  OS left eye; OU both eyes  XT exotropia; ET esotropia; PEK punctate epithelial keratitis; PEE punctate epithelial erosions; DES dry eye syndrome; MGD meibomian gland dysfunction; ATs artificial tears; PFAT's preservative free artificial tears; NSC nuclear sclerotic cataract; PSC posterior subcapsular cataract; ERM epi-retinal membrane; PVD posterior vitreous detachment; RD retinal detachment; DM diabetes mellitus; DR diabetic retinopathy; NPDR non-proliferative diabetic retinopathy; PDR proliferative diabetic retinopathy; CSME clinically significant macular edema; DME diabetic macular edema; dbh dot blot hemorrhages; CWS cotton wool spot; POAG primary open angle glaucoma; C/D cup-to-disc ratio; HVF humphrey visual field; GVF goldmann visual field; OCT optical coherence tomography; IOP intraocular pressure; BRVO Branch retinal vein occlusion; CRVO central retinal vein occlusion; CRAO central  retinal artery occlusion; BRAO branch retinal artery occlusion; RT retinal tear; SB scleral buckle; PPV pars plana vitrectomy; VH Vitreous hemorrhage; PRP panretinal laser photocoagulation; IVK intravitreal kenalog; VMT vitreomacular traction; MH Macular hole;  NVD neovascularization of the disc; NVE neovascularization elsewhere; AREDS age related eye disease study; ARMD age related macular degeneration; POAG primary open angle glaucoma; EBMD epithelial/anterior basement membrane dystrophy; ACIOL anterior chamber intraocular lens; IOL intraocular lens; PCIOL posterior chamber intraocular lens; Phaco/IOL phacoemulsification with intraocular lens placement; PRK photorefractive keratectomy; LASIK laser assisted in situ keratomileusis; HTN hypertension; DM diabetes mellitus; COPD chronic obstructive pulmonary disease

## 2023-11-27 ENCOUNTER — Encounter (INDEPENDENT_AMBULATORY_CARE_PROVIDER_SITE_OTHER): Payer: Self-pay | Admitting: Ophthalmology

## 2023-11-27 ENCOUNTER — Ambulatory Visit (INDEPENDENT_AMBULATORY_CARE_PROVIDER_SITE_OTHER): Payer: Medicare HMO | Admitting: Ophthalmology

## 2023-11-27 DIAGNOSIS — I10 Essential (primary) hypertension: Secondary | ICD-10-CM | POA: Diagnosis not present

## 2023-11-27 DIAGNOSIS — H353122 Nonexudative age-related macular degeneration, left eye, intermediate dry stage: Secondary | ICD-10-CM | POA: Diagnosis not present

## 2023-11-27 DIAGNOSIS — H353211 Exudative age-related macular degeneration, right eye, with active choroidal neovascularization: Secondary | ICD-10-CM

## 2023-11-27 DIAGNOSIS — Z961 Presence of intraocular lens: Secondary | ICD-10-CM

## 2023-11-27 DIAGNOSIS — H35033 Hypertensive retinopathy, bilateral: Secondary | ICD-10-CM

## 2023-11-27 MED ORDER — AFLIBERCEPT 2MG/0.05ML IZ SOLN FOR KALEIDOSCOPE
2.0000 mg | INTRAVITREAL | Status: AC | PRN
Start: 2023-11-27 — End: 2023-11-27
  Administered 2023-11-27: 2 mg via INTRAVITREAL

## 2023-12-29 DIAGNOSIS — Z6821 Body mass index (BMI) 21.0-21.9, adult: Secondary | ICD-10-CM | POA: Diagnosis not present

## 2023-12-29 DIAGNOSIS — I10 Essential (primary) hypertension: Secondary | ICD-10-CM | POA: Diagnosis not present

## 2023-12-29 DIAGNOSIS — H353122 Nonexudative age-related macular degeneration, left eye, intermediate dry stage: Secondary | ICD-10-CM | POA: Diagnosis not present

## 2023-12-29 DIAGNOSIS — F3341 Major depressive disorder, recurrent, in partial remission: Secondary | ICD-10-CM | POA: Diagnosis not present

## 2023-12-29 DIAGNOSIS — N390 Urinary tract infection, site not specified: Secondary | ICD-10-CM | POA: Diagnosis not present

## 2023-12-29 DIAGNOSIS — H353211 Exudative age-related macular degeneration, right eye, with active choroidal neovascularization: Secondary | ICD-10-CM | POA: Diagnosis not present

## 2024-02-10 NOTE — Progress Notes (Signed)
 [facility] - Clinic Note  02/11/2024    CHIEF COMPLAINT Patient presents for Retina Follow Up   HISTORY OF PRESENT ILLNESS: [name] is a 83 y.o. female who presents to the clinic today for:   HPI     Retina Follow Up   Patient presents with  Wet AMD.  In right eye.  This started 12 weeks ago.  I, the attending physician,  performed the HPI with the patient and updated documentation appropriately.        Comments   Patient here for 12 weeks retina follow up for exu ARMD OD. Patient states vision not like it was. Not doing as good as first. No eye pain.      Last edited by Rennis Chris, MD on 02/11/2024 12:32 PM.    Patient states vision not the same as it was.   Referring physician: Soundra Pilon, FNP [phone] [address] [place],  [place] [address]  HISTORICAL INFORMATION:   Selected notes from the MEDICAL RECORD NUMBER Referred by Dr. Zenaida Niece for ex ARMD LEE:  Ocular Hx- PMH-    CURRENT MEDICATIONS: No current outpatient medications on file. (Ophthalmic Drugs)   No current facility-administered medications for this visit. (Ophthalmic Drugs)   Current Outpatient Medications (Other)  Medication Sig   acetaminophen (TYLENOL) 500 MG tablet Take 1 tablet (500 mg total) by mouth every 8 (eight) hours as needed.   azelastine (ASTELIN) 0.1 % nasal spray USE TWO SPRAYS IN EACH NOSTRIL TWICE DAILY AS DIRECTED   Bilberry, Vaccinium myrtillus, (BILBERRY PO) Take 1 capsule by mouth daily.   Cholecalciferol (D3-1000 PO) Take by mouth.   cyclobenzaprine (FLEXERIL) 10 MG tablet Take 1 tablet (10 mg total) by mouth 2 (two) times daily as needed for muscle spasms.   fluticasone (FLONASE) 50 MCG/ACT nasal spray Place 2 sprays into both nostrils daily.   ibuprofen (ADVIL) 400 MG tablet Take 1 tablet (400 mg total) by mouth every 8 (eight) hours as needed.   lidocaine 4 % Place 1 patch onto the skin daily.   metoprolol succinate (TOPROL-XL) 50 MG  24 hr tablet daily. Takes for headaches   Multiple Vitamins-Minerals (ICAPS AREDS 2 PO) Take by mouth daily.   Multiple Vitamins-Minerals (ICAPS LUTEIN & ZEAXANTHIN PO) Take by mouth.   omeprazole (PRILOSEC) 40 MG capsule Take 1 capsule by mouth daily.   oxyCODONE-acetaminophen (PERCOCET/ROXICET) 5-325 MG tablet Take 1 tablet by mouth every 6 (six) hours as needed for severe pain.   rosuvastatin (CRESTOR) 10 MG tablet    venlafaxine XR (EFFEXOR-XR) 75 MG 24 hr capsule TAKE ONE CAPSULE BY MOUTH ONCE DAILY WITH  BREAKFAST   amoxicillin-clavulanate (AUGMENTIN) 875-125 MG tablet Take 1 tablet by mouth every 12 (twelve) hours. (Patient not taking: Reported on 08/19/2022)   cephALEXin (KEFLEX) 500 MG capsule Take 1 capsule (500 mg total) by mouth 3 (three) times daily.   terbinafine (LAMISIL) 250 MG tablet Take 1 tablet (250 mg total) by mouth daily.   No current facility-administered medications for this visit. (Other)   REVIEW OF SYSTEMS: ROS   Positive for: Eyes, Psychiatric Negative for: Constitutional, Gastrointestinal, Neurological, Skin, Genitourinary, Musculoskeletal, HENT, Endocrine, Cardiovascular, Respiratory, Allergic/Imm, Heme/Lymph Last edited by Laddie Aquas, COA on 02/11/2024  9:56 AM.     ALLERGIES Allergies  Allergen Reactions   Levaquin [Levofloxacin In D5w] Nausea Only    Dizziness and tremulousness   PAST MEDICAL HISTORY Past Medical History:  Diagnosis Date  Actinic keratosis 12/03/2012   Allergy    Anxiety    Effexor helps--psychiatrist Dr Evelene Croon   Arthritis    osteoarthritis, hands   Atypical chest pain    EF 86%, breast attenuation, no significant ischemia   Cataract    Cervical radiculopathy 11/18/2013   Chronic headache    takes Metoprolol for this   Diverticulosis    Erosive esophagitis    GERD (gastroesophageal reflux disease) 11/2009   EGD: Dr. 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   Past Surgical History:  Procedure Laterality Date   AUGMENTATION MAMMAPLASTY Bilateral    silicone gel implants   Birth mark removed     CATARACT EXTRACTION Bilateral    COLONOSCOPY     COLPOSCOPY     laser vaporization of upper vagina  1998   NM MYOCAR PERF WALL MOTION  01/24/2008   EF 86% neg ischemia   VAGINAL HYSTERECTOMY  1978   FAMILY HISTORY Family History  Problem Relation Age of Onset   Heart disease Mother    Heart failure Mother    Breast cancer Mother 76   Prostate cancer Father    Inflammatory bowel disease Sister    Diabetes Maternal Aunt    Colon cancer Paternal Grandfather    Stomach cancer Neg Hx    Pancreatic cancer Neg Hx    Esophageal cancer Neg Hx    Rectal cancer Neg Hx    SOCIAL HISTORY Social History   Tobacco Use   Smoking status: Former    Current packs/day: 0.00    Average packs/day: 1 pack/day for 10.0 years (10.0 ttl pk-yrs)    Types: Cigarettes    Start date: 12/15/1978    Quit date: 12/15/1988    Years since quitting: 35.1   Smokeless tobacco: Never   Tobacco comments:    quit in the 1980's  Vaping Use   Vaping status: Never Used  Substance Use Topics   Alcohol use: Not Currently    Alcohol/week: 14.0 standard drinks of alcohol    Types: 14 Standard drinks or equivalent per week    Comment: drinks wine every day; 2 drinks a day    Drug use: No       OPHTHALMIC EXAM:  Base Eye Exam     Visual Acuity (Snellen - Linear)       Right Left   Dist cc 20/40 20/40 -1   Dist ph cc 20/30 -1 NI    Correction: Glasses         Tonometry (Tonopen, 9:53 AM)       Right Left   Pressure 14 14         Pupils       Dark Light Shape React  APD   Right 4 3 Round Brisk None   Left 4 3 Round Brisk None         Visual Fields (Counting fingers)       Left Right    Full Full         Extraocular Movement       Right Left    Full,  Ortho Full, Ortho         Neuro/Psych     Oriented x3: Yes   Mood/Affect: Normal         Dilation     Both eyes: 1.0% Mydriacyl, 2.5% Phenylephrine @ 9:53 AM           Slit Lamp and Fundus Exam     Slit Lamp Exam       Right Left   Lids/Lashes Dermatochalasis - upper lid, mild MGD Dermatochalasis - upper lid, mild MGD   Conjunctiva/Sclera nasal pingeucula White and quiet   Cornea arcus, trace PEE, well healed cataract wound arcus, 1+PEE, well healed cataract wound, mild tear film debris   Anterior Chamber deep, clear, narrow temporal angle Deep and quiet   Iris Round and dilated Round and dilated   Lens PC IOL in good position, trace Posterior capsular opacification PC IOL in good position with open PC   Anterior Vitreous syneresis, Posterior vitreous detachment, vitreous condensations syneresis, Posterior vitreous detachment         Fundus Exam       Right Left   Disc Pink and Sharp Pink and Sharp, +PPP SN rim   C/D Ratio 0.2 0.2   Macula Blunted foveal reflex, central edema / shallow SRF -- stably improved, no heme, +drusen, RPE mottling Blunted foveal reflex, Drusen, RPE mottling and clumping, No heme or edema   Vessels attenuated, Tortuous attenuated, Tortuous   Periphery Attached, mild midzonal drusen, no heme Attached, mild midzonal drusen, no heme           Refraction     Wearing Rx       Sphere Cylinder Axis Add   Right -0.50 +0.50 146 +2.50   Left -1.25 +1.25 167 +2.50           IMAGING AND PROCEDURES  Imaging and Procedures for 02/11/2024  OCT, Retina - OU - Both Eyes       Right Eye Quality was good. Central Foveal Thickness: 233. Progression has been stable. Findings include normal foveal contour, no IRF, no SRF, retinal drusen ,  intraretinal hyper-reflective material, pigment epithelial detachment, outer retinal atrophy (stable improvement in central SRF overlying stable low PED temporal fovea; patchy ORA).   Left Eye Quality was good. Central Foveal Thickness: 270. Progression has been stable. Findings include normal foveal contour, no IRF, no SRF, retinal drusen , pigment epithelial detachment, outer retinal atrophy (Patchy ORA).   Notes *Images captured and stored on drive  Diagnosis / Impression:  OD: stable improvement in central SRF overlying stable low PED temporal fovea; patchy ORA OS: non-exu ARMD, patchy ORA  Clinical management:  See below  Abbreviations: NFP - Normal foveal profile. CME - cystoid macular edema. PED - pigment epithelial detachment. IRF - intraretinal fluid. SRF - subretinal fluid. EZ - ellipsoid zone. ERM - epiretinal membrane. ORA - outer retinal atrophy. ORT - outer retinal tubulation. SRHM - subretinal hyper-reflective material. IRHM - intraretinal hyper-reflective material      Intravitreal Injection, Pharmacologic Agent - OD - Right Eye       Time Out 02/11/2024. 11:03 AM. Confirmed correct patient, procedure, site, and patient consented.   Anesthesia Topical anesthesia was used. Anesthetic medications included Lidocaine 2%, Proparacaine 0.5%.   Procedure Preparation included 5% betadine to ocular surface, eyelid speculum. A supplied needle was used.   Injection: 1.25 mg Bevacizumab 1.25mg /0.33ml   Route: Intravitreal, Site: Right Eye   NDC: [id number], Lot: [id number], Expiration date: 03/19/2024  Post-op Post injection exam found visual acuity of at least counting fingers. The patient tolerated the procedure well. There were no complications. The patient received written and verbal post procedure care education.            ASSESSMENT/PLAN:    ICD-10-CM   1. Exudative age-related macular degeneration of right eye with active choroidal neovascularization (HCC)   H35.3211 OCT, Retina - OU - Both Eyes    Intravitreal Injection, Pharmacologic Agent - OD - Right Eye    Bevacizumab (AVASTIN) SOLN 1.25 mg    2. Intermediate stage nonexudative age-related macular degeneration of left eye  H35.3122     3. Essential hypertension  I10     4. Hypertensive retinopathy of both eyes  H35.033     5. 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, right eye   - s/p IVA OD #1 (04.03.23), #2 (05.08.23), #3 (06.05.23), #4(07.11.23), #5 (08.22.23) -- IVA resistance - s/p IVE OD #1 (09.23.23), #2 (10.11.23) #3 (12.06.23), #4 (01.19.24), #5 (02.22.24), #6 (03.15.24), #7 (05.08.24), #8 (06.19.24), #9 (08.07.24), #10 (10.02.24), #11 (12.13.24)  - at initial visit, pt reported 1-2 mo history of central "spot" in vision OD - FA (04.03.23) shows +CNV with mild late leakage IT fovea, no obvious smokestack or expansile dot **interval increase in SRF on Avastin noted on OCT at 6 weeks on 08.22.23 on Eylea at 6 wks on 01.19.24** - BCVA stable 20/30 - stable - OCT shows stable improvement in central SRF overlying stable low PED temporal fovea, patchy ORA at 11 weeks  - pt reports high stress and +steroid use -- ?CSR component                             - discussed [employer] funding unavailable - pt wishes to switch back to IVA OD #6 today (02.27.25), recommend f/u in 10 wks - RBA of procedure discussed, questions answered - IVA informed consent obtained and signed 02.27.25 - IVE OD informed consent obtained and signed on 09.29.23 - see procedure note   - f/u in 10 wks - DFE, OCT, possible injection  2. Age related macular degeneration, non-exudative, left eye - The incidence, anatomy, and pathology of dry AMD, risk of progression, and the AREDS 2 study including smoking risks discussed with patient.  - Recommend amsler grid monitoring weekly  - monitor  3,4. Hypertensive retinopathy OU - discussed importance of tight BP control -  continue to monitor  5. Pseudophakia OU  - s/p CE/IOL (Dr. Hortense Ramal)  - IOL in good position, doing well  - continue to monitor  Ophthalmic Meds Ordered this visit:  Meds ordered this encounter  Medications   Bevacizumab (AVASTIN) SOLN 1.25 mg    This document serves as a record of services personally performed by Karie Chimera, MD, PhD. It was created on their behalf by Annalee Genta, COMT. The creation of this record is the provider's dictation and/or activities during the visit.  Electronically signed by: Annalee Genta, COMT 02/11/24 12:42 PM  This document serves as a record of services personally performed by Karie Chimera, MD, PhD. It was created on their behalf by Glee Arvin. Manson Passey, OA an ophthalmic technician. The creation of this record is the provider's dictation and/or activities during the visit.    Electronically signed by: Glee Arvin. Manson Passey, OA 02/11/24 12:42 PM  Karie Chimera, M.D., Ph.D. Diseases &  Surgery of the Retina and Vitreous [facility] 02/10/2023   I have reviewed the above documentation for accuracy and completeness, and I agree with the above. Karie Chimera, M.D., Ph.D. 02/11/24 12:44 PM   Abbreviations: M myopia (nearsighted); A astigmatism; H hyperopia (farsighted); P presbyopia; Mrx spectacle prescription;  CTL contact lenses; OD right eye; OS left eye; OU both eyes  XT exotropia; ET esotropia; PEK punctate epithelial keratitis; PEE punctate epithelial erosions; DES dry eye syndrome; MGD meibomian gland dysfunction; ATs artificial tears; PFAT's preservative free artificial tears; NSC nuclear sclerotic cataract; PSC posterior subcapsular cataract; ERM epi-retinal membrane; PVD posterior vitreous detachment; RD retinal detachment; DM diabetes mellitus; DR diabetic retinopathy; NPDR non-proliferative diabetic retinopathy; PDR proliferative diabetic retinopathy; CSME clinically significant macular edema; DME diabetic macular edema; dbh  dot blot hemorrhages; CWS cotton wool spot; POAG primary open angle glaucoma; C/D cup-to-disc ratio; HVF humphrey visual field; GVF goldmann visual field; OCT optical coherence tomography; IOP intraocular pressure; BRVO Branch retinal vein occlusion; CRVO central retinal vein occlusion; CRAO central retinal artery occlusion; BRAO branch retinal artery occlusion; RT retinal tear; SB scleral buckle; PPV pars plana vitrectomy; VH Vitreous hemorrhage; PRP panretinal laser photocoagulation; IVK intravitreal kenalog; VMT vitreomacular traction; MH Macular hole;  NVD neovascularization of the disc; NVE neovascularization elsewhere; AREDS age related eye disease study; ARMD age related macular degeneration; POAG primary open angle glaucoma; EBMD epithelial/anterior basement membrane dystrophy; ACIOL anterior chamber intraocular lens; IOL intraocular lens; PCIOL posterior chamber intraocular lens; Phaco/IOL phacoemulsification with intraocular lens placement; PRK photorefractive keratectomy; LASIK laser assisted in situ keratomileusis; HTN hypertension; DM diabetes mellitus; COPD chronic obstructive pulmonary disease

## 2024-02-11 ENCOUNTER — Ambulatory Visit (INDEPENDENT_AMBULATORY_CARE_PROVIDER_SITE_OTHER): Payer: Medicare HMO | Admitting: Ophthalmology

## 2024-02-11 ENCOUNTER — Encounter (INDEPENDENT_AMBULATORY_CARE_PROVIDER_SITE_OTHER): Payer: Self-pay | Admitting: Ophthalmology

## 2024-02-11 DIAGNOSIS — H353211 Exudative age-related macular degeneration, right eye, with active choroidal neovascularization: Secondary | ICD-10-CM

## 2024-02-11 DIAGNOSIS — I10 Essential (primary) hypertension: Secondary | ICD-10-CM | POA: Diagnosis not present

## 2024-02-11 DIAGNOSIS — Z961 Presence of intraocular lens: Secondary | ICD-10-CM | POA: Diagnosis not present

## 2024-02-11 DIAGNOSIS — H35033 Hypertensive retinopathy, bilateral: Secondary | ICD-10-CM

## 2024-02-11 DIAGNOSIS — H353122 Nonexudative age-related macular degeneration, left eye, intermediate dry stage: Secondary | ICD-10-CM

## 2024-02-11 MED ORDER — BEVACIZUMAB CHEMO INJECTION 1.25MG/0.05ML SYRINGE FOR KALEIDOSCOPE
1.2500 mg | INTRAVITREAL | Status: AC | PRN
Start: 2024-02-11 — End: 2024-02-11
  Administered 2024-02-11: 1.25 mg via INTRAVITREAL

## 2024-02-12 ENCOUNTER — Encounter (INDEPENDENT_AMBULATORY_CARE_PROVIDER_SITE_OTHER): Payer: Medicare HMO | Admitting: Ophthalmology

## 2024-03-16 DIAGNOSIS — I10 Essential (primary) hypertension: Secondary | ICD-10-CM | POA: Diagnosis not present

## 2024-03-16 DIAGNOSIS — E782 Mixed hyperlipidemia: Secondary | ICD-10-CM | POA: Diagnosis not present

## 2024-03-30 DIAGNOSIS — N1831 Chronic kidney disease, stage 3a: Secondary | ICD-10-CM | POA: Diagnosis not present

## 2024-03-30 DIAGNOSIS — E782 Mixed hyperlipidemia: Secondary | ICD-10-CM | POA: Diagnosis not present

## 2024-03-30 DIAGNOSIS — I10 Essential (primary) hypertension: Secondary | ICD-10-CM | POA: Diagnosis not present

## 2024-04-01 ENCOUNTER — Emergency Department (HOSPITAL_COMMUNITY)

## 2024-04-01 ENCOUNTER — Inpatient Hospital Stay (HOSPITAL_COMMUNITY)
Admission: EM | Admit: 2024-04-01 | Discharge: 2024-04-07 | DRG: 271 | Disposition: A | Discharge status: Home Health | Attending: Student | Admitting: Student

## 2024-04-01 ENCOUNTER — Encounter (HOSPITAL_COMMUNITY): Payer: Self-pay | Admitting: Pharmacy Technician

## 2024-04-01 ENCOUNTER — Other Ambulatory Visit: Payer: Self-pay

## 2024-04-01 DIAGNOSIS — Z90710 Acquired absence of both cervix and uterus: Secondary | ICD-10-CM | POA: Diagnosis not present

## 2024-04-01 DIAGNOSIS — Z48812 Encounter for surgical aftercare following surgery on the circulatory system: Secondary | ICD-10-CM | POA: Diagnosis not present

## 2024-04-01 DIAGNOSIS — I82409 Acute embolism and thrombosis of unspecified deep veins of unspecified lower extremity: Secondary | ICD-10-CM | POA: Diagnosis not present

## 2024-04-01 DIAGNOSIS — M19042 Primary osteoarthritis, left hand: Secondary | ICD-10-CM | POA: Diagnosis not present

## 2024-04-01 DIAGNOSIS — Z8719 Personal history of other diseases of the digestive system: Secondary | ICD-10-CM | POA: Diagnosis not present

## 2024-04-01 DIAGNOSIS — M19041 Primary osteoarthritis, right hand: Secondary | ICD-10-CM | POA: Diagnosis not present

## 2024-04-01 DIAGNOSIS — Z881 Allergy status to other antibiotic agents status: Secondary | ICD-10-CM | POA: Diagnosis not present

## 2024-04-01 DIAGNOSIS — Z1152 Encounter for screening for COVID-19: Secondary | ICD-10-CM

## 2024-04-01 DIAGNOSIS — A419 Sepsis, unspecified organism: Secondary | ICD-10-CM | POA: Diagnosis not present

## 2024-04-01 DIAGNOSIS — I82442 Acute embolism and thrombosis of left tibial vein: Secondary | ICD-10-CM | POA: Diagnosis not present

## 2024-04-01 DIAGNOSIS — I82422 Acute embolism and thrombosis of left iliac vein: Secondary | ICD-10-CM | POA: Diagnosis not present

## 2024-04-01 DIAGNOSIS — E440 Moderate protein-calorie malnutrition: Secondary | ICD-10-CM | POA: Diagnosis not present

## 2024-04-01 DIAGNOSIS — R4189 Other symptoms and signs involving cognitive functions and awareness: Secondary | ICD-10-CM | POA: Insufficient documentation

## 2024-04-01 DIAGNOSIS — E785 Hyperlipidemia, unspecified: Secondary | ICD-10-CM | POA: Diagnosis not present

## 2024-04-01 DIAGNOSIS — Z743 Need for continuous supervision: Secondary | ICD-10-CM | POA: Diagnosis not present

## 2024-04-01 DIAGNOSIS — M810 Age-related osteoporosis without current pathological fracture: Secondary | ICD-10-CM | POA: Diagnosis present

## 2024-04-01 DIAGNOSIS — Z6821 Body mass index (BMI) 21.0-21.9, adult: Secondary | ICD-10-CM

## 2024-04-01 DIAGNOSIS — M7989 Other specified soft tissue disorders: Secondary | ICD-10-CM | POA: Diagnosis not present

## 2024-04-01 DIAGNOSIS — Z9842 Cataract extraction status, left eye: Secondary | ICD-10-CM

## 2024-04-01 DIAGNOSIS — K219 Gastro-esophageal reflux disease without esophagitis: Secondary | ICD-10-CM | POA: Diagnosis present

## 2024-04-01 DIAGNOSIS — E871 Hypo-osmolality and hyponatremia: Secondary | ICD-10-CM | POA: Diagnosis not present

## 2024-04-01 DIAGNOSIS — Z86002 Personal history of in-situ neoplasm of other and unspecified genital organs: Secondary | ICD-10-CM | POA: Diagnosis not present

## 2024-04-01 DIAGNOSIS — Z9841 Cataract extraction status, right eye: Secondary | ICD-10-CM | POA: Diagnosis not present

## 2024-04-01 DIAGNOSIS — R4182 Altered mental status, unspecified: Secondary | ICD-10-CM | POA: Diagnosis not present

## 2024-04-01 DIAGNOSIS — F0393 Unspecified dementia, unspecified severity, with mood disturbance: Secondary | ICD-10-CM | POA: Diagnosis not present

## 2024-04-01 DIAGNOSIS — K5900 Constipation, unspecified: Secondary | ICD-10-CM | POA: Diagnosis present

## 2024-04-01 DIAGNOSIS — I82412 Acute embolism and thrombosis of left femoral vein: Secondary | ICD-10-CM | POA: Diagnosis present

## 2024-04-01 DIAGNOSIS — R748 Abnormal levels of other serum enzymes: Secondary | ICD-10-CM | POA: Diagnosis not present

## 2024-04-01 DIAGNOSIS — Z9889 Other specified postprocedural states: Secondary | ICD-10-CM | POA: Diagnosis not present

## 2024-04-01 DIAGNOSIS — B179 Acute viral hepatitis, unspecified: Secondary | ICD-10-CM | POA: Diagnosis not present

## 2024-04-01 DIAGNOSIS — I82432 Acute embolism and thrombosis of left popliteal vein: Secondary | ICD-10-CM

## 2024-04-01 DIAGNOSIS — I6782 Cerebral ischemia: Secondary | ICD-10-CM | POA: Diagnosis not present

## 2024-04-01 DIAGNOSIS — R7989 Other specified abnormal findings of blood chemistry: Secondary | ICD-10-CM | POA: Diagnosis not present

## 2024-04-01 DIAGNOSIS — R609 Edema, unspecified: Secondary | ICD-10-CM | POA: Diagnosis not present

## 2024-04-01 DIAGNOSIS — I771 Stricture of artery: Secondary | ICD-10-CM | POA: Diagnosis not present

## 2024-04-01 DIAGNOSIS — R0689 Other abnormalities of breathing: Secondary | ICD-10-CM | POA: Diagnosis not present

## 2024-04-01 DIAGNOSIS — Z8249 Family history of ischemic heart disease and other diseases of the circulatory system: Secondary | ICD-10-CM

## 2024-04-01 DIAGNOSIS — I700 Atherosclerosis of aorta: Secondary | ICD-10-CM | POA: Diagnosis not present

## 2024-04-01 DIAGNOSIS — I10 Essential (primary) hypertension: Secondary | ICD-10-CM | POA: Diagnosis not present

## 2024-04-01 DIAGNOSIS — Z87891 Personal history of nicotine dependence: Secondary | ICD-10-CM

## 2024-04-01 DIAGNOSIS — F0390 Unspecified dementia without behavioral disturbance: Secondary | ICD-10-CM | POA: Diagnosis not present

## 2024-04-01 DIAGNOSIS — Z79899 Other long term (current) drug therapy: Secondary | ICD-10-CM | POA: Diagnosis not present

## 2024-04-01 DIAGNOSIS — G9340 Encephalopathy, unspecified: Secondary | ICD-10-CM | POA: Diagnosis not present

## 2024-04-01 DIAGNOSIS — F419 Anxiety disorder, unspecified: Secondary | ICD-10-CM | POA: Diagnosis not present

## 2024-04-01 DIAGNOSIS — R000 Tachycardia, unspecified: Secondary | ICD-10-CM | POA: Diagnosis not present

## 2024-04-01 LAB — URINALYSIS, ROUTINE W REFLEX MICROSCOPIC
Bilirubin Urine: NEGATIVE
Glucose, UA: NEGATIVE mg/dL
Hgb urine dipstick: NEGATIVE
Ketones, ur: NEGATIVE mg/dL
Nitrite: NEGATIVE
Protein, ur: 30 mg/dL — AB
Specific Gravity, Urine: 1.03 (ref 1.005–1.030)
pH: 5 (ref 5.0–8.0)

## 2024-04-01 LAB — COMPREHENSIVE METABOLIC PANEL WITH GFR
ALT: 110 U/L — ABNORMAL HIGH (ref 0–44)
AST: 68 U/L — ABNORMAL HIGH (ref 15–41)
Albumin: 2.3 g/dL — ABNORMAL LOW (ref 3.5–5.0)
Alkaline Phosphatase: 169 U/L — ABNORMAL HIGH (ref 38–126)
Anion gap: 11 (ref 5–15)
BUN: 26 mg/dL — ABNORMAL HIGH (ref 8–23)
CO2: 20 mmol/L — ABNORMAL LOW (ref 22–32)
Calcium: 8 mg/dL — ABNORMAL LOW (ref 8.9–10.3)
Chloride: 104 mmol/L (ref 98–111)
Creatinine, Ser: 0.72 mg/dL (ref 0.44–1.00)
GFR, Estimated: >60 mL/min (ref >60)
Glucose, Bld: 112 mg/dL — ABNORMAL HIGH (ref 70–99)
Potassium: 4.7 mmol/L (ref 3.5–5.1)
Sodium: 135 mmol/L (ref 135–145)
Total Bilirubin: 1.6 mg/dL — ABNORMAL HIGH (ref 0.0–1.2)
Total Protein: 6.5 g/dL (ref 6.5–8.1)

## 2024-04-01 LAB — HEPATITIS PANEL, ACUTE
HCV Ab: NONREACTIVE
Hep A IgM: NONREACTIVE
Hep B C IgM: NONREACTIVE
Hepatitis B Surface Ag: NONREACTIVE

## 2024-04-01 LAB — ACETAMINOPHEN LEVEL: Acetaminophen (Tylenol), Serum: <10 ug/mL — ABNORMAL LOW (ref 10–30)

## 2024-04-01 LAB — CBC
HCT: 43.2 % (ref 36.0–46.0)
Hemoglobin: 13.6 g/dL (ref 12.0–15.0)
MCH: 30.7 pg (ref 26.0–34.0)
MCHC: 31.5 g/dL (ref 30.0–36.0)
MCV: 97.5 fL (ref 80.0–100.0)
Platelets: 211 10*3/uL (ref 150–400)
RBC: 4.43 MIL/uL (ref 3.87–5.11)
RDW: 12.2 % (ref 11.5–15.5)
WBC: 9.5 10*3/uL (ref 4.0–10.5)
nRBC: 0 % (ref 0.0–0.2)

## 2024-04-01 LAB — I-STAT CG4 LACTIC ACID, ED: Lactic Acid, Venous: 1.3 mmol/L (ref 0.5–1.9)

## 2024-04-01 LAB — RESP PANEL BY RT-PCR (RSV, FLU A&B, COVID)  RVPGX2
Influenza A by PCR: NEGATIVE
Influenza B by PCR: NEGATIVE
Resp Syncytial Virus by PCR: NEGATIVE
SARS Coronavirus 2 by RT PCR: NEGATIVE

## 2024-04-01 LAB — AMMONIA: Ammonia: 24 umol/L (ref 9–35)

## 2024-04-01 LAB — BRAIN NATRIURETIC PEPTIDE: B Natriuretic Peptide: 147.5 pg/mL — ABNORMAL HIGH (ref 0.0–100.0)

## 2024-04-01 MED ORDER — ONDANSETRON HCL 4 MG PO TABS: 4.0000 mg | ORAL_TABLET | Freq: Four times a day (QID) | ORAL | Status: DC | PRN

## 2024-04-01 MED ORDER — ONDANSETRON HCL 4 MG/2ML IJ SOLN: 4.0000 mg | Freq: Four times a day (QID) | INTRAMUSCULAR | Status: DC | PRN

## 2024-04-01 MED ORDER — SENNA 8.6 MG PO TABS
1.0000 | ORAL_TABLET | Freq: Once | ORAL | Status: AC
  Administered 2024-04-01: 8.6 mg via ORAL
  Filled 2024-04-01: qty 1

## 2024-04-01 MED ORDER — ACETAMINOPHEN 325 MG PO TABS
650.0000 mg | ORAL_TABLET | Freq: Four times a day (QID) | ORAL | Status: DC | PRN
  Administered 2024-04-04: 650 mg via ORAL
  Filled 2024-04-01: qty 2

## 2024-04-01 MED ORDER — SODIUM CHLORIDE 0.9 % IV BOLUS
500.0000 mL | Freq: Once | INTRAVENOUS | Status: AC
  Administered 2024-04-01: 500 mL via INTRAVENOUS

## 2024-04-01 MED ORDER — BISACODYL 10 MG RE SUPP: 10.0000 mg | Freq: Every day | RECTAL | Status: DC | PRN

## 2024-04-01 MED ORDER — SENNOSIDES-DOCUSATE SODIUM 8.6-50 MG PO TABS: 1.0000 | ORAL_TABLET | Freq: Every evening | ORAL | Status: DC | PRN

## 2024-04-01 MED ORDER — HEPARIN BOLUS VIA INFUSION
4000.0000 [IU] | Freq: Once | INTRAVENOUS | Status: AC
  Administered 2024-04-01: 4000 [IU] via INTRAVENOUS
  Filled 2024-04-01: qty 4000

## 2024-04-01 MED ORDER — ACETAMINOPHEN 650 MG RE SUPP: 650.0000 mg | Freq: Four times a day (QID) | RECTAL | Status: DC | PRN

## 2024-04-01 MED ORDER — IOHEXOL 350 MG/ML SOLN
75.0000 mL | Freq: Once | INTRAVENOUS | Status: AC | PRN
  Administered 2024-04-01: 75 mL via INTRAVENOUS

## 2024-04-01 MED ORDER — MELATONIN 3 MG PO TABS: 3.0000 mg | ORAL_TABLET | Freq: Every evening | ORAL | Status: DC | PRN

## 2024-04-01 MED ORDER — HEPARIN (PORCINE) 25000 UT/250ML-% IV SOLN
1200.0000 [IU]/h | INTRAVENOUS | Status: DC
  Administered 2024-04-01: 1000 [IU]/h via INTRAVENOUS
  Administered 2024-04-02 – 2024-04-04 (×3): 1200 [IU]/h via INTRAVENOUS
  Filled 2024-04-01 (×4): qty 250

## 2024-04-01 NOTE — Progress Notes (Signed)
 Lower extremity venous duplex completed. Please see CV Procedures for preliminary results.  Initial findings reported to [employer], Charity fundraiser.  Estanislao Heimlich, RVT 04/01/24 6:14 PM

## 2024-04-01 NOTE — Consult Note (Signed)
 [facility] [place]  ASSESSMENT / PLAN: 83 y.o. female with extensive left lower extremity DVT involving the iliac, femoral, popliteal, and tibial veins.  The patient has marked swelling, but preserved motor and sensory function in the foot.  She has Doppler flow to the foot.  Continue anticoagulation with heparin .  Would recommend mechanical thrombectomy on Monday as long as the condition of the left leg does not deteriorate.  Will follow.  CHIEF COMPLAINT: Swollen left leg  HISTORY OF PRESENT ILLNESS: [name] is a 83 y.o. female who presents to [facility] emergency department for evaluation of 1 week history of encephalopathy, poor oral intake, and lethargy.  The patient's husband, who is at bedside, noted to her left leg became swollen over the last several days.  Given her continued deterioration, he brought her to the care today for evaluation.  Patient has some issues with mild cognitive impairment, but has been more confused lately.  She has never had a DVT before.  No known risk factors for DVT.  No chest pain or shortness of breath at this time.  Past Medical History:  Diagnosis Date   Actinic keratosis 12/03/2012   Allergy     Anxiety    Effexor  helps--psychiatrist Dr Vincente   Arthritis    osteoarthritis, hands   Atypical chest pain    EF 86%, breast attenuation, no significant ischemia   Cataract    Cervical radiculopathy 11/18/2013   Chronic headache    takes Metoprolol  for this   Diverticulosis    Erosive esophagitis    GERD (gastroesophageal reflux disease) 11/2009   EGD: Dr. Jakie: erosive stricture dilated   Hyperlipidemia    Internal hemorrhoids    ISCHEMIC COLITIS 06/05/2008   Qualifier: Diagnosis of  By: Kowalk CMA (AAMA), Leisha     Migraines    Mild chronic ulcerative colitis (HCC)    Osteoporosis 02/2012   t score -2.5 spine   Palpitations    Rotator cuff tendonitis 05/26/2011   VAIN III (vaginal intraepithelial neoplasia  grade III) 08/1997    Past Surgical History:  Procedure Laterality Date   AUGMENTATION MAMMAPLASTY Bilateral    silicone gel implants   Birth mark removed     CATARACT EXTRACTION Bilateral    COLONOSCOPY     COLPOSCOPY     laser vaporization of upper vagina  1998   NM MYOCAR PERF WALL MOTION  01/24/2008   EF 86% neg ischemia   VAGINAL HYSTERECTOMY  1978    Family History  Problem Relation Age of Onset   Heart disease Mother    Heart failure Mother    Breast cancer Mother 21   Prostate cancer Father    Inflammatory bowel disease Sister    Diabetes Maternal Aunt    Colon cancer Paternal Grandfather    Stomach cancer Neg Hx    Pancreatic cancer Neg Hx    Esophageal cancer Neg Hx    Rectal cancer Neg Hx     Social History   Socioeconomic History   Marital status: Married    Spouse name: Not on file   Number of children: 1   Years of education: Not on file   Highest education level: Not on file  Occupational History   Occupation: retired  Tobacco Use   Smoking status: Former    Current packs/day: 0.00    Average packs/day: 1 pack/day for 10.0 years (10.0 ttl pk-yrs)    Types: Cigarettes    Start date: 12/15/1978  Quit date: 12/15/1988    Years since quitting: 35.3   Smokeless tobacco: Never   Tobacco comments:    quit in the 1980's  Vaping Use   Vaping status: Never Used  Substance and Sexual Activity   Alcohol use: Not Currently    Alcohol/week: 14.0 standard drinks of alcohol    Types: 14 Standard drinks or equivalent per week    Comment: drinks wine every day; 2 drinks a day    Drug use: No   Sexual activity: Never    Birth control/protection: Surgical, Post-menopausal    Comment: HYST  Other Topics Concern   Not on file  Social History Narrative   Married, mother of one.  Lives with spouse and 4 dogs.   Very active. Regular exercise  -- yes.  Quit smoking in 1990.     She monitors her diet well.     1-2 glasses of wine almost every night.   Social  Drivers of Corporate Investment Banker Strain: Not on file  Food Insecurity: Not on file  Transportation Needs: Not on file  Physical Activity: Not on file  Stress: Not on file  Social Connections: Not on file  Intimate Partner Violence: Not on file    Allergies  Allergen Reactions   Levaquin  [Levofloxacin  In D5w] Nausea Only    Dizziness and tremulousness    Current Facility-Administered Medications  Medication Dose Route Frequency Provider Last Rate Last Admin   heparin  ADULT infusion 100 units/mL (25000 units/250mL)  1,000 Units/hr Intravenous Continuous Cyndy Ozell DASEN, RPH 10 mL/hr at 04/01/24 1850 1,000 Units/hr at 04/01/24 1850   Current Outpatient Medications  Medication Sig Dispense Refill   acetaminophen  (TYLENOL ) 500 MG tablet Take 1 tablet (500 mg total) by mouth every 8 (eight) hours as needed. 15 tablet 0   amoxicillin -clavulanate (AUGMENTIN ) 875-125 MG tablet Take 1 tablet by mouth every 12 (twelve) hours. (Patient not taking: Reported on 08/19/2022) 14 tablet 0   azelastine  (ASTELIN ) 0.1 % nasal spray USE TWO SPRAYS IN EACH NOSTRIL TWICE DAILY AS DIRECTED 30 mL 5   Bilberry, Vaccinium myrtillus, (BILBERRY PO) Take 1 capsule by mouth daily.     cephALEXin  (KEFLEX ) 500 MG capsule Take 1 capsule (500 mg total) by mouth 3 (three) times daily. 21 capsule 0   Cholecalciferol (D3-1000 PO) Take by mouth.     cyclobenzaprine  (FLEXERIL ) 10 MG tablet Take 1 tablet (10 mg total) by mouth 2 (two) times daily as needed for muscle spasms. 20 tablet 0   fluticasone  (FLONASE ) 50 MCG/ACT nasal spray Place 2 sprays into both nostrils daily. 16 g 6   ibuprofen  (ADVIL ) 400 MG tablet Take 1 tablet (400 mg total) by mouth every 8 (eight) hours as needed. 15 tablet 0   lidocaine  4 % Place 1 patch onto the skin daily. 5 patch 0   metoprolol  succinate (TOPROL -XL) 50 MG 24 hr tablet daily. Takes for headaches     Multiple Vitamins-Minerals (ICAPS AREDS 2 PO) Take by mouth daily.     Multiple  Vitamins-Minerals (ICAPS LUTEIN & ZEAXANTHIN PO) Take by mouth.     omeprazole (PRILOSEC) 40 MG capsule Take 1 capsule by mouth daily.     oxyCODONE -acetaminophen  (PERCOCET/ROXICET) 5-325 MG tablet Take 1 tablet by mouth every 6 (six) hours as needed for severe pain. 6 tablet 0   rosuvastatin  (CRESTOR ) 10 MG tablet      terbinafine  (LAMISIL ) 250 MG tablet Take 1 tablet (250 mg total) by mouth daily. 90 tablet  0   venlafaxine  XR (EFFEXOR -XR) 75 MG 24 hr capsule TAKE ONE CAPSULE BY MOUTH ONCE DAILY WITH  BREAKFAST 90 capsule 3    PHYSICAL EXAM Vitals:   04/01/24 1815 04/01/24 1827 04/01/24 1830 04/01/24 1900  BP: 131/77  136/60 135/69  Pulse: (!) 109  (!) 108 (!) 109  Resp: 18  (!) 22 19  Temp:      TempSrc:      SpO2: 94%  98% 97%  Weight:  59 kg     Elderly woman.  Appears acutely ill. Sinus tachycardia Unlabored breathing Left leg warm and well-perfused, marked swelling from the ankle to the hip.  Motor and sensory function in the left foot and ankle is preserved.  PERTINENT LABORATORY AND RADIOLOGIC DATA  Most recent CBC    Latest Ref Rng & Units 04/01/2024    1:30 PM 03/09/2023   12:05 PM 03/09/2023   11:10 AM  CBC  WBC 4.0 - 10.5 K/uL 9.5   7.1   Hemoglobin 12.0 - 15.0 g/dL 86.3  88.3  86.6   Hematocrit 36.0 - 46.0 % 43.2  34.0  40.3   Platelets 150 - 400 K/uL 211   227      Most recent CMP    Latest Ref Rng & Units 04/01/2024    2:53 PM 03/09/2023   12:05 PM 03/09/2023   11:10 AM  CMP  Glucose 70 - 99 mg/dL 887  90  887   BUN 8 - 23 mg/dL 26  10  14    Creatinine 0.44 - 1.00 mg/dL 9.27  9.59  9.43   Sodium 135 - 145 mmol/L 135  140  135   Potassium 3.5 - 5.1 mmol/L 4.7  3.5  4.0   Chloride 98 - 111 mmol/L 104  107  102   CO2 22 - 32 mmol/L 20   24   Calcium  8.9 - 10.3 mg/dL 8.0   9.2   Total Protein 6.5 - 8.1 g/dL 6.5   8.3   Total Bilirubin 0.0 - 1.2 mg/dL 1.6   1.6   Alkaline Phos 38 - 126 U/L 169   102   AST 15 - 41 U/L 68   20   ALT 0 - 44 U/L 110   19      Renal function CrCl cannot be calculated (Unknown ideal weight.).  Hgb A1c MFr Bld (%)  Date Value  10/27/2017 5.5    LDL Cholesterol  Date Value Ref Range Status  12/01/2017 57 0 - 99 mg/dL Final    Lower extremity venous duplex for DVT: RIGHT:  - No evidence of common femoral vein obstruction.         LEFT:  - Findings consistent with acute deep vein thrombosis involving the left  common femoral vein, SF junction, left femoral vein, left proximal  profunda vein, left popliteal vein, left posterior tibial veins, left  peroneal veins, and EIV.  Findings consistent with acute intramuscular thrombosis involving the left  gastrocnemius veins.    CT scan of the abdomen pelvis with venous phase contrast: Left common iliac and external iliac veins have deep venous thrombosis.  No evidence of May-Thurner or other compressive etiology as cause for DVT seen.  Debby SAILOR. Magda, MD [facility] Phone Number: [phone] 04/01/2024 7:25 PM   Total time spent on preparing this encounter including chart review, data review, collecting history, examining the patient, coordinating care for this new patient, 80  minutes.  Portions of this report may have been transcribed using voice recognition software.  Every effort has been made to ensure accuracy; however, inadvertent computerized transcription errors may still be present.

## 2024-04-01 NOTE — ED Provider Notes (Signed)
 [place] EMERGENCY DEPARTMENT AT [facility] Provider Note   CSN: [id number] Arrival date & time: 04/01/24  1247     History No chief complaint on file.   [name] is a 83 y.o. female with medical history of hypertension, migraines, GERD, GAD, ischemic colitis, anxiety, atypical chest pain.  Patient presents to ED with her husband for evaluation of altered mental status.  Per patient husband, the patient has had decreased function over the last 1 week.  He reports that this all began 1 month ago and they both had URI symptoms, allergies.  He reports that ever since this, the patient's condition has worsened.  He reports that in the last 1 week she has not gotten out of bed, she is eating less, drinking less, ambulating less.  He reports he does have some kind of cognitive decline but reports that in the last 1 week her cognitive decline has acutely worsened.  He reports at 1 point she threw up at home but denies that she has thrown up in the last 2 days.  He is unsure if she has been complaining of abdominal pain, chest pain or shortness of breath.  He reports that she "only takes 3 pills".  Denies any blood thinners.  HPI     Home Medications Prior to Admission medications   Medication Sig Start Date End Date Taking? Authorizing Provider  acetaminophen  (TYLENOL ) 500 MG tablet Take 1 tablet (500 mg total) by mouth every 8 (eight) hours as needed. 03/09/23   Nanavati, Ankit, MD  amoxicillin -clavulanate (AUGMENTIN ) 875-125 MG tablet Take 1 tablet by mouth every 12 (twelve) hours. Patient not taking: Reported on 08/19/2022 06/09/22   Tonya Fredrickson, MD  azelastine  (ASTELIN ) 0.1 % nasal spray USE TWO SPRAYS IN EACH NOSTRIL TWICE DAILY AS DIRECTED 03/15/15   Trudi Fus, Doe-Hyun R, DO  Bilberry, Vaccinium myrtillus, (BILBERRY PO) Take 1 capsule by mouth daily.    [provider]  cephALEXin  (KEFLEX ) 500 MG capsule Take 1 capsule (500 mg total) by mouth 3 (three) times daily.  12/05/22   Charity Conch, DPM  Cholecalciferol (D3-1000 PO) Take by mouth.    [provider]  cyclobenzaprine  (FLEXERIL ) 10 MG tablet Take 1 tablet (10 mg total) by mouth 2 (two) times daily as needed for muscle spasms. 04/01/23   Thomes Flicker, PA  fluticasone  (FLONASE ) 50 MCG/ACT nasal spray Place 2 sprays into both nostrils daily. 09/22/17   Maurie Southern, DO  ibuprofen  (ADVIL ) 400 MG tablet Take 1 tablet (400 mg total) by mouth every 8 (eight) hours as needed. 03/09/23   Deatra Face, MD  lidocaine  4 % Place 1 patch onto the skin daily. 04/01/23   Thomes Flicker, PA  metoprolol  succinate (TOPROL -XL) 50 MG 24 hr tablet daily. Takes for headaches    [provider]  Multiple Vitamins-Minerals (ICAPS AREDS 2 PO) Take by mouth daily.    [provider]  Multiple Vitamins-Minerals (ICAPS LUTEIN & ZEAXANTHIN PO) Take by mouth.    [provider]  omeprazole (PRILOSEC) 40 MG capsule Take 1 capsule by mouth daily.    [provider]  oxyCODONE -acetaminophen  (PERCOCET/ROXICET) 5-325 MG tablet Take 1 tablet by mouth every 6 (six) hours as needed for severe pain. 04/01/23   Thomes Flicker, PA  rosuvastatin  (CRESTOR ) 10 MG tablet     [provider]  terbinafine  (LAMISIL ) 250 MG tablet Take 1 tablet (250 mg total) by mouth daily. 01/03/20   Charity Conch, DPM  venlafaxine  XR (EFFEXOR -XR) 75 MG 24 hr capsule TAKE ONE CAPSULE BY MOUTH ONCE DAILY WITH  BREAKFAST 10/27/17   Maurie Southern, DO      Allergies    Levaquin  [levofloxacin  in d5w]    Review of Systems   Review of Systems  Unable to perform ROS: Mental status change (Level 5 caveat)  All other systems reviewed and are negative.   Physical Exam Updated Vital Signs BP (!) 158/80   Pulse (!) 113   Temp (!) 97 F (36.1 C) (Axillary)   Resp 18   SpO2 97%  Physical Exam Vitals and nursing note reviewed.  Constitutional:      General: She is not in acute distress.    Appearance: She is  well-developed.  HENT:     Head: Normocephalic and atraumatic.  Eyes:     Conjunctiva/sclera: Conjunctivae normal.  Cardiovascular:     Rate and Rhythm: Normal rate and regular rhythm.     Heart sounds: No murmur heard. Pulmonary:     Effort: Pulmonary effort is normal. No respiratory distress.     Breath sounds: Normal breath sounds.  Abdominal:     Palpations: Abdomen is soft.     Tenderness: There is no abdominal tenderness.  Musculoskeletal:        General: No swelling.     Cervical back: Neck supple.     Right lower leg: No edema.     Left lower leg: 1+ Pitting Edema present.  Skin:    General: Skin is warm and dry.     Capillary Refill: Capillary refill takes less than 2 seconds.  Neurological:     General: No focal deficit present.     Mental Status: She is alert and oriented to person, place, and time.     GCS: GCS eye subscore is 4. GCS verbal subscore is 5. GCS motor subscore is 6.     Cranial Nerves: Cranial nerves 2-12 are intact. No cranial nerve deficit.     Sensory: Sensation is intact. No sensory deficit.     Motor: Motor function is intact. No weakness.     Coordination: Coordination is intact. Heel to [facility] Test normal.     Comments: Patient follows commands appropriately.  5 out of 5 strength bilateral upper extremities.  5 out of 5 strength bilateral lower extremities.  Tracks across the midline.  Psychiatric:        Mood and Affect: Mood normal.     ED Results / Procedures / Treatments   Labs (all labs ordered are listed, but only abnormal results are displayed) Labs Reviewed  BRAIN NATRIURETIC PEPTIDE - Abnormal; Notable for the following components:      Result Value   B Natriuretic Peptide 147.5 (*)    All other components within normal limits  CULTURE, BLOOD (ROUTINE X 2)  CULTURE, BLOOD (ROUTINE X 2)  RESP PANEL BY RT-PCR (RSV, FLU A&B, COVID)  RVPGX2  CBC  AMMONIA  URINALYSIS, ROUTINE W REFLEX MICROSCOPIC  COMPREHENSIVE METABOLIC PANEL WITH  GFR  I-STAT CG4 LACTIC ACID, ED  CBG MONITORING, ED  I-STAT CG4 LACTIC ACID, ED    EKG None  Radiology No results found.  Procedures Procedures   Medications Ordered in ED Medications  sodium chloride  0.9 % bolus 500 mL (500 mLs Intravenous New Bag/Given 04/01/24 1410)    ED Course/ Medical Decision Making/ A&P  Medical Decision Making Amount and/or Complexity of Data Reviewed Labs: ordered. Radiology: ordered.   83 year old female presents  for evaluation.  Please see HPI for further details.  On exam patient is tachycardic, afebrile.  Lung sounds are clear bilaterally, nonhypoxic.  Abdomen soft and compressible.  Neurological examination at baseline, patient follows commands appropriately.  Patient has been reports that over the last 4 days to 1 week the patient mental status has deteriorated.  Reports that she usually is able to ambulate without difficulty but is been bedbound recently.  Endorsing decreased p.o. intake.  Will assess patient with CBC, CMP, lactic acid, blood cultures, urinalysis, ammonia, BNP, viral panel, chest x-ray, CT head, ultrasound imaging of left lower extremity.  Patient CBC without leukocytosis or anemia.  Metabolic panel is pending at this time.  Ammonia 24, WNL.  Lactic acid 1.3.  Viral panel pending.  Urinalysis pending.  CT head, ultrasound imaging left lower extremity, chest x-ray pending.  Patient signed out to oncoming resident Dr. Marcy Sexton.  Plan and management discussed.   Final Clinical Impression(s) / ED Diagnoses Final diagnoses:  Altered mental status, unspecified altered mental status type    Rx / DC Orders ED Discharge Orders     None         Kristin Peyer 04/01/24 1514    LongShereen Dike, MD 04/12/24 1729

## 2024-04-01 NOTE — ED Triage Notes (Signed)
 Pt bib ems from home after husband called 911 with concern of kidney failure. Husband reports urine output has decreased. Endorses foul smelling urine. Possible dementia, pt is confused on arrival with some worsening over the last few days. Went to PCP for same and has not heard back. Given approx 200cc NS en route. LLE edema onset 5 days ago, progressive worsening. 126/74 HR 110 RR 20 95% RA ET 24-26 CBG 137 99.30F temporal 12 lead unremarkable

## 2024-04-01 NOTE — ED Provider Notes (Signed)
 Assume Care Note  Vitals  BP 135/69   Pulse (!) 109   Temp (!) 97 F (36.1 C) (Axillary)   Resp 19   Wt 59 kg   SpO2 97%   BMI 21.63 kg/m    ED Course / MDM   Clinical Course as of 04/01/24 1919  Fri Apr 01, 2024  1508 S- hx dementia, pw ~1 week worsening function at home (decreased PO and ambulation), LLE swelling, at baseline mental status but not as aware [ ]  consider PE scan [AO]    Clinical Course User Index [AO] Lorain Robson, MD   Medical Decision Making Amount and/or Complexity of Data Reviewed Labs: ordered. Radiology: ordered.  Risk Prescription drug management.   On examination, patient is pleasantly confused but ill-appearing.  Left lower extremity does appear swollen and warm to the touch without any overlying erythema or purulence, concerning for DVT.  Workup resulted with CMP demonstrating elevated BUN of 26, AST and ALT elevated.  Ammonia appears within normal limits, will order hepatitis panel and Tylenol  level.  Could also be show liver given decreased p.o. intake and dehydration, though she is hypertensive.  Will obtain CT abdomen pelvis, and also CT PE study given concern for DVT and tachycardia.  DVT study resulted with extensive left lower extremity thrombus.  Patient was given heparin  bolus and infusion after confirming no ICH on head CT, no known GI bleeds.  Vascular surgery was also consulted given the extent of her clot with difficult to palpate DP pulse. Dr. Edgardo Goodwill will assess the patient at bedside, but would also like CT venogram, which was ordered.  Patient requires admission for further monitoring and management.  Patient and husband were updated at bedside.  At the time of admission pending, still pending CT angio of the chest as well as CT abdomen pelvis results, and admitting team is aware of this.  Patient seen in conjunction with Dr. Annabell Key, who agreed with the above work-up and plan of care.       Lorain Robson, MD 04/01/24 1919     Early Glisson, MD 04/02/24 1001

## 2024-04-01 NOTE — Progress Notes (Signed)
 PHARMACY - ANTICOAGULATION CONSULT NOTE  Pharmacy Consult for heparin  Indication: pulmonary embolus  Allergies  Allergen Reactions   Levaquin  [Levofloxacin  In D5w] Nausea Only    Dizziness and tremulousness    Patient Measurements: Weight: 59 kg (130 lb)  Vital Signs: Temp: 97 F (36.1 C) (04/18 1300) Temp Source: Axillary (04/18 1300) BP: 143/74 (04/18 1745) Pulse Rate: 109 (04/18 1745)  Labs: Recent Labs    04/01/24 1330 04/01/24 1453  HGB 13.6  --   HCT 43.2  --   PLT 211  --   CREATININE  --  0.72    CrCl cannot be calculated (Unknown ideal weight.).   Medical History: Past Medical History:  Diagnosis Date   Actinic keratosis 12/03/2012   Allergy     Anxiety    Effexor  helps--psychiatrist Dr Deborra Falter   Arthritis    osteoarthritis, hands   Atypical chest pain    EF 86%, breast attenuation, no significant ischemia   Cataract    Cervical radiculopathy 11/18/2013   Chronic headache    takes Metoprolol  for this   Diverticulosis    Erosive esophagitis    GERD (gastroesophageal reflux disease) 11/2009   EGD: Dr. 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 No AC PTA, baseline CBC ok. Pharmacy to dose IV heparin .  Goal of Therapy:  Heparin  level 0.3-0.7 units/ml Monitor platelets by anticoagulation protocol: Yes   Plan:  -Heparin  4000 units x1 -Heparin  1000 units/hr -Check 8-hr heparin  level -Monitor heparin  level, CBC, S/Sx bleeding daily  Levin Reamer, PharmD, BCPS, [facility] Clinical  Pharmacist [phone] Please check AMION for all [facility] Pharmacy numbers 04/01/2024

## 2024-04-01 NOTE — ED Notes (Signed)
 Patient transported to CT

## 2024-04-02 DIAGNOSIS — F0390 Unspecified dementia without behavioral disturbance: Secondary | ICD-10-CM | POA: Diagnosis not present

## 2024-04-02 DIAGNOSIS — K5900 Constipation, unspecified: Secondary | ICD-10-CM

## 2024-04-02 DIAGNOSIS — I82412 Acute embolism and thrombosis of left femoral vein: Secondary | ICD-10-CM

## 2024-04-02 DIAGNOSIS — G9340 Encephalopathy, unspecified: Secondary | ICD-10-CM

## 2024-04-02 DIAGNOSIS — M810 Age-related osteoporosis without current pathological fracture: Secondary | ICD-10-CM

## 2024-04-02 LAB — COMPREHENSIVE METABOLIC PANEL WITH GFR
ALT: 101 U/L — ABNORMAL HIGH (ref 0–44)
AST: 47 U/L — ABNORMAL HIGH (ref 15–41)
Albumin: 2.7 g/dL — ABNORMAL LOW (ref 3.5–5.0)
Alkaline Phosphatase: 186 U/L — ABNORMAL HIGH (ref 38–126)
Anion gap: 13 (ref 5–15)
BUN: 25 mg/dL — ABNORMAL HIGH (ref 8–23)
CO2: 20 mmol/L — ABNORMAL LOW (ref 22–32)
Calcium: 9.3 mg/dL (ref 8.9–10.3)
Chloride: 99 mmol/L (ref 98–111)
Creatinine, Ser: 0.83 mg/dL (ref 0.44–1.00)
GFR, Estimated: >60 mL/min (ref >60)
Glucose, Bld: 111 mg/dL — ABNORMAL HIGH (ref 70–99)
Potassium: 4.7 mmol/L (ref 3.5–5.1)
Sodium: 132 mmol/L — ABNORMAL LOW (ref 135–145)
Total Bilirubin: 1.2 mg/dL (ref 0.0–1.2)
Total Protein: 7.5 g/dL (ref 6.5–8.1)

## 2024-04-02 LAB — MAGNESIUM: Magnesium: 2.3 mg/dL (ref 1.7–2.4)

## 2024-04-02 LAB — CK: Total CK: 40 U/L (ref 38–234)

## 2024-04-02 LAB — CBC
HCT: 37.3 % (ref 36.0–46.0)
Hemoglobin: 12.5 g/dL (ref 12.0–15.0)
MCH: 30.5 pg (ref 26.0–34.0)
MCHC: 33.5 g/dL (ref 30.0–36.0)
MCV: 91 fL (ref 80.0–100.0)
Platelets: 231 10*3/uL (ref 150–400)
RBC: 4.1 MIL/uL (ref 3.87–5.11)
RDW: 12.3 % (ref 11.5–15.5)
WBC: 10.8 10*3/uL — ABNORMAL HIGH (ref 4.0–10.5)
nRBC: 0 % (ref 0.0–0.2)

## 2024-04-02 LAB — HEPARIN LEVEL (UNFRACTIONATED)
Heparin Unfractionated: 0.38 [IU]/mL (ref 0.30–0.70)
Heparin Unfractionated: 0.29 [IU]/mL — ABNORMAL LOW (ref 0.30–0.70)
Heparin Unfractionated: 0.41 [IU]/mL (ref 0.30–0.70)

## 2024-04-02 LAB — TSH: TSH: 3.182 u[IU]/mL (ref 0.350–4.500)

## 2024-04-02 MED ORDER — METOPROLOL SUCCINATE ER 50 MG PO TB24
50.0000 mg | ORAL_TABLET | Freq: Every day | ORAL | Status: DC
  Administered 2024-04-02 – 2024-04-07 (×6): 50 mg via ORAL
  Filled 2024-04-02 (×6): qty 1

## 2024-04-02 MED ORDER — SODIUM CHLORIDE 0.9 % IV SOLN: INTRAVENOUS | Status: DC

## 2024-04-02 MED ORDER — PANTOPRAZOLE SODIUM 40 MG PO TBEC
40.0000 mg | DELAYED_RELEASE_TABLET | Freq: Every day | ORAL | Status: DC
  Administered 2024-04-02 – 2024-04-07 (×6): 40 mg via ORAL
  Filled 2024-04-02 (×6): qty 1

## 2024-04-02 MED ORDER — VENLAFAXINE HCL ER 75 MG PO CP24
75.0000 mg | ORAL_CAPSULE | Freq: Every day | ORAL | Status: DC
  Administered 2024-04-02 – 2024-04-04 (×3): 75 mg via ORAL
  Filled 2024-04-02 (×3): qty 1

## 2024-04-02 NOTE — Progress Notes (Signed)
 PHARMACY - ANTICOAGULATION CONSULT NOTE  Pharmacy Consult for heparin  Indication: pulmonary embolus  Allergies  Allergen Reactions   Levaquin  [Levofloxacin  In D5w] Nausea Only    Dizziness and tremulousness    Patient Measurements: Height: 5\' 5"  (165.1 cm) Weight: 59 kg (130 lb) IBW/kg (Calculated) : 57 HEPARIN  DW (KG): 59  Vital Signs: Temp: 99.6 F (37.6 C) (04/19 2000) Temp Source: Oral (04/19 2000) BP: 130/73 (04/19 2000)  Labs: Recent Labs    04/01/24 1330 04/01/24 1453 04/02/24 0357 04/02/24 1118 04/02/24 2104  HGB 13.6  --  12.5  --   --   HCT 43.2  --  37.3  --   --   PLT 211  --  231  --   --   HEPARINUNFRC  --   --  0.38 0.29* 0.41  CREATININE  --  0.72 0.83  --   --   CKTOTAL  --   --  40  --   --     Estimated Creatinine Clearance: 47 mL/min (by C-G formula based on SCr of 0.83 mg/dL).   Medical History: Past Medical History:  Diagnosis Date   Actinic keratosis 12/03/2012   Allergy     Anxiety    Effexor  helps--psychiatrist Dr Deborra Falter   Arthritis    osteoarthritis, hands   Atypical chest pain    EF 86%, breast attenuation, no significant ischemia   Cataract    Cervical radiculopathy 11/18/2013   Chronic headache    takes Metoprolol  for this   Diverticulosis    Erosive esophagitis    GERD (gastroesophageal reflux disease) 11/2009   EGD: Dr. 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 No AC PTA, baseline CBC ok. Pharmacy to dose IV heparin .  This afternoon, heparin  level 0.29  (subtherapeutic) with heparin  at 1000 units/hour. CBC - hgb 12.5 and plt 231. No issues reported.   4/19 PM update: HL: 0.41 Hgb / PLT wnl No signs of bleeding or issues w/ hep gtt  Goal of Therapy:  Heparin  level 0.3-0.7 units/ml Monitor platelets by anticoagulation protocol: Yes   Plan:  -Continue heparin  at 1200 units/hr -Monitor heparin  level, CBC, S/Sx bleeding daily  Youcef Klas BS, PharmD, BCPS Clinical Pharmacist 04/02/2024 9:42 PM  Contact: [phone] after 3 PM  "Be curious, not judgmental..." -Rumalda Counter

## 2024-04-02 NOTE — Progress Notes (Signed)
 PHARMACY - ANTICOAGULATION CONSULT NOTE  Pharmacy Consult for heparin  Indication: pulmonary embolus  Allergies  Allergen Reactions   Levaquin  [Levofloxacin  In D5w] Nausea Only    Dizziness and tremulousness    Patient Measurements: Height: 5\' 5"  (165.1 cm) Weight: 59 kg (130 lb) IBW/kg (Calculated) : 57 HEPARIN  DW (KG): 59  Vital Signs: Temp: 97.7 F (36.5 C) (04/19 1209) Temp Source: Oral (04/19 1209) BP: 137/66 (04/19 1209)  Labs: Recent Labs    04/01/24 1330 04/01/24 1453 04/02/24 0357 04/02/24 1118  HGB 13.6  --  12.5  --   HCT 43.2  --  37.3  --   PLT 211  --  231  --   HEPARINUNFRC  --   --  0.38 0.29*  CREATININE  --  0.72 0.83  --   CKTOTAL  --   --  40  --     Estimated Creatinine Clearance: 47 mL/min (by C-G formula based on SCr of 0.83 mg/dL).   Medical History: Past Medical History:  Diagnosis Date   Actinic keratosis 12/03/2012   Allergy     Anxiety    Effexor  helps--psychiatrist Dr Deborra Falter   Arthritis    osteoarthritis, hands   Atypical chest pain    EF 86%, breast attenuation, no significant ischemia   Cataract    Cervical radiculopathy 11/18/2013   Chronic headache    takes Metoprolol  for this   Diverticulosis    Erosive esophagitis    GERD (gastroesophageal reflux disease) 11/2009   EGD: Dr. 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 No AC PTA, baseline CBC ok. Pharmacy to dose IV heparin .  This afternoon, heparin  level 0.29 (subtherapeutic) with heparin  at 1000 units/hour. CBC  - hgb 12.5 and plt 231. No issues reported.   Goal of Therapy:  Heparin  level 0.3-0.7 units/ml Monitor platelets by anticoagulation protocol: Yes   Plan:  -Increase heparin  to 1200 units/hr -Check 8-hr heparin  level -Monitor heparin  level, CBC, S/Sx bleeding daily  Adaline Ada, PharmD PGY1 Pharmacy Resident 04/02/2024 12:56 PM

## 2024-04-02 NOTE — Progress Notes (Signed)
 [facility] [place] PROGRESS NOTE  ASSESSMENT / PLAN: [name] is a 83 y.o. female with extensive iliac, femoral, popliteal, and tibial vein DVT in the left lower extremity.  Overall the leg looks better today after 12 hours of heparin  infusion.  The patient is somnolent this morning, and cannot participate much in exam, but her husband reports fairly normal motor and sensory function of the left foot.  Would continue heparin  infusion and IV hydration.  Plan mechanical thrombectomy Monday as long as overall clinical picture continues to improve.  SUBJECTIVE: Somnolent.  Not very interactive.  Husband and son at bedside.  Husband reports improvement in the left leg.  OBJECTIVE: BP 132/73 (BP Location: Left Arm)   Pulse (!) 106   Temp 98.4 F (36.9 C) (Axillary)   Resp 18   Ht 5\' 5"  (1.651 m)   Wt 59 kg   SpO2 92%   BMI 21.63 kg/m   Intake/Output Summary (Last 24 hours) at 04/02/2024 0922 Last data filed at 04/02/2024 0848 Gross per 24 hour  Intake 630.67 ml  Output 151 ml  Net 479.67 ml    Early woman.  Somnolent.  Not interactive. Mild sinus tachycardia Unlabored breathing Left leg overall feels improved.  There is less swelling.  The skin is less taut.     Latest Ref Rng & Units 04/02/2024    3:57 AM 04/01/2024    1:30 PM 03/09/2023   12:05 PM  CBC  WBC 4.0 - 10.5 K/uL 10.8  9.5    Hemoglobin 12.0 - 15.0 g/dL 40.9  81.1  91.4   Hematocrit 36.0 - 46.0 % 37.3  43.2  34.0   Platelets 150 - 400 K/uL 231  211          Latest Ref Rng & Units 04/02/2024    3:57 AM 04/01/2024    2:53 PM 03/09/2023   12:05 PM  CMP  Glucose 70 - 99 mg/dL 782  956  90   BUN 8 - 23 mg/dL 25  26  10    Creatinine 0.44 - 1.00 mg/dL 2.13  0.86  5.78   Sodium 135 - 145 mmol/L 132  135  140   Potassium 3.5 - 5.1 mmol/L 4.7  4.7  3.5   Chloride 98 - 111 mmol/L 99  104  107   CO2 22 - 32 mmol/L 20  20    Calcium  8.9 - 10.3 mg/dL 9.3  8.0    Total Protein 6.5 - 8.1 g/dL  7.5  6.5    Total Bilirubin 0.0 - 1.2 mg/dL 1.2  1.6    Alkaline Phos 38 - 126 U/L 186  169    AST 15 - 41 U/L 47  68    ALT 0 - 44 U/L 101  110      Estimated Creatinine Clearance: 47 mL/min (by C-G formula based on SCr of 0.83 mg/dL).  Heber Little. Edgardo Goodwill, MD [facility] Phone Number: [phone] 04/02/2024 9:22 AM

## 2024-04-02 NOTE — H&P (Addendum)
 History and Physical    Patient: [name]:[id number] DOB: [birth year] DOA: 04/01/2024 DOS: the patient was seen and examined on 04/02/2024 PCP: Alejandro Hurt, FNP  Patient coming from: Home  Chief Complaint: No chief complaint on file.  HPI: [name] is a 83 y.o. female with medical history significant for hypertension, GERD and likely undiagnosed dementia who was brought in by her husband because her left leg is significantly swollen.  It was significant yesterday and he called the clinic about it but today it was worse so he called 911.  She stopped walking 2 days ago. The patient's husband is the one who provides the history as the patient is not talking.  He reports that he states she stopped talking 5 days ago.  At baseline she is frequently confused but does recognize him and does talk to him.  She ambulated in the yard, in the driveway, and in the house before this happened. She did take care of her personal ADLs such as showering.  He does all the cooking. He denies any falls or trauma.   Review of Systems: unable to review all systems due to the inability of the patient to answer questions. Past Medical History:  Diagnosis Date   Actinic keratosis 12/03/2012   Allergy     Anxiety    Effexor  helps--psychiatrist Dr Deborra Falter   Arthritis    osteoarthritis, hands   Atypical chest pain    EF 86%, breast attenuation, no significant ischemia   Cataract    Cervical radiculopathy 11/18/2013   Chronic headache    takes Metoprolol  for this   Diverticulosis    Erosive esophagitis    GERD (gastroesophageal reflux disease) 11/2009   EGD: Dr. 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 Her smoking use included cigarettes. She started smoking about 45 years ago. She has a 10 pack-year smoking history. She has never used smokeless tobacco. She reports that she does not currently use alcohol after a past usage of about 14.0 standard drinks of alcohol per week. She reports that she does not use drugs.  Allergies  Allergen Reactions   Levaquin  [Levofloxacin  In D5w] Nausea Only    Dizziness and tremulousness    Family History  Problem Relation Age of Onset   Heart disease Mother    Heart failure Mother    Breast cancer Mother 89   Prostate cancer Father    Inflammatory bowel disease Sister    Diabetes Maternal Aunt    Colon cancer Paternal Grandfather    Stomach cancer Neg Hx    Pancreatic cancer Neg Hx  Esophageal cancer Neg Hx    Rectal cancer Neg Hx     Prior to Admission medications   Medication Sig Start Date End Date Taking? Authorizing Provider  acetaminophen  (TYLENOL ) 500 MG tablet Take 1 tablet (500 mg total) by mouth every 8 (eight) hours as needed. 03/09/23  Yes Deatra Face, MD  ibuprofen  (ADVIL ) 400 MG tablet Take 1 tablet (400 mg total) by mouth every 8 (eight) hours as needed. 03/09/23  Yes Deatra Face, MD  lactose free nutrition (BOOST) LIQD Take 237 mLs by mouth 3 (three) times daily between meals.   Yes [provider]  metoprolol  succinate (TOPROL -XL) 50 MG 24 hr tablet Take 50 mg by mouth daily.   Yes [provider]  omeprazole (PRILOSEC) 40 MG capsule Take 1  capsule by mouth daily.   Yes [provider]  Pedialyte (PEDIALYTE) SOLN Take 240 mLs by mouth daily.   Yes [provider]  rosuvastatin  (CRESTOR ) 10 MG tablet Take 10 mg by mouth daily.   Yes [provider]  venlafaxine  XR (EFFEXOR -XR) 75 MG 24 hr capsule TAKE ONE CAPSULE BY MOUTH ONCE DAILY WITH  BREAKFAST 10/27/17  Yes Maurie Southern, DO    Physical Exam: Vitals:   04/01/24 2000 04/01/24 2030 04/01/24 2100 04/01/24 2203  BP: (!) 143/79 134/81 130/84 (!) 151/79  Pulse: (!) 110 (!) 108 (!) 106   Resp: 16 20 (!) 21 20  Temp:    98.1 F (36.7 C)  TempSrc:    Oral  SpO2: 96% 100% 92%   Weight:    59 kg  Height:    5\' 5"  (1.651 m)   Physical Exam:  General: No acute distress, well developed, well nourished HEENT: Normocephalic, atraumatic, PERRL Cardiovascular: Normal rate and rhythm. Distal pulses not palp on left Pulmonary: Normal pulmonary effort, normal breath sounds Gastrointestinal: Nondistended abdomen, soft, non-tender, normoactive bowel sounds Musculoskeletal: left leg is >2x the size of the right. Lymphadenopathy: No cervical LAD. Skin: Skin is warm and dry. Neuro: awake but not talking or following commands PSYCH: Inattentive, smiles but does not talk  Data Reviewed:  Results for orders placed or performed during the hospital encounter of 04/01/24 (from the past 24 hours)  Resp panel by RT-PCR (RSV, Flu A&B, Covid) Anterior Nasal Swab     Status: None   Collection Time: 04/01/24  1:24 PM   Specimen: Anterior Nasal Swab  Result Value Ref Range   SARS Coronavirus 2 by RT PCR NEGATIVE NEGATIVE   Influenza A by PCR NEGATIVE NEGATIVE   Influenza B by PCR NEGATIVE NEGATIVE   Resp Syncytial Virus by PCR NEGATIVE NEGATIVE  CBC     Status: None   Collection Time: 04/01/24  1:30 PM  Result Value Ref Range   WBC 9.5 4.0 - 10.5 K/uL   RBC 4.43 3.87 - 5.11 MIL/uL   Hemoglobin 13.6 12.0 - 15.0 g/dL   HCT 40.9 81.1 - 91.4 %   MCV 97.5 80.0 -  100.0 fL   MCH 30.7 26.0 - 34.0 pg   MCHC 31.5 30.0 - 36.0 g/dL   RDW 78.2 95.6 - 21.3 %   Platelets 211 150 - 400 K/uL   nRBC 0.0 0.0 - 0.2 %  Brain natriuretic peptide     Status: Abnormal   Collection Time: 04/01/24  1:30 PM  Result Value Ref Range   B Natriuretic Peptide 147.5 (H) 0.0 - 100.0 pg/mL  Ammonia     Status: None   Collection  Time: 04/01/24  1:30 PM  Result Value Ref Range   Ammonia 24 9 - 35 umol/L  I-Stat CG4 Lactic Acid     Status: None   Collection Time: 04/01/24  1:40 PM  Result Value Ref Range   Lactic Acid, Venous 1.3 0.5 - 1.9 mmol/L  Comprehensive metabolic panel     Status: Abnormal   Collection Time: 04/01/24  2:53 PM  Result Value Ref Range   Sodium 135 135 - 145 mmol/L   Potassium 4.7 3.5 - 5.1 mmol/L   Chloride 104 98 - 111 mmol/L   CO2 20 (L) 22 - 32 mmol/L   Glucose, Bld 112 (H) 70 - 99 mg/dL   BUN 26 (H) 8 - 23 mg/dL   Creatinine, Ser 1.61 0.44 - 1.00 mg/dL   Calcium  8.0 (L) 8.9 - 10.3 mg/dL   Total Protein 6.5 6.5 - 8.1 g/dL   Albumin 2.3 (L) 3.5 - 5.0 g/dL   AST 68 (H) 15 - 41 U/L   ALT 110 (H) 0 - 44 U/L   Alkaline Phosphatase 169 (H) 38 - 126 U/L   Total Bilirubin 1.6 (H) 0.0 - 1.2 mg/dL   GFR, Estimated >09 >60 mL/min   Anion gap 11 5 - 15  Urinalysis, Routine w reflex microscopic -Urine, Clean Catch     Status: Abnormal   Collection Time: 04/01/24  5:15 PM  Result Value Ref Range   Color, Urine AMBER (A) YELLOW   APPearance HAZY (A) CLEAR   Specific Gravity, Urine 1.030 1.005 - 1.030   pH 5.0 5.0 - 8.0   Glucose, UA NEGATIVE NEGATIVE mg/dL   Hgb urine dipstick NEGATIVE NEGATIVE   Bilirubin Urine NEGATIVE NEGATIVE   Ketones, ur NEGATIVE NEGATIVE mg/dL   Protein, ur 30 (A) NEGATIVE mg/dL   Nitrite NEGATIVE NEGATIVE   Leukocytes,Ua MODERATE (A) NEGATIVE   RBC / HPF 0-5 0 - 5 RBC/hpf   WBC, UA 21-50 0 - 5 WBC/hpf   Bacteria, UA RARE (A) NONE SEEN   Squamous Epithelial / HPF 11-20 0 - 5 /HPF  Acetaminophen  level     Status:  Abnormal   Collection Time: 04/01/24  6:50 PM  Result Value Ref Range   Acetaminophen  (Tylenol ), Serum <10 (L) 10 - 30 ug/mL  Hepatitis panel, acute     Status: None   Collection Time: 04/01/24  7:00 PM  Result Value Ref Range   Hepatitis B Surface Ag NON REACTIVE NON REACTIVE   HCV Ab NON REACTIVE NON REACTIVE   Hep A IgM NON REACTIVE NON REACTIVE   Hep B C IgM NON REACTIVE NON REACTIVE     Assessment and Plan: Extensive left-sided DVT involving the common iliac vein, external and internal iliac veins in the abdomen extending down into the left leg from femoral vein down to the posterior tibial veins. - Appreciate vascular surgery input who is planning a mechanical thrombectomy on Monday - Continue IV Heparin  drip  - CTA negative for PE - She does have intramuscular edema of the left thigh.  Monitor carefully.  2. Acute encephalopathy on chronic dementia - Monitor  3. Elevated LFTs - etiology unclear.  Tylenol  level and hepatitis panel negative. - Hold statin - Monitor    Advance Care Planning:   Code Status: Full Code  The patient's husband reports that they do not have advanced directives.  He is her next of kin but not healthcare power of attorney.  She will be full code by default at  this time  Consults: Vascular surgery  Family Communication: The patient's husband was at bedside  Severity of Illness: The appropriate patient status for this patient is INPATIENT. Inpatient status is judged to be reasonable and necessary in order to provide the required intensity of service to ensure the patient's safety. The patient's presenting symptoms, physical exam findings, and initial radiographic and laboratory data in the context of their chronic comorbidities is felt to place them at high risk for further clinical deterioration. Furthermore, it is not anticipated that the patient will be medically stable for discharge from the hospital within 2 midnights of admission.   * I certify  that at the point of admission it is my clinical judgment that the patient will require inpatient hospital care spanning beyond 2 midnights from the point of admission due to high intensity of service, high risk for further deterioration and high frequency of surveillance required.*  Author: Willadean Hark, MD 04/02/2024 12:44 AM  For on call review [email or web address].

## 2024-04-02 NOTE — Progress Notes (Addendum)
  PROGRESS NOTE  Patient admitted earlier this morning. See H&P.   Patient is an 83 year old female who presented with left lower leg swelling.  Imaging revealed extensive DVT.  She was started on IV heparin  and vascular surgery consulted.  Patient seen, is alert and has no complaints.  She is oriented to self only, does not know why she is in the hospital or what is going on with her leg.  She does have dementia at baseline.  Son is at bedside.  Continue IV heparin .  Vascular surgery planning for mechanical thrombectomy on Monday.  Poor PO intake, family inquired about starting IVF.   Status is: Inpatient Remains inpatient appropriate because: IV heparin    Daren Eck, DO [facility] 04/02/2024, 11:18 AM  Available via Epic secure chat 7am-7pm After these hours, please refer to coverage provider listed on [email or web address]

## 2024-04-02 NOTE — Progress Notes (Signed)
 PHARMACY - ANTICOAGULATION  Pharmacy Consult for heparin  Indication: DVT Brief A/P: Heparin  level within goal range Continue Heparin  at current rate   Allergies  Allergen Reactions   Levaquin  [Levofloxacin  In D5w] Nausea Only    Dizziness and tremulousness    Patient Measurements: Height: 5\' 5"  (165.1 cm) Weight: 59 kg (130 lb) IBW/kg (Calculated) : 57 HEPARIN  DW (KG): 59  Vital Signs: Temp: 97.7 F (36.5 C) (04/19 0300) Temp Source: Oral (04/19 0300) BP: 134/71 (04/19 0300) Pulse Rate: 106 (04/18 2100)  Labs: Recent Labs    04/01/24 1330 04/01/24 1453 04/02/24 0357  HGB 13.6  --  12.5  HCT 43.2  --  37.3  PLT 211  --  231  HEPARINUNFRC  --   --  0.38  CREATININE  --  0.72 0.83  CKTOTAL  --   --  40    Estimated Creatinine Clearance: 47 mL/min (by C-G formula based on SCr of 0.83 mg/dL).  Assessment: 83 y.o. female with LLE DVT awaiting thrombectomy for heparin   Goal of Therapy:  Heparin  level 0.3-0.7 units/ml Monitor platelets by anticoagulation protocol: Yes   Plan:  No change to heparin  for now Check heparin  level in 8 hours.   Claudine Cullens, PharmD, BCPS  04/02/2024

## 2024-04-02 NOTE — Plan of Care (Signed)

## 2024-04-03 DIAGNOSIS — K5900 Constipation, unspecified: Secondary | ICD-10-CM | POA: Diagnosis not present

## 2024-04-03 DIAGNOSIS — I82412 Acute embolism and thrombosis of left femoral vein: Secondary | ICD-10-CM | POA: Diagnosis not present

## 2024-04-03 DIAGNOSIS — R4189 Other symptoms and signs involving cognitive functions and awareness: Secondary | ICD-10-CM | POA: Insufficient documentation

## 2024-04-03 LAB — COMPREHENSIVE METABOLIC PANEL WITH GFR
ALT: 127 U/L — ABNORMAL HIGH (ref 0–44)
AST: 97 U/L — ABNORMAL HIGH (ref 15–41)
Albumin: 2.3 g/dL — ABNORMAL LOW (ref 3.5–5.0)
Alkaline Phosphatase: 243 U/L — ABNORMAL HIGH (ref 38–126)
Anion gap: 12 (ref 5–15)
BUN: 22 mg/dL (ref 8–23)
CO2: 21 mmol/L — ABNORMAL LOW (ref 22–32)
Calcium: 8.5 mg/dL — ABNORMAL LOW (ref 8.9–10.3)
Chloride: 97 mmol/L — ABNORMAL LOW (ref 98–111)
Creatinine, Ser: 0.78 mg/dL (ref 0.44–1.00)
GFR, Estimated: >60 mL/min (ref >60)
Glucose, Bld: 102 mg/dL — ABNORMAL HIGH (ref 70–99)
Potassium: 4.2 mmol/L (ref 3.5–5.1)
Sodium: 130 mmol/L — ABNORMAL LOW (ref 135–145)
Total Bilirubin: 1 mg/dL (ref 0.0–1.2)
Total Protein: 6.8 g/dL (ref 6.5–8.1)

## 2024-04-03 LAB — HEPARIN LEVEL (UNFRACTIONATED): Heparin Unfractionated: 0.46 [IU]/mL (ref 0.30–0.70)

## 2024-04-03 LAB — CBC
HCT: 33.1 % — ABNORMAL LOW (ref 36.0–46.0)
Hemoglobin: 11.3 g/dL — ABNORMAL LOW (ref 12.0–15.0)
MCH: 30.6 pg (ref 26.0–34.0)
MCHC: 34.1 g/dL (ref 30.0–36.0)
MCV: 89.7 fL (ref 80.0–100.0)
Platelets: 252 10*3/uL (ref 150–400)
RBC: 3.69 MIL/uL — ABNORMAL LOW (ref 3.87–5.11)
RDW: 12.3 % (ref 11.5–15.5)
WBC: 7.6 10*3/uL (ref 4.0–10.5)
nRBC: 0 % (ref 0.0–0.2)

## 2024-04-03 NOTE — Progress Notes (Signed)
 PHARMACY - ANTICOAGULATION CONSULT NOTE  Pharmacy Consult for heparin  Indication: DVT  Allergies  Allergen Reactions   Levaquin  [Levofloxacin  In D5w] Nausea Only    Dizziness and tremulousness    Patient Measurements: Height: 5\' 5"  (165.1 cm) Weight: 59 kg (130 lb) IBW/kg (Calculated) : 57 HEPARIN  DW (KG): 59  Vital Signs: Temp: 97.6 F (36.4 C) (04/20 0409) Temp Source: Oral (04/20 0409) BP: 133/61 (04/20 0409) Pulse Rate: 87 (04/20 0409)  Labs: Recent Labs    04/01/24 1330 04/01/24 1330 04/01/24 1453 04/02/24 0357 04/02/24 1118 04/02/24 2104 04/03/24 0520  HGB 13.6  --   --  12.5  --   --  11.3*  HCT 43.2  --   --  37.3  --   --  33.1*  PLT 211  --   --  231  --   --  252  HEPARINUNFRC  --    < >  --  0.38 0.29* 0.41 0.46  CREATININE  --   --  0.72 0.83  --   --  0.78  CKTOTAL  --   --   --  40  --   --   --    < > = values in this interval not displayed.    Estimated Creatinine Clearance: 48.8 mL/min (by C-G formula based on SCr of 0.78 mg/dL).   Medical History: Past Medical History:  Diagnosis Date   Actinic keratosis 12/03/2012   Allergy     Anxiety    Effexor  helps--psychiatrist Dr Deborra Falter   Arthritis    osteoarthritis, hands   Atypical chest pain    EF 86%, breast attenuation, no significant ischemia   Cataract    Cervical radiculopathy 11/18/2013   Chronic headache    takes Metoprolol  for this   Diverticulosis    Erosive esophagitis    GERD (gastroesophageal reflux disease) 11/2009   EGD: Dr. 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 No AC PTA. Pharmacy to dose IV heparin .  This morning, heparin  level 0.46 (therapeutic) with heparin  at 1200 units/hour. CBC - hgb 11.3 and plt 252. No issues reported.   Goal of Therapy:  Heparin  level 0.3-0.7 units/ml Monitor platelets by anticoagulation protocol: Yes   Plan:  -Continue heparin  at 1200 units/hr -Monitor heparin  level, CBC, and S/Sx bleeding daily  Adaline Ada, PharmD PGY1 Pharmacy Resident 04/03/2024 7:51 AM

## 2024-04-03 NOTE — Progress Notes (Signed)
 [facility] [place] PROGRESS NOTE  ASSESSMENT / PLAN: [name] is a 83 y.o. female with extensive iliac, femoral, popliteal, and tibial vein DVT in the left lower extremity.  Overall the leg looks better today after 36 hours of heparin  infusion.  Normal motor / sensory function in foot. Would continue heparin  infusion and IV hydration.  Plan mechanical thrombectomy Monday.  SUBJECTIVE: More awake today. Reports leg feels a bit better. Reviewed plan for OR.  OBJECTIVE: BP 128/68 (BP Location: Left Arm)   Pulse 63   Temp 97.9 F (36.6 C) (Oral)   Resp (!) 25   Ht 5\' 5"  (1.651 m)   Wt 59 kg   SpO2 97%   BMI 21.63 kg/m   Intake/Output Summary (Last 24 hours) at 04/03/2024 1531 Last data filed at 04/02/2024 2356 Gross per 24 hour  Intake 363.77 ml  Output 300 ml  Net 63.77 ml    Elderly woman in no distress. Mild sinus tachycardia Unlabored breathing Left leg overall feels improved.  There is less swelling.  The skin is less taut. Moves the foot and toes easily. Normal sensation.     Latest Ref Rng & Units 04/03/2024    5:20 AM 04/02/2024    3:57 AM 04/01/2024    1:30 PM  CBC  WBC 4.0 - 10.5 K/uL 7.6  10.8  9.5   Hemoglobin 12.0 - 15.0 g/dL 16.1  09.6  04.5   Hematocrit 36.0 - 46.0 % 33.1  37.3  43.2   Platelets 150 - 400 K/uL 252  231  211         Latest Ref Rng & Units 04/03/2024    5:20 AM 04/02/2024    3:57 AM 04/01/2024    2:53 PM  CMP  Glucose 70 - 99 mg/dL 409  811  914   BUN 8 - 23 mg/dL 22  25  26    Creatinine 0.44 - 1.00 mg/dL 7.82  9.56  2.13   Sodium 135 - 145 mmol/L 130  132  135   Potassium 3.5 - 5.1 mmol/L 4.2  4.7  4.7   Chloride 98 - 111 mmol/L 97  99  104   CO2 22 - 32 mmol/L 21  20  20    Calcium  8.9 - 10.3 mg/dL 8.5  9.3  8.0   Total Protein 6.5 - 8.1 g/dL 6.8  7.5  6.5   Total Bilirubin 0.0 - 1.2 mg/dL 1.0  1.2  1.6   Alkaline Phos 38 - 126 U/L 243  186  169   AST 15 - 41 U/L 97  47  68   ALT 0 - 44 U/L 127  101   110     Estimated Creatinine Clearance: 48.8 mL/min (by C-G formula based on SCr of 0.78 mg/dL).  Heber Little. Edgardo Goodwill, MD [facility] Phone Number: [phone] 04/03/2024 3:31 PM

## 2024-04-03 NOTE — Progress Notes (Signed)
 PROGRESS NOTE  [name] BJY:[id number] DOB: [birth year]   PCP: Alejandro Hurt, FNP  Patient is from: Home.  Lives with husband.  Dependent for ADLs.  DOA: 04/01/2024 LOS: 2  Chief complaints No chief complaint on file.    Brief Narrative / Interim history: 83 year old F with PMH of cognitive impairment, HTN and GERD presenting with progressive left leg swelling and pain, and admitted with extensive left lower extremity DVT.  Patient was started on IV heparin .  Vascular surgery consulted.  Plan for mechanical thrombectomy on 4/21.  CT angio chest negative for PE.  Subjective: Seen and examined earlier this morning.  No major events overnight of this morning.  No complaints.  She denies pain.  She is awake and alert and oriented to self, place and her son.  No insight into why she is in the hospital.  Objective: Vitals:   04/02/24 2000 04/02/24 2353 04/03/24 0409 04/03/24 0839  BP: 130/73 135/65 133/61 127/63  Pulse:  96 87 95  Resp: 20 19 18 19   Temp: 99.6 F (37.6 C) 99.9 F (37.7 C) 97.6 F (36.4 C) 98 F (36.7 C)  TempSrc: Oral Oral Oral Oral  SpO2: 95% 96%  98%  Weight:      Height:        Examination:  GENERAL: No apparent distress.  Nontoxic. HEENT: MMM.  Vision and hearing grossly intact.  NECK: Supple.  No apparent JVD.  RESP:  No IWOB.  Fair aeration bilaterally. CVS:  RRR. Heart sounds normal.  ABD/GI/GU: BS+. Abd soft, NTND.  MSK/EXT:  Moves extremities.  Significantly swollen LLE.  DP pulses 2+ bilaterally. SKIN: no apparent skin lesion or wound NEURO: Awake and alert.  Oriented to self, place and son.  Follows commands.  No apparent focal neuro deficit. PSYCH: Calm. Normal affect.   Consultants:  Vascular surgery  Procedures: None  Microbiology summarized: None  Assessment and plan: Extensive left-sided DVT involving the common iliac vein, external and internal iliac veins in the abdomen extending down into the left leg from femoral vein  down to the posterior tibial veins.  Likely due to immobility.  CT angio chest negative for PE. -Continue IV heparin  -Plan for mechanical thrombectomy by vascular surgery on 4/21   Dementia without behavioral disturbance: Not formally diagnosed.  Awake and alert.  Oriented to self, place and person but not time. -Reorientation and delirium precaution   Elevated LFTs: Could be due to the above.  She is also on a statin and Tylenol  at home.  Acute hepatitis and Tylenol  level within normal. -Holding statin  Essential hypertension-continue home Toprol -XL  Mood disorder-continue home Effexor   GERD-continue home PPI  Body mass index is 21.63 kg/m.          DVT prophylaxis:  On full dose anticoagulation  Code Status: Full code Family Communication: Updated patient's son at bedside Level of care: Telemetry Medical Status is: Inpatient Remains inpatient appropriate because: Extensive lower extremity DVT   Final disposition: To be determined after surgery   35 minutes with more than 50% spent in reviewing records, counseling patient/family and coordinating care.   Sch Meds:  Scheduled Meds:  metoprolol  succinate  50 mg Oral Daily   pantoprazole   40 mg Oral Daily   venlafaxine  XR  75 mg Oral Q breakfast   Continuous Infusions:  sodium chloride  50 mL/hr at 04/02/24 1930   heparin  1,200 Units/hr (04/02/24 1930)   PRN Meds:.acetaminophen  **OR** acetaminophen , bisacodyl , melatonin, ondansetron  **OR** ondansetron  (ZOFRAN ) IV,  senna-docusate  Antimicrobials: Anti-infectives (From admission, onward)    None        I have personally reviewed the following labs and images: CBC: Recent Labs  Lab 04/01/24 1330 04/02/24 0357 04/03/24 0520  WBC 9.5 10.8* 7.6  HGB 13.6 12.5 11.3*  HCT 43.2 37.3 33.1*  MCV 97.5 91.0 89.7  PLT 211 231 252   BMP &GFR Recent Labs  Lab 04/01/24 1453 04/02/24 0357 04/03/24 0520  NA 135 132* 130*  K 4.7 4.7 4.2  CL 104 99 97*  CO2  20* 20* 21*  GLUCOSE 112* 111* 102*  BUN 26* 25* 22  CREATININE 0.72 0.83 0.78  CALCIUM  8.0* 9.3 8.5*  MG  --  2.3  --    Estimated Creatinine Clearance: 48.8 mL/min (by C-G formula based on SCr of 0.78 mg/dL). Liver & Pancreas: Recent Labs  Lab 04/01/24 1453 04/02/24 0357 04/03/24 0520  AST 68* 47* 97*  ALT 110* 101* 127*  ALKPHOS 169* 186* 243*  BILITOT 1.6* 1.2 1.0  PROT 6.5 7.5 6.8  ALBUMIN 2.3* 2.7* 2.3*   No results for input(s): "LIPASE", "AMYLASE" in the last 168 hours. Recent Labs  Lab 04/01/24 1330  AMMONIA 24   Diabetic: No results for input(s): "HGBA1C" in the last 72 hours. No results for input(s): "GLUCAP" in the last 168 hours. Cardiac Enzymes: Recent Labs  Lab 04/02/24 0357  CKTOTAL 40   No results for input(s): "PROBNP" in the last 8760 hours. Coagulation Profile: No results for input(s): "INR", "PROTIME" in the last 168 hours. Thyroid  Function Tests: Recent Labs    04/02/24 0357  TSH 3.182   Lipid Profile: No results for input(s): "CHOL", "HDL", "LDLCALC", "TRIG", "CHOLHDL", "LDLDIRECT" in the last 72 hours. Anemia Panel: No results for input(s): "VITAMINB12", "FOLATE", "FERRITIN", "TIBC", "IRON", "RETICCTPCT" in the last 72 hours. Urine analysis:    Component Value Date/Time   COLORURINE AMBER (A) 04/01/2024 1715   APPEARANCEUR HAZY (A) 04/01/2024 1715   LABSPEC 1.030 04/01/2024 1715   PHURINE 5.0 04/01/2024 1715   GLUCOSEU NEGATIVE 04/01/2024 1715   GLUCOSEU NEGATIVE 08/15/2016 0929   HGBUR NEGATIVE 04/01/2024 1715   BILIRUBINUR NEGATIVE 04/01/2024 1715   BILIRUBINUR negative 09/08/2016 1412   KETONESUR NEGATIVE 04/01/2024 1715   PROTEINUR 30 (A) 04/01/2024 1715   UROBILINOGEN 0.2 09/08/2016 1412   UROBILINOGEN 0.2 08/15/2016 0929   NITRITE NEGATIVE 04/01/2024 1715   LEUKOCYTESUR MODERATE (A) 04/01/2024 1715   Sepsis Labs: Invalid input(s): "PROCALCITONIN", "LACTICIDVEN"  Microbiology: Recent Results (from the past 240 hours)   Blood culture (routine x 2)     Status: None (Preliminary result)   Collection Time: 04/01/24  1:15 PM   Specimen: BLOOD  Result Value Ref Range Status   Specimen Description BLOOD LEFT ANTECUBITAL  Final   Special Requests   Final    BOTTLES DRAWN AEROBIC ONLY Blood Culture results may not be optimal due to an inadequate volume of blood received in culture bottles   Culture   Final    NO GROWTH 2 DAYS Performed at [facility], [address], [place], [place] [address]    Report Status PENDING  Incomplete  Blood culture (routine x 2)     Status: None (Preliminary result)   Collection Time: 04/01/24  1:20 PM   Specimen: BLOOD RIGHT FOREARM  Result Value Ref Range Status   Specimen Description BLOOD RIGHT FOREARM  Final   Special Requests   Final    BOTTLES DRAWN AEROBIC ONLY  Blood Culture results may not be optimal due to an inadequate volume of blood received in culture bottles   Culture   Final    NO GROWTH 2 DAYS Performed at [facility], [address] [place], [place] [address]    Report Status PENDING  Incomplete  Resp panel by RT-PCR (RSV, Flu A&B, Covid) Anterior Nasal Swab     Status: None   Collection Time: 04/01/24  1:24 PM   Specimen: Anterior Nasal Swab  Result Value Ref Range Status   SARS Coronavirus 2 by RT PCR NEGATIVE NEGATIVE Final   Influenza A by PCR NEGATIVE NEGATIVE Final   Influenza B by PCR NEGATIVE NEGATIVE Final    Comment: (NOTE) The Xpert Xpress SARS-CoV-2/FLU/RSV plus assay is intended as an aid in the diagnosis of influenza from Nasopharyngeal swab specimens and should not be used as a sole basis for treatment. Nasal washings and aspirates are unacceptable for Xpert Xpress SARS-CoV-2/FLU/RSV testing.  Fact Sheet for Patients: BloggerCourse.com  Fact Sheet for Healthcare Providers: SeriousBroker.it  This test is not yet approved or cleared by the [place]  FDA and has been  authorized for detection and/or diagnosis of SARS-CoV-2 by FDA under an Emergency Use Authorization (EUA). This EUA will remain in effect (meaning this test can be used) for the duration of the COVID-19 declaration under Section 564(b)(1) of the Act, 21 U.S.C. section 360bbb-3(b)(1), unless the authorization is terminated or revoked.     Resp Syncytial Virus by PCR NEGATIVE NEGATIVE Final    Comment: (NOTE) Fact Sheet for Patients: BloggerCourse.com  Fact Sheet for Healthcare Providers: SeriousBroker.it  This test is not yet approved or cleared by the [place]  FDA and has been authorized for detection and/or diagnosis of SARS-CoV-2 by FDA under an Emergency Use Authorization (EUA). This EUA will remain in effect (meaning this test can be used) for the duration of the COVID-19 declaration under Section 564(b)(1) of the Act, 21 U.S.C. section 360bbb-3(b)(1), unless the authorization is terminated or revoked.  Performed at [facility], [address] [place], [place] [address]     Radiology Studies: No results found.    Milana Salay T. Yulonda [facility]  If 7PM-7AM, please contact night-coverage [email or web address] 04/03/2024, 12:29 PM

## 2024-04-04 ENCOUNTER — Encounter (HOSPITAL_COMMUNITY)
Admission: EM | Disposition: A | Discharge status: Home Health | Payer: Self-pay | Source: Home / Self Care | Attending: Student

## 2024-04-04 ENCOUNTER — Other Ambulatory Visit (HOSPITAL_COMMUNITY): Payer: Self-pay

## 2024-04-04 ENCOUNTER — Telehealth (HOSPITAL_COMMUNITY): Payer: Self-pay | Admitting: Pharmacy Technician

## 2024-04-04 ENCOUNTER — Inpatient Hospital Stay (HOSPITAL_COMMUNITY)

## 2024-04-04 ENCOUNTER — Encounter (HOSPITAL_COMMUNITY): Payer: Self-pay | Admitting: Vascular Surgery

## 2024-04-04 DIAGNOSIS — I10 Essential (primary) hypertension: Secondary | ICD-10-CM | POA: Diagnosis not present

## 2024-04-04 DIAGNOSIS — K5900 Constipation, unspecified: Secondary | ICD-10-CM | POA: Diagnosis not present

## 2024-04-04 DIAGNOSIS — I82422 Acute embolism and thrombosis of left iliac vein: Secondary | ICD-10-CM

## 2024-04-04 DIAGNOSIS — I82412 Acute embolism and thrombosis of left femoral vein: Secondary | ICD-10-CM | POA: Diagnosis not present

## 2024-04-04 DIAGNOSIS — K219 Gastro-esophageal reflux disease without esophagitis: Secondary | ICD-10-CM

## 2024-04-04 DIAGNOSIS — E440 Moderate protein-calorie malnutrition: Secondary | ICD-10-CM | POA: Insufficient documentation

## 2024-04-04 DIAGNOSIS — R748 Abnormal levels of other serum enzymes: Secondary | ICD-10-CM

## 2024-04-04 DIAGNOSIS — R4189 Other symptoms and signs involving cognitive functions and awareness: Secondary | ICD-10-CM

## 2024-04-04 HISTORY — PX: PERIPHERAL VASCULAR THROMBECTOMY: CATH118306

## 2024-04-04 HISTORY — PX: LOWER EXTREMITY VENOGRAPHY: CATH118253

## 2024-04-04 LAB — COMPREHENSIVE METABOLIC PANEL WITH GFR
ALT: 144 U/L — ABNORMAL HIGH (ref 0–44)
AST: 118 U/L — ABNORMAL HIGH (ref 15–41)
Albumin: 2.2 g/dL — ABNORMAL LOW (ref 3.5–5.0)
Alkaline Phosphatase: 288 U/L — ABNORMAL HIGH (ref 38–126)
Anion gap: 11 (ref 5–15)
BUN: 18 mg/dL (ref 8–23)
CO2: 18 mmol/L — ABNORMAL LOW (ref 22–32)
Calcium: 8.6 mg/dL — ABNORMAL LOW (ref 8.9–10.3)
Chloride: 99 mmol/L (ref 98–111)
Creatinine, Ser: 0.71 mg/dL (ref 0.44–1.00)
GFR, Estimated: >60 mL/min (ref >60)
Glucose, Bld: 104 mg/dL — ABNORMAL HIGH (ref 70–99)
Potassium: 4.3 mmol/L (ref 3.5–5.1)
Sodium: 128 mmol/L — ABNORMAL LOW (ref 135–145)
Total Bilirubin: 1.2 mg/dL (ref 0.0–1.2)
Total Protein: 6.6 g/dL (ref 6.5–8.1)

## 2024-04-04 LAB — OSMOLALITY, URINE: Osmolality, Ur: 358 mosm/kg (ref 300–900)

## 2024-04-04 LAB — CBC
HCT: 33 % — ABNORMAL LOW (ref 36.0–46.0)
Hemoglobin: 10.9 g/dL — ABNORMAL LOW (ref 12.0–15.0)
MCH: 29.7 pg (ref 26.0–34.0)
MCHC: 33 g/dL (ref 30.0–36.0)
MCV: 89.9 fL (ref 80.0–100.0)
Platelets: 298 10*3/uL (ref 150–400)
RBC: 3.67 MIL/uL — ABNORMAL LOW (ref 3.87–5.11)
RDW: 12.1 % (ref 11.5–15.5)
WBC: 7.8 10*3/uL (ref 4.0–10.5)
nRBC: 0 % (ref 0.0–0.2)

## 2024-04-04 LAB — HEPARIN LEVEL (UNFRACTIONATED): Heparin Unfractionated: 0.34 [IU]/mL (ref 0.30–0.70)

## 2024-04-04 LAB — CORTISOL: Cortisol, Plasma: 28 ug/dL

## 2024-04-04 LAB — TSH: TSH: 5.123 u[IU]/mL — ABNORMAL HIGH (ref 0.350–4.500)

## 2024-04-04 LAB — MAGNESIUM: Magnesium: 2.1 mg/dL (ref 1.7–2.4)

## 2024-04-04 LAB — SODIUM, URINE, RANDOM: Sodium, Ur: 13 mmol/L

## 2024-04-04 SURGERY — PERIPHERAL VASCULAR THROMBECTOMY
Anesthesia: LOCAL | Laterality: Left

## 2024-04-04 MED ORDER — SODIUM BICARBONATE 650 MG PO TABS
650.0000 mg | ORAL_TABLET | Freq: Two times a day (BID) | ORAL | Status: DC
  Administered 2024-04-04 – 2024-04-07 (×6): 650 mg via ORAL
  Filled 2024-04-04 (×6): qty 1

## 2024-04-04 MED ORDER — HEPARIN SODIUM (PORCINE) 1000 UNIT/ML IJ SOLN
INTRAMUSCULAR | Status: DC | PRN
  Administered 2024-04-04: 5000 [IU] via INTRAVENOUS

## 2024-04-04 MED ORDER — FENTANYL CITRATE (PF) 100 MCG/2ML IJ SOLN
INTRAMUSCULAR | Status: AC
  Filled 2024-04-04: qty 2

## 2024-04-04 MED ORDER — HEPARIN (PORCINE) 25000 UT/250ML-% IV SOLN
1300.0000 [IU]/h | INTRAVENOUS | Status: DC
  Administered 2024-04-04 – 2024-04-05 (×2): 1200 [IU]/h via INTRAVENOUS
  Administered 2024-04-06: 1300 [IU]/h via INTRAVENOUS
  Filled 2024-04-04 (×2): qty 250

## 2024-04-04 MED ORDER — MIDAZOLAM HCL 2 MG/2ML IJ SOLN
INTRAMUSCULAR | Status: AC
  Filled 2024-04-04: qty 2

## 2024-04-04 MED ORDER — LIDOCAINE HCL (PF) 1 % IJ SOLN
INTRAMUSCULAR | Status: AC
Start: 2024-04-04 — End: ?
  Filled 2024-04-04: qty 30

## 2024-04-04 MED ORDER — HEPARIN SODIUM (PORCINE) 1000 UNIT/ML IJ SOLN
INTRAMUSCULAR | Status: AC
  Filled 2024-04-04: qty 10

## 2024-04-04 MED ORDER — FENTANYL CITRATE (PF) 100 MCG/2ML IJ SOLN
INTRAMUSCULAR | Status: DC | PRN
  Administered 2024-04-04 (×2): 50 ug via INTRAVENOUS

## 2024-04-04 MED ORDER — MIDAZOLAM HCL 2 MG/2ML IJ SOLN
INTRAMUSCULAR | Status: DC | PRN
  Administered 2024-04-04 (×2): 1 mg via INTRAVENOUS

## 2024-04-04 MED ORDER — IODIXANOL 320 MG/ML IV SOLN
INTRAVENOUS | Status: DC | PRN
  Administered 2024-04-04: 15 mL via INTRAVENOUS

## 2024-04-04 MED ORDER — HEPARIN (PORCINE) IN NACL 1000-0.9 UT/500ML-% IV SOLN
INTRAVENOUS | Status: DC | PRN
Start: 2024-04-04 — End: 2024-04-04
  Administered 2024-04-04: 500 mL

## 2024-04-04 MED ORDER — LIDOCAINE HCL (PF) 1 % IJ SOLN
INTRAMUSCULAR | Status: DC | PRN
  Administered 2024-04-04: 10 mL via SUBCUTANEOUS

## 2024-04-04 SURGICAL SUPPLY — 9 items
CATH CLOT TRIEVER BOLD (CATHETERS) IMPLANT
CATH VISIONS PV .035 IVUS (CATHETERS) IMPLANT
COVER DOME SNAP 22 D (MISCELLANEOUS) IMPLANT
GLIDEWIRE ADV .035X260CM (WIRE) IMPLANT
KIT MICROPUNCTURE NIT STIFF (SHEATH) IMPLANT
SHEATH CLOT RETRIEVER (SHEATH) IMPLANT
SHEATH PINNACLE 8F 10CM (SHEATH) IMPLANT
TRAY PV CATH (CUSTOM PROCEDURE TRAY) IMPLANT
WIRE BENTSON .035X145CM (WIRE) IMPLANT

## 2024-04-04 NOTE — Op Note (Signed)
    Patient name: [name] MRN: [id number] DOB: 12-08-1941 Sex: female  04/01/2024 - 04/04/2024 Pre-operative Diagnosis: Left iliofemoral DVT Post-operative diagnosis:  Same Surgeon:  Philipp Brawn, MD Procedure Performed:  Ultrasound-guided access of left popliteal vein Intravascular ultrasound of left popliteal, femoral, common femoral veins Intravascular ultrasound of the left external iliac, common iliac vein and IVC Mechanical thrombectomy of left common iliac, external iliac, common femoral, femoral and popliteal veins Left leg venogram 43 minutes moderate sedation with fentanyl  and Versed     Indications: [name] is a 83 year old female who presented to the emergency department over the weekend with 1 week history of encephalopathy, poor p.o. intake and lethargy.  She was also noted to have a significantly swollen left lower extremity which was reported started over the last few days.  She was found to have an extensive left lower extremity DVT with extension into the left common iliac vein.  Mechanical thrombectomy was offered, risk and benefits were reviewed, the d husband expressed understanding and elected to proceed.  Findings:  Thrombosis of the left common iliac, external iliac, common femoral, femoral and popliteal veins  Acute thrombus retrieved from the left common iliac, external iliac, common femoral, femoral and popliteal veins. No compression of the left common iliac vein.  No thrombus noted in the IVC.  Brisk flow through the left leg and into the IVC on completion   Procedure:  The patient was identified in the holding area and taken to the cath lab  The patient was then placed supine on the table and prepped and draped in the usual sterile fashion.  A time out was called.  Ultrasound was used to evaluate the left popliteal vein.  It was occluded and noncompressible.  This was accessed under ultrasound guidance and the micropuncture sheath was placed.   This was then upsized to an 8 [place] sheath over a glide advantage wire.  Intravascular ultrasound was used which demonstrated the above findings.  The patient was then systemically heparinized with 5000 units heparin  and the 8 French sheath was exchanged for the 13 [place] Inari sheath.  The catheter was advanced into the IVC, basket released and gently exchanged back into the sheath.  A total of 4 passes were made in 90 degree obliques.  Each pass retrieved acute thrombus.  After the fourth pass IVUS was readvanced which demonstrated a small femoral vein but a widely patent left common femoral, external iliac and common iliac vein.  A completion venogram was then obtained which demonstrated brisk flow and wide patency of the treated veins.  The wire and sheath were removed and a figure-of-eight suture was placed at the access site with Monocryl suture.  Contrast: 15cc  Sedation: 43 minutes  Impression: Successful thrombectomy of the left lower extremity with retrieval of acute thrombus and brisk flow from the left popliteal into the IVC.   Philipp Brawn MD [facility] Office: [phone]

## 2024-04-04 NOTE — Progress Notes (Signed)
 Initial Nutrition Assessment  DOCUMENTATION CODES:  Non-severe (moderate) malnutrition in context of chronic illness  INTERVENTION:  Once diet resumes, recommend: Regular diet  Magic cup TID with meals, each supplement provides 290 kcal and 9 grams of protein  NUTRITION DIAGNOSIS:  Moderate Malnutrition related to chronic illness as evidenced by moderate fat depletion, moderate muscle depletion.  GOAL:  Patient will meet greater than or equal to 90% of their needs  MONITOR:  Diet advancement, Labs, Weight trends  REASON FOR ASSESSMENT:  Consult Assessment of nutrition requirement/status, Poor PO  ASSESSMENT:  Pt admitted with c/o L leg swelling d/t extensive iliac, femoral, popliteal and tibial vein DVT of L leg. PMH significant for HTN, GERD and likely undiagnosed dementia.  Plans for mechanical thrombectomy today.   Pt resting at time of visit. Her husband present at bedside. He states that over the last week she has been consuming smaller portion sizes than normal. He reports that they typically eat 2 balanced meals daily (protein with 2 vegetables; a pasta dish; or salad) and then some snacks in between. He denies that she has encountered any chewing/swallowing difficulties.  Independent with ADLs at baseline. Stopped walking 2 days ago and stopped talking 5 days ago.   No weight history on file to review within the last year. Likely admit weight is a stated weight. Pt's husband reports that she may have lost a few pounds but does not feel that she's had any significant recent weight loss.   Medications reviewed  Labs:  Sodium 130 Alkaline phosphatase 243 AST 97 ALT 127  NUTRITION - FOCUSED PHYSICAL EXAM: Despite husbands recall of PO intake and weight being at baseline, mild-moderate muscle and fat depletions are suggestive of a period of undernutrition likely in the setting of cognitive impairment/undiagnosed dementia.  Flowsheet Row Most Recent Value  Orbital  Region Mild depletion  Upper Arm Region Moderate depletion  Thoracic and Lumbar Region Unable to assess  Buccal Region Moderate depletion  Temple Region Mild depletion  Clavicle Bone Region Mild depletion  Clavicle and Acromion Bone Region Mild depletion  Scapular Bone Region Mild depletion  Dorsal Hand Moderate depletion  Patellar Region Moderate depletion  Anterior Thigh Region Moderate depletion  Posterior Calf Region Mild depletion  Edema (RD Assessment) Moderate  [LLE pitting edema per RN documentation]  Hair Reviewed  Eyes Reviewed  Mouth --  [pt declined]  Skin Reviewed  Nails Reviewed   Diet Order:   Diet Order             Diet NPO time specified  Diet effective midnight                   EDUCATION NEEDS:   No education needs have been identified at this time  Skin:  Skin Assessment: Reviewed RN Assessment  Last BM:  4/18  Height:   Ht Readings from Last 1 Encounters:  04/01/24 5\' 5"  (1.651 m)    Weight:   Wt Readings from Last 1 Encounters:  04/01/24 59 kg   BMI:  Body mass index is 21.63 kg/m.  Estimated Nutritional Needs:   Kcal:  1500-1700  Protein:  75-90g  Fluid:  >/=1.5L  Rocklin Chute, RDN, LDN Clinical Nutrition See AMiON for contact information.

## 2024-04-04 NOTE — Progress Notes (Signed)
 PHARMACY - ANTICOAGULATION CONSULT NOTE  Pharmacy Consult for heparin  Indication: DVT  Allergies  Allergen Reactions   Levaquin  [Levofloxacin  In D5w] Nausea Only    Dizziness and tremulousness    Patient Measurements: Height: 5\' 5"  (165.1 cm) Weight: 59 kg (130 lb) IBW/kg (Calculated) : 57 HEPARIN  DW (KG): 59  Vital Signs: Temp: 98.5 F (36.9 C) (04/21 0407) Temp Source: Axillary (04/21 0407) BP: 124/60 (04/21 0407) Pulse Rate: 87 (04/21 0407)  Labs: Recent Labs    04/01/24 1453 04/02/24 0357 04/02/24 1118 04/02/24 2104 04/03/24 0520 04/04/24 0427  HGB  --  12.5  --   --  11.3* 10.9*  HCT  --  37.3  --   --  33.1* 33.0*  PLT  --  231  --   --  252 298  HEPARINUNFRC  --  0.38   < > 0.41 0.46 0.34  CREATININE 0.72 0.83  --   --  0.78  --   CKTOTAL  --  40  --   --   --   --    < > = values in this interval not displayed.    Estimated Creatinine Clearance: 48.8 mL/min (by C-G formula based on SCr of 0.78 mg/dL).   Medical History: Past Medical History:  Diagnosis Date   Actinic keratosis 12/03/2012   Allergy     Anxiety    Effexor  helps--psychiatrist Dr Deborra Falter   Arthritis    osteoarthritis, hands   Atypical chest pain    EF 86%, breast attenuation, no significant ischemia   Cataract    Cervical radiculopathy 11/18/2013   Chronic headache    takes Metoprolol  for this   Diverticulosis    Erosive esophagitis    GERD (gastroesophageal reflux disease) 11/2009   EGD: Dr. 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 No AC PTA. Pharmacy to dose IV heparin .  Heparin  level therapeutic this AM  Goal of Therapy:  Heparin  level 0.3-0.7 units/ml Monitor platelets by anticoagulation protocol: Yes   Plan:  -Continue heparin  at 1200 units/hr -Monitor heparin  level, CBC, and S/Sx bleeding daily  Follow up post thrombectomy   Thank you. Lennice Quivers, PharmD 04/04/2024 8:27 AM

## 2024-04-04 NOTE — Progress Notes (Signed)
 PROGRESS NOTE  [name] WUX:[id number] DOB: [birth year]   PCP: Alejandro Hurt, FNP  Patient is from: Home.  Lives with husband.  Dependent for ADLs.  DOA: 04/01/2024 LOS: 3  Chief complaints No chief complaint on file.    Brief Narrative / Interim history: 82 year old F with PMH of cognitive impairment, HTN and GERD presenting with progressive left leg swelling and pain, and admitted with extensive left lower extremity DVT.  Patient was started on IV heparin .  Vascular surgery consulted.  Plan for mechanical thrombectomy on 4/21.  CT angio chest negative for PE.  Subjective: Seen and examined earlier this morning.  No major events overnight of this morning.  No complaints but not a great historian.  Patient's husband at bedside.  Objective: Vitals:   04/04/24 0027 04/04/24 0407 04/04/24 0908 04/04/24 1000  BP: 118/74 124/60 130/76   Pulse: (!) 101 87 85 82  Resp: 20 18 17 18   Temp: 98.8 F (37.1 C) 98.5 F (36.9 C) 97.8 F (36.6 C)   TempSrc: Axillary Axillary Oral   SpO2: 99%  98%   Weight:      Height:        Examination:  GENERAL: No apparent distress.  Nontoxic. HEENT: MMM.  Vision and hearing grossly intact.  NECK: Supple.  No apparent JVD.  RESP:  No IWOB.  Fair aeration bilaterally. CVS:  RRR. Heart sounds normal.  ABD/GI/GU: BS+. Abd soft, NTND.  MSK/EXT:  Moves extremities.  Significantly swollen LLE.  DP pulses 2+ bilaterally. SKIN: no apparent skin lesion or wound NEURO: Awake and alert.  Oriented to self, place and person.  Follows commands.  No apparent focal neuro deficit. PSYCH: Calm. Normal affect.   Consultants:  Vascular surgery  Procedures: None  Microbiology summarized: None  Assessment and plan: Extensive left-sided DVT involving the common iliac vein, external and internal iliac veins in the abdomen extending down into the left leg from femoral vein down to the posterior tibial veins.  Likely due to immobility.  CT angio chest  negative for PE. -Continue IV heparin  -Plan for mechanical thrombectomy by vascular surgery on 4/21   Dementia without behavioral disturbance: Not formally diagnosed.  Awake and alert.  Oriented to self, place and person but not time. -Reorientation and delirium precaution   Elevated LFTs: Could be due to the above.  She is also on a statin and Tylenol  at home.  Acute hepatitis and Tylenol  level within normal.  Acute hepatitis panel negative. -Continue holding statin -Check RUQ US . 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 - Hold Effexor  due to worsening hyponatremia  GERD-continue home PPI  Body mass index is 21.63 kg/m.          DVT prophylaxis:  On full dose anticoagulation  Code Status: Full code Family Communication: Updated patient's husband at bedside. Level of care: Telemetry Medical Status is: Inpatient Remains inpatient appropriate because: Extensive lower extremity DVT   Final disposition: To be determined after surgery   55 minutes with more than 50% spent in reviewing records, counseling patient/family and coordinating care.   Sch Meds:  Scheduled Meds:  metoprolol  succinate  50 mg Oral Daily   pantoprazole   40 mg Oral Daily   venlafaxine  XR  75 mg Oral Q breakfast   Continuous Infusions:  sodium chloride  50 mL/hr at 04/02/24 1930   heparin   1,200 Units/hr (04/04/24 0930)   PRN Meds:.acetaminophen  **OR** acetaminophen , bisacodyl , melatonin, ondansetron  **OR** ondansetron  (ZOFRAN ) IV, senna-docusate  Antimicrobials: Anti-infectives (From admission, onward)    None        I have personally reviewed the following labs and images: CBC: Recent Labs  Lab 04/01/24 1330 04/02/24 0357 04/03/24 0520 04/04/24 0427  WBC 9.5 10.8* 7.6 7.8  HGB 13.6 12.5 11.3* 10.9*  HCT 43.2  37.3 33.1* 33.0*  MCV 97.5 91.0 89.7 89.9  PLT 211 231 252 298   BMP &GFR Recent Labs  Lab 04/01/24 1453 04/02/24 0357 04/03/24 0520 04/04/24 0830  NA 135 132* 130* 128*  K 4.7 4.7 4.2 4.3  CL 104 99 97* 99  CO2 20* 20* 21* 18*  GLUCOSE 112* 111* 102* 104*  BUN 26* 25* 22 18  CREATININE 0.72 0.83 0.78 0.71  CALCIUM  8.0* 9.3 8.5* 8.6*  MG  --  2.3  --  2.1   Estimated Creatinine Clearance: 48.8 mL/min (by C-G formula based on SCr of 0.71 mg/dL). Liver & Pancreas: Recent Labs  Lab 04/01/24 1453 04/02/24 0357 04/03/24 0520 04/04/24 0830  AST 68* 47* 97* 118*  ALT 110* 101* 127* 144*  ALKPHOS 169* 186* 243* 288*  BILITOT 1.6* 1.2 1.0 1.2  PROT 6.5 7.5 6.8 6.6  ALBUMIN 2.3* 2.7* 2.3* 2.2*   No results for input(s): "LIPASE", "AMYLASE" in the last 168 hours. Recent Labs  Lab 04/01/24 1330  AMMONIA 24   Diabetic: No results for input(s): "HGBA1C" in the last 72 hours. No results for input(s): "GLUCAP" in the last 168 hours. Cardiac Enzymes: Recent Labs  Lab 04/02/24 0357  CKTOTAL 40   No results for input(s): "PROBNP" in the last 8760 hours. Coagulation Profile: No results for input(s): "INR", "PROTIME" in the last 168 hours. Thyroid  Function Tests: Recent Labs    04/02/24 0357  TSH 3.182   Lipid Profile: No results for input(s): "CHOL", "HDL", "LDLCALC", "TRIG", "CHOLHDL", "LDLDIRECT" in the last 72 hours. Anemia Panel: No results for input(s): "VITAMINB12", "FOLATE", "FERRITIN", "TIBC", "IRON", "RETICCTPCT" in the last 72 hours. Urine analysis:    Component Value Date/Time   COLORURINE AMBER (A) 04/01/2024 1715   APPEARANCEUR HAZY (A) 04/01/2024 1715   LABSPEC 1.030 04/01/2024 1715   PHURINE 5.0 04/01/2024 1715   GLUCOSEU NEGATIVE 04/01/2024 1715   GLUCOSEU NEGATIVE 08/15/2016 0929   HGBUR NEGATIVE 04/01/2024 1715   BILIRUBINUR NEGATIVE 04/01/2024 1715   BILIRUBINUR negative 09/08/2016 1412   KETONESUR NEGATIVE 04/01/2024 1715   PROTEINUR 30 (A)  04/01/2024 1715   UROBILINOGEN 0.2 09/08/2016 1412   UROBILINOGEN 0.2 08/15/2016 0929   NITRITE NEGATIVE 04/01/2024 1715   LEUKOCYTESUR MODERATE (A) 04/01/2024 1715   Sepsis Labs: Invalid input(s): "PROCALCITONIN", "LACTICIDVEN"  Microbiology: Recent Results (from the past 240 hours)  Blood culture (routine x 2)     Status: None (Preliminary result)   Collection Time: 04/01/24  1:15 PM   Specimen: BLOOD  Result Value Ref Range Status   Specimen Description BLOOD LEFT ANTECUBITAL  Final   Special Requests   Final    BOTTLES DRAWN AEROBIC ONLY Blood Culture results may not be optimal due to an inadequate volume of blood received in culture bottles   Culture   Final    NO GROWTH 3 DAYS Performed at [facility], [address], [place], [place] [address]    Report Status PENDING  Incomplete  Blood culture (routine x 2)     Status: None (Preliminary result)  Collection Time: 04/01/24  1:20 PM   Specimen: BLOOD RIGHT FOREARM  Result Value Ref Range Status   Specimen Description BLOOD RIGHT FOREARM  Final   Special Requests   Final    BOTTLES DRAWN AEROBIC ONLY Blood Culture results may not be optimal due to an inadequate volume of blood received in culture bottles   Culture   Final    NO GROWTH 3 DAYS Performed at [facility], [address], [place], [place] [address]    Report Status PENDING  Incomplete  Resp panel by RT-PCR (RSV, Flu A&B, Covid) Anterior Nasal Swab     Status: None   Collection Time: 04/01/24  1:24 PM   Specimen: Anterior Nasal Swab  Result Value Ref Range Status   SARS Coronavirus 2 by RT PCR NEGATIVE NEGATIVE Final   Influenza A by PCR NEGATIVE NEGATIVE Final   Influenza B by PCR NEGATIVE NEGATIVE Final    Comment: (NOTE) The Xpert Xpress SARS-CoV-2/FLU/RSV plus assay is intended as an aid in the diagnosis of influenza from Nasopharyngeal swab specimens and should not be used as a sole basis for treatment. Nasal washings and aspirates are  unacceptable for Xpert Xpress SARS-CoV-2/FLU/RSV testing.  Fact Sheet for Patients: BloggerCourse.com  Fact Sheet for Healthcare Providers: SeriousBroker.it  This test is not yet approved or cleared by the [place]  FDA and has been authorized for detection and/or diagnosis of SARS-CoV-2 by FDA under an Emergency Use Authorization (EUA). This EUA will remain in effect (meaning this test can be used) for the duration of the COVID-19 declaration under Section 564(b)(1) of the Act, 21 U.S.C. section 360bbb-3(b)(1), unless the authorization is terminated or revoked.     Resp Syncytial Virus by PCR NEGATIVE NEGATIVE Final    Comment: (NOTE) Fact Sheet for Patients: BloggerCourse.com  Fact Sheet for Healthcare Providers: SeriousBroker.it  This test is not yet approved or cleared by the [place]  FDA and has been authorized for detection and/or diagnosis of SARS-CoV-2 by FDA under an Emergency Use Authorization (EUA). This EUA will remain in effect (meaning this test can be used) for the duration of the COVID-19 declaration under Section 564(b)(1) of the Act, 21 U.S.C. section 360bbb-3(b)(1), unless the authorization is terminated or revoked.  Performed at [facility], [address], [place], [place] [address]     Radiology Studies: No results found.    Jaquita Bessire T. Adenike Shidler [facility]  If 7PM-7AM, please contact night-coverage [email or web address] 04/04/2024, 12:13 PM

## 2024-04-04 NOTE — Progress Notes (Addendum)
  Progress Note    04/04/2024 6:50 AM Hospital Day 3  Subjective:  sleeping-I did not wake her. Husband at bedside.  Questions answered.  Tm 98.9  Vitals:   04/04/24 0027 04/04/24 0407  BP: 118/74 124/60  Pulse: (!) 101 87  Resp: 20 18  Temp: 98.8 F (37.1 C) 98.5 F (36.9 C)  SpO2: 99%     Physical Exam: General:  no distress; sleeping Lungs:  non labored   CBC    Component Value Date/Time   WBC 7.8 04/04/2024 0427   RBC 3.67 (L) 04/04/2024 0427   HGB 10.9 (L) 04/04/2024 0427   HCT 33.0 (L) 04/04/2024 0427   PLT 298 04/04/2024 0427   MCV 89.9 04/04/2024 0427   MCH 29.7 04/04/2024 0427   MCHC 33.0 04/04/2024 0427   RDW 12.1 04/04/2024 0427   LYMPHSABS 0.9 03/09/2023 1110   MONOABS 0.6 03/09/2023 1110   EOSABS 0.1 03/09/2023 1110   BASOSABS 0.0 03/09/2023 1110    BMET    Component Value Date/Time   NA 130 (L) 04/03/2024 0520   K 4.2 04/03/2024 0520   CL 97 (L) 04/03/2024 0520   CO2 21 (L) 04/03/2024 0520   GLUCOSE 102 (H) 04/03/2024 0520   BUN 22 04/03/2024 0520   CREATININE 0.78 04/03/2024 0520   CREATININE 0.71 09/10/2012 1205   CALCIUM  8.5 (L) 04/03/2024 0520   GFRNONAA >60 04/03/2024 0520   GFRAA >60 12/22/2015 1610     Assessment/Plan:  83 y.o. female with extensive LLE DVT involving the iliac, femoral, popliteal, and tibial veins  Hospital Day 3   -plan for mechanical thrombectomy today with Dr. Susi Eric.  Continue npo -continue heparin  gtt.   Maryanna Smart, PA-C [facility] [phone] 04/04/2024 6:50 AM  VASCULAR STAFF ADDENDUM: I have independently interviewed and examined the patient. I agree with the above.  Plan LLE venous thrombectomy today. All questions answered.  Heber Little. Edgardo Goodwill, MD [facility] Phone Number: [phone] 04/04/2024 12:21 PM

## 2024-04-04 NOTE — Telephone Encounter (Signed)
 Patient Product/process development scientist completed.    The patient is insured through [employer]. Patient has Medicare and is not eligible for a copay card, but may be able to apply for patient assistance or Medicare RX Payment Plan (Patient Must reach out to their plan, if eligible for payment plan), if available.    Ran test claim for Eliquis 5 mg and the current 30 day co-pay is $152.71.  Ran test claim for Xarelto 20 mg and the current 30 day co-pay is $150.63.  This test claim was processed through [facility]- copay amounts may vary at other pharmacies due to pharmacy/plan contracts, or as the patient moves through the different stages of their insurance plan.     Roland Earl, CPHT Pharmacy Technician III Certified Patient Advocate [facility] Patient Advocate Team Direct Number: [phone]  Fax: [phone]

## 2024-04-04 NOTE — Plan of Care (Signed)
   Problem: Clinical Measurements: Goal: Respiratory complications will improve Outcome: Progressing

## 2024-04-04 NOTE — Progress Notes (Signed)
   04/04/24 2100  Vitals  Temp (!) 101.1 F (38.4 C)  Temp Source Oral  BP 135/66  MAP (mmHg) 88  BP Location Right Arm  BP Method Automatic  Pulse Rate (!) 106  ECG Heart Rate (!) 103  Level of Consciousness  Level of Consciousness Alert  MEWS COLOR  MEWS Score Color Yellow  Oxygen Therapy  SpO2 95 %  O2 Device Room Air  PCA/Epidural/Spinal Assessment  Respiratory Pattern Regular;Unlabored  Glasgow Coma Scale  Eye Opening 4  Best Verbal Response (NON-intubated) 4  Best Motor Response 6  Glasgow Coma Scale Score 14  MEWS Score  MEWS Temp 1  MEWS Systolic 0  MEWS Pulse 1  MEWS RR 0  MEWS LOC 0  MEWS Score 2  Provider Notification  Provider Name/Title Unk Garb, DO  Date Provider Notified 04/04/24  Time Provider Notified 2115  Method of Notification Page  Notification Reason Other (Comment) (Yellow MEWS)  Provider response No new orders  Date of Provider Response 04/04/24  Time of Provider Response 2118

## 2024-04-04 NOTE — Progress Notes (Signed)
 04/04/24 1000  Spiritual Encounters  Type of Visit Initial  Care provided to: Pt and family  Reason for visit Advance directives  OnCall Visit Yes    Chaplain responded to a consult request for Advance Directive education.  Chaplain provided the Advance Directive packet as well as education on Advance Directives-documents an individual completes to communicate their health care directions in advance of a time when they may need them. Chaplain informed pt the documents which may be completed here in the hospital are the Living Will and Health Care Power of [place].  Chaplain informed that the Health Care Power of [place] is a legal document in which an individual names another person, their Health Care Agent, to make health care decisions when the individual is not able to make them for themselves. The Health Care Agent's function can be temporary or permanent depending on the pt's ability to make and communicate those decisions independently. Chaplain informed pt in the absence of a Health Care Power of Attorney, the state of [place]  directs health care providers to look to the following individuals in the order listed: legal guardian; an attorney?in?fact under a general power of attorney (POA) if that POA includes the right to make health care decisions; a husband or wife; a majority of parents and adult children; a majority of adult brothers and sisters; or an individual who has an established relationship with you, who is acting in good faith and who can convey your wishes.  If none of these person are available or willing to make medical decisions on a patient's behalf, the law allows the patient's doctor to make decisions for them as long as another doctor agrees with those decisions.  Chaplain also informed the patient that the Health Care agent has no decision-making authority over any affairs other than those related to his or her medical care.  The chaplain further educated the pt  that a Living Will is a legal document that allows an individual to state his or her desire not to receive life-prolonging measures in the event that they have a condition that is incurable and will result in their death in a short period of time; they are unconscious, and doctors are confident that they will not regain consciousness; and/or they have advanced dementia or other substantial and irreversible loss of mental function. The chaplain informed pt that life-prolonging measures are medical treatments that would only serve to postpone death, including breathing machines, kidney dialysis, antibiotics, artificial nutrition and hydration (tube feeding), and similar forms of treatment and that if an individual is able to express their wishes, they may also make them known without the use of a Living Will, but in the event that an individual is not able to express their wishes themselves, a Living Will allows medical providers and the pt's family and friends ensure that they are not making decisions on the pt's behalf, but rather serving as the pt's voice to convey decisions the pt has already made.  The patient is aware that the decision to create an advance directive is theirs alone and they may chose not to complete the documents or may chose to complete one portion or both.  The patient was informed that they can revoke the documents at any time by striking through them and writing void or by completing new documents, but that it is also advisable that the individual verbally notify interested parties that their wishes have changed.  They are also aware that the document must be  signed in the presence of a notary public and two witnesses and that this can be done while the patient is still admitted to the hospital or after discharge in the community. If they decide to complete Advance Directives after being discharged from the hospital, they have been advised to notify all interested parties and to provide  those documents to their physicians and loved ones in addition to bringing them to the hospital in the event of another hospitalization.  The chaplain informed the pt that if they desire to proceed with completing Advance Directive Documentation while they are still admitted, notary services are typically available at [facility] between the hours of 1:00 and 3:30 Monday-Thursday.    When the patient is ready to have these documents completed, the patient should request that their nurse place a spiritual care consult and indicate that the patient is ready to have their advance directives notarized so that arrangements for witnesses and notary public can be made.  Please page spiritual care if the patient desires further education or has questions.      Dartha Engel Chaplain Resident [phone]

## 2024-04-05 DIAGNOSIS — I10 Essential (primary) hypertension: Secondary | ICD-10-CM | POA: Diagnosis not present

## 2024-04-05 DIAGNOSIS — I82412 Acute embolism and thrombosis of left femoral vein: Secondary | ICD-10-CM | POA: Diagnosis not present

## 2024-04-05 DIAGNOSIS — K219 Gastro-esophageal reflux disease without esophagitis: Secondary | ICD-10-CM | POA: Diagnosis not present

## 2024-04-05 DIAGNOSIS — K5900 Constipation, unspecified: Secondary | ICD-10-CM | POA: Diagnosis not present

## 2024-04-05 LAB — COMPREHENSIVE METABOLIC PANEL WITH GFR
ALT: 108 U/L — ABNORMAL HIGH (ref 0–44)
AST: 75 U/L — ABNORMAL HIGH (ref 15–41)
Albumin: 2 g/dL — ABNORMAL LOW (ref 3.5–5.0)
Alkaline Phosphatase: 273 U/L — ABNORMAL HIGH (ref 38–126)
Anion gap: 11 (ref 5–15)
BUN: 12 mg/dL (ref 8–23)
CO2: 19 mmol/L — ABNORMAL LOW (ref 22–32)
Calcium: 8.5 mg/dL — ABNORMAL LOW (ref 8.9–10.3)
Chloride: 102 mmol/L (ref 98–111)
Creatinine, Ser: 0.55 mg/dL (ref 0.44–1.00)
GFR, Estimated: >60 mL/min (ref >60)
Glucose, Bld: 116 mg/dL — ABNORMAL HIGH (ref 70–99)
Potassium: 3.6 mmol/L (ref 3.5–5.1)
Sodium: 132 mmol/L — ABNORMAL LOW (ref 135–145)
Total Bilirubin: 1.1 mg/dL (ref 0.0–1.2)
Total Protein: 6 g/dL — ABNORMAL LOW (ref 6.5–8.1)

## 2024-04-05 LAB — CBC
HCT: 31.9 % — ABNORMAL LOW (ref 36.0–46.0)
Hemoglobin: 10.8 g/dL — ABNORMAL LOW (ref 12.0–15.0)
MCH: 30.6 pg (ref 26.0–34.0)
MCHC: 33.9 g/dL (ref 30.0–36.0)
MCV: 90.4 fL (ref 80.0–100.0)
Platelets: 297 10*3/uL (ref 150–400)
RBC: 3.53 MIL/uL — ABNORMAL LOW (ref 3.87–5.11)
RDW: 12.3 % (ref 11.5–15.5)
WBC: 6.9 10*3/uL (ref 4.0–10.5)
nRBC: 0 % (ref 0.0–0.2)

## 2024-04-05 LAB — PHOSPHORUS: Phosphorus: 2.8 mg/dL (ref 2.5–4.6)

## 2024-04-05 LAB — HEPARIN LEVEL (UNFRACTIONATED): Heparin Unfractionated: 0.45 [IU]/mL (ref 0.30–0.70)

## 2024-04-05 LAB — MAGNESIUM: Magnesium: 1.9 mg/dL (ref 1.7–2.4)

## 2024-04-05 NOTE — Progress Notes (Signed)
 Pt refused her lab draw this morning, pt is on heparin  drip. Pt and husband educated, continued to refuse. On call provider notified and aware.

## 2024-04-05 NOTE — TOC Initial Note (Signed)
 Transition of Care [facility]) - Initial/Assessment Note    Patient Details  Name: [name] MRN: [id number] Date of Birth: 1941-04-07  [facility] CM/SW Contact:    Omie Bickers, RN Phone Number: 04/05/2024, 2:03 PM  Clinical Narrative:                  Eldora Greet w patient (dementia) and spouse at bedside.  Spouse states that patient has all DME at home "except a WC." He does not anticipate that she will need a WC at DC.  Patient is not active w HH services. They would like a PT consult, I have requested this from the attending.   Expected Discharge Plan: Home/Self Care Barriers to Discharge: Continued Medical Work up   Patient Goals and CMS Choice Patient states their goals for this hospitalization and ongoing recovery are:: to go home          Expected Discharge Plan and Services   Discharge Planning Services: CM Consult   Living arrangements for the past 2 months: Single Family Home                                      Prior Living Arrangements/Services Living arrangements for the past 2 months: Single Family Home Lives with:: Spouse              Current home services: DME    Activities of Daily Living   ADL Screening (condition at time of admission) Independently performs ADLs?: Yes (appropriate for developmental age) Is the patient deaf or have difficulty hearing?: No Does the patient have difficulty seeing, even when wearing glasses/contacts?: No Does the patient have difficulty concentrating, remembering, or making decisions?: Yes  Permission Sought/Granted                  Emotional Assessment              Admission diagnosis:  DVT (deep venous thrombosis) (HCC) [I82.409] Altered mental status, unspecified altered mental status type [R41.82] Patient Active Problem List   Diagnosis Date Noted   Malnutrition of moderate degree 04/04/2024   Cognitive impairment 04/03/2024   DVT (deep venous thrombosis) (HCC) 04/01/2024    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 [place] [address] Phone: [phone] Fax: [phone]  [facility] [place] [facility] [address] [place] [address] Phone: [phone] Fax: [phone]  [facility] [address] [place] [address] Phone: [phone] Fax: [phone]     Social Drivers of Health (SDOH) Social History: SDOH Screenings   Food Insecurity: Patient Declined (04/01/2024)  Housing: Patient Declined (04/01/2024)  Transportation Needs: Patient Declined (04/01/2024)  Utilities: Patient Declined (04/01/2024)  Social Connections: Patient Declined (04/01/2024)  Tobacco Use: Medium Risk (04/01/2024)   SDOH Interventions:     Readmission Risk Interventions     No data to display

## 2024-04-05 NOTE — Plan of Care (Signed)
   Problem: Activity: Goal: Risk for activity intolerance will decrease Outcome: Progressing

## 2024-04-05 NOTE — Progress Notes (Signed)
 PROGRESS NOTE  [name] HYQ:[id number] DOB: 1941/04/07   PCP: Alejandro Hurt, FNP  Patient is from: Home.  Lives with husband.  Dependent for ADLs.  DOA: 04/01/2024 LOS: 4  Chief complaints No chief complaint on file.    Brief Narrative / Interim history: 83 year old F with PMH of cognitive impairment, HTN and GERD presenting with progressive left leg swelling and pain, and admitted with extensive left lower extremity DVT.  Patient was started on IV heparin .  Vascular surgery consulted.  Plan for mechanical thrombectomy on 4/21.  CT angio chest negative for PE.  Patient underwent mechanical thrombectomy by Dr. Susi Eric on 4/21.  No postop complication.  Subjective: Seen and examined earlier this morning.  No major events overnight of this morning.  No complaints.  Objective: Vitals:   04/05/24 0738 04/05/24 0745 04/05/24 0845 04/05/24 1138  BP: (!) 157/80 (!) 157/80 (!) 151/75 132/65  Pulse: 87 90 92 87  Resp: 18   18  Temp: 98.8 F (37.1 C)   97.8 F (36.6 C)  TempSrc: Oral   Oral  SpO2: 99%   97%  Weight:      Height:        Examination:  GENERAL: No apparent distress.  Nontoxic. HEENT: MMM.  Vision and hearing grossly intact.  NECK: Supple.  No apparent JVD.  RESP:  No IWOB.  Fair aeration bilaterally. CVS:  RRR. Heart sounds normal.  ABD/GI/GU: BS+. Abd soft, NTND.  MSK/EXT:  Moves extremities.  LLE entirely wrapped.  Neurovascular intact in her toes. SKIN: no apparent skin lesion or wound NEURO: Awake and alert.  Oriented to self, place and person.  Follows commands.  No apparent focal neuro deficit. PSYCH: Calm. Normal affect.   Consultants:  Vascular surgery  Procedures: 4/21-mechanical thrombectomy of left lower extremity DVT  Microbiology summarized: None  Assessment and plan: Extensive left-sided DVT involving the common iliac vein, external and internal iliac veins in the abdomen extending down into the left leg from femoral vein down to  the posterior tibial veins.  Likely due to immobility.  CT angio chest negative for PE. -S/p mechanical thrombectomy by Dr. Susi Eric on 4/21 -Continue IV heparin   Dementia without behavioral disturbance: Not formally diagnosed.  Awake and alert.  Oriented to self, place and person but not time. -Reorientation and delirium precaution   Elevated LFTs: Could be due to the above.  She is also on a statin and Tylenol  at home.  Acute hepatitis and Tylenol  level within normal.  Acute hepatitis panel negative.  RUQ US  negative.  Improved after mechanical thrombectomy -Continue holding statin -Check RUQ US . - Continue monitoring  Essential hypertension-continue home Toprol -XL  Hyponatremia: Urine sodium only 13.  TSH 5.1.  Cortisol normal.  Improved - Continue holding Effexor .  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 - Hold Effexor  due to worsening hyponatremia  GERD-continue home PPI  Moderate malnutrition Body mass index is 21.63 kg/m. Nutrition Problem: Moderate Malnutrition Etiology: chronic illness Signs/Symptoms: moderate fat depletion, moderate muscle depletion Interventions: Refer to RD note for recommendations   DVT prophylaxis:  On full dose anticoagulation  Code Status: Full code Family Communication: None at bedside. Level of care: Telemetry Medical Status is: Inpatient Remains inpatient appropriate because: Extensive lower extremity DVT   Final disposition: To be determined after surgery   55 minutes with more than 50% spent in reviewing records, counseling patient/family and coordinating care.   Sch Meds:  Scheduled Meds:  metoprolol  succinate  50 mg Oral Daily  pantoprazole   40 mg Oral Daily   sodium bicarbonate   650 mg Oral BID   Continuous Infusions:  heparin  1,200 Units/hr (04/04/24 2050)   PRN Meds:.acetaminophen  **OR** acetaminophen , bisacodyl , melatonin, ondansetron  **OR** ondansetron  (ZOFRAN ) IV, senna-docusate  Antimicrobials: Anti-infectives (From  admission, onward)    None        I have personally reviewed the following labs and images: CBC: Recent Labs  Lab 04/01/24 1330 04/02/24 0357 04/03/24 0520 04/04/24 0427 04/05/24 0814  WBC 9.5 10.8* 7.6 7.8 6.9  HGB 13.6 12.5 11.3* 10.9* 10.8*  HCT 43.2 37.3 33.1* 33.0* 31.9*  MCV 97.5 91.0 89.7 89.9 90.4  PLT 211 231 252 298 297   BMP &GFR Recent Labs  Lab 04/01/24 1453 04/02/24 0357 04/03/24 0520 04/04/24 0830 04/05/24 0814  NA 135 132* 130* 128* 132*  K 4.7 4.7 4.2 4.3 3.6  CL 104 99 97* 99 102  CO2 20* 20* 21* 18* 19*  GLUCOSE 112* 111* 102* 104* 116*  BUN 26* 25* 22 18 12   CREATININE 0.72 0.83 0.78 0.71 0.55  CALCIUM  8.0* 9.3 8.5* 8.6* 8.5*  MG  --  2.3  --  2.1 1.9  PHOS  --   --   --   --  2.8   Estimated Creatinine Clearance: 48.8 mL/min (by C-G formula based on SCr of 0.55 mg/dL). Liver & Pancreas: Recent Labs  Lab 04/01/24 1453 04/02/24 0357 04/03/24 0520 04/04/24 0830 04/05/24 0814  AST 68* 47* 97* 118* 75*  ALT 110* 101* 127* 144* 108*  ALKPHOS 169* 186* 243* 288* 273*  BILITOT 1.6* 1.2 1.0 1.2 1.1  PROT 6.5 7.5 6.8 6.6 6.0*  ALBUMIN 2.3* 2.7* 2.3* 2.2* 2.0*   No results for input(s): "LIPASE", "AMYLASE" in the last 168 hours. Recent Labs  Lab 04/01/24 1330  AMMONIA 24   Diabetic: No results for input(s): "HGBA1C" in the last 72 hours. No results for input(s): "GLUCAP" in the last 168 hours. Cardiac Enzymes: Recent Labs  Lab 04/02/24 0357  CKTOTAL 40   No results for input(s): "PROBNP" in the last 8760 hours. Coagulation Profile: No results for input(s): "INR", "PROTIME" in the last 168 hours. Thyroid  Function Tests: Recent Labs    04/04/24 0830  TSH 5.123*   Lipid Profile: No results for input(s): "CHOL", "HDL", "LDLCALC", "TRIG", "CHOLHDL", "LDLDIRECT" in the last 72 hours. Anemia Panel: No results for input(s): "VITAMINB12", "FOLATE", "FERRITIN", "TIBC", "IRON", "RETICCTPCT" in the last 72 hours. Urine analysis:     Component Value Date/Time   COLORURINE AMBER (A) 04/01/2024 1715   APPEARANCEUR HAZY (A) 04/01/2024 1715   LABSPEC 1.030 04/01/2024 1715   PHURINE 5.0 04/01/2024 1715   GLUCOSEU NEGATIVE 04/01/2024 1715   GLUCOSEU NEGATIVE 08/15/2016 0929   HGBUR NEGATIVE 04/01/2024 1715   BILIRUBINUR NEGATIVE 04/01/2024 1715   BILIRUBINUR negative 09/08/2016 1412   KETONESUR NEGATIVE 04/01/2024 1715   PROTEINUR 30 (A) 04/01/2024 1715   UROBILINOGEN 0.2 09/08/2016 1412   UROBILINOGEN 0.2 08/15/2016 0929   NITRITE NEGATIVE 04/01/2024 1715   LEUKOCYTESUR MODERATE (A) 04/01/2024 1715   Sepsis Labs: Invalid input(s): "PROCALCITONIN", "LACTICIDVEN"  Microbiology: Recent Results (from the past 240 hours)  Blood culture (routine x 2)     Status: None (Preliminary result)   Collection Time: 04/01/24  1:15 PM   Specimen: BLOOD  Result Value Ref Range Status   Specimen Description BLOOD LEFT ANTECUBITAL  Final   Special Requests   Final    BOTTLES DRAWN AEROBIC ONLY Blood Culture results may  not be optimal due to an inadequate volume of blood received in culture bottles   Culture   Final    NO GROWTH 4 DAYS Performed at [facility], [address] [place], [place] [address]    Report Status PENDING  Incomplete  Blood culture (routine x 2)     Status: None (Preliminary result)   Collection Time: 04/01/24  1:20 PM   Specimen: BLOOD RIGHT FOREARM  Result Value Ref Range Status   Specimen Description BLOOD RIGHT FOREARM  Final   Special Requests   Final    BOTTLES DRAWN AEROBIC ONLY Blood Culture results may not be optimal due to an inadequate volume of blood received in culture bottles   Culture   Final    NO GROWTH 4 DAYS Performed at [facility], [address], [place], [place] [address]    Report Status PENDING  Incomplete  Resp panel by RT-PCR (RSV, Flu A&B, Covid) Anterior Nasal Swab     Status: None   Collection Time: 04/01/24  1:24 PM   Specimen: Anterior Nasal Swab   Result Value Ref Range Status   SARS Coronavirus 2 by RT PCR NEGATIVE NEGATIVE Final   Influenza A by PCR NEGATIVE NEGATIVE Final   Influenza B by PCR NEGATIVE NEGATIVE Final    Comment: (NOTE) The Xpert Xpress SARS-CoV-2/FLU/RSV plus assay is intended as an aid in the diagnosis of influenza from Nasopharyngeal swab specimens and should not be used as a sole basis for treatment. Nasal washings and aspirates are unacceptable for Xpert Xpress SARS-CoV-2/FLU/RSV testing.  Fact Sheet for Patients: BloggerCourse.com  Fact Sheet for Healthcare Providers: SeriousBroker.it  This test is not yet approved or cleared by the [place]  FDA and has been authorized for detection and/or diagnosis of SARS-CoV-2 by FDA under an Emergency Use Authorization (EUA). This EUA will remain in effect (meaning this test can be used) for the duration of the COVID-19 declaration under Section 564(b)(1) of the Act, 21 U.S.C. section 360bbb-3(b)(1), unless the authorization is terminated or revoked.     Resp Syncytial Virus by PCR NEGATIVE NEGATIVE Final    Comment: (NOTE) Fact Sheet for Patients: BloggerCourse.com  Fact Sheet for Healthcare Providers: SeriousBroker.it  This test is not yet approved or cleared by the [place]  FDA and has been authorized for detection and/or diagnosis of SARS-CoV-2 by FDA under an Emergency Use Authorization (EUA). This EUA will remain in effect (meaning this test can be used) for the duration of the COVID-19 declaration under Section 564(b)(1) of the Act, 21 U.S.C. section 360bbb-3(b)(1), unless the authorization is terminated or revoked.  Performed at [facility], [address], [place], [place] [address]     Radiology Studies: US  Abdomen Limited RUQ (LIVER/GB) Result Date: 04/05/2024 CLINICAL DATA:  Elevated LFTs. EXAM: ULTRASOUND ABDOMEN LIMITED  RIGHT UPPER QUADRANT COMPARISON:  None Available. FINDINGS: Gallbladder: No gallstones or wall thickening visualized. No sonographic Murphy sign noted by sonographer. Common bile duct: Diameter: 4.6 mm. Liver: No focal lesion identified. Within normal limits in parenchymal echogenicity. Portal vein is patent on color Doppler imaging with normal direction of blood flow towards the liver. Other: None. IMPRESSION: Normal right upper quadrant ultrasound. Electronically Signed   By: Kimberley Penman M.D.   On: 04/05/2024 05:31   PERIPHERAL VASCULAR CATHETERIZATION Result Date: 04/04/2024 Images from the original result were not included. Patient name: KATHERENE DININO MRN: [id number] DOB: July 01, 1941 Sex: female 04/01/2024 - 04/04/2024 Pre-operative Diagnosis: Left iliofemoral  DVT Post-operative diagnosis:  Same Surgeon:  Philipp Brawn, MD Procedure Performed: Ultrasound-guided access of left popliteal vein Intravascular ultrasound of left popliteal, femoral, common femoral veins Intravascular ultrasound of the left external iliac, common iliac vein and IVC Mechanical thrombectomy of left common iliac, external iliac, common femoral, femoral and popliteal veins Left leg venogram 43 minutes moderate sedation with fentanyl  and Versed  Indications: Ms. Langhorne is a 83 year old female who presented to the emergency department over the weekend with 1 week history of encephalopathy, poor p.o. intake and lethargy.  She was also noted to have a significantly swollen left lower extremity which was reported started over the last few days.  She was found to have an extensive left lower extremity DVT with extension into the left common iliac vein.  Mechanical thrombectomy was offered, risk and benefits were reviewed, the d husband expressed understanding and elected to proceed. Findings: Thrombosis of the left common iliac, external iliac, common femoral, femoral and popliteal veins Acute thrombus retrieved from the left common iliac,  external iliac, common femoral, femoral and popliteal veins. No compression of the left common iliac vein.  No thrombus noted in the IVC. Brisk flow through the left leg and into the IVC on completion  Procedure:  The patient was identified in the holding area and taken to the cath lab  The patient was then placed supine on the table and prepped and draped in the usual sterile fashion.  A time out was called.  Ultrasound was used to evaluate the left popliteal vein.  It was occluded and noncompressible.  This was accessed under ultrasound guidance and the micropuncture sheath was placed.  This was then upsized to an 8 [place] sheath over a glide advantage wire.  Intravascular ultrasound was used which demonstrated the above findings.  The patient was then systemically heparinized with 5000 units heparin  and the 8 French sheath was exchanged for the 13 [place] Inari sheath.  The catheter was advanced into the IVC, basket released and gently exchanged back into the sheath.  A total of 4 passes were made in 90 degree obliques.  Each pass retrieved acute thrombus.  After the fourth pass IVUS was readvanced which demonstrated a small femoral vein but a widely patent left common femoral, external iliac and common iliac vein.  A completion venogram was then obtained which demonstrated brisk flow and wide patency of the treated veins.  The wire and sheath were removed and a figure-of-eight suture was placed at the access site with Monocryl suture. Contrast: 15cc Sedation: 43 minutes Impression: Successful thrombectomy of the left lower extremity with retrieval of acute thrombus and brisk flow from the left popliteal into the IVC. Philipp Brawn MD [facility] Office: [phone]     Shanee Batch T. Jaymir Struble [facility]  If 7PM-7AM, please contact night-coverage [email or web address] 04/05/2024, 1:44 PM

## 2024-04-05 NOTE — Progress Notes (Signed)
 Left lower extremity measurements.  Ankle 21 cm. Calf 29 cm.  Thigh 38 cm.

## 2024-04-05 NOTE — Discharge Instructions (Signed)
Discharge Instructions for DVT (Deep Vein Thrombosis):  Call 911 or visit the nearest emergency room if you experience any of the following:  Sudden chest tightness or pain. Sharp pain when taking a deep breath. Cough with bloody mucus. Sudden shortness of breath or fast breathing A fast heart rate. Cold skin, clammy skin, or sweating. New swelling in the arms or legs.  Continue to take your anticoagulation medication daily as prescribed Elevate your affected extremity daily above the level of the heart Wear your thigh high compression stocking daily You will have follow up with your Vascular provider in 1 month with an ultrasound

## 2024-04-05 NOTE — Progress Notes (Signed)
 PHARMACY - ANTICOAGULATION CONSULT NOTE  Pharmacy Consult for heparin  Indication: DVT  Allergies  Allergen Reactions   Levaquin  [Levofloxacin  In D5w] Nausea Only    Dizziness and tremulousness    Patient Measurements: Height: 5\' 5"  (165.1 cm) Weight: 59 kg (130 lb) IBW/kg (Calculated) : 57 HEPARIN  DW (KG): 59  Vital Signs: Temp: 98.8 F (37.1 C) (04/22 0738) Temp Source: Oral (04/22 0738) BP: 151/75 (04/22 0845) Pulse Rate: 92 (04/22 0845)  Labs: Recent Labs    04/03/24 0520 04/04/24 0427 04/04/24 0830 04/05/24 0814  HGB 11.3* 10.9*  --  10.8*  HCT 33.1* 33.0*  --  31.9*  PLT 252 298  --  297  HEPARINUNFRC 0.46 0.34  --  0.45  CREATININE 0.78  --  0.71  --     Estimated Creatinine Clearance: 48.8 mL/min (by C-G formula based on SCr of 0.71 mg/dL).   Medical History: Past Medical History:  Diagnosis Date   Actinic keratosis 12/03/2012   Allergy     Anxiety    Effexor  helps--psychiatrist Dr Deborra Falter   Arthritis    osteoarthritis, hands   Atypical chest pain    EF 86%, breast attenuation, no significant ischemia   Cataract    Cervical radiculopathy 11/18/2013   Chronic headache    takes Metoprolol  for this   Diverticulosis    Erosive esophagitis    GERD (gastroesophageal reflux disease) 11/2009   EGD: Dr. 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 No AC PTA. Pharmacy to dose IV heparin .  Heparin  level therapeutic this AM  Goal of Therapy:  Heparin  level 0.3-0.7  units/ml Monitor platelets by anticoagulation protocol: Yes   Plan:  -Continue heparin  at 1200 units/hr -Monitor heparin  level, CBC, and S/Sx bleeding daily  Follow up for transition to po AC.  Thank you. Lennice Quivers, PharmD 04/05/2024 9:11 AM

## 2024-04-06 DIAGNOSIS — K219 Gastro-esophageal reflux disease without esophagitis: Secondary | ICD-10-CM | POA: Diagnosis not present

## 2024-04-06 DIAGNOSIS — I10 Essential (primary) hypertension: Secondary | ICD-10-CM | POA: Diagnosis not present

## 2024-04-06 DIAGNOSIS — E440 Moderate protein-calorie malnutrition: Secondary | ICD-10-CM

## 2024-04-06 DIAGNOSIS — K5900 Constipation, unspecified: Secondary | ICD-10-CM | POA: Diagnosis not present

## 2024-04-06 DIAGNOSIS — I82412 Acute embolism and thrombosis of left femoral vein: Secondary | ICD-10-CM | POA: Diagnosis not present

## 2024-04-06 DIAGNOSIS — Z48812 Encounter for surgical aftercare following surgery on the circulatory system: Secondary | ICD-10-CM

## 2024-04-06 DIAGNOSIS — Z9889 Other specified postprocedural states: Secondary | ICD-10-CM

## 2024-04-06 LAB — COMPREHENSIVE METABOLIC PANEL WITH GFR
ALT: 108 U/L — ABNORMAL HIGH (ref 0–44)
AST: 77 U/L — ABNORMAL HIGH (ref 15–41)
Albumin: 1.9 g/dL — ABNORMAL LOW (ref 3.5–5.0)
Alkaline Phosphatase: 237 U/L — ABNORMAL HIGH (ref 38–126)
Anion gap: 10 (ref 5–15)
BUN: 9 mg/dL (ref 8–23)
CO2: 21 mmol/L — ABNORMAL LOW (ref 22–32)
Calcium: 8.6 mg/dL — ABNORMAL LOW (ref 8.9–10.3)
Chloride: 106 mmol/L (ref 98–111)
Creatinine, Ser: 0.63 mg/dL (ref 0.44–1.00)
GFR, Estimated: >60 mL/min (ref >60)
Glucose, Bld: 113 mg/dL — ABNORMAL HIGH (ref 70–99)
Potassium: 3.8 mmol/L (ref 3.5–5.1)
Sodium: 137 mmol/L (ref 135–145)
Total Bilirubin: 0.6 mg/dL (ref 0.0–1.2)
Total Protein: 6 g/dL — ABNORMAL LOW (ref 6.5–8.1)

## 2024-04-06 LAB — CBC
HCT: 32.1 % — ABNORMAL LOW (ref 36.0–46.0)
Hemoglobin: 10.6 g/dL — ABNORMAL LOW (ref 12.0–15.0)
MCH: 29.7 pg (ref 26.0–34.0)
MCHC: 33 g/dL (ref 30.0–36.0)
MCV: 89.9 fL (ref 80.0–100.0)
Platelets: 356 10*3/uL (ref 150–400)
RBC: 3.57 MIL/uL — ABNORMAL LOW (ref 3.87–5.11)
RDW: 12.3 % (ref 11.5–15.5)
WBC: 6.4 10*3/uL (ref 4.0–10.5)
nRBC: 0 % (ref 0.0–0.2)

## 2024-04-06 LAB — HEPARIN LEVEL (UNFRACTIONATED): Heparin Unfractionated: 0.23 [IU]/mL — ABNORMAL LOW (ref 0.30–0.70)

## 2024-04-06 LAB — CULTURE, BLOOD (ROUTINE X 2)
Culture: NO GROWTH
Culture: NO GROWTH

## 2024-04-06 LAB — MAGNESIUM: Magnesium: 1.9 mg/dL (ref 1.7–2.4)

## 2024-04-06 MED ORDER — APIXABAN 5 MG PO TABS
5.0000 mg | ORAL_TABLET | Freq: Two times a day (BID) | ORAL | Status: DC
  Administered 2024-04-06 – 2024-04-07 (×3): 5 mg via ORAL
  Filled 2024-04-06 (×3): qty 1

## 2024-04-06 MED ORDER — VENLAFAXINE HCL ER 75 MG PO CP24
75.0000 mg | ORAL_CAPSULE | Freq: Every day | ORAL | Status: DC
  Administered 2024-04-06 – 2024-04-07 (×2): 75 mg via ORAL
  Filled 2024-04-06 (×2): qty 1

## 2024-04-06 NOTE — Evaluation (Signed)
 Occupational Therapy Evaluation Patient Details Name: [name] MRN: [id number] DOB: [birth year] Today's Date: 04/06/2024   History of Present Illness   83 y.o. female presents to [facility] 04/01/24 with swollen LLE. Pt with LLE DVT, s/p mechanical thrombectomy 4/21. PMHx: HTN, GERD, cognitive impairment, OA     Clinical Impressions Pt lives with her supportive husband. She typically walks with not AD and is completes ADLs independently. Her husband assists with IADLs. Two weeks leading up to admission, pt has been needing increasing assistance for all ADLs, transfers and mobility. They have been using pt's rollator to transport pt in the home. Pt presents with impaired cognition, generalized weakness and poor standing balance with posterior lean upon standing. She needs mod to max assist for transfers and min to total assist for ADLs. Husband states he is able to manage pt at home. Recommending HHOT.      If plan is discharge home, recommend the following:   Two people to help with walking and/or transfers;A lot of help with bathing/dressing/bathroom;Assistance with feeding;Assistance with cooking/housework;Direct supervision/assist for medications management;Direct supervision/assist for financial management;Assist for transportation;Help with stairs or ramp for entrance     Functional Status Assessment   Patient has had a recent decline in their functional status and/or demonstrates limited ability to make significant improvements in function in a reasonable and predictable amount of time     Equipment Recommendations   Wheelchair (measurements OT);Wheelchair cushion (measurements OT)     Recommendations for Other Services         Precautions/Restrictions   Precautions Precautions: Fall Recall of Precautions/Restrictions: Impaired Restrictions Weight Bearing Restrictions Per Provider Order: No     Mobility Bed Mobility               General bed mobility  comments: in chair    Transfers Overall transfer level: Needs assistance Equipment used: None Transfers: Sit to/from Stand, Bed to chair/wheelchair/BSC Sit to Stand: Max assist Stand pivot transfers: Mod assist                Balance Overall balance assessment: Needs assistance, Mild deficits observed, not formally tested Sitting-balance support: No upper extremity supported, Feet supported Sitting balance-Leahy Scale: Fair     Standing balance support: Bilateral upper extremity supported, During functional activity, Reliant on assistive device for balance Standing balance-Leahy Scale: Poor                             ADL either performed or assessed with clinical judgement   ADL Overall ADL's : Needs assistance/impaired Eating/Feeding: Minimal assistance;Sitting                                     General ADL Comments: pt is dependent in all ADLs with exception of self feeding with min assist     Vision Ability to See in Adequate Light: 0 Adequate Patient Visual Report: No change from baseline       Perception         Praxis         Pertinent Vitals/Pain Pain Assessment Pain Assessment: Faces Faces Pain Scale: Hurts a little bit Pain Location: B feet Pain Descriptors / Indicators: Aching, Discomfort Pain Intervention(s): Monitored during session, Repositioned     Extremity/Trunk Assessment Upper Extremity Assessment Upper Extremity Assessment: Generalized weakness   Lower Extremity Assessment Lower Extremity Assessment: Defer to  PT evaluation   Cervical / Trunk Assessment Cervical / Trunk Assessment: Normal   Communication Communication Communication: Other (comment) (minimally verbal)   Cognition Arousal: Alert Behavior During Therapy: WFL for tasks assessed/performed Cognition: Cognition impaired   Orientation impairments: Place, Time, Situation Awareness: Intellectual awareness impaired, Online awareness  impaired Memory impairment (select all impairments): Short-term memory, Working [employer] fast streamer, Non-declarative long-term memory, Geneticist, molecular long-term memory Attention impairment (select first level of impairment): Focused attention Executive functioning impairment (select all impairments): Initiation, Organization, Sequencing, Reasoning, Problem solving OT - Cognition Comments: pt minimally verbal during session                 Following commands: Impaired Following commands impaired: Follows one step commands inconsistently, Follows one step commands with increased time     Cueing  General Comments   Cueing Techniques: Verbal cues;Tactile cues  husband has ordered a purewick system for home, he has bed pads and is aware of how to use to assist with bed mobility   Exercises     Shoulder Instructions      Home Living Family/patient expects to be discharged to:: Private residence Living Arrangements: Spouse/significant other Available Help at Discharge: Family;Available 24 hours/day Type of Home: House Home Access: Stairs to enter [employer] of Steps: 3 Entrance Stairs-Rails: Right Home Layout: One level     Bathroom Shower/Tub: Tub/shower unit;Walk-in shower   Bathroom Toilet: Standard     Home Equipment: Shower seat - built Charity fundraiser (2 wheels);Rollator (4 wheels);BSC/3in1          Prior Functioning/Environment Prior Level of Function : Needs assist             Mobility Comments: was independent with no AD prior to a few weeks ago. 2 weeks ago pt needed assist to stand and pivot. Husband would push on rollator for mobility ADLs Comments: Ind with ADLs, drives in the neighborhood. Husband cooks and manages appointments and meds at baseline, in the weeks leading up to admission, needed max assist    OT Problem List: Decreased strength;Impaired balance (sitting and/or standing);Decreased cognition;Decreased safety awareness;Decreased knowledge  of use of DME or AE   OT Treatment/Interventions: Self-care/ADL training;DME and/or AE instruction;Patient/family education;Balance training;Therapeutic activities      OT Goals(Current goals can be found in the care plan section)   Acute Rehab OT Goals OT Goal Formulation: With family Time For Goal Achievement: 04/20/24 Potential to Achieve Goals: Good ADL Goals Pt Will Perform Eating: with set-up;sitting Pt Will Perform Grooming: with min assist;sitting Pt Will Perform Upper Body Bathing: with mod assist;sitting Pt Will Perform Upper Body Dressing: with min assist;sitting Pt Will Transfer to Toilet: with min assist;stand pivot transfer;bedside commode   OT Frequency:  Min 2X/week    Co-evaluation              AM-PAC OT "6 Clicks" Daily Activity     Outcome Measure Help from another person eating meals?: A Little Help from another person taking care of personal grooming?: A Lot Help from another person toileting, which includes using toliet, bedpan, or urinal?: Total Help from another person bathing (including washing, rinsing, drying)?: Total Help from another person to put on and taking off regular upper body clothing?: A Lot Help from another person to put on and taking off regular lower body clothing?: Total 6 Click Score: 10   End of Session Equipment Utilized During Treatment: Gait belt Nurse Communication: Other (comment) (IV alarming)  Activity Tolerance: Patient tolerated treatment well Patient left:  in chair;with call bell/phone within reach;with chair alarm set;with family/visitor present  OT Visit Diagnosis: Muscle weakness (generalized) (M62.81);Other symptoms and signs involving cognitive function;Unsteadiness on feet (R26.81)                Time: 3664-4034 OT Time Calculation (min): 28 min Charges:  OT General Charges $OT Visit: 1 Visit OT Evaluation $OT Eval Moderate Complexity: 1 Mod OT Treatments $Self Care/Home Management : 8-22 mins Avanell Leigh,  OTR/L Acute Rehabilitation Services Office: [phone]   Jonette Nestle 04/06/2024, 1:44 PM

## 2024-04-06 NOTE — Progress Notes (Signed)
 PHARMACY - ANTICOAGULATION CONSULT NOTE  Pharmacy Consult for heparin  Indication: DVT  Allergies  Allergen Reactions   Levaquin  [Levofloxacin  In D5w] Nausea Only    Dizziness and tremulousness    Patient Measurements: Height: 5\' 5"  (165.1 cm) Weight: 59 kg (130 lb) IBW/kg (Calculated) : 57 HEPARIN  DW (KG): 59  Vital Signs: Temp: 98 F (36.7 C) (04/23 0752) Temp Source: Oral (04/23 0752) BP: 131/57 (04/23 0752) Pulse Rate: 83 (04/23 0500)  Labs: Recent Labs    04/04/24 0427 04/04/24 0830 04/05/24 0814 04/06/24 0442  HGB 10.9*  --  10.8* 10.6*  HCT 33.0*  --  31.9* 32.1*  PLT 298  --  297 356  HEPARINUNFRC 0.34  --  0.45 0.23*  CREATININE  --  0.71 0.55 0.63    Estimated Creatinine Clearance: 48.8 mL/min (by C-G formula based on SCr of 0.63 mg/dL).   Medical History: Past Medical History:  Diagnosis Date   Actinic keratosis 12/03/2012   Allergy     Anxiety    Effexor  helps--psychiatrist Dr Deborra Falter   Arthritis    osteoarthritis, hands   Atypical chest pain    EF 86%, breast attenuation, no significant ischemia   Cataract    Cervical radiculopathy 11/18/2013   Chronic headache    takes Metoprolol  for this   Diverticulosis    Erosive esophagitis    GERD (gastroesophageal reflux disease) 11/2009   EGD: Dr. 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 No AC PTA. Pharmacy to dose IV heparin .  Heparin  level low this AM  Goal of Therapy:  Heparin  level 0.3-0.7  units/ml Monitor platelets by anticoagulation protocol: Yes   Plan:  -Heparin  to 1300 units / hr -Monitor heparin  level, CBC, and S/Sx bleeding daily  Follow up for transition to po AC.  Thank you. Lennice Quivers, PharmD 04/06/2024 8:32 AM

## 2024-04-06 NOTE — Progress Notes (Addendum)
  Progress Note    04/06/2024 7:33 AM 2 Days Post-Op  Subjective:  resting comfortably   Vitals:   04/06/24 0400 04/06/24 0500  BP:  137/61  Pulse: 82 83  Resp: 18 17  Temp:  97.7 F (36.5 C)  SpO2:  100%   Physical Exam: Cardiac:  regular Lungs:  non labored Extremities:  LLE swelling improved, motor and sensation intact, thigh high compression stocking in place Neurologic: sleepy, answers appropriately   CBC    Component Value Date/Time   WBC 6.4 04/06/2024 0442   RBC 3.57 (L) 04/06/2024 0442   HGB 10.6 (L) 04/06/2024 0442   HCT 32.1 (L) 04/06/2024 0442   PLT 356 04/06/2024 0442   MCV 89.9 04/06/2024 0442   MCH 29.7 04/06/2024 0442   MCHC 33.0 04/06/2024 0442   RDW 12.3 04/06/2024 0442   LYMPHSABS 0.9 03/09/2023 1110   MONOABS 0.6 03/09/2023 1110   EOSABS 0.1 03/09/2023 1110   BASOSABS 0.0 03/09/2023 1110    BMET    Component Value Date/Time   NA 137 04/06/2024 0442   K 3.8 04/06/2024 0442   CL 106 04/06/2024 0442   CO2 21 (L) 04/06/2024 0442   GLUCOSE 113 (H) 04/06/2024 0442   BUN 9 04/06/2024 0442   CREATININE 0.63 04/06/2024 0442   CREATININE 0.71 09/10/2012 1205   CALCIUM  8.6 (L) 04/06/2024 0442   GFRNONAA >60 04/06/2024 0442   GFRAA >60 12/22/2015 0953    INR No results found for: "INR"   Intake/Output Summary (Last 24 hours) at 04/06/2024 0733 Last data filed at 04/06/2024 0504 Gross per 24 hour  Intake 782.36 ml  Output 2750 ml  Net -1967.64 ml     Assessment/Plan:  83 y.o. female is s/p Mechanical thrombectomy of LLE 2 Days Post-Op   Left leg well perfused and warm, swelling improved Continue thigh high compression stocking. Elevate leg when in bed or chair Benefit from PT eval. Mobilize as tolerated On heparin  gtt. Okay to transition to DOAC Follow up has been arranged in 1 month for LLE venous duplex   Deneen Finical, PA-C [facility] [phone] 04/06/2024 7:33 AM  VASCULAR STAFF ADDENDUM: I have  independently interviewed and examined the patient. I agree with the above.   Philipp Brawn MD [facility] Phone Number: [phone] 04/06/2024 7:54 AM

## 2024-04-06 NOTE — Evaluation (Addendum)
 Physical Therapy Evaluation Patient Details Name: [name] MRN: [id number] DOB: Sep 21, 1941 Today's Date: 04/06/2024  History of Present Illness  83 y.o. female presents to [facility] 04/01/24 with swollen LLE. Pt with LLE DVT, s/p mechanical thrombectomy 4/21. PMHx: HTN, GERD, cognitive impairment, OA  Clinical Impression  Pt in bed upon arrival with husband present and agreeable to PT eval. Prior to two weeks ago, pt was independent for mobility with no AD. In the past few weeks, pt had been unable to ambulate. Pt's husband would perform stand pivots to transfer to a rollator to use as a WC. In today's session, pt required ModA for bed mobility. She had difficulty standing with MaxA and RW due to significant posterior lean. Performed stand pivot with ModA using an anterior approach and gait belt to transfer to the recliner. Pt required multiple cues during the session to adhere to task and to sequence mobility. Husband was present during the session and reports being comfortable transferring pt at home. Recommending return home with current level of support and HHPT to work on independence with mobility and to reduce caregiver burden. Pt currently with functional limitations due to the deficits listed below (see PT Problem List). Acute PT to follow.         If plan is discharge home, recommend the following: A lot of help with walking and/or transfers;A lot of help with bathing/dressing/bathroom;Assistance with cooking/housework;Assist for transportation;Help with stairs or ramp for entrance     Equipment Recommendations Wheelchair (measurements PT);Wheelchair cushion (measurements PT)     Functional Status Assessment Patient has had a recent decline in their functional status and demonstrates the ability to make significant improvements in function in a reasonable and predictable amount of time.     Precautions / Restrictions Precautions Precautions: Fall Recall of Precautions/Restrictions:  Impaired Restrictions Weight Bearing Restrictions Per Provider Order: No      Mobility  Bed Mobility Overal bed mobility: Needs Assistance Bed Mobility: Supine to Sit  Supine to sit: Mod assist, HOB elevated    General bed mobility comments: able to move LE's towards EOB with increased time and multiple cues. Did not want assist to move LE's. ModA to raise trunk and shift hips forward using bed pad.    Transfers Overall transfer level: Needs assistance Equipment used: Rolling walker (2 wheels), None Transfers: Sit to/from Stand, Bed to chair/wheelchair/BSC Sit to Stand: Max assist Stand pivot transfers: Mod assist         General transfer comment: x3 stands with MaxA and RW. Significant posterior lean with pt unable to coordinate taking steps. Stand pivot to recliner with ModA via anterior approach and use of gait belt    Ambulation/Gait    General Gait Details: unable this date    Balance Overall balance assessment: Needs assistance, Mild deficits observed, not formally tested Sitting-balance support: No upper extremity supported, Feet supported Sitting balance-Leahy Scale: Fair     Standing balance support: Bilateral upper extremity supported, During functional activity, Reliant on assistive device for balance Standing balance-Leahy Scale: Poor Standing balance comment: reliant on external support and RW       Pertinent Vitals/Pain Pain Assessment Pain Assessment: Faces Faces Pain Scale: Hurts a little bit Pain Location: B feet Pain Descriptors / Indicators: Aching, Discomfort Pain Intervention(s): Limited activity within patient's tolerance, Monitored during session, Repositioned    Home Living Family/patient expects to be discharged to:: Private residence Living Arrangements: Spouse/significant other Available Help at Discharge: Family;Available 24 hours/day Type of Home: House  Home Access: Stairs to enter Entrance Stairs-Rails: Right Entrance  Stairs-Number of Steps: 3   Home Layout: One level Home Equipment: Shower seat - built [employer] fundraiser (2 wheels);Rollator (4 wheels);BSC/3in1      Prior Function Prior Level of Function : Independent/Modified Independent;Needs assist;Driving    Mobility Comments: was independent with no AD prior to a few weeks ago. 2 weeks ago pt needed assist to stand and pivot. Husband would push on rollator for mobility ADLs Comments: Ind with ADLs, drives in the neighborhood. Husband cooks     Extremity/Trunk Assessment   Upper Extremity Assessment Upper Extremity Assessment: Defer to OT evaluation    Lower Extremity Assessment Lower Extremity Assessment: Generalized weakness;Difficult to assess due to impaired cognition (L>R)    Cervical / Trunk Assessment Cervical / Trunk Assessment: Normal  Communication   Communication Communication: No apparent difficulties    Cognition Arousal: Alert Behavior During Therapy: WFL for tasks assessed/performed   PT - Cognitive impairments: Orientation, Awareness, Memory, Attention, Initiation, Sequencing, Problem solving, Safety/Judgement   Orientation impairments: Time, Situation    PT - Cognition Comments: increased difficulty adhering to task and sequencing tasks. Easily distracted by external stimuli Following commands: Impaired Following commands impaired: Follows one step commands inconsistently, Follows one step commands with increased time     Cueing Cueing Techniques: Verbal cues, Tactile cues     General Comments General comments (skin integrity, edema, etc.): Husband present and supportive throughout session     PT Assessment Patient needs continued PT services  PT Problem List Decreased strength;Decreased range of motion;Decreased activity tolerance;Decreased balance;Decreased mobility;Decreased cognition;Decreased knowledge of use of DME;Decreased safety awareness       PT Treatment Interventions DME instruction;Gait  training;Stair training;Functional mobility training;Therapeutic exercise;Therapeutic activities;Balance training;Neuromuscular re-education;Patient/family education    PT Goals (Current goals can be found in the Care Plan section)  Acute Rehab PT Goals Patient Stated Goal: to get better PT Goal Formulation: With patient/family Time For Goal Achievement: 04/20/24 Potential to Achieve Goals: Fair    Frequency Min 2X/week        AM-PAC PT "6 Clicks" Mobility  Outcome Measure Help needed turning from your back to your side while in a flat bed without using bedrails?: A Lot Help needed moving from lying on your back to sitting on the side of a flat bed without using bedrails?: A Lot Help needed moving to and from a bed to a chair (including a wheelchair)?: A Lot Help needed standing up from a chair using your arms (e.g., wheelchair or bedside chair)?: A Lot Help needed to walk in hospital room?: Total Help needed climbing 3-5 steps with a railing? : Total 6 Click Score: 10    End of Session Equipment Utilized During Treatment: Gait belt Activity Tolerance: Patient tolerated treatment well Patient left: in chair;with call bell/phone within reach;with chair alarm set;with family/visitor present Nurse Communication: Mobility status;Need for lift equipment PT Visit Diagnosis: Unsteadiness on feet (R26.81);Other abnormalities of gait and mobility (R26.89);Muscle weakness (generalized) (M62.81)    Time: 1610-9604 PT Time Calculation (min) (ACUTE ONLY): 40 min   Charges:   PT Evaluation $PT Eval Low Complexity: 1 Low PT Treatments $Therapeutic Activity: 23-37 mins PT General Charges $$ ACUTE PT VISIT: 1 Visit         Orysia Blas, PT, DPT Secure Chat Preferred  Rehab Office [phone]   Alissa April Adela Ades 04/06/2024, 10:50 AM

## 2024-04-06 NOTE — Plan of Care (Signed)
   Problem: Education: Goal: Knowledge of General Education information will improve Description Including pain rating scale, medication(s)/side effects and non-pharmacologic comfort measures Outcome: Progressing   Problem: Health Behavior/Discharge Planning: Goal: Ability to manage health-related needs will improve Outcome: Progressing

## 2024-04-06 NOTE — Progress Notes (Signed)
 PROGRESS NOTE  [name]:[id number] DOB: 09/23/1941   PCP: Alejandro Hurt, FNP  Patient is from: Home.  Lives with husband.  Dependent for ADLs.  DOA: 04/01/2024 LOS: 5  Chief complaints No chief complaint on file.    Brief Narrative / Interim history: 83 year old F with PMH of cognitive impairment, HTN and GERD presenting with progressive left leg swelling and pain, and admitted with extensive left lower extremity DVT.  Patient was started on IV heparin .  Vascular surgery consulted.  Plan for mechanical thrombectomy on 4/21.  CT angio chest negative for PE.  Patient underwent mechanical thrombectomy by Dr. Susi Eric on 4/21.  No postop complication.  Transitioned to Eliquis  on 4/23.  Likely discharge home on 4/24  Subjective: Seen and examined earlier this morning.  No major events overnight of this morning.  Reports some pain in her leg.  Had an episode of emesis.  Denies nausea or abdominal pain.  Patient's husband at bedside.  Objective: Vitals:   04/06/24 0000 04/06/24 0400 04/06/24 0500 04/06/24 0752  BP:   137/61 (!) 131/57  Pulse: 88 82 83   Resp: 17 18 17    Temp:   97.7 F (36.5 C) 98 F (36.7 C)  TempSrc:   Oral Oral  SpO2:   100% 98%  Weight:      Height:        Examination:  GENERAL: No apparent distress.  Nontoxic. HEENT: MMM.  Vision and hearing grossly intact.  NECK: Supple.  No apparent JVD.  RESP:  No IWOB.  Fair aeration bilaterally. CVS:  RRR. Heart sounds normal.  ABD/GI/GU: BS+. Abd soft, NTND.  MSK/EXT:  Moves extremities.  LLE in TED hose. SKIN: no apparent skin lesion or wound NEURO: Awake and alert.  Oriented fairly.  Follows commands.  No apparent focal neuro deficit. PSYCH: Calm. Normal affect.   Consultants:  Vascular surgery  Procedures: 4/21-mechanical thrombectomy of left lower extremity DVT  Microbiology summarized: None  Assessment and plan: Extensive left-sided DVT involving the common iliac vein, external and  internal iliac veins in the abdomen extending down into the left leg from femoral vein down to the posterior tibial veins.  Likely due to immobility.  CT angio chest negative for PE. -S/p mechanical thrombectomy by Dr. Susi Eric on 4/21 - Transition to Eliquis  per vascular surgery - Continue TED hose and PT/OT  Dementia without behavioral disturbance: Not formally diagnosed.  Awake and alert.  Oriented to self, place and person but not time. -Reorientation and delirium precaution   Elevated LFTs: Could be due to the above.  She is also on a statin and Tylenol  at home.  Acute hepatitis and Tylenol  level within normal.  Acute hepatitis panel negative.  RUQ US  negative.  Improved after mechanical thrombectomy -Continue holding statin -Continue monitoring  Essential hypertension-continue home Toprol -XL  Hyponatremia: Urine sodium only 13.  TSH 5.1.  Cortisol normal.  Resolved  Mood disorder: On Effexor  at home. - Resume home Effexor .  GERD-continue home PPI  Moderate malnutrition Body mass index is 21.63 kg/m. Nutrition Problem: Moderate Malnutrition Etiology: chronic illness Signs/Symptoms: moderate fat depletion, moderate muscle depletion Interventions: Refer to RD note for recommendations   DVT prophylaxis:  On full dose anticoagulation  Code Status: Full code Family Communication: Updated patient's husband at bedside. Level of care: Telemetry Medical Status is: Inpatient Remains inpatient appropriate because: Extensive lower extremity DVT   Final disposition: Likely home on 4/24   35 minutes with more than 50% spent in  reviewing records, counseling patient/family and coordinating care.   Sch Meds:  Scheduled Meds:  metoprolol  succinate  50 mg Oral Daily   pantoprazole   40 mg Oral Daily   sodium bicarbonate   650 mg Oral BID   venlafaxine  XR  75 mg Oral Q breakfast   Continuous Infusions:  heparin  1,300 Units/hr (04/06/24 1245)   PRN Meds:.acetaminophen  **OR**  acetaminophen , bisacodyl , melatonin, ondansetron  **OR** ondansetron  (ZOFRAN ) IV, senna-docusate  Antimicrobials: Anti-infectives (From admission, onward)    None        I have personally reviewed the following labs and images: CBC: Recent Labs  Lab 04/02/24 0357 04/03/24 0520 04/04/24 0427 04/05/24 0814 04/06/24 0442  WBC 10.8* 7.6 7.8 6.9 6.4  HGB 12.5 11.3* 10.9* 10.8* 10.6*  HCT 37.3 33.1* 33.0* 31.9* 32.1*  MCV 91.0 89.7 89.9 90.4 89.9  PLT 231 252 298 297 356   BMP &GFR Recent Labs  Lab 04/02/24 0357 04/03/24 0520 04/04/24 0830 04/05/24 0814 04/06/24 0442  NA 132* 130* 128* 132* 137  K 4.7 4.2 4.3 3.6 3.8  CL 99 97* 99 102 106  CO2 20* 21* 18* 19* 21*  GLUCOSE 111* 102* 104* 116* 113*  BUN 25* 22 18 12 9   CREATININE 0.83 0.78 0.71 0.55 0.63  CALCIUM  9.3 8.5* 8.6* 8.5* 8.6*  MG 2.3  --  2.1 1.9 1.9  PHOS  --   --   --  2.8  --    Estimated Creatinine Clearance: 48.8 mL/min (by C-G formula based on SCr of 0.63 mg/dL). Liver & Pancreas: Recent Labs  Lab 04/02/24 0357 04/03/24 0520 04/04/24 0830 04/05/24 0814 04/06/24 0442  AST 47* 97* 118* 75* 77*  ALT 101* 127* 144* 108* 108*  ALKPHOS 186* 243* 288* 273* 237*  BILITOT 1.2 1.0 1.2 1.1 0.6  PROT 7.5 6.8 6.6 6.0* 6.0*  ALBUMIN 2.7* 2.3* 2.2* 2.0* 1.9*   No results for input(s): "LIPASE", "AMYLASE" in the last 168 hours. Recent Labs  Lab 04/01/24 1330  AMMONIA 24   Diabetic: No results for input(s): "HGBA1C" in the last 72 hours. No results for input(s): "GLUCAP" in the last 168 hours. Cardiac Enzymes: Recent Labs  Lab 04/02/24 0357  CKTOTAL 40   No results for input(s): "PROBNP" in the last 8760 hours. Coagulation Profile: No results for input(s): "INR", "PROTIME" in the last 168 hours. Thyroid  Function Tests: Recent Labs    04/04/24 0830  TSH 5.123*   Lipid Profile: No results for input(s): "CHOL", "HDL", "LDLCALC", "TRIG", "CHOLHDL", "LDLDIRECT" in the last 72 hours. Anemia  Panel: No results for input(s): "VITAMINB12", "FOLATE", "FERRITIN", "TIBC", "IRON", "RETICCTPCT" in the last 72 hours. Urine analysis:    Component Value Date/Time   COLORURINE AMBER (A) 04/01/2024 1715   APPEARANCEUR HAZY (A) 04/01/2024 1715   LABSPEC 1.030 04/01/2024 1715   PHURINE 5.0 04/01/2024 1715   GLUCOSEU NEGATIVE 04/01/2024 1715   GLUCOSEU NEGATIVE 08/15/2016 0929   HGBUR NEGATIVE 04/01/2024 1715   BILIRUBINUR NEGATIVE 04/01/2024 1715   BILIRUBINUR negative 09/08/2016 1412   KETONESUR NEGATIVE 04/01/2024 1715   PROTEINUR 30 (A) 04/01/2024 1715   UROBILINOGEN 0.2 09/08/2016 1412   UROBILINOGEN 0.2 08/15/2016 0929   NITRITE NEGATIVE 04/01/2024 1715   LEUKOCYTESUR MODERATE (A) 04/01/2024 1715   Sepsis Labs: Invalid input(s): "PROCALCITONIN", "LACTICIDVEN"  Microbiology: Recent Results (from the past 240 hours)  Blood culture (routine x 2)     Status: None   Collection Time: 04/01/24  1:15 PM   Specimen: BLOOD  Result Value  Ref Range Status   Specimen Description BLOOD LEFT ANTECUBITAL  Final   Special Requests   Final    BOTTLES DRAWN AEROBIC ONLY Blood Culture results may not be optimal due to an inadequate volume of blood received in culture bottles   Culture   Final    NO GROWTH 5 DAYS Performed at [facility], [address] [place], [place] [address]    Report Status 04/06/2024 FINAL  Final  Blood culture (routine x 2)     Status: None   Collection Time: 04/01/24  1:20 PM   Specimen: BLOOD RIGHT FOREARM  Result Value Ref Range Status   Specimen Description BLOOD RIGHT FOREARM  Final   Special Requests   Final    BOTTLES DRAWN AEROBIC ONLY Blood Culture results may not be optimal due to an inadequate volume of blood received in culture bottles   Culture   Final    NO GROWTH 5 DAYS Performed at [facility], [address] [place], [place] [address]    Report Status 04/06/2024 FINAL  Final  Resp panel by RT-PCR (RSV, Flu A&B, Covid) Anterior  Nasal Swab     Status: None   Collection Time: 04/01/24  1:24 PM   Specimen: Anterior Nasal Swab  Result Value Ref Range Status   SARS Coronavirus 2 by RT PCR NEGATIVE NEGATIVE Final   Influenza A by PCR NEGATIVE NEGATIVE Final   Influenza B by PCR NEGATIVE NEGATIVE Final    Comment: (NOTE) The Xpert Xpress SARS-CoV-2/FLU/RSV plus assay is intended as an aid in the diagnosis of influenza from Nasopharyngeal swab specimens and should not be used as a sole basis for treatment. Nasal washings and aspirates are unacceptable for Xpert Xpress SARS-CoV-2/FLU/RSV testing.  Fact Sheet for Patients: BloggerCourse.com  Fact Sheet for Healthcare Providers: SeriousBroker.it  This test is not yet approved or cleared by the [place]  FDA and has been authorized for detection and/or diagnosis of SARS-CoV-2 by FDA under an Emergency Use Authorization (EUA). This EUA will remain in effect (meaning this test can be used) for the duration of the COVID-19 declaration under Section 564(b)(1) of the Act, 21 U.S.C. section 360bbb-3(b)(1), unless the authorization is terminated or revoked.     Resp Syncytial Virus by PCR NEGATIVE NEGATIVE Final    Comment: (NOTE) Fact Sheet for Patients: BloggerCourse.com  Fact Sheet for Healthcare Providers: SeriousBroker.it  This test is not yet approved or cleared by the [place]  FDA and has been authorized for detection and/or diagnosis of SARS-CoV-2 by FDA under an Emergency Use Authorization (EUA). This EUA will remain in effect (meaning this test can be used) for the duration of the COVID-19 declaration under Section 564(b)(1) of the Act, 21 U.S.C. section 360bbb-3(b)(1), unless the authorization is terminated or revoked.  Performed at [facility], [address], [place], [place] [address]     Radiology Studies: No results  found.     Deauna Yaw T. Rifky Lapre [facility]  If 7PM-7AM, please contact night-coverage [email or web address] 04/06/2024, 1:02 PM

## 2024-04-07 ENCOUNTER — Other Ambulatory Visit (HOSPITAL_COMMUNITY): Payer: Self-pay

## 2024-04-07 DIAGNOSIS — I82412 Acute embolism and thrombosis of left femoral vein: Secondary | ICD-10-CM | POA: Diagnosis not present

## 2024-04-07 DIAGNOSIS — K5900 Constipation, unspecified: Secondary | ICD-10-CM | POA: Diagnosis not present

## 2024-04-07 DIAGNOSIS — I10 Essential (primary) hypertension: Secondary | ICD-10-CM | POA: Diagnosis not present

## 2024-04-07 DIAGNOSIS — K219 Gastro-esophageal reflux disease without esophagitis: Secondary | ICD-10-CM | POA: Diagnosis not present

## 2024-04-07 LAB — COMPREHENSIVE METABOLIC PANEL WITH GFR
ALT: 114 U/L — ABNORMAL HIGH (ref 0–44)
AST: 90 U/L — ABNORMAL HIGH (ref 15–41)
Albumin: 1.9 g/dL — ABNORMAL LOW (ref 3.5–5.0)
Alkaline Phosphatase: 255 U/L — ABNORMAL HIGH (ref 38–126)
Anion gap: 9 (ref 5–15)
BUN: 11 mg/dL (ref 8–23)
CO2: 21 mmol/L — ABNORMAL LOW (ref 22–32)
Calcium: 8.5 mg/dL — ABNORMAL LOW (ref 8.9–10.3)
Chloride: 104 mmol/L (ref 98–111)
Creatinine, Ser: 0.54 mg/dL (ref 0.44–1.00)
GFR, Estimated: >60 mL/min (ref >60)
Glucose, Bld: 116 mg/dL — ABNORMAL HIGH (ref 70–99)
Potassium: 3.8 mmol/L (ref 3.5–5.1)
Sodium: 134 mmol/L — ABNORMAL LOW (ref 135–145)
Total Bilirubin: 0.7 mg/dL (ref 0.0–1.2)
Total Protein: 5.8 g/dL — ABNORMAL LOW (ref 6.5–8.1)

## 2024-04-07 LAB — CBC
HCT: 30.3 % — ABNORMAL LOW (ref 36.0–46.0)
Hemoglobin: 10.2 g/dL — ABNORMAL LOW (ref 12.0–15.0)
MCH: 30.1 pg (ref 26.0–34.0)
MCHC: 33.7 g/dL (ref 30.0–36.0)
MCV: 89.4 fL (ref 80.0–100.0)
Platelets: 368 10*3/uL (ref 150–400)
RBC: 3.39 MIL/uL — ABNORMAL LOW (ref 3.87–5.11)
RDW: 12.3 % (ref 11.5–15.5)
WBC: 6.5 10*3/uL (ref 4.0–10.5)
nRBC: 0 % (ref 0.0–0.2)

## 2024-04-07 LAB — PHOSPHORUS: Phosphorus: 3.3 mg/dL (ref 2.5–4.6)

## 2024-04-07 LAB — MAGNESIUM: Magnesium: 1.8 mg/dL (ref 1.7–2.4)

## 2024-04-07 MED ORDER — SENNOSIDES-DOCUSATE SODIUM 8.6-50 MG PO TABS: 1.0000 | ORAL_TABLET | Freq: Two times a day (BID) | ORAL | Status: AC | PRN

## 2024-04-07 MED ORDER — APIXABAN 5 MG PO TABS
5.0000 mg | ORAL_TABLET | Freq: Two times a day (BID) | ORAL | 1 refills | Status: DC
  Filled 2024-04-07: qty 60, 30d supply, fill #0

## 2024-04-07 NOTE — Progress Notes (Signed)
 Mobility Specialist Progress Note:    04/07/24 1129  Mobility  Activity Stood at bedside;Transferred from bed to chair;Dangled on edge of bed  Level of Assistance Maximum assist, patient does 25-49% (+3)  Teaching laboratory technician;Other (Comment) (HHA)  Activity Response Tolerated well  Mobility Referral Yes  Mobility visit 1 Mobility  Mobility Specialist Start Time (ACUTE ONLY) 1100  Mobility Specialist Stop Time (ACUTE ONLY) 1129  Mobility Specialist Time Calculation (min) (ACUTE ONLY) 29 min   Assisted nursing with transferring pt B>WC via MaxA+3. Transferred pt down to valet for d/c and assisted pt into vehicle with MaxA+3 and HHA. Left pt with all needs met.   [name] Mobility Specialist Please contact via Special educational needs teacher or  Rehab office at [phone]

## 2024-04-07 NOTE — Plan of Care (Signed)

## 2024-04-07 NOTE — TOC Transition Note (Addendum)
 Transition of Care [facility]) - Discharge Note   Patient Details  Name: [name] MRN: [id number] Date of Birth: August 02, 1941  [facility] CM/SW Contact:  Alisa App, RN Phone Number: 04/07/2024, 10:03 AM   Clinical Narrative:    Patient will DC to: home Anticipated DC date: 04/07/2024 Family notified: yes Transport by: car    - s/p mechanical thrombectomy on 4/21   Per MD patient ready for DC today. RN, patient, patient's  husband notified of DC. Pt/ husband agreeable to home health services, PT/OT/NA. Pt/husband without a provider preference. Referral made with [facility] and accepted, SOC with 48 hrs post d/c. Pt without DME need. Pt without RX med concerns. Husband to provide transportation to home. Post hospital f/u noted on AVS.  RNCM will sign off for now as intervention is no longer needed. Please consult us  again if new needs arise.   Joeli Fenner Spouse Emergency Contact [phone]     Barriers to Discharge: Continued Medical Work up   Patient Goals and CMS Choice Patient states their goals for this hospitalization and ongoing recovery are:: to go home          Discharge Placement                       Discharge Plan and Services Additional resources added to the After Visit Summary for     Discharge Planning Services: CM Consult                                 Social Drivers of Health (SDOH) Interventions SDOH Screenings   Food Insecurity: Patient Declined (04/01/2024)  Housing: Patient Declined (04/01/2024)  Transportation Needs: Patient Declined (04/01/2024)  Utilities: Patient Declined (04/01/2024)  Social Connections: Patient Declined (04/01/2024)  Tobacco Use: Medium Risk (04/01/2024)     Readmission Risk Interventions     No data to display

## 2024-04-07 NOTE — Progress Notes (Signed)
 Patient given discharge instructions medication list and follow up appointments. IV dcd and monitor, Husband aware of discharge orders and Is on his way back to the hospital. Medications sent to [facility] pharmacy. Will discharge home as ordered. Claudy Abdallah Jessup RN

## 2024-04-07 NOTE — Discharge Summary (Signed)
 Physician Discharge Summary  [name] JXB:[id number] DOB: 05-04-1941 DOA: 04/01/2024  PCP: Alejandro Hurt, FNP  Admit date: 04/01/2024 Discharge date: 04/07/24  Admitted From: Home Disposition: Home Recommendations for Outpatient Follow-up:  Follow up with PCP in 1 to 2 weeks Outpatient follow-up with vascular surgery Check CMP and CBC at follow-up Please follow up on the following pending results: None  Home Health: [facility] PT/OT Equipment/Devices: Wheelchair  Discharge Condition: Stable CODE STATUS: Full code  Follow-up Information     [facility] Follow up in 1 month(s).   Why: The office will call you with your appointment Contact information: [address] [place]  [address] [phone]        Alejandro Hurt, FNP. Schedule an appointment as soon as possible for a visit in 1 week(s).   Specialty: Family Medicine Contact information: [address] [place] [address] [phone]         [facility] Follow up.   Why: Home health services will be provided by [facility] , start of care within 48 hours post discharge. Contact information: [address] [place] [address] [phone]                 Hospital course 83 year old F with PMH of cognitive impairment, HTN and GERD presenting with progressive left leg swelling and pain, and admitted with extensive left lower extremity DVT.  Patient was started on IV heparin .  Vascular surgery consulted.  Plan for mechanical thrombectomy on 04/04/2024.  CT angio chest negative for PE.   Patient underwent mechanical thrombectomy by Dr. Susi Eric on 04/04/2024.  No postop complication.  Transitioned to Eliquis  on 04/06/2024 and cleared for discharge by vascular surgery.  HH PT/OT and wheelchair ordered as recommended by therapy.  See individual problem list below for more.   Problems addressed during this  hospitalization Extensive left-sided DVT involving the common iliac vein, external and internal iliac veins in the abdomen extending down into the left leg from femoral vein down to the posterior tibial veins.  Likely due to immobility.  CT angio chest negative for PE. -S/p mechanical thrombectomy by Dr. Susi Eric on 4/21 -Transitioned to Eliquis  per vascular surgery on 4/23 -Continue TED hose and PT/OT -Check CBC at follow-up - Outpatient follow-up with vascular surgery as above   Dementia without behavioral disturbance: Not formally diagnosed.  Awake and alert.  Oriented to self, place and person but not time. -Reorientation and delirium precaution   Elevated LFTs: Could be due to the above.  She is also on a statin and Tylenol  at home.  Acute hepatitis and Tylenol  level within normal.  Acute hepatitis panel negative.  RUQ US  negative.  Improved after mechanical thrombectomy -Continue holding statin - Check CMP in 1 to 2 weeks   Essential hypertension-continue home Toprol -XL   Hyponatremia: Due to Effexor ?  Urine sodium only 13.  TSH 5.1.  Cortisol normal.  Improved.   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 - Continue home Effexor .   GERD-continue home PPI   Moderate malnutrition Nutrition Problem: Moderate Malnutrition Etiology: chronic illness Signs/Symptoms: moderate fat depletion, moderate muscle depletion Interventions: Refer to RD note for recommendations     Time spent 35 minutes  Vital signs Vitals:   04/07/24 0800 04/07/24 0907 04/07/24 1000 04/07/24 1116  BP: (!) 128/55 (!) 121/55 (!) 123/56 132/70  Pulse: 92 97 96   Temp:   98.3 F (36.8 C)  Resp: 14 17 16    Height:      Weight:      SpO2:   92%   TempSrc:   Oral   BMI (Calculated):         Discharge exam  GENERAL: No apparent distress.  Nontoxic. HEENT: MMM.  Vision and hearing grossly intact.  NECK: Supple.  No apparent JVD.  RESP:  No IWOB.  Fair aeration bilaterally. CVS:  RRR. Heart sounds normal.   ABD/GI/GU: BS+. Abd soft, NTND.  MSK/EXT:  Moves extremities.  LLE in TED hose. SKIN: no apparent skin lesion or wound NEURO: Awake and alert.  Oriented fairly.  Follows commands.  No apparent focal neuro deficit. PSYCH: Calm. Normal affect.   Discharge Instructions Discharge Instructions     Diet general   Complete by: As directed    Discharge instructions   Complete by: As directed    It has been a pleasure taking care of you!  You were hospitalized due to deep venous thrombosis (blood clot in the left leg) for which you have been treated surgically medically.  We are discharging you on Eliquis  (a blood thinner).  It is very important that you take this medication as prescribed.  Follow-up with your primary care doctor in 1 to 2 weeks.  Follow-up with vascular surgery per their recommendation.   Take care,   Increase activity slowly   Complete by: As directed       Allergies as of 04/07/2024       Reactions   Levaquin  [levofloxacin  In D5w] Nausea Only   Dizziness and tremulousness        Medication List     PAUSE taking these medications    rosuvastatin  10 MG tablet Wait to take this until: Apr 23, 2024 Commonly known as: CRESTOR  Take 10 mg by mouth daily.       STOP taking these medications    ibuprofen  400 MG tablet Commonly known as: ADVIL        TAKE these medications    acetaminophen  500 MG tablet Commonly known as: TYLENOL  Take 1 tablet (500 mg total) by mouth every 8 (eight) hours as needed.   Eliquis  5 MG Tabs tablet Generic drug: apixaban  Take 1 tablet (5 mg total) by mouth 2 (two) times daily.   lactose free nutrition Liqd Take 237 mLs by mouth 3 (three) times daily between meals.   metoprolol  succinate 50 MG 24 hr tablet Commonly known as: TOPROL -XL Take 50 mg by mouth daily.   omeprazole 40 MG capsule Commonly known as: PRILOSEC Take 1 capsule by mouth daily.   Pedialyte Soln Take 240 mLs by mouth daily.   senna-docusate  8.6-50 MG tablet Commonly known as: Senokot-S Take 1 tablet by mouth 2 (two) times daily between meals as needed for mild constipation.   venlafaxine  XR 75 MG 24 hr capsule Commonly known as: EFFEXOR -XR TAKE ONE CAPSULE BY MOUTH ONCE DAILY WITH  BREAKFAST        Consultations: Vascular surgery  Procedures/Studies: Ultrasound-guided access of left popliteal vein Intravascular ultrasound of left popliteal, femoral, common femoral veins Intravascular ultrasound of the left external iliac, common iliac vein and IVC Mechanical thrombectomy of left common iliac, external iliac, common femoral, femoral and popliteal veins Left leg venogram 43 minutes moderate sedation with fentanyl  and Versed    US  Abdomen Limited RUQ (LIVER/GB) Result Date: 04/05/2024 CLINICAL DATA:  Elevated LFTs. EXAM: ULTRASOUND ABDOMEN LIMITED RIGHT UPPER QUADRANT COMPARISON:  None Available. FINDINGS: Gallbladder: No gallstones or  wall thickening visualized. No sonographic Murphy sign noted by sonographer. Common bile duct: Diameter: 4.6 mm. Liver: No focal lesion identified. Within normal limits in parenchymal echogenicity. Portal vein is patent on color Doppler imaging with normal direction of blood flow towards the liver. Other: None. IMPRESSION: Normal right upper quadrant ultrasound. Electronically Signed   By: Kimberley Penman M.D.   On: 04/05/2024 05:31   PERIPHERAL VASCULAR CATHETERIZATION Result Date: 04/04/2024 Images from the original result were not included. Patient name: [name] MRN: [id number] DOB: Mar 03, 1941 Sex: female 04/01/2024 - 04/04/2024 Pre-operative Diagnosis: Left iliofemoral DVT Post-operative diagnosis:  Same Surgeon:  Philipp Brawn, MD Procedure Performed: Ultrasound-guided access of left popliteal vein Intravascular ultrasound of left popliteal, femoral, common femoral veins Intravascular ultrasound of the left external iliac, common iliac vein and IVC Mechanical thrombectomy of left common  iliac, external iliac, common femoral, femoral and popliteal veins Left leg venogram 43 minutes moderate sedation with fentanyl  and Versed  Indications: Ms. Blowe is a 83 year old female who presented to the emergency department over the weekend with 1 week history of encephalopathy, poor p.o. intake and lethargy.  She was also noted to have a significantly swollen left lower extremity which was reported started over the last few days.  She was found to have an extensive left lower extremity DVT with extension into the left common iliac vein.  Mechanical thrombectomy was offered, risk and benefits were reviewed, the d husband expressed understanding and elected to proceed. Findings: Thrombosis of the left common iliac, external iliac, common femoral, femoral and popliteal veins Acute thrombus retrieved from the left common iliac, external iliac, common femoral, femoral and popliteal veins. No compression of the left common iliac vein.  No thrombus noted in the IVC. Brisk flow through the left leg and into the IVC on completion  Procedure:  The patient was identified in the holding area and taken to the cath lab  The patient was then placed supine on the table and prepped and draped in the usual sterile fashion.  A time out was called.  Ultrasound was used to evaluate the left popliteal vein.  It was occluded and noncompressible.  This was accessed under ultrasound guidance and the micropuncture sheath was placed.  This was then upsized to an 8 [place] sheath over a glide advantage wire.  Intravascular ultrasound was used which demonstrated the above findings.  The patient was then systemically heparinized with 5000 units heparin  and the 8 French sheath was exchanged for the 13 [place] Inari sheath.  The catheter was advanced into the IVC, basket released and gently exchanged back into the sheath.  A total of 4 passes were made in 90 degree obliques.  Each pass retrieved acute thrombus.  After the fourth pass IVUS  was readvanced which demonstrated a small femoral vein but a widely patent left common femoral, external iliac and common iliac vein.  A completion venogram was then obtained which demonstrated brisk flow and wide patency of the treated veins.  The wire and sheath were removed and a figure-of-eight suture was placed at the access site with Monocryl suture. Contrast: 15cc Sedation: 43 minutes Impression: Successful thrombectomy of the left lower extremity with retrieval of acute thrombus and brisk flow from the left popliteal into the IVC. Philipp Brawn MD [facility] Office: [phone]  VAS US  LOWER EXTREMITY VENOUS (DVT) (7a-7p) Result Date: 04/03/2024  Lower Venous DVT Study Patient Name:  DILCIA RYBARCZYK  Date of Exam:  04/01/2024 Medical Rec #: [id number]         Accession #:    [id number] Date of Birth: [birth year]          Patient Gender: F Patient Age:   51 years Exam Location:  [facility] Procedure:      VAS US  LOWER EXTREMITY VENOUS (DVT) Referring Phys: Veryl Gottron GROCE --------------------------------------------------------------------------------  Indications: Pain, Swelling, and Edema.  Risk Factors: None identified. Limitations: Poor ultrasound/tissue interface and patient positioning. Comparison Study: None. Performing Technologist: Estanislao Heimlich  Examination Guidelines: A complete evaluation includes B-mode imaging, spectral Doppler, color Doppler, and power Doppler as needed of all accessible portions of each vessel. Bilateral testing is considered an integral part of a complete examination. Limited examinations for reoccurring indications may be performed as noted. The reflux portion of the exam is performed with the patient in reverse Trendelenburg.  +-----+---------------+---------+-----------+----------+--------------+ RIGHTCompressibilityPhasicitySpontaneityPropertiesThrombus Aging  +-----+---------------+---------+-----------+----------+--------------+ CFV  Full           Yes      Yes                                 +-----+---------------+---------+-----------+----------+--------------+   +---------+---------------+---------+-----------+---------------+-------------+ LEFT     CompressibilityPhasicitySpontaneityProperties     Thrombus                                                                 Aging         +---------+---------------+---------+-----------+---------------+-------------+ CFV      Partial        Yes      Yes        brightly       Acute                                                     echogenic                    +---------+---------------+---------+-----------+---------------+-------------+ SFJ      Partial                 Yes        brightly       Acute                                                     echogenic                    +---------+---------------+---------+-----------+---------------+-------------+ FV Prox  None           No       No         rigid          Acute  w/compression                +---------+---------------+---------+-----------+---------------+-------------+ FV Mid   None           No       No         rigid          Acute                                                     w/compression                +---------+---------------+---------+-----------+---------------+-------------+ FV DistalNone           No       No         rigid          Acute                                                     w/compression                +---------+---------------+---------+-----------+---------------+-------------+ PFV      None           No       No         rigid          Acute                                                     w/compression                 +---------+---------------+---------+-----------+---------------+-------------+ POP      None           No       No         brightly       Acute                                                     echogenic                    +---------+---------------+---------+-----------+---------------+-------------+ PTV      None           No       No         rigid          Acute                                                     w/compression                +---------+---------------+---------+-----------+---------------+-------------+ PERO     Partial        Yes      Yes  rigid          Acute                                                     w/compression                +---------+---------------+---------+-----------+---------------+-------------+ Gastroc  None           No       No         dilated        Acute         +---------+---------------+---------+-----------+---------------+-------------+ EIV                     No       No         dilated        Acute         +---------+---------------+---------+-----------+---------------+-------------+     Summary: RIGHT: - No evidence of common femoral vein obstruction.   LEFT: - Findings consistent with acute deep vein thrombosis involving the left common femoral vein, SF junction, left femoral vein, left proximal profunda vein, left popliteal vein, left posterior tibial veins, left peroneal veins, and EIV. Findings consistent with acute intramuscular thrombosis involving the left gastrocnemius veins. - No cystic structure found in the popliteal fossa.  *See table(s) above for measurements and observations. Electronically signed by Runell Countryman on 04/03/2024 at 3:46:52 PM.    Final    CT Angio Chest PE W and/or Wo Contrast Result Date: 04/01/2024 CLINICAL DATA:  Elevated LFTs.  Tachycardia.  Rule out PE. EXAM: CT ANGIOGRAPHY CHEST CT ABDOMEN AND PELVIS WITH CONTRAST TECHNIQUE: Multidetector CT imaging of the  chest was performed using the standard protocol during bolus administration of intravenous contrast. Multiplanar CT image reconstructions and MIPs were obtained to evaluate the vascular anatomy. Multidetector CT imaging of the abdomen and pelvis was performed using the standard protocol during bolus administration of intravenous contrast. RADIATION DOSE REDUCTION: This exam was performed according to the departmental dose-optimization program which includes automated exposure control, adjustment of the mA and/or kV according to patient size and/or use of iterative reconstruction technique. CONTRAST:  75mL OMNIPAQUE  IOHEXOL  350 MG/ML SOLN COMPARISON:  Chest radiograph earlier today and CT abdomen pelvis 03/09/2023 FINDINGS: CTA CHEST FINDINGS Cardiovascular: Normal heart size. No pericardial effusion. Negative for acute pulmonary embolism. Normal caliber thoracic aorta. Mediastinum/Nodes: Trachea is unremarkable. Small hiatal hernia. No thoracic adenopathy. Lungs/Pleura: Right lower lobe atelectasis. The lungs are otherwise clear. No pleural effusion or pneumothorax. Musculoskeletal: No acute fracture.  Bilateral breast implants. Review of the MIP images confirms the above findings. CT ABDOMEN and PELVIS FINDINGS Hepatobiliary: Respiratory motion obscures detail in the upper abdomen. No acute abnormality. Pancreas: Unremarkable. Spleen: Unremarkable. Adrenals/Urinary Tract: Normal adrenal glands. No urinary calculi or hydronephrosis. Unremarkable bladder. Stomach/Bowel: No bowel obstruction or bowel wall thickening. Colonic diverticulosis without diverticulitis. Normal appendix. Vascular/Lymphatic: Mild enlargement of an hypoattenuation within the left common iliac common internal and external iliacs and left common femoral vein. The presumed thrombus extends to the confluence with the right common iliac vein. No evidence of filling defect in the IVC. Adjacent stranding and edema. Reproductive: Hysterectomy.  No  adnexal mass. Other: Small volume free fluid in the pelvis. Extensive subcutaneous and intramuscular edema in the visualized left thigh. Musculoskeletal: No acute  fracture. Review of the MIP images confirms the above findings. IMPRESSION: 1. Negative for acute pulmonary embolism. 2. Acute occlusive deep venous thrombosis involving the left common iliac, common internal and external iliacs, and left common femoral vein. The presumed thrombus extends to the confluence with the right common iliac vein. No evidence of filling defect in the IVC. 3. Extensive subcutaneous and intramuscular edema in the visualized left thigh. Critical Value/emergent results were called by telephone at the time of interpretation on 04/01/2024 at 8:21 pm to provider Dr. Annabell Key, who verbally acknowledged these results. Electronically Signed   By: Rozell Cornet M.D.   On: 04/01/2024 20:36   CT ABDOMEN PELVIS W CONTRAST Result Date: 04/01/2024 CLINICAL DATA:  Elevated LFTs.  Tachycardia.  Rule out PE. EXAM: CT ANGIOGRAPHY CHEST CT ABDOMEN AND PELVIS WITH CONTRAST TECHNIQUE: Multidetector CT imaging of the chest was performed using the standard protocol during bolus administration of intravenous contrast. Multiplanar CT image reconstructions and MIPs were obtained to evaluate the vascular anatomy. Multidetector CT imaging of the abdomen and pelvis was performed using the standard protocol during bolus administration of intravenous contrast. RADIATION DOSE REDUCTION: This exam was performed according to the departmental dose-optimization program which includes automated exposure control, adjustment of the mA and/or kV according to patient size and/or use of iterative reconstruction technique. CONTRAST:  75mL OMNIPAQUE  IOHEXOL  350 MG/ML SOLN COMPARISON:  Chest radiograph earlier today and CT abdomen pelvis 03/09/2023 FINDINGS: CTA CHEST FINDINGS Cardiovascular: Normal heart size. No pericardial effusion. Negative for acute pulmonary embolism.  Normal caliber thoracic aorta. Mediastinum/Nodes: Trachea is unremarkable. Small hiatal hernia. No thoracic adenopathy. Lungs/Pleura: Right lower lobe atelectasis. The lungs are otherwise clear. No pleural effusion or pneumothorax. Musculoskeletal: No acute fracture.  Bilateral breast implants. Review of the MIP images confirms the above findings. CT ABDOMEN and PELVIS FINDINGS Hepatobiliary: Respiratory motion obscures detail in the upper abdomen. No acute abnormality. Pancreas: Unremarkable. Spleen: Unremarkable. Adrenals/Urinary Tract: Normal adrenal glands. No urinary calculi or hydronephrosis. Unremarkable bladder. Stomach/Bowel: No bowel obstruction or bowel wall thickening. Colonic diverticulosis without diverticulitis. Normal appendix. Vascular/Lymphatic: Mild enlargement of an hypoattenuation within the left common iliac common internal and external iliacs and left common femoral vein. The presumed thrombus extends to the confluence with the right common iliac vein. No evidence of filling defect in the IVC. Adjacent stranding and edema. Reproductive: Hysterectomy.  No adnexal mass. Other: Small volume free fluid in the pelvis. Extensive subcutaneous and intramuscular edema in the visualized left thigh. Musculoskeletal: No acute fracture. Review of the MIP images confirms the above findings. IMPRESSION: 1. Negative for acute pulmonary embolism. 2. Acute occlusive deep venous thrombosis involving the left common iliac, common internal and external iliacs, and left common femoral vein. The presumed thrombus extends to the confluence with the right common iliac vein. No evidence of filling defect in the IVC. 3. Extensive subcutaneous and intramuscular edema in the visualized left thigh. Critical Value/emergent results were called by telephone at the time of interpretation on 04/01/2024 at 8:21 pm to provider Dr. Annabell Key, who verbally acknowledged these results. Electronically Signed   By: Rozell Cornet M.D.    On: 04/01/2024 20:36   DG Chest Portable 1 View Result Date: 04/01/2024 CLINICAL DATA:  Altered mental status, sepsis EXAM: PORTABLE CHEST 1 VIEW COMPARISON:  March 21, 2016 FINDINGS: No focal airspace consolidation, pleural effusion, or pneumothorax. No cardiomegaly. Tortuous aorta with aortic atherosclerosis. No acute fracture or destructive lesions. Multilevel thoracic osteophytosis. Bilateral breast implants. IMPRESSION: No acute cardiopulmonary abnormality.  Electronically Signed   By: Rance Burrows M.D.   On: 04/01/2024 16:23   CT Head Wo Contrast Result Date: 04/01/2024 CLINICAL DATA:  Mental status change, unknown cause. EXAM: CT HEAD WITHOUT CONTRAST TECHNIQUE: Contiguous axial images were obtained from the base of the skull through the vertex without intravenous contrast. RADIATION DOSE REDUCTION: This exam was performed according to the departmental dose-optimization program which includes automated exposure control, adjustment of the mA and/or kV according to patient size and/or use of iterative reconstruction technique. COMPARISON:  Head CT 10/09/2021 and MRI 06/29/2023 FINDINGS: Brain: There is no evidence of an acute infarct, intracranial hemorrhage, mass, midline shift, or extra-axial fluid collection. Confluent bilateral cerebral white matter hypodensities have mildly progressed from the 2022 head CT and are nonspecific but compatible with extensive chronic small vessel ischemic disease. There is moderate cerebral atrophy. 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 Vascular: No hyperdense vessel or unexpected calcification. Skull: No acute fracture or suspicious lesion. Sinuses/Orbits: Minimal mucosal thickening in the paranasal sinuses. No significant mastoid fluid. Bilateral cataract extraction. Other: None. IMPRESSION: 1. No evidence of acute intracranial abnormality. 2. Extensive chronic small vessel ischemic disease. Electronically Signed   By: Aundra Lee M.D.   On:  04/01/2024 15:58       The results of significant diagnostics from this hospitalization (including imaging, microbiology, ancillary and laboratory) are listed below for reference.     Microbiology: Recent Results (from the past 240 hours)  Blood culture (routine x 2)     Status: None   Collection Time: 04/01/24  1:15 PM   Specimen: BLOOD  Result Value Ref Range Status   Specimen Description BLOOD LEFT ANTECUBITAL  Final   Special Requests   Final    BOTTLES DRAWN AEROBIC ONLY Blood Culture results may not be optimal due to an inadequate volume of blood received in culture bottles   Culture   Final    NO GROWTH 5 DAYS Performed at [facility], [address], [place], [place] [address]    Report Status 04/06/2024 FINAL  Final  Blood culture (routine x 2)     Status: None   Collection Time: 04/01/24  1:20 PM   Specimen: BLOOD RIGHT FOREARM  Result Value Ref Range Status   Specimen Description BLOOD RIGHT FOREARM  Final   Special Requests   Final    BOTTLES DRAWN AEROBIC ONLY Blood Culture results may not be optimal due to an inadequate volume of blood received in culture bottles   Culture   Final    NO GROWTH 5 DAYS Performed at [facility], [address], [place], [place] [address]    Report Status 04/06/2024 FINAL  Final  Resp panel by RT-PCR (RSV, Flu A&B, Covid) Anterior Nasal Swab     Status: None   Collection Time: 04/01/24  1:24 PM   Specimen: Anterior Nasal Swab  Result Value Ref Range Status   SARS Coronavirus 2 by RT PCR NEGATIVE NEGATIVE Final   Influenza A by PCR NEGATIVE NEGATIVE Final   Influenza B by PCR NEGATIVE NEGATIVE Final    Comment: (NOTE) The Xpert Xpress SARS-CoV-2/FLU/RSV plus assay is intended as an aid in the diagnosis of influenza from Nasopharyngeal swab specimens and should not be used as a sole basis for treatment. Nasal washings and aspirates are unacceptable for Xpert Xpress SARS-CoV-2/FLU/RSV testing.  Fact Sheet for  Patients: BloggerCourse.com  Fact Sheet for Healthcare Providers: SeriousBroker.it  This test  is not yet approved or cleared by the [place]  FDA and has been authorized for detection and/or diagnosis of SARS-CoV-2 by FDA under an Emergency Use Authorization (EUA). This EUA will remain in effect (meaning this test can be used) for the duration of the COVID-19 declaration under Section 564(b)(1) of the Act, 21 U.S.C. section 360bbb-3(b)(1), unless the authorization is terminated or revoked.     Resp Syncytial Virus by PCR NEGATIVE NEGATIVE Final    Comment: (NOTE) Fact Sheet for Patients: BloggerCourse.com  Fact Sheet for Healthcare Providers: SeriousBroker.it  This test is not yet approved or cleared by the [place]  FDA and has been authorized for detection and/or diagnosis of SARS-CoV-2 by FDA under an Emergency Use Authorization (EUA). This EUA will remain in effect (meaning this test can be used) for the duration of the COVID-19 declaration under Section 564(b)(1) of the Act, 21 U.S.C. section 360bbb-3(b)(1), unless the authorization is terminated or revoked.  Performed at [facility], [address] [place], [place] [address]      Labs:  CBC: Recent Labs  Lab 04/03/24 [phone] 04/04/24 0427 04/05/24 0814 04/06/24 0442 04/07/24 0332  WBC 7.6 7.8 6.9 6.4 6.5  HGB 11.3* 10.9* 10.8* 10.6* 10.2*  HCT 33.1* 33.0* 31.9* 32.1* 30.3*  MCV 89.7 89.9 90.4 89.9 89.4  PLT 252 298 297 356 368   BMP &GFR Recent Labs  Lab 04/02/24 0357 04/03/24 0520 04/04/24 0830 04/05/24 0814 04/06/24 0442 04/07/24 0332  NA 132* 130* 128* 132* 137 134*  K 4.7 4.2 4.3 3.6 3.8 3.8  CL 99 97* 99 102 106 104  CO2 20* 21* 18* 19* 21* 21*  GLUCOSE 111* 102* 104* 116* 113* 116*  BUN 25* 22 18 12 9 11   CREATININE 0.83 0.78 0.71 0.55 0.63 0.54  CALCIUM  9.3 8.5* 8.6* 8.5* 8.6*  8.5*  MG 2.3  --  2.1 1.9 1.9 1.8  PHOS  --   --   --  2.8  --  3.3   Estimated Creatinine Clearance: 48.8 mL/min (by C-G formula based on SCr of 0.54 mg/dL). Liver & Pancreas: Recent Labs  Lab 04/03/24 0520 04/04/24 0830 04/05/24 0814 04/06/24 0442 04/07/24 0332  AST 97* 118* 75* 77* 90*  ALT 127* 144* 108* 108* 114*  ALKPHOS 243* 288* 273* 237* 255*  BILITOT 1.0 1.2 1.1 0.6 0.7  PROT 6.8 6.6 6.0* 6.0* 5.8*  ALBUMIN 2.3* 2.2* 2.0* 1.9* 1.9*   No results for input(s): "LIPASE", "AMYLASE" in the last 168 hours. Recent Labs  Lab 04/01/24 1330  AMMONIA 24   Diabetic: No results for input(s): "HGBA1C" in the last 72 hours. No results for input(s): "GLUCAP" in the last 168 hours. Cardiac Enzymes: Recent Labs  Lab 04/02/24 0357  CKTOTAL 40   No results for input(s): "PROBNP" in the last 8760 hours. Coagulation Profile: No results for input(s): "INR", "PROTIME" in the last 168 hours. Thyroid  Function Tests: No results for input(s): "TSH", "T4TOTAL", "FREET4", "T3FREE", "THYROIDAB" in the last 72 hours. Lipid Profile: No results for input(s): "CHOL", "HDL", "LDLCALC", "TRIG", "CHOLHDL", "LDLDIRECT" in the last 72 hours. Anemia Panel: No results for input(s): "VITAMINB12", "FOLATE", "FERRITIN", "TIBC", "IRON", "RETICCTPCT" in the last 72 hours. Urine analysis:    Component Value Date/Time   COLORURINE AMBER (A) 04/01/2024 1715   APPEARANCEUR HAZY (A) 04/01/2024 1715   LABSPEC 1.030 04/01/2024 1715   PHURINE 5.0 04/01/2024 1715   GLUCOSEU NEGATIVE 04/01/2024 1715   GLUCOSEU NEGATIVE 08/15/2016 0929   HGBUR  NEGATIVE 04/01/2024 1715   BILIRUBINUR NEGATIVE 04/01/2024 1715   BILIRUBINUR negative 09/08/2016 1412   KETONESUR NEGATIVE 04/01/2024 1715   PROTEINUR 30 (A) 04/01/2024 1715   UROBILINOGEN 0.2 09/08/2016 1412   UROBILINOGEN 0.2 08/15/2016 0929   NITRITE NEGATIVE 04/01/2024 1715   LEUKOCYTESUR MODERATE (A) 04/01/2024 1715   Sepsis Labs: Invalid input(s):  "PROCALCITONIN", "LACTICIDVEN"   SIGNED:  Theadore Finger, MD  [facility] 04/07/2024, 5:09 PM

## 2024-04-14 DIAGNOSIS — M810 Age-related osteoporosis without current pathological fracture: Secondary | ICD-10-CM | POA: Diagnosis not present

## 2024-04-14 DIAGNOSIS — Z48812 Encounter for surgical aftercare following surgery on the circulatory system: Secondary | ICD-10-CM | POA: Diagnosis not present

## 2024-04-14 DIAGNOSIS — K5790 Diverticulosis of intestine, part unspecified, without perforation or abscess without bleeding: Secondary | ICD-10-CM | POA: Diagnosis not present

## 2024-04-14 DIAGNOSIS — F0394 Unspecified dementia, unspecified severity, with anxiety: Secondary | ICD-10-CM | POA: Diagnosis not present

## 2024-04-14 DIAGNOSIS — E782 Mixed hyperlipidemia: Secondary | ICD-10-CM | POA: Diagnosis not present

## 2024-04-14 DIAGNOSIS — G43909 Migraine, unspecified, not intractable, without status migrainosus: Secondary | ICD-10-CM | POA: Diagnosis not present

## 2024-04-14 DIAGNOSIS — E785 Hyperlipidemia, unspecified: Secondary | ICD-10-CM | POA: Diagnosis not present

## 2024-04-14 DIAGNOSIS — M19041 Primary osteoarthritis, right hand: Secondary | ICD-10-CM | POA: Diagnosis not present

## 2024-04-14 DIAGNOSIS — Z09 Encounter for follow-up examination after completed treatment for conditions other than malignant neoplasm: Secondary | ICD-10-CM | POA: Diagnosis not present

## 2024-04-14 DIAGNOSIS — M5412 Radiculopathy, cervical region: Secondary | ICD-10-CM | POA: Diagnosis not present

## 2024-04-14 DIAGNOSIS — I10 Essential (primary) hypertension: Secondary | ICD-10-CM | POA: Diagnosis not present

## 2024-04-14 DIAGNOSIS — K219 Gastro-esophageal reflux disease without esophagitis: Secondary | ICD-10-CM | POA: Diagnosis not present

## 2024-04-18 ENCOUNTER — Telehealth: Payer: Self-pay

## 2024-04-18 DIAGNOSIS — G43909 Migraine, unspecified, not intractable, without status migrainosus: Secondary | ICD-10-CM | POA: Diagnosis not present

## 2024-04-18 DIAGNOSIS — M5412 Radiculopathy, cervical region: Secondary | ICD-10-CM | POA: Diagnosis not present

## 2024-04-18 DIAGNOSIS — K219 Gastro-esophageal reflux disease without esophagitis: Secondary | ICD-10-CM | POA: Diagnosis not present

## 2024-04-18 DIAGNOSIS — M19041 Primary osteoarthritis, right hand: Secondary | ICD-10-CM | POA: Diagnosis not present

## 2024-04-18 DIAGNOSIS — Z48812 Encounter for surgical aftercare following surgery on the circulatory system: Secondary | ICD-10-CM | POA: Diagnosis not present

## 2024-04-18 DIAGNOSIS — E785 Hyperlipidemia, unspecified: Secondary | ICD-10-CM | POA: Diagnosis not present

## 2024-04-18 DIAGNOSIS — F0394 Unspecified dementia, unspecified severity, with anxiety: Secondary | ICD-10-CM | POA: Diagnosis not present

## 2024-04-18 DIAGNOSIS — M810 Age-related osteoporosis without current pathological fracture: Secondary | ICD-10-CM | POA: Diagnosis not present

## 2024-04-18 DIAGNOSIS — I10 Essential (primary) hypertension: Secondary | ICD-10-CM | POA: Diagnosis not present

## 2024-04-18 DIAGNOSIS — K5790 Diverticulosis of intestine, part unspecified, without perforation or abscess without bleeding: Secondary | ICD-10-CM | POA: Diagnosis not present

## 2024-04-18 NOTE — Telephone Encounter (Signed)
 Home Health: -Media Spikes, PT w/ [facility]  -verbal order given for PT evaluation, two times per week for 4 weeks and one time per week for 2 weeks

## 2024-04-18 NOTE — Telephone Encounter (Signed)
 Home Health: -Will, OT w/. [facility] called and verbal order given: -occupational therapy evaluation and 2 times weekly for 4 weeks

## 2024-04-19 DIAGNOSIS — I10 Essential (primary) hypertension: Secondary | ICD-10-CM | POA: Diagnosis not present

## 2024-04-19 DIAGNOSIS — M810 Age-related osteoporosis without current pathological fracture: Secondary | ICD-10-CM | POA: Diagnosis not present

## 2024-04-19 DIAGNOSIS — F0394 Unspecified dementia, unspecified severity, with anxiety: Secondary | ICD-10-CM | POA: Diagnosis not present

## 2024-04-19 DIAGNOSIS — K5790 Diverticulosis of intestine, part unspecified, without perforation or abscess without bleeding: Secondary | ICD-10-CM | POA: Diagnosis not present

## 2024-04-19 DIAGNOSIS — M5412 Radiculopathy, cervical region: Secondary | ICD-10-CM | POA: Diagnosis not present

## 2024-04-19 DIAGNOSIS — E785 Hyperlipidemia, unspecified: Secondary | ICD-10-CM | POA: Diagnosis not present

## 2024-04-19 DIAGNOSIS — K219 Gastro-esophageal reflux disease without esophagitis: Secondary | ICD-10-CM | POA: Diagnosis not present

## 2024-04-19 DIAGNOSIS — G43909 Migraine, unspecified, not intractable, without status migrainosus: Secondary | ICD-10-CM | POA: Diagnosis not present

## 2024-04-19 DIAGNOSIS — Z48812 Encounter for surgical aftercare following surgery on the circulatory system: Secondary | ICD-10-CM | POA: Diagnosis not present

## 2024-04-19 DIAGNOSIS — M19041 Primary osteoarthritis, right hand: Secondary | ICD-10-CM | POA: Diagnosis not present

## 2024-04-20 DIAGNOSIS — M810 Age-related osteoporosis without current pathological fracture: Secondary | ICD-10-CM | POA: Diagnosis not present

## 2024-04-20 DIAGNOSIS — M5412 Radiculopathy, cervical region: Secondary | ICD-10-CM | POA: Diagnosis not present

## 2024-04-20 DIAGNOSIS — E785 Hyperlipidemia, unspecified: Secondary | ICD-10-CM | POA: Diagnosis not present

## 2024-04-20 DIAGNOSIS — I10 Essential (primary) hypertension: Secondary | ICD-10-CM | POA: Diagnosis not present

## 2024-04-20 DIAGNOSIS — M19041 Primary osteoarthritis, right hand: Secondary | ICD-10-CM | POA: Diagnosis not present

## 2024-04-20 DIAGNOSIS — Z48812 Encounter for surgical aftercare following surgery on the circulatory system: Secondary | ICD-10-CM | POA: Diagnosis not present

## 2024-04-20 DIAGNOSIS — F0394 Unspecified dementia, unspecified severity, with anxiety: Secondary | ICD-10-CM | POA: Diagnosis not present

## 2024-04-20 DIAGNOSIS — K5790 Diverticulosis of intestine, part unspecified, without perforation or abscess without bleeding: Secondary | ICD-10-CM | POA: Diagnosis not present

## 2024-04-20 DIAGNOSIS — G43909 Migraine, unspecified, not intractable, without status migrainosus: Secondary | ICD-10-CM | POA: Diagnosis not present

## 2024-04-20 DIAGNOSIS — K219 Gastro-esophageal reflux disease without esophagitis: Secondary | ICD-10-CM | POA: Diagnosis not present

## 2024-04-21 DIAGNOSIS — I10 Essential (primary) hypertension: Secondary | ICD-10-CM | POA: Diagnosis not present

## 2024-04-21 DIAGNOSIS — Z48812 Encounter for surgical aftercare following surgery on the circulatory system: Secondary | ICD-10-CM | POA: Diagnosis not present

## 2024-04-21 DIAGNOSIS — F0394 Unspecified dementia, unspecified severity, with anxiety: Secondary | ICD-10-CM | POA: Diagnosis not present

## 2024-04-21 DIAGNOSIS — K5790 Diverticulosis of intestine, part unspecified, without perforation or abscess without bleeding: Secondary | ICD-10-CM | POA: Diagnosis not present

## 2024-04-21 DIAGNOSIS — M19041 Primary osteoarthritis, right hand: Secondary | ICD-10-CM | POA: Diagnosis not present

## 2024-04-21 DIAGNOSIS — K219 Gastro-esophageal reflux disease without esophagitis: Secondary | ICD-10-CM | POA: Diagnosis not present

## 2024-04-21 DIAGNOSIS — G43909 Migraine, unspecified, not intractable, without status migrainosus: Secondary | ICD-10-CM | POA: Diagnosis not present

## 2024-04-21 DIAGNOSIS — M810 Age-related osteoporosis without current pathological fracture: Secondary | ICD-10-CM | POA: Diagnosis not present

## 2024-04-21 DIAGNOSIS — M5412 Radiculopathy, cervical region: Secondary | ICD-10-CM | POA: Diagnosis not present

## 2024-04-21 DIAGNOSIS — E785 Hyperlipidemia, unspecified: Secondary | ICD-10-CM | POA: Diagnosis not present

## 2024-04-21 NOTE — Progress Notes (Shared)
 [facility] - Clinic Note  05/03/2024    CHIEF COMPLAINT Patient presents for No chief complaint on file.   HISTORY OF PRESENT ILLNESS: [name] is a 83 y.o. female who presents to the clinic today for:    Patient states vision not the same as it was.   Referring physician: Alejandro Hurt, FNP [phone] [place],  [place] [address]  HISTORICAL INFORMATION:   Selected notes from the MEDICAL RECORD NUMBER Referred by Dr. Carloyn Chi for ex ARMD LEE:  Ocular Hx- PMH-    CURRENT MEDICATIONS: No current outpatient medications on file. (Ophthalmic Drugs)   No current facility-administered medications for this visit. (Ophthalmic Drugs)   Current Outpatient Medications (Other)  Medication Sig   acetaminophen  (TYLENOL ) 500 MG tablet Take 1 tablet (500 mg total) by mouth every 8 (eight) hours as needed.   apixaban  (ELIQUIS ) 5 MG TABS tablet Take 1 tablet (5 mg total) by mouth 2 (two) times daily.   lactose free nutrition (BOOST) LIQD Take 237 mLs by mouth 3 (three) times daily between meals.   metoprolol  succinate (TOPROL -XL) 50 MG 24 hr tablet Take 50 mg by mouth daily.   omeprazole (PRILOSEC) 40 MG capsule Take 1 capsule by mouth daily.   Pedialyte (PEDIALYTE) SOLN Take 240 mLs by mouth daily.   [Paused] rosuvastatin  (CRESTOR ) 10 MG tablet Take 10 mg by mouth daily.   senna-docusate (SENOKOT-S) 8.6-50 MG tablet Take 1 tablet by mouth 2 (two) times daily between meals as needed for mild constipation.   venlafaxine  XR (EFFEXOR -XR) 75 MG 24 hr capsule TAKE ONE CAPSULE BY MOUTH ONCE DAILY WITH  BREAKFAST   No current facility-administered medications for this visit. (Other)   REVIEW OF SYSTEMS:   ALLERGIES Allergies  Allergen Reactions   Levaquin  [Levofloxacin  In D5w] Nausea Only    Dizziness and tremulousness   PAST MEDICAL HISTORY Past Medical History:  Diagnosis Date   Actinic keratosis 12/03/2012   Allergy     Anxiety     Effexor  helps--psychiatrist Dr Deborra Falter   Arthritis    osteoarthritis, hands   Atypical chest pain    EF 86%, breast attenuation, no significant ischemia   Cataract    Cervical radiculopathy 11/18/2013   Chronic headache    takes Metoprolol  for this   Diverticulosis    Erosive esophagitis    GERD (gastroesophageal reflux disease) 11/2009   EGD: Dr. 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   Past Surgical History:  Procedure Laterality Date   AUGMENTATION MAMMAPLASTY Bilateral    silicone gel implants   Birth mark removed     CATARACT EXTRACTION Bilateral    COLONOSCOPY     COLPOSCOPY     laser vaporization of upper vagina  1998   LOWER EXTREMITY VENOGRAPHY  04/04/2024   Procedure: LOWER EXTREMITY VENOGRAPHY;  Surgeon: Philipp Brawn, MD;  Location: [facility] INVASIVE CV LAB;  Service: Cardiovascular;;   NM MYOCAR PERF WALL MOTION  01/24/2008   EF 86% neg ischemia   PERIPHERAL VASCULAR THROMBECTOMY Left 04/04/2024   Procedure: PERIPHERAL VASCULAR THROMBECTOMY;  Surgeon: Philipp Brawn, MD;  Location: [facility] INVASIVE CV LAB;  Service: Cardiovascular;  Laterality: Left;   VAGINAL HYSTERECTOMY  1978   FAMILY HISTORY Family History  Problem Relation Age of Onset   Heart disease Mother    Heart failure Mother    Breast cancer Mother 73   Prostate cancer Father    Inflammatory bowel disease Sister    Diabetes Maternal Aunt    Colon cancer Paternal Grandfather    Stomach cancer Neg Hx    Pancreatic cancer Neg Hx    Esophageal cancer Neg Hx    Rectal cancer Neg Hx    SOCIAL HISTORY Social History   Tobacco Use   Smoking status: Former     Current packs/day: 0.00    Average packs/day: 1 pack/day for 10.0 years (10.0 ttl pk-yrs)    Types: Cigarettes    Start date: 12/15/1978    Quit date: 12/15/1988    Years since quitting: 35.3   Smokeless tobacco: Never   Tobacco comments:    quit in the 1980's  Vaping Use   Vaping status: Never Used  Substance Use Topics   Alcohol use: Not Currently    Alcohol/week: 14.0 standard drinks of alcohol    Types: 14 Standard drinks or equivalent per week    Comment: drinks wine every day; 2 drinks a day    Drug use: No       OPHTHALMIC EXAM:  Not recorded    IMAGING AND PROCEDURES  Imaging and Procedures for 05/03/2024          ASSESSMENT/PLAN:  No diagnosis found.  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, right eye   - s/p IVA OD #1 (04.03.23), #2 (05.08.23), #3 (06.05.23), #4(07.11.23), #5 (08.22.23) -- IVA resistance, #6 (02.27.25) - s/p IVE OD #1 (09.23.23), #2 (10.11.23) #3 (12.06.23), #4 (01.19.24), #5 (02.22.24), #6 (03.15.24), #7 (05.08.24), #8 (06.19.24), #9 (08.07.24), #10 (10.02.24), #11 (12.13.24)  - at initial visit, pt reported 1-2 mo history of central "spot" in vision OD - FA (04.03.23) shows +CNV with mild late leakage IT fovea, no obvious smokestack or expansile dot **interval increase in SRF on Avastin  noted on OCT at 6 weeks on 08.22.23 on Eylea  at 6 wks on 01.19.24** - BCVA stable 20/30 - stable - OCT shows stable improvement in central SRF overlying stable low PED temporal fovea, patchy ORA at 11 weeks  - pt reports high stress and +steroid use -- ?CSR component                             - discussed [employer] funding unavailable - pt wishes to switch back to IVA OD #7 today (05.20.25), recommend f/u in 10 wks - RBA of procedure discussed, questions answered - IVA informed consent obtained and signed 02.27.25 - IVE OD informed consent obtained and signed on 09.29.23 - see procedure note   - f/u in 10 wks - DFE, OCT, possible injection  2. Age  related macular degeneration, non-exudative, left eye - The incidence, anatomy, and pathology of dry AMD, risk of progression, and the AREDS 2 study including smoking risks discussed with patient.  - Recommend amsler grid monitoring weekly  - monitor  3,4. Hypertensive retinopathy OU - discussed importance of tight BP control - continue to monitor  5. Pseudophakia OU  - s/p CE/IOL (Dr. Meridee Standing)  - IOL in good position, doing well  - continue to monitor  Ophthalmic Meds Ordered  this visit:  No orders of the defined types were placed in this encounter.   This document serves as a record of services personally performed by Jeanice Millard, MD, PhD. It was created on their behalf by Olene Berne, COT an ophthalmic technician. The creation of this record is the provider's dictation and/or activities during the visit.    Electronically signed by:  Olene Berne, COT  04/21/24 10:25 AM   Jeanice Millard, M.D., Ph.D. Diseases & Surgery of the Retina and Vitreous [facility] 02/10/2023    Abbreviations: M myopia (nearsighted); A astigmatism; H hyperopia (farsighted); P presbyopia; Mrx spectacle prescription;  CTL contact lenses; OD right eye; OS left eye; OU both eyes  XT exotropia; ET esotropia; PEK punctate epithelial keratitis; PEE punctate epithelial erosions; DES dry eye syndrome; MGD meibomian gland dysfunction; ATs artificial tears; PFAT's preservative free artificial tears; NSC nuclear sclerotic cataract; PSC posterior subcapsular cataract; ERM epi-retinal membrane; PVD posterior vitreous detachment; RD retinal detachment; DM diabetes mellitus; DR diabetic retinopathy; NPDR non-proliferative diabetic retinopathy; PDR proliferative diabetic retinopathy; CSME clinically significant macular edema; DME diabetic macular edema; dbh dot blot hemorrhages; CWS cotton wool spot; POAG primary open angle glaucoma; C/D cup-to-disc ratio; HVF humphrey visual field;  GVF goldmann visual field; OCT optical coherence tomography; IOP intraocular pressure; BRVO Branch retinal vein occlusion; CRVO central retinal vein occlusion; CRAO central retinal artery occlusion; BRAO branch retinal artery occlusion; RT retinal tear; SB scleral buckle; PPV pars plana vitrectomy; VH Vitreous hemorrhage; PRP panretinal laser photocoagulation; IVK intravitreal kenalog; VMT vitreomacular traction; MH Macular hole;  NVD neovascularization of the disc; NVE neovascularization elsewhere; AREDS age related eye disease study; ARMD age related macular degeneration; POAG primary open angle glaucoma; EBMD epithelial/anterior basement membrane dystrophy; ACIOL anterior chamber intraocular lens; IOL intraocular lens; PCIOL posterior chamber intraocular lens; Phaco/IOL phacoemulsification with intraocular lens placement; PRK photorefractive keratectomy; LASIK laser assisted in situ keratomileusis; HTN hypertension; DM diabetes mellitus; COPD chronic obstructive pulmonary disease

## 2024-04-22 ENCOUNTER — Other Ambulatory Visit: Payer: Self-pay | Admitting: Family Medicine

## 2024-04-22 ENCOUNTER — Encounter (INDEPENDENT_AMBULATORY_CARE_PROVIDER_SITE_OTHER): Payer: Medicare HMO | Admitting: Ophthalmology

## 2024-04-22 DIAGNOSIS — M8580 Other specified disorders of bone density and structure, unspecified site: Secondary | ICD-10-CM

## 2024-04-26 DIAGNOSIS — M810 Age-related osteoporosis without current pathological fracture: Secondary | ICD-10-CM | POA: Diagnosis not present

## 2024-04-26 DIAGNOSIS — G43909 Migraine, unspecified, not intractable, without status migrainosus: Secondary | ICD-10-CM | POA: Diagnosis not present

## 2024-04-26 DIAGNOSIS — Z48812 Encounter for surgical aftercare following surgery on the circulatory system: Secondary | ICD-10-CM | POA: Diagnosis not present

## 2024-04-26 DIAGNOSIS — M19041 Primary osteoarthritis, right hand: Secondary | ICD-10-CM | POA: Diagnosis not present

## 2024-04-26 DIAGNOSIS — F0394 Unspecified dementia, unspecified severity, with anxiety: Secondary | ICD-10-CM | POA: Diagnosis not present

## 2024-04-26 DIAGNOSIS — I10 Essential (primary) hypertension: Secondary | ICD-10-CM | POA: Diagnosis not present

## 2024-04-26 DIAGNOSIS — K5790 Diverticulosis of intestine, part unspecified, without perforation or abscess without bleeding: Secondary | ICD-10-CM | POA: Diagnosis not present

## 2024-04-26 DIAGNOSIS — M5412 Radiculopathy, cervical region: Secondary | ICD-10-CM | POA: Diagnosis not present

## 2024-04-26 DIAGNOSIS — K219 Gastro-esophageal reflux disease without esophagitis: Secondary | ICD-10-CM | POA: Diagnosis not present

## 2024-04-26 DIAGNOSIS — E785 Hyperlipidemia, unspecified: Secondary | ICD-10-CM | POA: Diagnosis not present

## 2024-04-28 DIAGNOSIS — F0394 Unspecified dementia, unspecified severity, with anxiety: Secondary | ICD-10-CM | POA: Diagnosis not present

## 2024-04-28 DIAGNOSIS — K5790 Diverticulosis of intestine, part unspecified, without perforation or abscess without bleeding: Secondary | ICD-10-CM | POA: Diagnosis not present

## 2024-04-28 DIAGNOSIS — G43909 Migraine, unspecified, not intractable, without status migrainosus: Secondary | ICD-10-CM | POA: Diagnosis not present

## 2024-04-28 DIAGNOSIS — M5412 Radiculopathy, cervical region: Secondary | ICD-10-CM | POA: Diagnosis not present

## 2024-04-28 DIAGNOSIS — M19041 Primary osteoarthritis, right hand: Secondary | ICD-10-CM | POA: Diagnosis not present

## 2024-04-28 DIAGNOSIS — I10 Essential (primary) hypertension: Secondary | ICD-10-CM | POA: Diagnosis not present

## 2024-04-28 DIAGNOSIS — K219 Gastro-esophageal reflux disease without esophagitis: Secondary | ICD-10-CM | POA: Diagnosis not present

## 2024-04-28 DIAGNOSIS — E785 Hyperlipidemia, unspecified: Secondary | ICD-10-CM | POA: Diagnosis not present

## 2024-04-28 DIAGNOSIS — M810 Age-related osteoporosis without current pathological fracture: Secondary | ICD-10-CM | POA: Diagnosis not present

## 2024-04-28 DIAGNOSIS — Z48812 Encounter for surgical aftercare following surgery on the circulatory system: Secondary | ICD-10-CM | POA: Diagnosis not present

## 2024-04-29 ENCOUNTER — Telehealth: Payer: Self-pay

## 2024-04-29 NOTE — Telephone Encounter (Signed)
 Called pt back from her after hours message/call. She did not answer-left VM for her to call us  if she still needs assistance.

## 2024-05-02 DIAGNOSIS — Z48812 Encounter for surgical aftercare following surgery on the circulatory system: Secondary | ICD-10-CM | POA: Diagnosis not present

## 2024-05-02 DIAGNOSIS — M19041 Primary osteoarthritis, right hand: Secondary | ICD-10-CM | POA: Diagnosis not present

## 2024-05-02 DIAGNOSIS — K5790 Diverticulosis of intestine, part unspecified, without perforation or abscess without bleeding: Secondary | ICD-10-CM | POA: Diagnosis not present

## 2024-05-02 DIAGNOSIS — M5412 Radiculopathy, cervical region: Secondary | ICD-10-CM | POA: Diagnosis not present

## 2024-05-02 DIAGNOSIS — K219 Gastro-esophageal reflux disease without esophagitis: Secondary | ICD-10-CM | POA: Diagnosis not present

## 2024-05-02 DIAGNOSIS — M810 Age-related osteoporosis without current pathological fracture: Secondary | ICD-10-CM | POA: Diagnosis not present

## 2024-05-02 DIAGNOSIS — E785 Hyperlipidemia, unspecified: Secondary | ICD-10-CM | POA: Diagnosis not present

## 2024-05-02 DIAGNOSIS — I10 Essential (primary) hypertension: Secondary | ICD-10-CM | POA: Diagnosis not present

## 2024-05-02 DIAGNOSIS — G43909 Migraine, unspecified, not intractable, without status migrainosus: Secondary | ICD-10-CM | POA: Diagnosis not present

## 2024-05-02 DIAGNOSIS — F0394 Unspecified dementia, unspecified severity, with anxiety: Secondary | ICD-10-CM | POA: Diagnosis not present

## 2024-05-03 ENCOUNTER — Encounter (INDEPENDENT_AMBULATORY_CARE_PROVIDER_SITE_OTHER): Admitting: Ophthalmology

## 2024-05-03 ENCOUNTER — Other Ambulatory Visit: Payer: Self-pay | Admitting: *Deleted

## 2024-05-03 DIAGNOSIS — H35033 Hypertensive retinopathy, bilateral: Secondary | ICD-10-CM

## 2024-05-03 DIAGNOSIS — H353122 Nonexudative age-related macular degeneration, left eye, intermediate dry stage: Secondary | ICD-10-CM

## 2024-05-03 DIAGNOSIS — I82412 Acute embolism and thrombosis of left femoral vein: Secondary | ICD-10-CM

## 2024-05-03 DIAGNOSIS — E785 Hyperlipidemia, unspecified: Secondary | ICD-10-CM | POA: Diagnosis not present

## 2024-05-03 DIAGNOSIS — K5790 Diverticulosis of intestine, part unspecified, without perforation or abscess without bleeding: Secondary | ICD-10-CM | POA: Diagnosis not present

## 2024-05-03 DIAGNOSIS — I10 Essential (primary) hypertension: Secondary | ICD-10-CM | POA: Diagnosis not present

## 2024-05-03 DIAGNOSIS — Z961 Presence of intraocular lens: Secondary | ICD-10-CM

## 2024-05-03 DIAGNOSIS — M5412 Radiculopathy, cervical region: Secondary | ICD-10-CM | POA: Diagnosis not present

## 2024-05-03 DIAGNOSIS — F0394 Unspecified dementia, unspecified severity, with anxiety: Secondary | ICD-10-CM | POA: Diagnosis not present

## 2024-05-03 DIAGNOSIS — M810 Age-related osteoporosis without current pathological fracture: Secondary | ICD-10-CM | POA: Diagnosis not present

## 2024-05-03 DIAGNOSIS — H353211 Exudative age-related macular degeneration, right eye, with active choroidal neovascularization: Secondary | ICD-10-CM

## 2024-05-03 DIAGNOSIS — G43909 Migraine, unspecified, not intractable, without status migrainosus: Secondary | ICD-10-CM | POA: Diagnosis not present

## 2024-05-03 DIAGNOSIS — Z48812 Encounter for surgical aftercare following surgery on the circulatory system: Secondary | ICD-10-CM | POA: Diagnosis not present

## 2024-05-03 DIAGNOSIS — K219 Gastro-esophageal reflux disease without esophagitis: Secondary | ICD-10-CM | POA: Diagnosis not present

## 2024-05-03 DIAGNOSIS — M19041 Primary osteoarthritis, right hand: Secondary | ICD-10-CM | POA: Diagnosis not present

## 2024-05-04 DIAGNOSIS — K219 Gastro-esophageal reflux disease without esophagitis: Secondary | ICD-10-CM | POA: Diagnosis not present

## 2024-05-04 DIAGNOSIS — G43909 Migraine, unspecified, not intractable, without status migrainosus: Secondary | ICD-10-CM | POA: Diagnosis not present

## 2024-05-04 DIAGNOSIS — Z48812 Encounter for surgical aftercare following surgery on the circulatory system: Secondary | ICD-10-CM | POA: Diagnosis not present

## 2024-05-04 DIAGNOSIS — K5790 Diverticulosis of intestine, part unspecified, without perforation or abscess without bleeding: Secondary | ICD-10-CM | POA: Diagnosis not present

## 2024-05-04 DIAGNOSIS — M5412 Radiculopathy, cervical region: Secondary | ICD-10-CM | POA: Diagnosis not present

## 2024-05-04 DIAGNOSIS — M810 Age-related osteoporosis without current pathological fracture: Secondary | ICD-10-CM | POA: Diagnosis not present

## 2024-05-04 DIAGNOSIS — M19041 Primary osteoarthritis, right hand: Secondary | ICD-10-CM | POA: Diagnosis not present

## 2024-05-04 DIAGNOSIS — E785 Hyperlipidemia, unspecified: Secondary | ICD-10-CM | POA: Diagnosis not present

## 2024-05-04 DIAGNOSIS — F0394 Unspecified dementia, unspecified severity, with anxiety: Secondary | ICD-10-CM | POA: Diagnosis not present

## 2024-05-04 DIAGNOSIS — I10 Essential (primary) hypertension: Secondary | ICD-10-CM | POA: Diagnosis not present

## 2024-05-05 DIAGNOSIS — K5790 Diverticulosis of intestine, part unspecified, without perforation or abscess without bleeding: Secondary | ICD-10-CM | POA: Diagnosis not present

## 2024-05-05 DIAGNOSIS — Z48812 Encounter for surgical aftercare following surgery on the circulatory system: Secondary | ICD-10-CM | POA: Diagnosis not present

## 2024-05-05 DIAGNOSIS — I10 Essential (primary) hypertension: Secondary | ICD-10-CM | POA: Diagnosis not present

## 2024-05-05 DIAGNOSIS — M19041 Primary osteoarthritis, right hand: Secondary | ICD-10-CM | POA: Diagnosis not present

## 2024-05-05 DIAGNOSIS — E785 Hyperlipidemia, unspecified: Secondary | ICD-10-CM | POA: Diagnosis not present

## 2024-05-05 DIAGNOSIS — F0394 Unspecified dementia, unspecified severity, with anxiety: Secondary | ICD-10-CM | POA: Diagnosis not present

## 2024-05-05 DIAGNOSIS — M5412 Radiculopathy, cervical region: Secondary | ICD-10-CM | POA: Diagnosis not present

## 2024-05-05 DIAGNOSIS — M810 Age-related osteoporosis without current pathological fracture: Secondary | ICD-10-CM | POA: Diagnosis not present

## 2024-05-05 DIAGNOSIS — K219 Gastro-esophageal reflux disease without esophagitis: Secondary | ICD-10-CM | POA: Diagnosis not present

## 2024-05-05 DIAGNOSIS — G43909 Migraine, unspecified, not intractable, without status migrainosus: Secondary | ICD-10-CM | POA: Diagnosis not present

## 2024-05-06 DIAGNOSIS — F0390 Unspecified dementia without behavioral disturbance: Secondary | ICD-10-CM | POA: Diagnosis not present

## 2024-05-06 DIAGNOSIS — I10 Essential (primary) hypertension: Secondary | ICD-10-CM | POA: Diagnosis not present

## 2024-05-06 DIAGNOSIS — F3341 Major depressive disorder, recurrent, in partial remission: Secondary | ICD-10-CM | POA: Diagnosis not present

## 2024-05-06 DIAGNOSIS — M25511 Pain in right shoulder: Secondary | ICD-10-CM | POA: Diagnosis not present

## 2024-05-10 ENCOUNTER — Telehealth: Payer: Self-pay

## 2024-05-10 DIAGNOSIS — I10 Essential (primary) hypertension: Secondary | ICD-10-CM | POA: Diagnosis not present

## 2024-05-10 DIAGNOSIS — M810 Age-related osteoporosis without current pathological fracture: Secondary | ICD-10-CM | POA: Diagnosis not present

## 2024-05-10 DIAGNOSIS — K219 Gastro-esophageal reflux disease without esophagitis: Secondary | ICD-10-CM | POA: Diagnosis not present

## 2024-05-10 DIAGNOSIS — E785 Hyperlipidemia, unspecified: Secondary | ICD-10-CM | POA: Diagnosis not present

## 2024-05-10 DIAGNOSIS — K5790 Diverticulosis of intestine, part unspecified, without perforation or abscess without bleeding: Secondary | ICD-10-CM | POA: Diagnosis not present

## 2024-05-10 DIAGNOSIS — M5412 Radiculopathy, cervical region: Secondary | ICD-10-CM | POA: Diagnosis not present

## 2024-05-10 DIAGNOSIS — M19041 Primary osteoarthritis, right hand: Secondary | ICD-10-CM | POA: Diagnosis not present

## 2024-05-10 DIAGNOSIS — Z48812 Encounter for surgical aftercare following surgery on the circulatory system: Secondary | ICD-10-CM | POA: Diagnosis not present

## 2024-05-10 DIAGNOSIS — G43909 Migraine, unspecified, not intractable, without status migrainosus: Secondary | ICD-10-CM | POA: Diagnosis not present

## 2024-05-10 DIAGNOSIS — F0394 Unspecified dementia, unspecified severity, with anxiety: Secondary | ICD-10-CM | POA: Diagnosis not present

## 2024-05-10 NOTE — Telephone Encounter (Signed)
 Will with [facility] OT called requesting VO for pt to get 2 additional weeks of OT, twice/week. VO given. No further questions/concerns.

## 2024-05-11 ENCOUNTER — Ambulatory Visit (HOSPITAL_COMMUNITY)
Admission: RE | Admit: 2024-05-11 | Discharge: 2024-05-11 | Disposition: A | Discharge status: Home / Self Care | Source: Ambulatory Visit | Attending: Vascular Surgery | Admitting: Vascular Surgery

## 2024-05-11 DIAGNOSIS — I82412 Acute embolism and thrombosis of left femoral vein: Secondary | ICD-10-CM | POA: Diagnosis not present

## 2024-05-12 DIAGNOSIS — G43909 Migraine, unspecified, not intractable, without status migrainosus: Secondary | ICD-10-CM | POA: Diagnosis not present

## 2024-05-12 DIAGNOSIS — M5412 Radiculopathy, cervical region: Secondary | ICD-10-CM | POA: Diagnosis not present

## 2024-05-12 DIAGNOSIS — I10 Essential (primary) hypertension: Secondary | ICD-10-CM | POA: Diagnosis not present

## 2024-05-12 DIAGNOSIS — M810 Age-related osteoporosis without current pathological fracture: Secondary | ICD-10-CM | POA: Diagnosis not present

## 2024-05-12 DIAGNOSIS — M25511 Pain in right shoulder: Secondary | ICD-10-CM | POA: Diagnosis not present

## 2024-05-12 DIAGNOSIS — F0394 Unspecified dementia, unspecified severity, with anxiety: Secondary | ICD-10-CM | POA: Diagnosis not present

## 2024-05-12 DIAGNOSIS — E785 Hyperlipidemia, unspecified: Secondary | ICD-10-CM | POA: Diagnosis not present

## 2024-05-12 DIAGNOSIS — M25521 Pain in right elbow: Secondary | ICD-10-CM | POA: Diagnosis not present

## 2024-05-12 DIAGNOSIS — K5790 Diverticulosis of intestine, part unspecified, without perforation or abscess without bleeding: Secondary | ICD-10-CM | POA: Diagnosis not present

## 2024-05-12 DIAGNOSIS — Z48812 Encounter for surgical aftercare following surgery on the circulatory system: Secondary | ICD-10-CM | POA: Diagnosis not present

## 2024-05-12 DIAGNOSIS — K219 Gastro-esophageal reflux disease without esophagitis: Secondary | ICD-10-CM | POA: Diagnosis not present

## 2024-05-12 DIAGNOSIS — M19041 Primary osteoarthritis, right hand: Secondary | ICD-10-CM | POA: Diagnosis not present

## 2024-05-13 ENCOUNTER — Ambulatory Visit (INDEPENDENT_AMBULATORY_CARE_PROVIDER_SITE_OTHER)

## 2024-05-13 ENCOUNTER — Other Ambulatory Visit (HOSPITAL_COMMUNITY): Payer: Self-pay | Admitting: Sports Medicine

## 2024-05-13 ENCOUNTER — Ambulatory Visit (HOSPITAL_COMMUNITY)
Admission: RE | Admit: 2024-05-13 | Discharge: 2024-05-13 | Disposition: A | Discharge status: Home / Self Care | Source: Ambulatory Visit | Attending: Vascular Surgery | Admitting: Vascular Surgery

## 2024-05-13 VITALS — BP 121/72 | HR 69 | Temp 98.2°F | Ht 65.0 in | Wt 130.0 lb

## 2024-05-13 DIAGNOSIS — I10 Essential (primary) hypertension: Secondary | ICD-10-CM | POA: Diagnosis not present

## 2024-05-13 DIAGNOSIS — F0394 Unspecified dementia, unspecified severity, with anxiety: Secondary | ICD-10-CM | POA: Diagnosis not present

## 2024-05-13 DIAGNOSIS — M5412 Radiculopathy, cervical region: Secondary | ICD-10-CM | POA: Diagnosis not present

## 2024-05-13 DIAGNOSIS — K5790 Diverticulosis of intestine, part unspecified, without perforation or abscess without bleeding: Secondary | ICD-10-CM | POA: Diagnosis not present

## 2024-05-13 DIAGNOSIS — M810 Age-related osteoporosis without current pathological fracture: Secondary | ICD-10-CM | POA: Diagnosis not present

## 2024-05-13 DIAGNOSIS — Z48812 Encounter for surgical aftercare following surgery on the circulatory system: Secondary | ICD-10-CM | POA: Diagnosis not present

## 2024-05-13 DIAGNOSIS — M25521 Pain in right elbow: Secondary | ICD-10-CM

## 2024-05-13 DIAGNOSIS — E785 Hyperlipidemia, unspecified: Secondary | ICD-10-CM | POA: Diagnosis not present

## 2024-05-13 DIAGNOSIS — I82412 Acute embolism and thrombosis of left femoral vein: Secondary | ICD-10-CM | POA: Diagnosis not present

## 2024-05-13 DIAGNOSIS — K219 Gastro-esophageal reflux disease without esophagitis: Secondary | ICD-10-CM | POA: Diagnosis not present

## 2024-05-13 DIAGNOSIS — G43909 Migraine, unspecified, not intractable, without status migrainosus: Secondary | ICD-10-CM | POA: Diagnosis not present

## 2024-05-13 DIAGNOSIS — M19041 Primary osteoarthritis, right hand: Secondary | ICD-10-CM | POA: Diagnosis not present

## 2024-05-13 MED ORDER — ENOXAPARIN SODIUM 100 MG/ML IJ SOSY: 100.0000 mg | PREFILLED_SYRINGE | INTRAMUSCULAR | 1 refills | Status: DC

## 2024-05-13 NOTE — Progress Notes (Signed)
 Office Note     CC:  follow up Requesting Provider:  Alejandro Hurt, FNP  HPI: [name] is a 83 y.o. (1941-01-01) female who presents for follow up of left lower extremity DVT s/p mechanical thrombectomy on 04/04/24 by Dr. Susi Eric. She had extensive left iliofemoral DVT causing significant left lower extremity swelling. She had good results after the thrombectomy. She was discharged home on Eliquis .   Today she presents with her Husband. They report overall she has been doing very well. No pain or swelling in the left leg until yesterday. Yesterday morning they noticed she had a little swelling present in her left leg. She denies any pain in her leg. She has not been wearing her compression stocking for the past 1 week. Otherwise she says she has been active. Doing PT and walking a lot more with assistance. They report she has been compliant with her Eliquis  daily. Have not missed any doses.   Past Medical History:  Diagnosis Date   Actinic keratosis 12/03/2012   Allergy     Anxiety    Effexor  helps--psychiatrist Dr Deborra Falter   Arthritis    osteoarthritis, hands   Atypical chest pain    EF 86%, breast attenuation, no significant ischemia   Cataract    Cervical radiculopathy 11/18/2013   Chronic headache    takes Metoprolol  for this   Diverticulosis    Erosive esophagitis    GERD (gastroesophageal reflux disease) 11/2009   EGD: Dr. 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    Past Surgical History:  Procedure Laterality Date   AUGMENTATION MAMMAPLASTY Bilateral    silicone gel implants   Birth mark removed     CATARACT EXTRACTION  Bilateral    COLONOSCOPY     COLPOSCOPY     laser vaporization of upper vagina  1998   LOWER EXTREMITY VENOGRAPHY  04/04/2024   Procedure: LOWER EXTREMITY VENOGRAPHY;  Surgeon: Philipp Brawn, MD;  Location: [facility];  Service: Cardiovascular;;   NM MYOCAR PERF WALL MOTION  01/24/2008   EF 86% neg ischemia   PERIPHERAL VASCULAR THROMBECTOMY Left 04/04/2024   Procedure: PERIPHERAL VASCULAR THROMBECTOMY;  Surgeon: Philipp Brawn, MD;  Location: [facility] INVASIVE CV [facility];  Service: Cardiovascular;  Laterality: Left;   VAGINAL HYSTERECTOMY  1978    Social History   Socioeconomic History   Marital status: Married    Spouse name: Not on file   Number of children: 1   Years of education: Not on file   Highest education level: Not on file  Occupational History   Occupation: retired  Tobacco Use   Smoking status: Former    Current packs/day: 0.00    Average packs/day: 1 pack/day for 10.0 years (10.0 ttl pk-yrs)    Types: Cigarettes    Start date: 12/15/1978    Quit date: 12/15/1988    Years since quitting: 35.4   Smokeless tobacco: Never   Tobacco comments:    quit in the 1980's  Vaping Use   Vaping status: Never Used  Substance and Sexual Activity   Alcohol use: Not Currently  Alcohol/week: 14.0 standard drinks of alcohol    Types: 14 Standard drinks or equivalent per week    Comment: drinks wine every day; 2 drinks a day    Drug use: No   Sexual activity: Never    Birth control/protection: Surgical, Post-menopausal    Comment: HYST  Other Topics Concern   Not on file  Social History Narrative   Married, mother of one.  Lives with spouse and 4 dogs.   Very active. Regular exercise  -- yes.  Quit smoking in 1990.     She monitors her diet well.     1-2 glasses of wine almost every night.   Social Drivers of Corporate investment banker Strain: Not on file  Food Insecurity: Patient Declined (04/01/2024)   Hunger Vital Sign    Worried About Running Out of Food in the  Last Year: Patient declined    Ran Out of Food in the Last Year: Patient declined  Transportation Needs: Patient Declined (04/01/2024)   PRAPARE - Administrator, Civil Service (Medical): Patient declined    Lack of Transportation (Non-Medical): Patient declined  Physical Activity: Not on file  Stress: Not on file  Social Connections: Patient Declined (04/01/2024)   Social Connection and Isolation Panel [NHANES]    Frequency of Communication with Friends and Family: Patient declined    Frequency of Social Gatherings with Friends and Family: Patient declined    Attends Religious Services: Patient declined    Database administrator or Organizations: Patient declined    Attends Banker Meetings: Patient declined    Marital Status: Patient declined  Intimate Partner Violence: Patient Unable To Answer (04/01/2024)   Humiliation, Afraid, Rape, and Kick questionnaire    Fear of Current or Ex-Partner: Patient unable to answer    Emotionally Abused: Patient unable to answer    Physically Abused: Patient unable to answer    Sexually Abused: Patient unable to answer    Family History  Problem Relation Age of Onset   Heart disease Mother    Heart failure Mother    Breast cancer Mother 92   Prostate cancer Father    Inflammatory bowel disease Sister    Diabetes Maternal Aunt    Colon cancer Paternal Grandfather    Stomach cancer Neg Hx    Pancreatic cancer Neg Hx    Esophageal cancer Neg Hx    Rectal cancer Neg Hx     Current Outpatient Medications  Medication Sig Dispense Refill   acetaminophen  (TYLENOL ) 500 MG tablet Take 1 tablet (500 mg total) by mouth every 8 (eight) hours as needed. 15 tablet 0   apixaban  (ELIQUIS ) 5 MG TABS tablet Take 1 tablet (5 mg total) by mouth 2 (two) times daily. 180 tablet 1   lactose free nutrition (BOOST) LIQD Take 237 mLs by mouth 3 (three) times daily between meals.     metoprolol  succinate (TOPROL -XL) 50 MG 24 hr tablet Take 50  mg by mouth daily.     omeprazole (PRILOSEC) 40 MG capsule Take 1 capsule by mouth daily.     Pedialyte (PEDIALYTE) SOLN Take 240 mLs by mouth daily.     rosuvastatin  (CRESTOR ) 10 MG tablet Take 10 mg by mouth daily.     senna-docusate (SENOKOT-S) 8.6-50 MG tablet Take 1 tablet by mouth 2 (two) times daily between meals as needed for mild constipation.     venlafaxine  XR (EFFEXOR -XR) 75 MG 24 hr capsule TAKE ONE CAPSULE BY MOUTH  ONCE DAILY WITH  BREAKFAST 90 capsule 3   No current facility-administered medications for this visit.    Allergies  Allergen Reactions   Levaquin  [Levofloxacin  In D5w] Nausea Only    Dizziness and tremulousness     REVIEW OF SYSTEMS:  [X]  denotes positive finding, [ ]  denotes negative finding Cardiac  Comments:  Chest pain or chest pressure:    Shortness of breath upon exertion:    Short of breath when lying flat:    Irregular heart rhythm:        Vascular    Pain in calf, thigh, or hip brought on by ambulation:    Pain in feet at night that wakes you up from your sleep:     Blood clot in your veins:    Leg swelling:  X       Pulmonary    Oxygen at home:    Productive cough:     Wheezing:         Neurologic    Sudden weakness in arms or legs:     Sudden numbness in arms or legs:     Sudden onset of difficulty speaking or slurred speech:    Temporary loss of vision in one eye:     Problems with dizziness:         Gastrointestinal    Blood in stool:     Vomited blood:         Genitourinary    Burning when urinating:     Blood in urine:        Psychiatric    Major depression:         Hematologic    Bleeding problems:    Problems with blood clotting too easily:        Skin    Rashes or ulcers:        Constitutional    Fever or chills:      PHYSICAL EXAMINATION:  Vitals:   05/13/24 0840  BP: 121/72  Pulse: 69  Temp: 98.2 F (36.8 C)  TempSrc: Temporal  SpO2: 93%  Weight: 130 lb (59 kg)  Height: 5\' 5"  (1.651 m)     General:  WDWN in NAD; vital signs documented above Gait: Not observed, in wheel chair HENT: WNL, normocephalic Pulmonary: normal non-labored breathing Cardiac: regular HR Vascular Exam/Pulses: 2+ femoral, 2+ DP/ PT pulses Extremities: without ischemic changes, without Gangrene , without cellulitis; without open wounds; very minimal swelling Musculoskeletal: no muscle wasting or atrophy  Neurologic: A&O X 3 Psychiatric:  The pt has Normal affect.   Non-Invasive Vascular Imaging:   VAS US  lower extremity venous duplex (DVT): Summary:  RIGHT:  No evidence of common femoral vein obstruction.    LEFT:  Acute, occlusive deep vein thrombosis within the common femoral, femoral,  popliteal, posterior tibial and peroneal veins. The great and small  saphenous veins appear patent.   A cystic structure is found in the popliteal fossa measuring 2.97 x 0.83  cm.   VAS US  IVC/Iliac Bilat: 05/11/24 Summary:  IVC/Iliac: There is no evidence of thrombus involving the IVC. There is no  evidence of thrombus involving the right common iliac vein. There is no  evidence of thrombus involving the right external iliac vein.   Left: There appears to be thrombus in the externail iliac vein mid to  distal segment. Unable to visualized the left common iliac vein. No color  flow noted in the distal common femoral vein, proximal femoral vein and  profunda  suggestive of possible acute  DVT.   NOTE: This was a technically difficult and limited exam.   ASSESSMENT/PLAN:: 83 y.o. female here for follow up of left lower extremity DVT s/p mechanical thrombectomy on 04/04/24 by Dr. Susi Eric. She had extensive left iliofemoral DVT causing significant left lower extremity swelling. She was discharged home on Eliquis , which she has been compliant in taking 2x/ day. She has been doing well post intervention. She did notice some swelling in left leg yesterday. She presented for non invasive studies on 5/28 and  further LLE DVT duplex today. 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 I discussed with patient and her husband that she likely is a non responder to the Eliquis  since she has been taking it as directed. I discussed management options with Dr. Susi Eric for repeat mechanical thrombectomy vs medical management. At this time she is minimally symptomatic. I have recommended medical management. I talked to our on Staff Pharmacist, [place] who recommends Heme/ Onc referral and Lovenox over another DOAC. Concern with another DOAC is similarly ineffective anticoagulation. I discussed warfarin with patient and her Husband as an option vs Pradaxa, both of which would need bridged with Lovenox. At this time I have recommended stopping Eliquis  and starting Lovenox 1 mg/kg/ day. Will place STAT referral to Hematology for further evaluation. I will have her follow up with us  in 6 weeks with repeat IVC/ iliac duplex and LLE DVT duplex to follow. Patient and Husband understand should she have increased swelling, pain, Shortness of breath or chest pain to go to the ER.    Deneen Finical, PA-C [facility] [phone]  Clinic MD:   Susi Eric

## 2024-05-15 ENCOUNTER — Ambulatory Visit (HOSPITAL_BASED_OUTPATIENT_CLINIC_OR_DEPARTMENT_OTHER)
Admission: RE | Admit: 2024-05-15 | Discharge: 2024-05-15 | Disposition: A | Discharge status: Home / Self Care | Source: Ambulatory Visit | Attending: Sports Medicine | Admitting: Sports Medicine

## 2024-05-15 DIAGNOSIS — M25521 Pain in right elbow: Secondary | ICD-10-CM | POA: Diagnosis not present

## 2024-05-16 ENCOUNTER — Other Ambulatory Visit: Payer: Self-pay | Admitting: *Deleted

## 2024-05-16 DIAGNOSIS — I82412 Acute embolism and thrombosis of left femoral vein: Secondary | ICD-10-CM

## 2024-05-17 DIAGNOSIS — R399 Unspecified symptoms and signs involving the genitourinary system: Secondary | ICD-10-CM | POA: Diagnosis not present

## 2024-05-17 DIAGNOSIS — N390 Urinary tract infection, site not specified: Secondary | ICD-10-CM | POA: Diagnosis not present

## 2024-05-18 ENCOUNTER — Telehealth: Payer: Self-pay

## 2024-05-18 NOTE — Telephone Encounter (Signed)
 Christy, PTA from [facility] called asking if PT is safe for [name] given she still has a clot in her LLE.  [place], PA was consulted.  Pt is safe and there are no restrictions.  Information given to [place].

## 2024-05-23 ENCOUNTER — Inpatient Hospital Stay

## 2024-05-23 ENCOUNTER — Inpatient Hospital Stay: Attending: Oncology | Admitting: Oncology

## 2024-05-23 VITALS — BP 146/69 | HR 98 | Temp 97.5°F | Resp 18 | Wt 116.2 lb

## 2024-05-23 DIAGNOSIS — I82423 Acute embolism and thrombosis of iliac vein, bilateral: Secondary | ICD-10-CM | POA: Diagnosis not present

## 2024-05-23 DIAGNOSIS — Z87891 Personal history of nicotine dependence: Secondary | ICD-10-CM | POA: Insufficient documentation

## 2024-05-23 DIAGNOSIS — Z7901 Long term (current) use of anticoagulants: Secondary | ICD-10-CM | POA: Insufficient documentation

## 2024-05-23 DIAGNOSIS — D6851 Activated protein C resistance: Secondary | ICD-10-CM | POA: Diagnosis not present

## 2024-05-23 DIAGNOSIS — I82432 Acute embolism and thrombosis of left popliteal vein: Secondary | ICD-10-CM | POA: Insufficient documentation

## 2024-05-23 DIAGNOSIS — I82412 Acute embolism and thrombosis of left femoral vein: Secondary | ICD-10-CM | POA: Diagnosis not present

## 2024-05-23 DIAGNOSIS — I82442 Acute embolism and thrombosis of left tibial vein: Secondary | ICD-10-CM | POA: Insufficient documentation

## 2024-05-23 LAB — CBC WITH DIFFERENTIAL (CANCER CENTER ONLY)
Abs Immature Granulocytes: 0.03 10*3/uL (ref 0.00–0.07)
Basophils Absolute: 0 10*3/uL (ref 0.0–0.1)
Basophils Relative: 1 %
Eosinophils Absolute: 0.1 10*3/uL (ref 0.0–0.5)
Eosinophils Relative: 1 %
HCT: 40 % (ref 36.0–46.0)
Hemoglobin: 12.9 g/dL (ref 12.0–15.0)
Immature Granulocytes: 0 %
Lymphocytes Relative: 23 %
Lymphs Abs: 1.6 10*3/uL (ref 0.7–4.0)
MCH: 28.1 pg (ref 26.0–34.0)
MCHC: 32.3 g/dL (ref 30.0–36.0)
MCV: 87.1 fL (ref 80.0–100.0)
Monocytes Absolute: 0.5 10*3/uL (ref 0.1–1.0)
Monocytes Relative: 7 %
Neutro Abs: 4.7 10*3/uL (ref 1.7–7.7)
Neutrophils Relative %: 68 %
Platelet Count: 318 10*3/uL (ref 150–400)
RBC: 4.59 MIL/uL (ref 3.87–5.11)
RDW: 15.4 % (ref 11.5–15.5)
WBC Count: 7 10*3/uL (ref 4.0–10.5)
nRBC: 0 % (ref 0.0–0.2)

## 2024-05-23 LAB — CMP (CANCER CENTER ONLY)
ALT: 49 U/L — ABNORMAL HIGH (ref 0–44)
AST: 35 U/L (ref 15–41)
Albumin: 4.1 g/dL (ref 3.5–5.0)
Alkaline Phosphatase: 176 U/L — ABNORMAL HIGH (ref 38–126)
Anion gap: 20 — ABNORMAL HIGH (ref 5–15)
BUN: 19 mg/dL (ref 8–23)
CO2: 15 mmol/L — ABNORMAL LOW (ref 22–32)
Calcium: 10.6 mg/dL — ABNORMAL HIGH (ref 8.9–10.3)
Chloride: 98 mmol/L (ref 98–111)
Creatinine: 0.89 mg/dL (ref 0.44–1.00)
GFR, Estimated: >60 mL/min (ref >60)
Glucose, Bld: 110 mg/dL — ABNORMAL HIGH (ref 70–99)
Potassium: 4.4 mmol/L (ref 3.5–5.1)
Sodium: 133 mmol/L — ABNORMAL LOW (ref 135–145)
Total Bilirubin: 0.5 mg/dL (ref 0.0–1.2)
Total Protein: 8.6 g/dL — ABNORMAL HIGH (ref 6.5–8.1)

## 2024-05-23 LAB — D-DIMER, QUANTITATIVE: D-Dimer, Quant: 2.08 ug{FEU}/mL — ABNORMAL HIGH (ref 0.00–0.50)

## 2024-05-23 LAB — LACTATE DEHYDROGENASE: LDH: 248 U/L — ABNORMAL HIGH (ref 98–192)

## 2024-05-23 NOTE — Progress Notes (Signed)
 [facility]  HEMATOLOGY CLINIC CONSULTATION NOTE   PATIENT NAME: [name]   MR#: [id number] DOB: 04-09-1941  DATE OF SERVICE: 05/23/2024   REFERRING PROVIDER  Runell Countryman, MD  Patient Care Team: Alejandro Hurt, FNP as PCP - General (Family Medicine) Rogene Claude, MD as Consulting Physician (Obstetrics and Gynecology) Carlene Che, MD as Consulting Physician (Vascular Surgery)   REASON FOR CONSULTATION/ CHIEF COMPLAINT:  Acute DVT of left lower extremity, diagnosed in April 2025.  ASSESSMENT & PLAN:  [name] is a 83 y.o. lady with a past medical history of hypertension, dementia, was referred to our service for evaluation after she was diagnosed with acute DVT of the left lower extremity in April 2025.  No problem-specific Assessment & Plan notes found for this encounter.   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 Currently on enoxaparin  injections, 100 mg once daily. Swelling and pain have been intermittent. Concerns about potential absorption issues with oral anticoagulants. No recent surgeries, prolonged immobility, or long trips contributing to clot formation. Weight loss noted without significant changes in appetite or activity level. Plan to investigate potential blood clotting disorders. Blood thinners need continuation for at least three months, with re-evaluation at that point. Alternative oral anticoagulants such as Xarelto or Pradaxa may be considered if ultrasound shows good response. - Continue enoxaparin  injections, 100 mg once daily, for three months from diagnosis. - Order blood tests to investigate potential clotting disorders. - Schedule follow-up ultrasound at the end of the month to assess clot resolution. - Consider switching to an alternative oral anticoagulant such as Xarelto or  Pradaxa if ultrasound shows good response. - Re-evaluate anticoagulation therapy in three months.   I reviewed lab results and outside records for this visit and discussed relevant results with the patient. Diagnosis, plan of care and treatment options were also discussed in detail with the patient. Opportunity provided to ask questions and answers provided to her apparent satisfaction. Provided instructions to call our clinic with any problems, questions or concerns prior to return visit. I recommended to continue follow-up with PCP and sub-specialists. She verbalized understanding and agreed with the plan. No barriers to learning was detected.  Arlo Berber, MD  05/26/2024 1:26 PM  [facility] - A DEPT OF [facility] [id number] [place] [address] Dept: [phone] Dept Fax: [phone]   HISTORY OF PRESENT ILLNESS: ***  Discussed the use of AI scribe software for clinical note transcription with the patient, who gave verbal consent to proceed.  History of Present Illness [name] is an 83 year old female with deep vein thrombosis who presents for follow-up of a left leg clot. She was referred by Dr. Zoila Hines from the DVT clinic for management of her left leg clot.  She has a history of a clot in her left leg, initially treated with Eliquis . Despite this treatment, she experienced a recurrence of symptoms, including swelling and pain, which led to a hospital stay where the clot was mechanically removed. Post-procedure, she was stable for over a month before symptoms returned.  Currently, she is on a regimen of 100 mg injections once daily, administered by her caregiver at 7 PM. 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 Her leg occasionally  swells slightly.  Prior to the clot, she was fairly active and not bedbound. There were no recent surgeries,  long road trips, or flights that could have contributed to the clot. Her caregiver attempted to manage her hydration with water and Pedialyte when symptoms first appeared.  She has experienced a weight loss of 15 pounds, now weighing around 115-116 pounds. Her caregiver ensures she eats throughout the day, providing small meals and snacks to maintain her weight. Despite this, her weight has remained stable at the lower level.  No chest pain, trouble breathing, or cough.  On 04/01/2024, she was diagnosed with acute DVT involving the left common femoral vein, SF junction, left femoral vein, left proximal profunda vein, left popliteal vein, left posterior tibial veins, left peroneal veins, and EIV. Findings consistent with acute intramuscular thrombosis involving the left gastrocnemius veins.  On 04/04/2024, she underwent mechanical thrombectomy of the left common iliac, external iliac, common femoral, femoral and popliteal veins.  Patient had symptom relief after mechanical thrombectomy.  However she started having pain in the left lower extremity again around 05/12/2024.  Repeat ultrasound on 05/13/2024 showed acute, occlusive deep vein thrombosis within the common femoral, femoral, popliteal, posterior tibial and peroneal veins. The great and small saphenous veins appear patent.  There was a concern if she was not responding to Eliquis .  Hence other DOAC's were not chosen and patient was instead started on Lovenox  1 mg/kg subcutaneously daily and referral was sent to us  for further evaluation.  She denies fever, cough, diarrhea, or other infectious symptoms.  She denies epistaxis, bloody stool, melena, hematuria, bruising or other bleeding symptoms. She also denies unintentional weight loss, night sweats or other constitutional symptoms.  MEDICAL HISTORY Past Medical History:  Diagnosis Date   Actinic keratosis 12/03/2012   Allergy     Anxiety    Effexor  helps--psychiatrist Dr Deborra Falter   Arthritis     osteoarthritis, hands   Atypical chest pain    EF 86%, breast attenuation, no significant ischemia   Cataract    Cervical radiculopathy 11/18/2013   Chronic headache    takes Metoprolol  for this   Diverticulosis    Erosive esophagitis    GERD (gastroesophageal reflux disease) 11/2009   EGD: Dr. 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     SURGICAL HISTORY Past Surgical History:  Procedure Laterality Date   AUGMENTATION MAMMAPLASTY Bilateral    silicone gel implants   Birth mark removed     CATARACT EXTRACTION Bilateral    COLONOSCOPY     COLPOSCOPY     laser vaporization of upper vagina  1998   LOWER EXTREMITY VENOGRAPHY  04/04/2024   Procedure: LOWER EXTREMITY VENOGRAPHY;  Surgeon: Philipp Brawn, MD;  Location: [facility] INVASIVE CV LAB;  Service: Cardiovascular;;   NM MYOCAR PERF WALL MOTION  01/24/2008   EF 86% neg ischemia   PERIPHERAL VASCULAR THROMBECTOMY Left 04/04/2024   Procedure: PERIPHERAL VASCULAR THROMBECTOMY;  Surgeon: Philipp Brawn, MD;  Location: [facility] INVASIVE CV LAB;  Service: Cardiovascular;  Laterality: Left;   VAGINAL HYSTERECTOMY  1978     SOCIAL HISTORY: She reports  that she quit smoking about 35 years ago. Her smoking use included cigarettes. She started smoking about 45 years ago. She has a 10 pack-year smoking history. She has never used smokeless tobacco. She reports that she does not currently use alcohol after a past usage of about 14.0 standard drinks of alcohol per week. She reports that she does not use drugs. Social History   Socioeconomic History   Marital status: Married    Spouse name: Not on file    Number of children: 1   Years of education: Not on file   Highest education level: Not on file  Occupational History   Occupation: retired  Tobacco Use   Smoking status: Former    Current packs/day: 0.00    Average packs/day: 1 pack/day for 10.0 years (10.0 ttl pk-yrs)    Types: Cigarettes    Start date: 12/15/1978    Quit date: 12/15/1988    Years since quitting: 35.4   Smokeless tobacco: Never   Tobacco comments:    quit in the 1980's  Vaping Use   Vaping status: Never Used  Substance and Sexual Activity   Alcohol use: Not Currently    Alcohol/week: 14.0 standard drinks of alcohol    Types: 14 Standard drinks or equivalent per week    Comment: drinks wine every day; 2 drinks a day    Drug use: No   Sexual activity: Never    Birth control/protection: Surgical, Post-menopausal    Comment: HYST  Other Topics Concern   Not on file  Social History Narrative   Married, mother of one.  Lives with spouse and 4 dogs.   Very active. Regular exercise  -- yes.  Quit smoking in 1990.     She monitors her diet well.     1-2 glasses of wine almost every night.   Social Drivers of Corporate investment banker Strain: Not on file  Food Insecurity: Patient Declined (04/01/2024)   Hunger Vital Sign    Worried About Running Out of Food in the Last Year: Patient declined    Ran Out of Food in the Last Year: Patient declined  Transportation Needs: Patient Declined (04/01/2024)   PRAPARE - Administrator, Civil Service (Medical): Patient declined    Lack of Transportation (Non-Medical): Patient declined  Physical Activity: Not on file  Stress: Not on file  Social Connections: Patient Declined (04/01/2024)   Social Connection and Isolation Panel    Frequency of Communication with Friends and Family: Patient declined    Frequency of Social Gatherings with Friends and Family: Patient declined    Attends Religious Services: Patient declined    Database administrator or Organizations:  Patient declined    Attends Banker Meetings: Patient declined    Marital Status: Patient declined  Intimate Partner Violence: Patient Unable To Answer (04/01/2024)   Humiliation, Afraid, Rape, and Kick questionnaire    Fear of Current or Ex-Partner: Patient unable to answer    Emotionally Abused: Patient unable to answer    Physically Abused: Patient unable to answer    Sexually Abused: Patient unable to answer    FAMILY HISTORY: Her family history includes Breast cancer (age of onset: 20) in her mother; Colon cancer in her paternal grandfather; Diabetes in her maternal aunt; Heart disease in her mother; Heart failure in her mother; Inflammatory bowel disease in her sister; Prostate cancer in her father.  CURRENT MEDICATIONS   Current Outpatient Medications  Medication Instructions  acetaminophen  (TYLENOL ) 500 mg, Oral, Every 8 hours PRN   enoxaparin  (LOVENOX ) 100 mg, Subcutaneous, Every 24 hours   lactose free nutrition (BOOST) LIQD 237 mLs, 3 times daily between meals   metoprolol  succinate (TOPROL -XL) 50 mg, Daily   omeprazole (PRILOSEC) 40 MG capsule 1 capsule, Daily   Pedialyte (PEDIALYTE) SOLN 240 mLs, Daily   rosuvastatin  (CRESTOR ) 10 mg, Daily   senna-docusate (SENOKOT-S) 8.6-50 MG tablet 1 tablet, Oral, 2 times daily between meals PRN   venlafaxine  XR (EFFEXOR -XR) 75 MG 24 hr capsule TAKE ONE CAPSULE BY MOUTH ONCE DAILY WITH  BREAKFAST     ALLERGIES  She is allergic to levaquin  [levofloxacin  in d5w].  REVIEW OF SYSTEMS:  Review of Systems - Oncology   Rest of the pertinent review of systems is unremarkable except as mentioned above in HPI.  PHYSICAL EXAMINATION:      Vitals:   05/23/24 1349  BP: (!) 146/69  Pulse: 98  Resp: 18  Temp: (!) 97.5 F (36.4 C)  SpO2: 93%   Filed Weights   05/23/24 1349  Weight: 116 lb 3.2 oz (52.7 kg)    Physical Exam Constitutional:      General: She is not in acute distress.    Appearance: Normal  appearance.  HENT:     Head: Normocephalic and atraumatic.   Cardiovascular:     Rate and Rhythm: Normal rate.     Heart sounds: Normal heart sounds.  Pulmonary:     Effort: Pulmonary effort is normal. No respiratory distress.     Breath sounds: Normal breath sounds.  Abdominal:     General: There is no distension.   Neurological:     General: No focal deficit present.     Mental Status: She is alert and oriented to person, place, and time.   Psychiatric:        Mood and Affect: Mood normal.        Behavior: Behavior normal.      LABORATORY DATA:   I have reviewed the data as listed.  Results for orders placed or performed in visit on 05/23/24  Antithrombin panel  Result Value Ref Range   Antithrombin Activity 113 75 - 135 %   AT III AG PPP IMM-ACNC 93 72 - 124 %  PROTEIN S PANEL  Result Value Ref Range   Protein S Ag, Total 182 (H) 60 - 150 %   Protein S Ag, Free 128 61 - 136 %   Protein S Activity 67 63 - 140 %  Protein C activity  Result Value Ref Range   Protein C Activity 134 73 - 180 %  Beta-2 -glycoprotein i abs, IgG/M/A  Result Value Ref Range   Beta-2  Glyco I IgG <9 0 - 20 GPI IgG units   Beta-2 -Glycoprotein I IgM <9 0 - 32 GPI IgM units   Beta-2 -Glycoprotein I IgA <9 0 - 25 GPI IgA units  Cardiolipin antibodies, IgG, IgM, IgA  Result Value Ref Range   Anticardiolipin IgG <9 0 - 14 GPL U/mL   Anticardiolipin IgM 11 0 - 12 MPL U/mL   Anticardiolipin IgA <9 0 - 11 APL U/mL  Lupus anticoagulant panel  Result Value Ref Range   PTT Lupus Anticoagulant 35.4 0.0 - 43.5 sec   DRVVT 40.9 0.0 - 47.0 sec   Lupus Anticoag Interp Comment:   D-dimer, quantitative  Result Value Ref Range   D-Dimer, Quant 2.08 (H) 0.00 - 0.50 ug/mL-FEU  Lactate dehydrogenase  Result Value Ref Range  LDH 248 (H) 98 - 192 U/L  CMP (Cancer Center only)  Result Value Ref Range   Sodium 133 (L) 135 - 145 mmol/L   Potassium 4.4 3.5 - 5.1 mmol/L   Chloride 98 98 - 111 mmol/L   CO2  15 (L) 22 - 32 mmol/L   Glucose, Bld 110 (H) 70 - 99 mg/dL   BUN 19 8 - 23 mg/dL   Creatinine 1.61 0.96 - 1.00 mg/dL   Calcium  10.6 (H) 8.9 - 10.3 mg/dL   Total Protein 8.6 (H) 6.5 - 8.1 g/dL   Albumin 4.1 3.5 - 5.0 g/dL   AST 35 15 - 41 U/L   ALT 49 (H) 0 - 44 U/L   Alkaline Phosphatase 176 (H) 38 - 126 U/L   Total Bilirubin 0.5 0.0 - 1.2 mg/dL   GFR, Estimated >04 >54 mL/min   Anion gap 20 (H) 5 - 15  CBC with Differential (Cancer Center Only)  Result Value Ref Range   WBC Count 7.0 4.0 - 10.5 K/uL   RBC 4.59 3.87 - 5.11 MIL/uL   Hemoglobin 12.9 12.0 - 15.0 g/dL   HCT 09.8 11.9 - 14.7 %   MCV 87.1 80.0 - 100.0 fL   MCH 28.1 26.0 - 34.0 pg   MCHC 32.3 30.0 - 36.0 g/dL   RDW 82.9 56.2 - 13.0 %   Platelet Count 318 150 - 400 K/uL   nRBC 0.0 0.0 - 0.2 %   Neutrophils Relative % 68 %   Neutro Abs 4.7 1.7 - 7.7 K/uL   Lymphocytes Relative 23 %   Lymphs Abs 1.6 0.7 - 4.0 K/uL   Monocytes Relative 7 %   Monocytes Absolute 0.5 0.1 - 1.0 K/uL   Eosinophils Relative 1 %   Eosinophils Absolute 0.1 0.0 - 0.5 K/uL   Basophils Relative 1 %   Basophils Absolute 0.0 0.0 - 0.1 K/uL   Immature Granulocytes 0 %   Abs Immature Granulocytes 0.03 0.00 - 0.07 K/uL     RADIOGRAPHIC STUDIES:  I have personally reviewed the radiological images as listed and agreed with the findings in the report.  CT ELBOW RIGHT WO CONTRAST Result Date: 05/15/2024 CLINICAL DATA:  Right elbow pain EXAM: CT OF THE UPPER RIGHT EXTREMITY WITHOUT CONTRAST TECHNIQUE: Multidetector CT imaging of the upper right extremity was performed according to the standard protocol. RADIATION DOSE REDUCTION: This exam was performed according to the departmental dose-optimization program which includes automated exposure control, adjustment of the mA and/or kV according to patient size and/or use of iterative reconstruction technique. COMPARISON:  X-ray right elbow 05/12/2024 FINDINGS: Bones/Joint/Cartilage No evidence of fracture,  dislocation, or joint effusion. No evidence of severe arthropathy. No aggressive appearing focal bone abnormality. Ligaments Suboptimally assessed by CT. Muscles and Tendons Grossly unremarkable. Soft tissues Unremarkable. IMPRESSION: No acute displaced fracture or dislocation. Electronically Signed   By: Morgane  Naveau M.D.   On: 05/15/2024 12:23   VAS US  LOWER EXTREMITY VENOUS (DVT) Result Date: 05/13/2024  Lower Venous DVT Study Patient Name:  [name]  Date of Exam:   05/13/2024 Medical Rec #: [id number]         Accession #:    [id number] Date of Birth: 04-26-1941          Patient Gender: F Patient Age:   89 years Exam Location:  [address] Procedure:      VAS US  LOWER EXTREMITY VENOUS (DVT) Referring Phys: Runell Countryman --------------------------------------------------------------------------------  Indications: Swelling, Post-op, and Extensive  DVT Left Pop-LCIV.  Risk Factors: Surgery 04/04/24 Mechanical Thrombectomy LLE. Comparison Study: 05/11/24 IVC/Iliac Duplex                   IVC/ Iliac: There is no evidence of thrombus involving the                   IVC. There is no evidence of thrombus involving the right                   common iliac vein. There is no evidence of thrombus involving                   the right external iliac vein.                    Left: There appears to be thrombus in the externail iliac vein                   mid to distal segment. Unable to visualized the left common                   iliac vein. No color flow noted in the distal common femoral                   vein, proximal femoral vein and profunda suggestive of                   possible acute DVT.                    NOTE: This was a technically difficult and limited exam.                    04/04/24                   Procedure Performed:                    1. Ultrasound-guided access of left popliteal vein                   2. Intravascular ultrasound of left popliteal, femoral, common                    femoral veins                   3. Intravascular ultrasound of the left external iliac, common                   iliac vein and IVC                   4. Mechanical thrombectomy of left common iliac, external                   iliac, common femoral, femoral and popliteal veins                   5. Left leg venogram                   6. 43 minutes moderate sedation with fentanyl  and Versed  Performing Technologist: Jenifer Miu RVT  Examination Guidelines: A complete evaluation includes B-mode imaging, spectral Doppler, color Doppler, and power Doppler as needed of all accessible portions of each vessel. Bilateral testing is considered an integral part of a complete examination. Limited examinations for reoccurring indications may be performed as noted. The reflux  portion of the exam is performed with the patient in reverse Trendelenburg.  +-----+---------------+---------+-----------+----------+--------------+ RIGHTCompressibilityPhasicitySpontaneityPropertiesThrombus Aging +-----+---------------+---------+-----------+----------+--------------+ CFV  Full           Yes      Yes        patent                   +-----+---------------+---------+-----------+----------+--------------+  +---------+---------------+---------+-----------+-----------------+------------+ LEFT     CompressibilityPhasicitySpontaneityProperties       Thrombus                                                                  Aging        +---------+---------------+---------+-----------+-----------------+------------+ CFV      Partial        No       No         Occlusive        Acute        +---------+---------------+---------+-----------+-----------------+------------+ SFJ      None                               non-occlusive    Acute        +---------+---------------+---------+-----------+-----------------+------------+ FV Prox  None                               Occlusive        Acute         +---------+---------------+---------+-----------+-----------------+------------+ FV Mid   None                               Occlusive        Acute        +---------+---------------+---------+-----------+-----------------+------------+ FV DistalNone                               Occlusive        Acute        +---------+---------------+---------+-----------+-----------------+------------+ PFV      Partial                            Occlusive        Acute        +---------+---------------+---------+-----------+-----------------+------------+ POP      None                               Occlusive        Acute        +---------+---------------+---------+-----------+-----------------+------------+ PTV      Partial                            1 patent, 1      Acute  occlusive                     +---------+---------------+---------+-----------+-----------------+------------+ PERO     Partial                            1 patent, 1      Acute                                                    occlusive                     +---------+---------------+---------+-----------+-----------------+------------+ Gastroc  Full                                                             +---------+---------------+---------+-----------+-----------------+------------+ GSV      Full                               patent                        +---------+---------------+---------+-----------+-----------------+------------+ SSV      Full                               patent                        +---------+---------------+---------+-----------+-----------------+------------+  Summary: RIGHT: No evidence of common femoral vein obstruction.  LEFT: Acute, occlusive deep vein thrombosis within the common femoral, femoral, popliteal, posterior tibial and peroneal veins. The great and small saphenous veins appear patent. A  cystic structure is found in the popliteal fossa measuring 2.97 x 0.83 cm.  *See table(s) above for measurements and observations. Electronically signed by Delaney Fearing on 05/13/2024 at 8:39:02 AM.    Final    VAS US  IVC/ILIAC (VENOUS ONLY) Result Date: 05/11/2024 IVC/ILIAC STUDY Patient Name:  [name]  Date of Exam:   05/11/2024 Medical Rec #: [id number]         Accession #:    [id number] Date of Birth: 03/05/1941          Patient Gender: F Patient Age:   45 years Exam Location:  [address] Procedure:      VAS US  IVC/ILIAC (VENOUS ONLY) Referring Phys: Runell Countryman --------------------------------------------------------------------------------  Vascular Interventions: 04/04/2024 Mechanical thrombectomy of the left CFV, EIV ,                         CIV , IVC . Limitations: Air/bowel gas and breathing artifact. PATIENT NOT NPO. Patient unable to be properly positioned.  Comparison Study: No prior exam. Performing Technologist: Homer Lust RVT  Examination Guidelines: A complete evaluation includes B-mode imaging, spectral Doppler, color Doppler, and power Doppler as needed of all accessible portions of each vessel. Bilateral testing is considered an integral part of a complete examination. Limited examinations for reoccurring indications may be performed as noted.  IVC/Iliac Findings: +----------+------+--------+------------------------------+  IVC    PatentThrombus           Comments            +----------+------+--------+------------------------------+ IVC Prox  patent        continuous, non pulsatile flow +----------+------+--------+------------------------------+ IVC Mid   patent        continuous, non pulsatile flow +----------+------+--------+------------------------------+ IVC Distalpatent        continuous, non pulsatile flow +----------+------+--------+------------------------------+  +-------------------+---------+-----------+---------+-----------+--------+         CIV         RT-PatentRT-ThrombusLT-PatentLT-ThrombusComments +-------------------+---------+-----------+---------+-----------+--------+ Common Iliac Prox   patent                                          +-------------------+---------+-----------+---------+-----------+--------+ Common Iliac Mid    patent                                          +-------------------+---------+-----------+---------+-----------+--------+ Common Iliac Distal patent                                          +-------------------+---------+-----------+---------+-----------+--------+  +--------------------+---------+-----------+---------+----------------+--------+         EIV         RT-PatentRT-ThrombusLT-Patent  LT-Thrombus   Comments +--------------------+---------+-----------+---------+----------------+--------+ External Iliac Vein  patent                                               Prox                                                                      +--------------------+---------+-----------+---------+----------------+--------+ External Iliac Vein  patent                      age undetermined         Mid                                                                       +--------------------+---------+-----------+---------+----------------+--------+ External Iliac Vein  patent                      age undetermined         Distal                                                                    +--------------------+---------+-----------+---------+----------------+--------+  Summary: IVC/Iliac: There is no evidence of thrombus involving the IVC. There is no evidence of thrombus involving the right common iliac vein. There is no evidence of thrombus involving the right external iliac vein. Left: There appears to be thrombus in the externail iliac vein mid to distal segment. Unable to visualized the left common iliac vein. No color flow noted in the distal  common femoral vein, proximal femoral vein and profunda suggestive of possible acute DVT. NOTE: This was a technically difficult and limited exam.  *See table(s) above for measurements and observations.  Electronically signed by Runell Countryman on 05/11/2024 at 4:10:20 PM.    Final     Orders Placed This Encounter  Procedures   CBC with Differential (Cancer Center Only)    Standing Status:   Future    Number of Occurrences:   1    Expiration Date:   05/23/2025   CMP (Cancer Center only)    Standing Status:   Future    Number of Occurrences:   1    Expiration Date:   05/23/2025   Lactate dehydrogenase    Standing Status:   Future    Number of Occurrences:   1    Expiration Date:   05/23/2025   D-dimer, quantitative    Standing Status:   Future    Number of Occurrences:   1    Expiration Date:   05/23/2025   Prothrombin gene mutation    Standing Status:   Future    Number of Occurrences:   1    Expiration Date:   05/23/2025   Factor 5 leiden    Standing Status:   Future    Number of Occurrences:   1    Expiration Date:   05/23/2025   Lupus anticoagulant panel    Standing Status:   Future    Number of Occurrences:   1    Expiration Date:   05/23/2025   Cardiolipin antibodies, IgG, IgM, IgA    Standing Status:   Future    Number of Occurrences:   1    Expiration Date:   05/23/2025   Beta-2 -glycoprotein i abs, IgG/M/A    Standing Status:   Future    Number of Occurrences:   1    Expiration Date:   05/23/2025   Protein C activity    Standing Status:   Future    Number of Occurrences:   1    Expiration Date:   05/23/2025   PROTEIN S PANEL    Standing Status:   Future    Number of Occurrences:   1    Expiration Date:   05/23/2025   Antithrombin panel    Standing Status:   Future    Number of Occurrences:   1    Expiration Date:   05/23/2025    Future Appointments  Date Time Provider Department Center  06/06/2024  3:30 PM Nahuel Wilbert, Gale Jude, MD [facility] None  06/21/2024  9:00 AM [facility]  H&V  06/21/2024 10:00 AM [facility] H&V  06/21/2024 10:30 AM [facility] H&V  08/23/2024 10:30 AM [facility] PHLEBOTOMIST [facility] None  08/23/2024 10:45 AM Suhayla Chisom, MD [facility] None    I spent a total of 55 minutes during this encounter with the patient including review of chart and various tests results, discussions about plan of care and coordination of care plan.  This document was completed utilizing speech recognition software. Grammatical errors, random word insertions, pronoun errors, and  incomplete sentences are an occasional consequence of this system due to software limitations, ambient noise, and hardware issues. Any formal questions or concerns about the content, text or information contained within the body of this dictation should be directly addressed to the provider for clarification.

## 2024-05-24 LAB — BETA-2-GLYCOPROTEIN I ABS, IGG/M/A
Beta-2 Glyco I IgG: <9 GPI IgG units (ref 0–20)
Beta-2-Glycoprotein I IgA: <9 GPI IgA units (ref 0–25)
Beta-2-Glycoprotein I IgM: <9 GPI IgM units (ref 0–32)

## 2024-05-24 LAB — CARDIOLIPIN ANTIBODIES, IGG, IGM, IGA
Anticardiolipin IgA: <9 U/mL (ref 0–11)
Anticardiolipin IgG: <9 GPL U/mL (ref 0–14)
Anticardiolipin IgM: 11 [MPL'U]/mL (ref 0–12)

## 2024-05-26 LAB — LUPUS ANTICOAGULANT PANEL
DRVVT: 40.9 s (ref 0.0–47.0)
PTT Lupus Anticoagulant: 35.4 s (ref 0.0–43.5)

## 2024-05-26 LAB — PROTEIN S PANEL
Protein S Activity: 67 % (ref 63–140)
Protein S Ag, Free: 128 % (ref 61–136)
Protein S Ag, Total: 182 % — ABNORMAL HIGH (ref 60–150)

## 2024-05-26 LAB — ANTITHROMBIN PANEL
AT III AG PPP IMM-ACNC: 93 % (ref 72–124)
Antithrombin Activity: 113 % (ref 75–135)

## 2024-05-26 LAB — PROTEIN C ACTIVITY: Protein C Activity: 134 % (ref 73–180)

## 2024-05-27 ENCOUNTER — Telehealth: Payer: Self-pay

## 2024-05-27 NOTE — Telephone Encounter (Signed)
 Will, OT with [facility] [phone]) left message on Triage line asking to move pt's discharge evaluation to the week of 05/30/2024.  Verbal order given to move pt's discharge evaluation to the week of 05/30/2024.

## 2024-05-30 LAB — FACTOR 5 LEIDEN

## 2024-05-31 ENCOUNTER — Encounter: Payer: Self-pay | Admitting: Oncology

## 2024-05-31 ENCOUNTER — Telehealth: Payer: Self-pay

## 2024-05-31 NOTE — Telephone Encounter (Signed)
 Home Health: Verbal orders for Charissee, HHPT w/ [facility] : Week 1 x 1 visit, 2 weeks x 1 visit.

## 2024-05-31 NOTE — Assessment & Plan Note (Addendum)
 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 Currently on enoxaparin  injections, 100 mg once daily. Swelling and pain have been intermittent.   Concerns about potential absorption issues with oral anticoagulants. No recent surgeries, prolonged immobility, or long trips contributing to clot formation. Weight loss noted without significant changes in appetite or activity level.   We will pursue thrombophilia workup with prothrombin gene mutation, factor V Leiden mutation, anticardiolipin antibodies, beta-2  glycoprotein antibodies, lupus anticoagulant, protein C activity, protein S activity, Antithrombin III activity.   Blood thinners need continuation for at least three months, with re-evaluation at that point. Alternative oral anticoagulants such as Xarelto or Pradaxa may be considered if ultrasound shows good response.  - Continue enoxaparin  injections, 100 mg once daily, for three months from diagnosis.  - Schedule follow-up ultrasound at the end of the month to assess clot resolution.  - Consider switching to an alternative oral anticoagulant such as Xarelto or Pradaxa if ultrasound shows good response.  I will discuss results of hypercoagulable workup over the phone in 2 weeks.  - Re-evaluate anticoagulation therapy in three months.

## 2024-06-06 ENCOUNTER — Encounter: Payer: Self-pay | Admitting: Oncology

## 2024-06-06 ENCOUNTER — Inpatient Hospital Stay (HOSPITAL_BASED_OUTPATIENT_CLINIC_OR_DEPARTMENT_OTHER): Admitting: Oncology

## 2024-06-06 DIAGNOSIS — I82412 Acute embolism and thrombosis of left femoral vein: Secondary | ICD-10-CM | POA: Diagnosis not present

## 2024-06-06 DIAGNOSIS — D6851 Activated protein C resistance: Secondary | ICD-10-CM | POA: Diagnosis not present

## 2024-06-06 MED ORDER — ENOXAPARIN SODIUM 100 MG/ML IJ SOSY: 100.0000 mg | PREFILLED_SYRINGE | INTRAMUSCULAR | 2 refills | Status: DC

## 2024-06-06 NOTE — Progress Notes (Signed)
 [facility]  HEMATOLOGY-ONCOLOGY ELECTRONIC VISIT PROGRESS NOTE  PATIENT NAME: [name]   MR#: [id number] DOB: 02-08-1941  DATE OF SERVICE: 06/06/2024  Patient Care Team: Marvene Prentice SAUNDERS, FNP as PCP - General (Family Medicine) Estelle Service, MD as Consulting Physician (Obstetrics and Gynecology) Magda Debby SAILOR, MD as Consulting Physician (Vascular Surgery)  I connected with the patient via telephone conference and verified that I am speaking with the correct person using two identifiers. The patient's location is at home and I am providing care from the [facility].  I discussed the limitations, risks, security and privacy concerns of performing an evaluation and management service by e-visits and the availability of in person appointments. I also discussed with the patient that there may be a patient responsible charge related to this service. The patient expressed understanding and agreed to proceed.   ASSESSMENT & PLAN:   [name] is a 83 y.o. lady with a past medical history of hypertension, dementia, was referred to our service for evaluation after she was diagnosed with acute DVT of the left lower extremity in April 2025.   No problem-specific Assessment & Plan notes found for this encounter.   Assessment and Plan Assessment & Plan       I discussed the assessment and treatment plan with the patient. The patient was provided an opportunity to ask questions and all were answered. The patient agreed with the plan and demonstrated an understanding of the instructions. The patient was advised to call back or seek an in-person evaluation if the symptoms worsen or if the condition fails to improve as anticipated.    I spent 12 minutes over the phone with the patient reviewing test results, discuss management and coordination/planning of care.  Chinita Patten, MD 06/06/2024 3:32 PM [facility] [facility] [place] [facility] A DEPT  OF [facility] [address] [place] [address] Dept: [phone] Dept Fax: [phone]   INTERVAL HISTORY:  Please see above for problem oriented charting.  The purpose of today's discussion is to explain recent lab results and to formulate plan of care.  Discussed the use of AI scribe software for clinical note transcription with the patient, who gave verbal consent to proceed.  History of Present Illness      ***  SUMMARY OF ONCOLOGIC HISTORY *** HEMATOLOGY HISTORY:  Oncology History   No history exists.    REVIEW OF SYSTEMS:    Review of Systems - Oncology  All other pertinent systems were reviewed with the patient and are negative.  I have reviewed the past medical history, past surgical history, social history and family history with the patient and they are unchanged from previous note.  ALLERGIES:  She is allergic to levaquin  [levofloxacin  in d5w].  MEDICATIONS:  Current Outpatient Medications  Medication Sig Dispense Refill   acetaminophen  (TYLENOL ) 500 MG tablet Take 1 tablet (500 mg total) by mouth every 8 (eight) hours as needed. 15 tablet 0   enoxaparin  (LOVENOX ) 100 MG/ML injection Inject 1 mL (100 mg total) into the skin daily. 30 mL 1   lactose free nutrition (BOOST) LIQD Take 237 mLs by mouth 3 (three) times daily between meals.     metoprolol  succinate (TOPROL -XL) 50 MG 24 hr tablet Take 50 mg by mouth daily.     omeprazole (PRILOSEC) 40 MG capsule Take 1 capsule by mouth daily.     Pedialyte (PEDIALYTE) SOLN Take 240 mLs by mouth  daily.     rosuvastatin  (CRESTOR ) 10 MG tablet Take 10 mg by mouth daily.     senna-docusate (SENOKOT-S) 8.6-50 MG tablet Take 1 tablet by mouth 2 (two) times daily between meals as needed for mild constipation.     venlafaxine  XR (EFFEXOR -XR) 75 MG 24 hr capsule TAKE ONE CAPSULE BY MOUTH ONCE DAILY WITH  BREAKFAST 90 capsule 3   No current facility-administered medications for this  visit.    PHYSICAL EXAMINATION:  ***   LABORATORY DATA:   I have reviewed the data as listed.    RADIOGRAPHIC STUDIES:  I have personally reviewed the radiological images as listed and agree with the findings in the report.  CT ELBOW RIGHT WO CONTRAST Result Date: 05/15/2024 CLINICAL DATA:  Right elbow pain EXAM: CT OF THE UPPER RIGHT EXTREMITY WITHOUT CONTRAST TECHNIQUE: Multidetector CT imaging of the upper right extremity was performed according to the standard protocol. RADIATION DOSE REDUCTION: This exam was performed according to the departmental dose-optimization program which includes automated exposure control, adjustment of the mA and/or kV according to patient size and/or use of iterative reconstruction technique. COMPARISON:  X-ray right elbow 05/12/2024 FINDINGS: Bones/Joint/Cartilage No evidence of fracture, dislocation, or joint effusion. No evidence of severe arthropathy. No aggressive appearing focal bone abnormality. Ligaments Suboptimally assessed by CT. Muscles and Tendons Grossly unremarkable. Soft tissues Unremarkable. IMPRESSION: No acute displaced fracture or dislocation. Electronically Signed   By: Morgane  Naveau M.D.   On: 05/15/2024 12:23   VAS US  LOWER EXTREMITY VENOUS (DVT) Result Date: 05/13/2024  Lower Venous DVT Study Patient Name:  [name]  Date of Exam:   05/13/2024 Medical Rec #: [id number]         Accession #:    [id number] Date of Birth: 12/22/1940          Patient Gender: F Patient Age:   83 years Exam Location:  [address] Procedure:      VAS US  LOWER EXTREMITY VENOUS (DVT) Referring Phys: DEBBY ROBERTSON --------------------------------------------------------------------------------  Indications: Swelling, Post-op, and Extensive DVT Left Pop-LCIV.  Risk Factors: Surgery 04/04/24 Mechanical Thrombectomy LLE. Comparison Study: 05/11/24 IVC/Iliac Duplex                   IVC/ Iliac: There is no evidence of thrombus involving the                    IVC. There is no evidence of thrombus involving the right                   common iliac vein. There is no evidence of thrombus involving                   the right external iliac vein.                    Left: There appears to be thrombus in the externail iliac vein                   mid to distal segment. Unable to visualized the left common                   iliac vein. No color flow noted in the distal common femoral                   vein, proximal femoral vein and profunda suggestive of  possible acute DVT.                    NOTE: This was a technically difficult and limited exam.                    04/04/24                   Procedure Performed:                    1. Ultrasound-guided access of left popliteal vein                   2. Intravascular ultrasound of left popliteal, femoral, common                   femoral veins                   3. Intravascular ultrasound of the left external iliac, common                   iliac vein and IVC                   4. Mechanical thrombectomy of left common iliac, external                   iliac, common femoral, femoral and popliteal veins                   5. Left leg venogram                   6. 43 minutes moderate sedation with fentanyl  and Versed  Performing Technologist: Camellia Bolder RVT  Examination Guidelines: A complete evaluation includes B-mode imaging, spectral Doppler, color Doppler, and power Doppler as needed of all accessible portions of each vessel. Bilateral testing is considered an integral part of a complete examination. Limited examinations for reoccurring indications may be performed as noted. The reflux portion of the exam is performed with the patient in reverse Trendelenburg.  +-----+---------------+---------+-----------+----------+--------------+ RIGHTCompressibilityPhasicitySpontaneityPropertiesThrombus Aging +-----+---------------+---------+-----------+----------+--------------+ CFV  Full           Yes      Yes         patent                   +-----+---------------+---------+-----------+----------+--------------+  +---------+---------------+---------+-----------+-----------------+------------+ LEFT     CompressibilityPhasicitySpontaneityProperties       Thrombus                                                                  Aging        +---------+---------------+---------+-----------+-----------------+------------+ CFV      Partial        No       No         Occlusive        Acute        +---------+---------------+---------+-----------+-----------------+------------+ SFJ      None                               non-occlusive    Acute        +---------+---------------+---------+-----------+-----------------+------------+ FV Prox  None  Occlusive        Acute        +---------+---------------+---------+-----------+-----------------+------------+ FV Mid   None                               Occlusive        Acute        +---------+---------------+---------+-----------+-----------------+------------+ FV DistalNone                               Occlusive        Acute        +---------+---------------+---------+-----------+-----------------+------------+ PFV      Partial                            Occlusive        Acute        +---------+---------------+---------+-----------+-----------------+------------+ POP      None                               Occlusive        Acute        +---------+---------------+---------+-----------+-----------------+------------+ PTV      Partial                            1 patent, 1      Acute                                                    occlusive                     +---------+---------------+---------+-----------+-----------------+------------+ PERO     Partial                            1 patent, 1      Acute                                                    occlusive                      +---------+---------------+---------+-----------+-----------------+------------+ Gastroc  Full                                                             +---------+---------------+---------+-----------+-----------------+------------+ GSV      Full                               patent                        +---------+---------------+---------+-----------+-----------------+------------+ SSV      Full  patent                        +---------+---------------+---------+-----------+-----------------+------------+  Summary: RIGHT: No evidence of common femoral vein obstruction.  LEFT: Acute, occlusive deep vein thrombosis within the common femoral, femoral, popliteal, posterior tibial and peroneal veins. The great and small saphenous veins appear patent. A cystic structure is found in the popliteal fossa measuring 2.97 x 0.83 cm.  *See table(s) above for measurements and observations. Electronically signed by Norman Serve on 05/13/2024 at 8:39:02 AM.    Final    VAS US  IVC/ILIAC (VENOUS ONLY) Result Date: 05/11/2024 IVC/ILIAC STUDY Patient Name:  [name]  Date of Exam:   05/11/2024 Medical Rec #: [id number]         Accession #:    [id number] Date of Birth: 08-08-1941          Patient Gender: F Patient Age:   54 years Exam Location:  [address] Procedure:      VAS US  IVC/ILIAC (VENOUS ONLY) Referring Phys: DEBBY ROBERTSON --------------------------------------------------------------------------------  Vascular Interventions: 04/04/2024 Mechanical thrombectomy of the left CFV, EIV ,                         CIV , IVC . Limitations: Air/bowel gas and breathing artifact. PATIENT NOT NPO. Patient unable to be properly positioned.  Comparison Study: No prior exam. Performing Technologist: Devere Dark RVT  Examination Guidelines: A complete evaluation includes B-mode imaging, spectral Doppler, color Doppler, and power Doppler as needed of all  accessible portions of each vessel. Bilateral testing is considered an integral part of a complete examination. Limited examinations for reoccurring indications may be performed as noted.  IVC/Iliac Findings: +----------+------+--------+------------------------------+    IVC    PatentThrombus           Comments            +----------+------+--------+------------------------------+ IVC Prox  patent        continuous, non pulsatile flow +----------+------+--------+------------------------------+ IVC Mid   patent        continuous, non pulsatile flow +----------+------+--------+------------------------------+ IVC Distalpatent        continuous, non pulsatile flow +----------+------+--------+------------------------------+  +-------------------+---------+-----------+---------+-----------+--------+         CIV        RT-PatentRT-ThrombusLT-PatentLT-ThrombusComments +-------------------+---------+-----------+---------+-----------+--------+ Common Iliac Prox   patent                                          +-------------------+---------+-----------+---------+-----------+--------+ Common Iliac Mid    patent                                          +-------------------+---------+-----------+---------+-----------+--------+ Common Iliac Distal patent                                          +-------------------+---------+-----------+---------+-----------+--------+  +--------------------+---------+-----------+---------+----------------+--------+         EIV         RT-PatentRT-ThrombusLT-Patent  LT-Thrombus   Comments +--------------------+---------+-----------+---------+----------------+--------+ External Iliac Vein  patent  Prox                                                                      +--------------------+---------+-----------+---------+----------------+--------+ External Iliac Vein  patent                       age undetermined         Mid                                                                       +--------------------+---------+-----------+---------+----------------+--------+ External Iliac Vein  patent                      age undetermined         Distal                                                                    +--------------------+---------+-----------+---------+----------------+--------+   Summary: IVC/Iliac: There is no evidence of thrombus involving the IVC. There is no evidence of thrombus involving the right common iliac vein. There is no evidence of thrombus involving the right external iliac vein. Left: There appears to be thrombus in the externail iliac vein mid to distal segment. Unable to visualized the left common iliac vein. No color flow noted in the distal common femoral vein, proximal femoral vein and profunda suggestive of possible acute DVT. NOTE: This was a technically difficult and limited exam.  *See table(s) above for measurements and observations.  Electronically signed by Debby Robertson on 05/11/2024 at 4:10:20 PM.    Final      No orders of the defined types were placed in this encounter.    Future Appointments  Date Time Provider Department Center  06/21/2024  9:00 AM [facility] H&V  06/21/2024 10:00 AM [facility] H&V  06/21/2024 10:30 AM [facility] H&V  08/23/2024 10:30 AM [facility] PHLEBOTOMIST [facility] None  08/23/2024 10:45 AM Elzada Pytel, Chinita, MD [facility] None    This document was completed utilizing speech recognition software. Grammatical errors, random word insertions, pronoun errors, and incomplete sentences are an occasional consequence of this system due to software limitations, ambient noise, and hardware issues. Any formal questions or concerns about the content, text or information contained within the body of this dictation should be directly addressed to the provider for clarification.

## 2024-06-07 ENCOUNTER — Encounter: Payer: Self-pay | Admitting: Oncology

## 2024-06-07 DIAGNOSIS — D6851 Activated protein C resistance: Secondary | ICD-10-CM | POA: Insufficient documentation

## 2024-06-07 NOTE — Assessment & Plan Note (Signed)
 On her consultation with us  on 05/23/2024, we pursued thrombophilia workup.  She was found to have heterozygosity for factor V Leiden mutation, which increases thromboembolism risk by 6-8 fold.  Rest of the hypercoagulable workup including prothrombin gene mutation, anticardiolipin antibodies, beta-2  glycoprotein antibodies, lupus anticoagulant, protein C activity, protein S activity, Antithrombin III  activity was all grossly unremarkable.   Given factor V Leiden heterozygous mutation, with a history of thromboembolism, recommendation is for indefinite anticoagulation.

## 2024-06-07 NOTE — Assessment & Plan Note (Signed)
 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 Currently on enoxaparin  injections, 100 mg once daily. Swelling and pain have been intermittent.   Concerns about potential absorption issues with oral anticoagulants. No recent surgeries, prolonged immobility, or long trips contributing to clot formation. Weight loss noted without significant changes in appetite or activity level.   On her consultation with us  on 05/23/2024, we pursued thrombophilia workup.  She was found to have heterozygosity for factor V Leiden mutation, which increases thromboembolism risk by 6-8 fold.  Rest of the hypercoagulable workup including prothrombin gene mutation, anticardiolipin antibodies, beta-2  glycoprotein antibodies, lupus anticoagulant, protein C activity, protein S activity, Antithrombin III  activity was all grossly unremarkable.  Given factor V Leiden heterozygous mutation, with a history of thromboembolism, recommendation is for indefinite anticoagulation.   She is scheduled for repeat ultrasound of the lower extremity on 06/21/2024.  If adequate response is noted, alternative oral anticoagulants such as Xarelto or Pradaxa may be considered for ease of administration.

## 2024-06-09 DIAGNOSIS — M79644 Pain in right finger(s): Secondary | ICD-10-CM | POA: Diagnosis not present

## 2024-06-09 DIAGNOSIS — M25511 Pain in right shoulder: Secondary | ICD-10-CM | POA: Diagnosis not present

## 2024-06-09 DIAGNOSIS — M25521 Pain in right elbow: Secondary | ICD-10-CM | POA: Diagnosis not present

## 2024-06-10 ENCOUNTER — Telehealth: Payer: Self-pay

## 2024-06-10 NOTE — Telephone Encounter (Signed)
 Charisse, PT from [facility] called requesting verbal orders to extend pt's PT  Verbal order given to extend pt's PT - 2x/week for 4 weeks.

## 2024-06-15 ENCOUNTER — Telehealth: Payer: Self-pay

## 2024-06-15 NOTE — Telephone Encounter (Signed)
 Pt's husband called [facility] Triage line asking for refill of pt's Lovenox .  Lovenox  prescription was written by Dr. Autumn on 06/06/24. Advised to call Dr. Clovis office to get Rx refilled.

## 2024-06-21 ENCOUNTER — Ambulatory Visit (HOSPITAL_BASED_OUTPATIENT_CLINIC_OR_DEPARTMENT_OTHER)
Admission: RE | Admit: 2024-06-21 | Discharge: 2024-06-21 | Disposition: A | Discharge status: Home / Self Care | Source: Ambulatory Visit | Attending: Vascular Surgery | Admitting: Vascular Surgery

## 2024-06-21 ENCOUNTER — Ambulatory Visit (HOSPITAL_COMMUNITY)
Admission: RE | Admit: 2024-06-21 | Discharge: 2024-06-21 | Disposition: A | Discharge status: Home / Self Care | Source: Ambulatory Visit | Attending: Vascular Surgery | Admitting: Vascular Surgery

## 2024-06-21 ENCOUNTER — Ambulatory Visit: Attending: Vascular Surgery | Admitting: Physician Assistant

## 2024-06-21 VITALS — BP 115/69 | HR 67 | Temp 97.3°F | Resp 18 | Ht 65.0 in | Wt 115.0 lb

## 2024-06-21 DIAGNOSIS — I82522 Chronic embolism and thrombosis of left iliac vein: Secondary | ICD-10-CM

## 2024-06-21 DIAGNOSIS — I82412 Acute embolism and thrombosis of left femoral vein: Secondary | ICD-10-CM | POA: Diagnosis not present

## 2024-06-21 NOTE — Progress Notes (Signed)
 Office Note   History of Present Illness   [name] is a 83 y.o. ([birth year]) female who presents for DVT follow up.  She recently underwent left lower extremity mechanical thrombectomy on 04/04/2024 by Dr. Pearline.  This was done for an extensive left iliofemoral DVT causing significant left lower extremity swelling.  She had good results after the thrombectomy.  She was discharged home on Eliquis .  She was seen in office on 5/30 with recurrent, minimal swelling in the left leg.  Duplex demonstrated recurrent, acute occlusive iliofemoral DVT extending into the tibial veins.  Her Eliquis  was discontinued and she was started on Lovenox  1 mg/kg/day.  She was also referred to hematology for further evaluation.  She returns today for follow-up.  She has been evaluated by hematology.  She was found to have heterozygosity for factor V Leiden mutation and was recommended indefinite anticoagulation.  Her hematologist noted if her follow-up duplex demonstrates adequate response to Lovenox , she could be considered for future anticoagulation with Xarelto or Pradaxa.  At follow-up today she is doing okay.  She continues to have very minimal swelling in the left leg.  She denies any pain in the left leg.  She has been compliant with her Lovenox  injections however she does not like getting them.  She is currently in a significant amount of pain around her right lower ribs.  She says that she was laying down on Thursday night when her dog jumped right on top of her chest.  Ever since then she has had very severe, unrelenting pain around her right lower ribs.  This pain worsens when she tries to take a deep breath or move.  She is planning on seeing her PCP or urgent care today.   Current Outpatient Medications  Medication Sig Dispense Refill   acetaminophen  (TYLENOL ) 500 MG tablet Take 1 tablet (500 mg total) by mouth every 8 (eight) hours as needed. 15 tablet 0   enoxaparin  (LOVENOX ) 100 MG/ML injection  Inject 1 mL (100 mg total) into the skin daily. 30 mL 2   lactose free nutrition (BOOST) LIQD Take 237 mLs by mouth 3 (three) times daily between meals.     metoprolol  succinate (TOPROL -XL) 50 MG 24 hr tablet Take 50 mg by mouth daily.     omeprazole (PRILOSEC) 40 MG capsule Take 1 capsule by mouth daily.     Pedialyte (PEDIALYTE) SOLN Take 240 mLs by mouth daily.     rosuvastatin  (CRESTOR ) 10 MG tablet Take 10 mg by mouth daily.     senna-docusate (SENOKOT-S) 8.6-50 MG tablet Take 1 tablet by mouth 2 (two) times daily between meals as needed for mild constipation.     venlafaxine  XR (EFFEXOR -XR) 75 MG 24 hr capsule TAKE ONE CAPSULE BY MOUTH ONCE DAILY WITH  BREAKFAST 90 capsule 3   No current facility-administered medications for this visit.    REVIEW OF SYSTEMS (negative unless checked):   Cardiac:  []  Chest pain or chest pressure? []  Shortness of breath upon activity? []  Shortness of breath when lying flat? []  Irregular heart rhythm?  Vascular:  []  Pain in calf, thigh, or hip brought on by walking? []  Pain in feet at night that wakes you up from your sleep? []  Blood clot in your veins? []  Leg swelling?  Pulmonary:  []  Oxygen at home? []  Productive cough? []  Wheezing?  Neurologic:  []  Sudden weakness in arms or legs? []  Sudden numbness in arms or legs? []  Sudden onset of difficult  speaking or slurred speech? []  Temporary loss of vision in one eye? []  Problems with dizziness?  Gastrointestinal:  []  Blood in stool? []  Vomited blood?  Genitourinary:  []  Burning when urinating? []  Blood in urine?  Psychiatric:  []  Major depression  Hematologic:  []  Bleeding problems? []  Problems with blood clotting?  Dermatologic:  []  Rashes or ulcers?  Constitutional:  []  Fever or chills?  Ear/Nose/Throat:  []  Change in hearing? []  Nose bleeds? []  Sore throat?  Musculoskeletal:  []  Back pain? [x]  Joint pain? []  Muscle pain?   Physical Examination   Vitals:    06/21/24 0949  BP: 115/69  Pulse: 67  Resp: 18  Temp: (!) 97.3 F (36.3 C)  TempSrc: Temporal  SpO2: 95%  Weight: 115 lb (52.2 kg)  Height: 5' 5 (1.651 m)   Body mass index is 19.14 kg/m.  General:  WDWN in NAD; visibly in pain Gait: Not observed HENT: WNL, normocephalic Pulmonary: normal non-labored breathing  Cardiac: Regular Abdomen: soft, NT, no masses Skin: without rashes Vascular Exam/Pulses: BLE warm and well-perfused Extremities: Very minimal edema of the left lower leg Musculoskeletal: no muscle wasting or atrophy  Neurologic: A&O X 3;  No focal weakness or paresthesias are detected Psychiatric:  The pt has Normal affect.  Non-Invasive Vascular imaging   LLE DVT study (06/21/2024) - Findings consistent with acute deep vein thrombosis involving the SF  junction, left femoral vein, left proximal profunda vein, left popliteal  vein, left posterior tibial veins, and left peroneal veins.    - Findings consistent with age indeterminate deep vein thrombosis  involving the left common femoral vein.   Left IVC/iliac vein duplex (06/21/2024) IVC/Iliac: There is no evidence of thrombus involving the IVC. There is  evidence of acute thrombus involving the left common iliac vein. There is  evidence of acute thrombus involving the left external iliac vein.    Medical Decision Making   [name] is a 83 y.o. female who presents for DVT follow-up  Based on left lower extremity duplex, there appears to be continued thrombus in the left femoral, popliteal, and tibial veins.  Duplex also demonstrates continued thrombus in the left common iliac and external iliac veins.  Since her duplex is unchanged from her last visit in May, her DVT is now considered chronic She continues to have very minimal edema in the left lower extremity.  She has no pain in the left leg.  She currently wears compression stockings and elevates her legs as needed. She has been compliant with her  change in anticoagulation therapy to Lovenox .  She seems to have an adequate response to her therapy given that her duplex and symptoms have not changed. She has also been evaluated by hematology who has discovered that the patient is heterozygous for factor V Leiden mutation.  She has been recommended to continue anticoagulation indefinitely. On exam her left lower extremity is well-perfused.  She has very little edema in the left lower leg. I have discussed this case with Dr. Pearline.  I have also explained to the patient that her duplex appears stable.  She does not warrant any intervention given that her symptoms are stable as well.  At this time we would like the patient to continue her Lovenox .  We will defer further anticoagulation preferences to hematology. She can follow-up with Dr. Pearline in 3 months with repeat left IVC/iliac vein duplex and left lower extremity DVT study   [facility] Leather Estis PA-C [facility] Office: [phone]  Clinic MD: Miguel

## 2024-06-22 ENCOUNTER — Other Ambulatory Visit: Payer: Self-pay

## 2024-06-22 DIAGNOSIS — I82522 Chronic embolism and thrombosis of left iliac vein: Secondary | ICD-10-CM

## 2024-06-23 DIAGNOSIS — R82998 Other abnormal findings in urine: Secondary | ICD-10-CM | POA: Diagnosis not present

## 2024-06-23 DIAGNOSIS — R1110 Vomiting, unspecified: Secondary | ICD-10-CM | POA: Diagnosis not present

## 2024-06-23 DIAGNOSIS — R0781 Pleurodynia: Secondary | ICD-10-CM | POA: Diagnosis not present

## 2024-06-24 DIAGNOSIS — R82998 Other abnormal findings in urine: Secondary | ICD-10-CM | POA: Diagnosis not present

## 2024-07-14 ENCOUNTER — Telehealth: Payer: Self-pay

## 2024-07-14 NOTE — Telephone Encounter (Signed)
 Pt's husband, Larnell, left message regarding blood in pt's stool.  Advised to call pt's PCP to discuss.

## 2024-08-10 DIAGNOSIS — R399 Unspecified symptoms and signs involving the genitourinary system: Secondary | ICD-10-CM | POA: Diagnosis not present

## 2024-08-10 DIAGNOSIS — R300 Dysuria: Secondary | ICD-10-CM | POA: Diagnosis not present

## 2024-08-23 ENCOUNTER — Inpatient Hospital Stay: Admitting: Oncology

## 2024-08-23 ENCOUNTER — Inpatient Hospital Stay: Attending: Oncology

## 2024-08-23 VITALS — BP 133/55 | HR 84 | Temp 98.7°F | Ht 65.0 in | Wt 130.7 lb

## 2024-08-23 DIAGNOSIS — D6851 Activated protein C resistance: Secondary | ICD-10-CM | POA: Diagnosis not present

## 2024-08-23 DIAGNOSIS — R634 Abnormal weight loss: Secondary | ICD-10-CM | POA: Insufficient documentation

## 2024-08-23 DIAGNOSIS — Z7901 Long term (current) use of anticoagulants: Secondary | ICD-10-CM | POA: Diagnosis not present

## 2024-08-23 DIAGNOSIS — F0390 Unspecified dementia without behavioral disturbance: Secondary | ICD-10-CM | POA: Insufficient documentation

## 2024-08-23 DIAGNOSIS — I82512 Chronic embolism and thrombosis of left femoral vein: Secondary | ICD-10-CM | POA: Diagnosis not present

## 2024-08-23 DIAGNOSIS — I82502 Chronic embolism and thrombosis of unspecified deep veins of left lower extremity: Secondary | ICD-10-CM | POA: Insufficient documentation

## 2024-08-23 DIAGNOSIS — I82412 Acute embolism and thrombosis of left femoral vein: Secondary | ICD-10-CM

## 2024-08-23 LAB — CBC WITH DIFFERENTIAL (CANCER CENTER ONLY)
Abs Immature Granulocytes: 0.01 K/uL (ref 0.00–0.07)
Basophils Absolute: 0 K/uL (ref 0.0–0.1)
Basophils Relative: 0 %
Eosinophils Absolute: 0.1 K/uL (ref 0.0–0.5)
Eosinophils Relative: 2 %
HCT: 41 % (ref 36.0–46.0)
Hemoglobin: 13.3 g/dL (ref 12.0–15.0)
Immature Granulocytes: 0 %
Lymphocytes Relative: 25 %
Lymphs Abs: 1.4 K/uL (ref 0.7–4.0)
MCH: 28.9 pg (ref 26.0–34.0)
MCHC: 32.4 g/dL (ref 30.0–36.0)
MCV: 88.9 fL (ref 80.0–100.0)
Monocytes Absolute: 0.4 K/uL (ref 0.1–1.0)
Monocytes Relative: 8 %
Neutro Abs: 3.7 K/uL (ref 1.7–7.7)
Neutrophils Relative %: 65 %
Platelet Count: 250 K/uL (ref 150–400)
RBC: 4.61 MIL/uL (ref 3.87–5.11)
RDW: 16.3 % — ABNORMAL HIGH (ref 11.5–15.5)
WBC Count: 5.7 K/uL (ref 4.0–10.5)
nRBC: 0 % (ref 0.0–0.2)

## 2024-08-23 LAB — D-DIMER, QUANTITATIVE: D-Dimer, Quant: 1.27 ug{FEU}/mL — ABNORMAL HIGH (ref 0.00–0.50)

## 2024-08-23 NOTE — Progress Notes (Signed)
 [facility]  HEMATOLOGY CLINIC PROGRESS NOTE  PATIENT NAME: [name]   MR#: [id number] DOB: 1941/02/16  Patient Care Team: Marvene Prentice SAUNDERS, FNP as PCP - General (Family Medicine) Estelle Service, MD as Consulting Physician (Obstetrics and Gynecology) Magda Debby SAILOR, MD as Consulting Physician (Vascular Surgery)  Date of visit: 08/23/2024   ASSESSMENT & PLAN:   ANGELICIA Oneal is a 83 y.o.  lady with a past medical history of hypertension, dementia, was referred to our service for evaluation after she was diagnosed with acute DVT of the left lower extremity in April 2025.  Grossly negative thrombophilia workup.  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 Currently on enoxaparin  injections, 100 mg once daily. Swelling and pain have been intermittent.   Concerns about potential absorption issues with oral anticoagulants. No recent surgeries, prolonged immobility, or long trips contributing to clot formation. Weight loss noted without significant changes in appetite or activity level.   On her consultation with us  on 05/23/2024, we pursued thrombophilia workup.  She was found to have heterozygosity for factor V Leiden mutation, which increases thromboembolism risk by 6-8 fold.  Rest of the hypercoagulable workup including prothrombin gene mutation, anticardiolipin antibodies, beta-2  glycoprotein antibodies, lupus anticoagulant, protein C activity, protein S activity, Antithrombin III  activity was all grossly unremarkable.  Given factor V Leiden heterozygous mutation, with a history of thromboembolism, recommendation made for indefinite anticoagulation.   Repeat ultrasound on 06/21/2024 showed what appeared to be acute DVT involving the SF junction, left femoral vein, left proximal profunda vein, left popliteal vein, left posterior tibial veins and left peroneal veins.   Findings consistent with age-indeterminate deep vein thrombosis in the left common femoral vein.  Essentially unchanged overall compared to previous examination.   D-dimer levels have improved from 2.08 in June to 1.27, indicating decreased clotting activity, though still above the normal range (<0.5).  Patient and caregiver opted to switch to Eliquis  again as she was not comfortable continuing Lovenox  injections.  We respect her wishes and switched treatments back to Eliquis  5 mg p.o. twice daily from 08/23/2024.  She is scheduled for repeat ultrasound on 09/30/2024, followed by evaluation with vascular surgery.  I will plan to see her in clinic following that.   I spent a total of 30 minutes during this encounter with the patient including review of chart and various tests results, discussions about plan of care and coordination of care plan.  I reviewed lab results and outside records for this visit and discussed relevant results with the patient. Diagnosis, plan of care and treatment options were also discussed in detail with the patient. Opportunity provided to ask questions and answers provided to her apparent satisfaction. Provided instructions to call our clinic with any problems, questions or concerns prior to return visit. I recommended to continue follow-up with PCP and sub-specialists. She verbalized understanding and agreed with the plan. No barriers to learning was detected.  Chinita Patten, MD  08/23/2024 11:32 AM  [facility] - A DEPT OF [facility] [address] [place] [address] Dept: [phone] Dept Fax: [phone]   CHIEF COMPLAINT/ REASON FOR VISIT:  Follow-up for history of DVT of the left lower extremity, diagnosed in April 2025.  INTERVAL HISTORY:  Discussed the use of AI scribe software for clinical note transcription with the patient, who gave verbal consent  to proceed.  History of  Present Illness [name] is an 83 year old female with a history of blood clots who presents for management of anticoagulation therapy. She is accompanied by a caregiver who assists with her medical care.  She has a history of blood clots and was previously on Eliquis  for one month. An ultrasound revealed one old clot and one new clot. She is scheduled for a follow-up ultrasound in three months.  She experiences persistent swelling in her legs. She manages the swelling by keeping her legs elevated in a recliner. No new pain is reported.  She is currently on Lomax injections, administered once or twice daily, but prefers oral medication over injections. She has two bottles of Eliquis , which she takes as a 5 mg tablet twice daily, at 7 AM and 7 PM.  Her D-dimer levels have improved from 2.08 in June to 1.27. No chest pain, trouble breathing, bleeding, or bruising. She has been eating well and has gained weight from 115 to 130 pounds.   SUMMARY OF HEMATOLOGIC HISTORY:  She was referred by Dr. Magda from the DVT clinic for management of her left leg clot.   On 04/01/2024, she was diagnosed with acute DVT involving the left common femoral vein, SF junction, left femoral vein, left proximal profunda vein, left popliteal vein, left posterior tibial veins, left peroneal veins, and EIV. Findings consistent with acute intramuscular thrombosis involving the left gastrocnemius veins.  On 04/04/2024, she underwent mechanical thrombectomy of the left common iliac, external iliac, common femoral, femoral and popliteal veins.   Patient had symptom relief after mechanical thrombectomy.  However she started having pain in the left lower extremity again around 05/12/2024.  Repeat ultrasound on 05/13/2024 showed acute, occlusive deep vein thrombosis within the common femoral, femoral, popliteal, posterior tibial and peroneal veins. The great and small saphenous veins appear patent.  There was a concern if she was  not responding to Eliquis /had absorption issues.  Hence other DOAC's were not chosen and patient was instead started on Lovenox  1 mg/kg subcutaneously daily and referral was sent to us  for further evaluation.   She has a history of a clot in her left leg, initially treated with Eliquis . Despite this treatment, she experienced a recurrence of symptoms, including swelling and pain, which led to a hospital stay where the clot was mechanically removed. Post-procedure, she was stable for over a month before symptoms returned.   Currently, she is on Lovenox  100 mg injections once daily, administered by her husband at 7 PM. 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 Her leg occasionally swells slightly.   Prior to the clot, she was fairly active and not bedbound. There were no recent surgeries, long road trips, or flights that could have contributed to the clot. Her caregiver attempted to manage her hydration with water and Pedialyte when symptoms first appeared.   She has experienced a weight loss of 15 pounds, now weighing around 115-116 pounds. Her caregiver ensures she eats throughout the day, providing small meals and snacks to maintain her weight. Despite this, her weight has remained stable at the lower level.   On her consultation with us  on 05/23/2024, we pursued thrombophilia workup.  She was found to have heterozygosity for factor V Leiden mutation, which increases thromboembolism risk by 6-8 fold.  Rest of the hypercoagulable workup including prothrombin gene mutation, anticardiolipin antibodies, beta-2  glycoprotein antibodies, lupus anticoagulant, protein C activity, protein S activity,  Antithrombin III  activity was all grossly unremarkable.   Given factor V Leiden heterozygous mutation, with a history of thromboembolism, recommendation is for indefinite anticoagulation.   Repeat ultrasound on 06/21/2024 showed what appeared to be acute DVT  involving the SF junction, left femoral vein, left proximal profunda vein, left popliteal vein, left posterior tibial veins and left peroneal veins.  Findings consistent with age-indeterminate deep vein thrombosis in the left common femoral vein.  Essentially unchanged overall compared to previous examination.  Patient and caregiver opted to switch to Eliquis  again as she was not comfortable continuing Lovenox  injections.  We respect her wishes and switched treatments back to Eliquis  5 mg p.o. twice daily from 08/23/2024.  I have reviewed the past medical history, past surgical history, social history and family history with the patient and they are unchanged from previous note.  ALLERGIES: She is allergic to levaquin  [levofloxacin  in d5w].  MEDICATIONS:  Current Outpatient Medications  Medication Sig Dispense Refill   acetaminophen  (TYLENOL ) 500 MG tablet Take 1 tablet (500 mg total) by mouth every 8 (eight) hours as needed. 15 tablet 0   enoxaparin  (LOVENOX ) 100 MG/ML injection Inject 1 mL (100 mg total) into the skin daily. 30 mL 2   lactose free nutrition (BOOST) LIQD Take 237 mLs by mouth 3 (three) times daily between meals.     metoprolol  succinate (TOPROL -XL) 50 MG 24 hr tablet Take 50 mg by mouth daily.     omeprazole (PRILOSEC) 40 MG capsule Take 1 capsule by mouth daily.     Pedialyte (PEDIALYTE) SOLN Take 240 mLs by mouth daily.     rosuvastatin  (CRESTOR ) 10 MG tablet Take 10 mg by mouth daily.     senna-docusate (SENOKOT-S) 8.6-50 MG tablet Take 1 tablet by mouth 2 (two) times daily between meals as needed for mild constipation.     venlafaxine  XR (EFFEXOR -XR) 75 MG 24 hr capsule TAKE ONE CAPSULE BY MOUTH ONCE DAILY WITH  BREAKFAST 90 capsule 3   No current facility-administered medications for this visit.     REVIEW OF SYSTEMS:    Review of Systems - Oncology  All other pertinent systems were reviewed with the patient and are negative.  PHYSICAL EXAMINATION:    Vitals:    08/23/24 1112  BP: (!) 133/55  Pulse: 84  Temp: 98.7 F (37.1 C)   Filed Weights   08/23/24 1112  Weight: 130 lb 11.2 oz (59.3 kg)    Physical Exam Constitutional:      General: She is not in acute distress.    Appearance: Normal appearance.  HENT:     Head: Normocephalic and atraumatic.  Cardiovascular:     Rate and Rhythm: Normal rate.  Pulmonary:     Effort: Pulmonary effort is normal. No respiratory distress.  Abdominal:     General: There is no distension.  Neurological:     General: No focal deficit present.     Mental Status: She is alert and oriented to person, place, and time.  Psychiatric:        Mood and Affect: Mood normal.        Behavior: Behavior normal.      LABORATORY DATA:   I have reviewed the data as listed.  Results for orders placed or performed in visit on 08/23/24  D-dimer, quantitative  Result Value Ref Range   D-Dimer, Quant 1.27 (H) 0.00 - 0.50 ug/mL-FEU  CBC with Differential (Cancer Center Only)  Result Value Ref Range   WBC Count 5.7  4.0 - 10.5 K/uL   RBC 4.61 3.87 - 5.11 MIL/uL   Hemoglobin 13.3 12.0 - 15.0 g/dL   HCT 58.9 63.9 - 53.9 %   MCV 88.9 80.0 - 100.0 fL   MCH 28.9 26.0 - 34.0 pg   MCHC 32.4 30.0 - 36.0 g/dL   RDW 83.6 (H) 88.4 - 84.4 %   Platelet Count 250 150 - 400 K/uL   nRBC 0.0 0.0 - 0.2 %   Neutrophils Relative % 65 %   Neutro Abs 3.7 1.7 - 7.7 K/uL   Lymphocytes Relative 25 %   Lymphs Abs 1.4 0.7 - 4.0 K/uL   Monocytes Relative 8 %   Monocytes Absolute 0.4 0.1 - 1.0 K/uL   Eosinophils Relative 2 %   Eosinophils Absolute 0.1 0.0 - 0.5 K/uL   Basophils Relative 0 %   Basophils Absolute 0.0 0.0 - 0.1 K/uL   Immature Granulocytes 0 %   Abs Immature Granulocytes 0.01 0.00 - 0.07 K/uL     RADIOGRAPHIC STUDIES:  No recent pertinent imaging studies available to review.  Orders Placed This Encounter  Procedures   CBC with Differential (Cancer Center Only)    Standing Status:   Future    Expected Date:    10/18/2024    Expiration Date:   01/16/2025   D-dimer, quantitative    Standing Status:   Future    Expected Date:   10/18/2024    Expiration Date:   01/16/2025     Future Appointments  Date Time Provider Department Center  09/30/2024  9:00 AM [facility]  09/30/2024 10:00 AM [facility]  09/30/2024 10:40 AM Pearline Norman RAMAN, MD [facility] H[facility]V  10/18/2024 11:15 AM [facility] PHLEBOTOMIST [facility] None  10/18/2024 11:45 AM Ozie Dimaria, Chinita, MD [facility] None     This document was completed utilizing speech recognition software. Grammatical errors, random word insertions, pronoun errors, and incomplete sentences are an occasional consequence of this system due to software limitations, ambient noise, and hardware issues. Any formal questions or concerns about the content, text or information contained within the body of this dictation should be directly addressed to the provider for clarification.

## 2024-08-24 ENCOUNTER — Telehealth: Payer: Self-pay | Admitting: Oncology

## 2024-08-24 NOTE — Telephone Encounter (Signed)
 Patient has been scheduled for follow-up visit per 08/24/24 LOS.  Pt aware of scheduled appt details.

## 2024-08-30 ENCOUNTER — Encounter: Payer: Self-pay | Admitting: Oncology

## 2024-08-30 MED ORDER — APIXABAN 5 MG PO TABS: 5.0000 mg | ORAL_TABLET | Freq: Two times a day (BID) | ORAL | 5 refills | Status: DC

## 2024-08-30 NOTE — Assessment & Plan Note (Addendum)
 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 Currently on enoxaparin  injections, 100 mg once daily. Swelling and pain have been intermittent.   Concerns about potential absorption issues with oral anticoagulants. No recent surgeries, prolonged immobility, or long trips contributing to clot formation. Weight loss noted without significant changes in appetite or activity level.   On her consultation with us  on 05/23/2024, we pursued thrombophilia workup.  She was found to have heterozygosity for factor V Leiden mutation, which increases thromboembolism risk by 6-8 fold.  Rest of the hypercoagulable workup including prothrombin gene mutation, anticardiolipin antibodies, beta-2  glycoprotein antibodies, lupus anticoagulant, protein C activity, protein S activity, Antithrombin III  activity was all grossly unremarkable.  Given factor V Leiden heterozygous mutation, with a history of thromboembolism, recommendation made for indefinite anticoagulation.   Repeat ultrasound on 06/21/2024 showed what appeared to be acute DVT involving the SF junction, left femoral vein, left proximal profunda vein, left popliteal vein, left posterior tibial veins and left peroneal veins.  Findings consistent with age-indeterminate deep vein thrombosis in the left common femoral vein.  Essentially unchanged overall compared to previous examination.   D-dimer levels have improved from 2.08 in June to 1.27, indicating decreased clotting activity, though still above the normal range (<0.5).  Patient and caregiver opted to switch to Eliquis  again as she was not comfortable continuing Lovenox  injections.  We respect her wishes and switched treatments back to Eliquis  5 mg p.o. twice daily from 08/23/2024.  She is scheduled for repeat ultrasound on 09/30/2024, followed by evaluation with vascular surgery.  I will plan to see her in clinic following that.

## 2024-09-07 ENCOUNTER — Telehealth: Payer: Self-pay

## 2024-09-07 DIAGNOSIS — N390 Urinary tract infection, site not specified: Secondary | ICD-10-CM | POA: Diagnosis not present

## 2024-09-07 NOTE — Telephone Encounter (Signed)
 Responded to patient's request to have a glass of wine. The following was relayed to patient's husband per Dr. Autumn per phone call:    Patient may have no more than two glasses of wine per week. Mr. Andy repeated the instructions and agreed.

## 2024-09-15 DIAGNOSIS — G43909 Migraine, unspecified, not intractable, without status migrainosus: Secondary | ICD-10-CM | POA: Diagnosis not present

## 2024-09-15 DIAGNOSIS — M8580 Other specified disorders of bone density and structure, unspecified site: Secondary | ICD-10-CM | POA: Diagnosis not present

## 2024-09-15 DIAGNOSIS — I10 Essential (primary) hypertension: Secondary | ICD-10-CM | POA: Diagnosis not present

## 2024-09-15 DIAGNOSIS — H35033 Hypertensive retinopathy, bilateral: Secondary | ICD-10-CM | POA: Diagnosis not present

## 2024-09-15 DIAGNOSIS — N390 Urinary tract infection, site not specified: Secondary | ICD-10-CM | POA: Diagnosis not present

## 2024-09-15 DIAGNOSIS — F0390 Unspecified dementia without behavioral disturbance: Secondary | ICD-10-CM | POA: Diagnosis not present

## 2024-09-15 DIAGNOSIS — N1831 Chronic kidney disease, stage 3a: Secondary | ICD-10-CM | POA: Diagnosis not present

## 2024-09-15 DIAGNOSIS — F3341 Major depressive disorder, recurrent, in partial remission: Secondary | ICD-10-CM | POA: Diagnosis not present

## 2024-09-15 DIAGNOSIS — K219 Gastro-esophageal reflux disease without esophagitis: Secondary | ICD-10-CM | POA: Diagnosis not present

## 2024-09-15 DIAGNOSIS — E782 Mixed hyperlipidemia: Secondary | ICD-10-CM | POA: Diagnosis not present

## 2024-09-15 DIAGNOSIS — E559 Vitamin D deficiency, unspecified: Secondary | ICD-10-CM | POA: Diagnosis not present

## 2024-09-15 DIAGNOSIS — Z0000 Encounter for general adult medical examination without abnormal findings: Secondary | ICD-10-CM | POA: Diagnosis not present

## 2024-09-20 DIAGNOSIS — J301 Allergic rhinitis due to pollen: Secondary | ICD-10-CM | POA: Diagnosis not present

## 2024-09-20 DIAGNOSIS — I10 Essential (primary) hypertension: Secondary | ICD-10-CM | POA: Diagnosis not present

## 2024-09-20 DIAGNOSIS — K219 Gastro-esophageal reflux disease without esophagitis: Secondary | ICD-10-CM | POA: Diagnosis not present

## 2024-09-20 DIAGNOSIS — H35033 Hypertensive retinopathy, bilateral: Secondary | ICD-10-CM | POA: Diagnosis not present

## 2024-09-20 DIAGNOSIS — N1831 Chronic kidney disease, stage 3a: Secondary | ICD-10-CM | POA: Diagnosis not present

## 2024-09-20 DIAGNOSIS — E782 Mixed hyperlipidemia: Secondary | ICD-10-CM | POA: Diagnosis not present

## 2024-09-20 DIAGNOSIS — G43909 Migraine, unspecified, not intractable, without status migrainosus: Secondary | ICD-10-CM | POA: Diagnosis not present

## 2024-09-20 DIAGNOSIS — E559 Vitamin D deficiency, unspecified: Secondary | ICD-10-CM | POA: Diagnosis not present

## 2024-09-20 DIAGNOSIS — M8580 Other specified disorders of bone density and structure, unspecified site: Secondary | ICD-10-CM | POA: Diagnosis not present

## 2024-09-30 ENCOUNTER — Ambulatory Visit: Admitting: Vascular Surgery

## 2024-09-30 ENCOUNTER — Ambulatory Visit (HOSPITAL_COMMUNITY)
Admission: RE | Admit: 2024-09-30 | Discharge: 2024-09-30 | Disposition: A | Discharge status: Home / Self Care | Source: Ambulatory Visit | Attending: Vascular Surgery | Admitting: Vascular Surgery

## 2024-09-30 ENCOUNTER — Ambulatory Visit (HOSPITAL_BASED_OUTPATIENT_CLINIC_OR_DEPARTMENT_OTHER)
Admission: RE | Admit: 2024-09-30 | Discharge: 2024-09-30 | Disposition: A | Discharge status: Home / Self Care | Source: Ambulatory Visit | Attending: Vascular Surgery | Admitting: Vascular Surgery

## 2024-09-30 DIAGNOSIS — I82522 Chronic embolism and thrombosis of left iliac vein: Secondary | ICD-10-CM | POA: Diagnosis not present

## 2024-10-06 NOTE — Progress Notes (Unsigned)
 Patient ID: [name], female   DOB: 12-Dec-1941, 83 y.o.   MRN: [id number]  Reason for Consult: No chief complaint on file.   Referred by Marvene Prentice SAUNDERS, FNP  Subjective:     HPI [name] is a 83 y.o. female presenting for DVT follow-up.  She underwent left lower extremity mechanical thrombectomy on 04/04/2024.  At that time she had extensive left iliofemoral DVT there was a good result after thrombectomy and she was discharged home on Eliquis .  She was seen back in office in May for recurrent but minimal left leg swelling.  The duplex demonstrated a preocclusive DVT of the left iliofemoral segment and tibial veins.  She was then started on Lovenox  and referred to hematology.  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 Today she reports ***  Past Medical History:  Diagnosis Date   Actinic keratosis 12/03/2012   Allergy     Anxiety    Effexor  helps--psychiatrist Dr Vincente   Arthritis    osteoarthritis, hands   Atypical chest pain    EF 86%, breast attenuation, no significant ischemia   Cataract    Cervical radiculopathy 11/18/2013   Chronic headache    takes Metoprolol  for this   Diverticulosis    Erosive esophagitis    GERD (gastroesophageal reflux disease) 11/2009   EGD: Dr. 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   Family History  Problem Relation Age of Onset   Heart disease Mother    Heart failure Mother    Breast cancer Mother 88   Prostate cancer Father    Inflammatory bowel disease Sister    Diabetes  Maternal Aunt    Colon cancer Paternal Grandfather    Stomach cancer Neg Hx    Pancreatic cancer Neg Hx    Esophageal cancer Neg Hx    Rectal cancer Neg Hx    Past Surgical History:  Procedure Laterality Date   AUGMENTATION MAMMAPLASTY Bilateral    silicone gel implants   Birth mark removed     CATARACT EXTRACTION Bilateral    COLONOSCOPY     COLPOSCOPY     laser vaporization of upper vagina  1998   LOWER EXTREMITY VENOGRAPHY  04/04/2024   Procedure: LOWER EXTREMITY VENOGRAPHY;  Surgeon: Pearline Norman RAMAN, MD;  Location: [facility];  Service: Cardiovascular;;   NM MYOCAR PERF WALL MOTION  01/24/2008   EF 86% neg ischemia   PERIPHERAL VASCULAR THROMBECTOMY Left 04/04/2024   Procedure: PERIPHERAL VASCULAR THROMBECTOMY;  Surgeon: Pearline Norman RAMAN, MD;  Location: [facility];  Service: Cardiovascular;  Laterality: Left;   VAGINAL HYSTERECTOMY  1978    Short Social History:  Social History   Tobacco Use   Smoking status: Former    Current packs/day: 0.00    Average packs/day: 1 pack/day for 10.0 years (10.0 ttl pk-yrs)    Types: Cigarettes    Start date: 12/15/1978    Quit date: 12/15/1988    Years since quitting: 35.8  Smokeless tobacco: Never   Tobacco comments:    quit in the 1980's  Substance Use Topics   Alcohol use: Not Currently    Alcohol/week: 14.0 standard drinks of alcohol    Types: 14 Standard drinks or equivalent per week    Comment: drinks wine every day; 2 drinks a day     Allergies  Allergen Reactions   Levaquin  [Levofloxacin  In D5w] Nausea Only    Dizziness and tremulousness    Current Outpatient Medications  Medication Sig Dispense Refill   apixaban  (ELIQUIS ) 5 MG TABS tablet Take 1 tablet (5 mg total) by mouth 2 (two) times daily. 60 tablet 5   lactose free nutrition (BOOST) LIQD Take 237 mLs by mouth 3 (three) times daily between meals.     metoprolol  succinate (TOPROL -XL) 50 MG 24 hr tablet Take 50 mg by mouth daily.     omeprazole  (PRILOSEC) 40 MG capsule Take 1 capsule by mouth daily. (Patient taking differently: Take 1 capsule by mouth daily. Takes PRN per pt report)     Pedialyte (PEDIALYTE) SOLN Take 240 mLs by mouth daily. (Patient taking differently: Take 240 mLs by mouth daily. Takes PRN per pt report)     rosuvastatin  (CRESTOR ) 10 MG tablet Take 10 mg by mouth daily.     senna-docusate (SENOKOT-S) 8.6-50 MG tablet Take 1 tablet by mouth 2 (two) times daily between meals as needed for mild constipation.     venlafaxine  XR (EFFEXOR -XR) 75 MG 24 hr capsule TAKE ONE CAPSULE BY MOUTH ONCE DAILY WITH  BREAKFAST 90 capsule 3   No current facility-administered medications for this visit.    REVIEW OF SYSTEMS  All other systems were reviewed and are negative     Objective:  Objective   There were no vitals filed for this visit. There is no height or weight on file to calculate BMI.  Physical Exam General: no acute distress Neuro: alert, no focal deficit Extremities: ***  Data: IVC/iliac duplex IVC/Iliac Findings:  +----------+------+--------+--------+    IVC    PatentThrombusComments  +----------+------+--------+--------+  IVC Prox  patent                  +----------+------+--------+--------+  IVC Mid   patent                  +----------+------+--------+--------+  IVC Distalpatent                  +----------+------+--------+--------+   +-------------------+---------+-----------+---------+--------------+-------  -+         CIV        RT-PatentRT-ThrombusLT-Patent LT-Thrombus   Comments  +-------------------+---------+-----------+---------+--------------+-------  -+  Common Iliac Prox                       patent  recanalization           +-------------------+---------+-----------+---------+--------------+-------  -+  Common Iliac Mid    patent              patent  recanalization            +-------------------+---------+-----------+---------+--------------+-------  -+  Common Iliac Distal                     patent  recanalization           +-------------------+---------+-----------+---------+--------------+-------  -+    +----------------------+---------+-----------+---------+--------------+----  ----+          EIV          RT-PatentRT-ThrombusLT-Patent LT-Thrombus  Comments  +----------------------+---------+-----------+---------+--------------+----  ----+  External Iliac Vein    patent              patent  recanalization           Mid                                                                         +----------------------+---------+-----------+---------+--------------+----  ----+    Summary: IVC/Iliac: There is no evidence of thrombus involving the IVC. 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 There  is evidence of chronic thrombus involving the left external iliac vein.    Left leg duplex +---------+---------------+---------+-----------+---------------+----------  ----+  LEFT    CompressibilityPhasicitySpontaneityProperties     Thrombus  Aging  +---------+---------------+---------+-----------+---------------+----------  ----+  CFV     Partial        Yes      Yes        softly         Chronic                                                      echogenic                       +---------+---------------+---------+-----------+---------------+----------  ----+  SFJ     Full                    Yes                                        +---------+---------------+---------+-----------+---------------+----------  ----+  FV Prox  None           No       No         retracted      Chronic          +---------+---------------+---------+-----------+---------------+----------  ----+  FV Mid   None           No       No         retracted      Chronic           +---------+---------------+---------+-----------+---------------+----------  ----+  FV DistalNone           No       No         retracted      Chronic          +---------+---------------+---------+-----------+---------------+----------  ----+  PFV     Partial        Yes      Yes        retracted      Chronic          +---------+---------------+---------+-----------+---------------+----------  ----+  POP     Partial        Yes      Yes        softly  Chronic                                                      echogenic                       +---------+---------------+---------+-----------+---------------+----------  ----+  PTV     Full                    Yes                                        +---------+---------------+---------+-----------+---------------+----------  ----+  PERO    Full                    Yes                                        +---------+---------------+---------+-----------+---------------+----------  ----+       Assessment/Plan:   SHANETA CERVENKA is a 83 y.o. female with acute left leg DVT who underwent thrombectomy back in April.  Now with reocclusion of the iliofemoral segment that is chronic at this point, the iliac system is demonstrating recanalization on duplex.  Her symptoms continue to be minimal at this time.  She was recently switched from Lovenox  to Eliquis  by hematology with plans to continue long-term anticoagulation therapy due to her factor V Leiden mutation. No need for further vascular intervention or follow-up at this time. Follow-up as needed    Norman GORMAN Serve MD [facility]

## 2024-10-07 ENCOUNTER — Ambulatory Visit: Attending: Vascular Surgery | Admitting: Vascular Surgery

## 2024-10-07 ENCOUNTER — Encounter: Payer: Self-pay | Admitting: Vascular Surgery

## 2024-10-07 VITALS — BP 105/66 | HR 73 | Temp 97.8°F | Ht 65.0 in | Wt 135.0 lb

## 2024-10-07 DIAGNOSIS — I82522 Chronic embolism and thrombosis of left iliac vein: Secondary | ICD-10-CM | POA: Diagnosis not present

## 2024-10-11 DIAGNOSIS — R399 Unspecified symptoms and signs involving the genitourinary system: Secondary | ICD-10-CM | POA: Diagnosis not present

## 2024-10-12 ENCOUNTER — Encounter: Payer: Self-pay | Admitting: *Deleted

## 2024-10-13 ENCOUNTER — Other Ambulatory Visit (HOSPITAL_BASED_OUTPATIENT_CLINIC_OR_DEPARTMENT_OTHER): Payer: Self-pay | Admitting: Family Medicine

## 2024-10-13 DIAGNOSIS — M8580 Other specified disorders of bone density and structure, unspecified site: Secondary | ICD-10-CM

## 2024-10-13 DIAGNOSIS — Z1231 Encounter for screening mammogram for malignant neoplasm of breast: Secondary | ICD-10-CM

## 2024-10-18 ENCOUNTER — Inpatient Hospital Stay: Admitting: Oncology

## 2024-10-18 ENCOUNTER — Inpatient Hospital Stay: Attending: Oncology

## 2024-10-18 ENCOUNTER — Encounter: Payer: Self-pay | Admitting: Oncology

## 2024-10-18 VITALS — BP 108/55 | HR 65 | Temp 97.3°F | Resp 18 | Wt 139.6 lb

## 2024-10-18 DIAGNOSIS — Z87891 Personal history of nicotine dependence: Secondary | ICD-10-CM | POA: Insufficient documentation

## 2024-10-18 DIAGNOSIS — I82412 Acute embolism and thrombosis of left femoral vein: Secondary | ICD-10-CM

## 2024-10-18 DIAGNOSIS — Z86718 Personal history of other venous thrombosis and embolism: Secondary | ICD-10-CM | POA: Insufficient documentation

## 2024-10-18 DIAGNOSIS — R634 Abnormal weight loss: Secondary | ICD-10-CM | POA: Diagnosis not present

## 2024-10-18 DIAGNOSIS — I10 Essential (primary) hypertension: Secondary | ICD-10-CM | POA: Diagnosis not present

## 2024-10-18 DIAGNOSIS — I82512 Chronic embolism and thrombosis of left femoral vein: Secondary | ICD-10-CM

## 2024-10-18 DIAGNOSIS — Z79899 Other long term (current) drug therapy: Secondary | ICD-10-CM | POA: Diagnosis not present

## 2024-10-18 DIAGNOSIS — R7989 Other specified abnormal findings of blood chemistry: Secondary | ICD-10-CM | POA: Diagnosis not present

## 2024-10-18 DIAGNOSIS — D6851 Activated protein C resistance: Secondary | ICD-10-CM | POA: Insufficient documentation

## 2024-10-18 DIAGNOSIS — Z7901 Long term (current) use of anticoagulants: Secondary | ICD-10-CM | POA: Diagnosis not present

## 2024-10-18 DIAGNOSIS — F0390 Unspecified dementia without behavioral disturbance: Secondary | ICD-10-CM | POA: Insufficient documentation

## 2024-10-18 LAB — CBC WITH DIFFERENTIAL (CANCER CENTER ONLY)
Abs Immature Granulocytes: 0.01 K/uL (ref 0.00–0.07)
Basophils Absolute: 0 K/uL (ref 0.0–0.1)
Basophils Relative: 0 %
Eosinophils Absolute: 0.2 K/uL (ref 0.0–0.5)
Eosinophils Relative: 3 %
HCT: 38.4 % (ref 36.0–46.0)
Hemoglobin: 12.3 g/dL (ref 12.0–15.0)
Immature Granulocytes: 0 %
Lymphocytes Relative: 32 %
Lymphs Abs: 1.8 K/uL (ref 0.7–4.0)
MCH: 29.1 pg (ref 26.0–34.0)
MCHC: 32 g/dL (ref 30.0–36.0)
MCV: 90.8 fL (ref 80.0–100.0)
Monocytes Absolute: 0.4 K/uL (ref 0.1–1.0)
Monocytes Relative: 7 %
Neutro Abs: 3.2 K/uL (ref 1.7–7.7)
Neutrophils Relative %: 58 %
Platelet Count: 203 K/uL (ref 150–400)
RBC: 4.23 MIL/uL (ref 3.87–5.11)
RDW: 15 % (ref 11.5–15.5)
WBC Count: 5.5 K/uL (ref 4.0–10.5)
nRBC: 0 % (ref 0.0–0.2)

## 2024-10-18 LAB — D-DIMER, QUANTITATIVE: D-Dimer, Quant: 1.7 ug{FEU}/mL — ABNORMAL HIGH (ref 0.00–0.50)

## 2024-10-18 NOTE — Progress Notes (Signed)
 "  [facility]  HEMATOLOGY CLINIC PROGRESS NOTE  PATIENT NAME: [name]   MR#: [id number] DOB: February 06, 1941  Patient Care Team: Marvene Prentice SAUNDERS, FNP as PCP - General (Family Medicine) Estelle Service, MD as Consulting Physician (Obstetrics and Gynecology) Magda Debby SAILOR, MD as Consulting Physician (Vascular Surgery)  Date of visit: 10/18/2024   ASSESSMENT & PLAN:   [name] is a 83 y.o.  lady with a past medical history of hypertension, dementia, was referred to our service for evaluation after she was diagnosed with acute DVT of the left lower extremity in April 2025.  Heterozygosity for factor V Leiden mutation.  Otherwise negative thrombophilia workup.  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  Was previously on enoxaparin  injections, 100 mg once daily. Swelling and pain have been intermittent.   Concerns about potential absorption issues with oral anticoagulants. No recent surgeries, prolonged immobility, or long trips contributing to clot formation. Weight loss noted without significant changes in appetite or activity level.   On her consultation with us  on 05/23/2024, we pursued thrombophilia workup.  She was found to have heterozygosity for factor V Leiden mutation, which increases thromboembolism risk by 6-8 fold.  Rest of the hypercoagulable workup including prothrombin gene mutation, anticardiolipin antibodies, beta-2  glycoprotein antibodies, lupus anticoagulant, protein C activity, protein S activity, Antithrombin III  activity was all grossly unremarkable.  Given factor V Leiden heterozygous mutation, with a history of thromboembolism, recommendation made for indefinite anticoagulation.   Repeat ultrasound on 06/21/2024 showed what appeared to be acute DVT involving the SF junction, left femoral vein, left proximal profunda vein, left popliteal vein,  left posterior tibial veins and left peroneal veins.  Findings consistent with age-indeterminate deep vein thrombosis in the left common femoral vein.  Essentially unchanged overall compared to previous examination.  D-dimer levels have improved from 2.08 in June to 1.27, indicating decreased clotting activity, though still above the normal range (<0.5).  Patient and caregiver opted to switch to Eliquis  again as she was not comfortable continuing Lovenox  injections.  We respect her wishes and switched treatments back to Eliquis  5 mg p.o. twice daily from 08/23/2024.  Repeat ultrasound on 09/30/2024 showed chronic DVT without evidence of clot propagation or new clot.  Hence plan is to continue Eliquis  5 mg p.o. twice daily indefinitely.  Chronic swelling in the left leg due to previous clot in the left femoral vein. Swelling is expected due to altered circulation and potential valve dysfunction post-clot resolution. No new pain reported. Blood counts are normal, and D-dimer levels have improved. Ultrasound results are more reliable than D-dimer for current assessment.  RTC in 6 months for routine follow-up.  Heterozygous factor V Leiden mutation On her consultation with us  on 05/23/2024, we pursued thrombophilia workup.  She was found to have heterozygosity for factor V Leiden mutation, which increases thromboembolism risk by 6-8 fold.  Rest of the hypercoagulable workup including prothrombin gene mutation, anticardiolipin antibodies, beta-2  glycoprotein antibodies, lupus anticoagulant, protein C activity, protein S activity, Antithrombin III  activity was all grossly unremarkable.   Given factor V Leiden heterozygous mutation, with a history of thromboembolism, recommendation is for indefinite anticoagulation.   I spent a total of 30 minutes during this encounter with the patient including review of chart and various tests results, discussions about plan of care and coordination of care plan.  I  reviewed lab results and outside records for  this visit and discussed relevant results with the patient. Diagnosis, plan of care and treatment options were also discussed in detail with the patient. Opportunity provided to ask questions and answers provided to her apparent satisfaction. Provided instructions to call our clinic with any problems, questions or concerns prior to return visit. I recommended to continue follow-up with PCP and sub-specialists. She verbalized understanding and agreed with the plan. No barriers to learning was detected.  Chinita Patten, MD  10/18/2024 11:46 AM  [facility] A DEPT OF [facility] [address] [place] [address] Dept: [phone] Dept Fax: [phone]   CHIEF COMPLAINT/ REASON FOR VISIT:  Follow-up for history of DVT of the left lower extremity, diagnosed in April 2025.  Heterozygous for factor V Leiden mutation.  INTERVAL HISTORY:  Discussed the use of AI scribe software for clinical note transcription with the patient, who gave verbal consent to proceed.  History of Present Illness [name] is an 83 year old female with factor V Leiden mutation who presents for follow-up of her anticoagulation therapy.  She has a history of factor V Leiden mutation and is currently on lifelong anticoagulation therapy. She is currently on Eliquis , taken twice daily.  She experiences persistent swelling in her left leg. The swelling is variable, with some days being better than others. Elevating her leg and using compression socks help alleviate the swelling, which does not affect her walking.  Recent blood work, including white blood cell count, hemoglobin, and platelet count, are all within normal limits. The D-dimer level, a marker for clotting, was previously elevated at 2.08 in June but improved to 1.27 in September. The current D-dimer result is pending.  She has a good  appetite and is eating well. She continues to undergo annual mammograms. No new pain or swelling beyond the persistent leg swelling.   SUMMARY OF HEMATOLOGIC HISTORY:  She was referred by Dr. Magda from the DVT clinic for management of her left leg clot.   On 04/01/2024, she was diagnosed with acute DVT involving the left common femoral vein, SF junction, left femoral vein, left proximal profunda vein, left popliteal vein, left posterior tibial veins, left peroneal veins, and EIV. Findings consistent with acute intramuscular thrombosis involving the left gastrocnemius veins.  On 04/04/2024, she underwent mechanical thrombectomy of the left common iliac, external iliac, common femoral, femoral and popliteal veins.   Patient had symptom relief after mechanical thrombectomy.  However she started having pain in the left lower extremity again around 05/12/2024.  Repeat ultrasound on 05/13/2024 showed acute, occlusive deep vein thrombosis within the common femoral, femoral, popliteal, posterior tibial and peroneal veins. The great and small saphenous veins appear patent.  There was a concern if she was not responding to Eliquis /had absorption issues.  Hence other DOAC's were not chosen and patient was instead started on Lovenox  1 mg/kg subcutaneously daily and referral was sent to us  for further evaluation.   She has a history of a clot in her left leg, initially treated with Eliquis . Despite this treatment, she experienced a recurrence of symptoms, including swelling and pain, which led to a hospital stay where the clot was mechanically removed. Post-procedure, she was stable for over a month before symptoms returned.   Currently, she is on Lovenox  100 mg injections once daily, administered by her husband at 7 PM. 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 Her  leg occasionally swells slightly.   Prior to the clot, she was fairly active and not bedbound.  There were no recent surgeries, long road trips, or flights that could have contributed to the clot. Her caregiver attempted to manage her hydration with water and Pedialyte when symptoms first appeared.   She has experienced a weight loss of 15 pounds, now weighing around 115-116 pounds. Her caregiver ensures she eats throughout the day, providing small meals and snacks to maintain her weight. Despite this, her weight has remained stable at the lower level.   On her consultation with us  on 05/23/2024, we pursued thrombophilia workup.  She was found to have heterozygosity for factor V Leiden mutation, which increases thromboembolism risk by 6-8 fold.  Rest of the hypercoagulable workup including prothrombin gene mutation, anticardiolipin antibodies, beta-2  glycoprotein antibodies, lupus anticoagulant, protein C activity, protein S activity, Antithrombin III  activity was all grossly unremarkable.   Given factor V Leiden heterozygous mutation, with a history of thromboembolism, recommendation is for indefinite anticoagulation.   Repeat ultrasound on 06/21/2024 showed what appeared to be acute DVT involving the SF junction, left femoral vein, left proximal profunda vein, left popliteal vein, left posterior tibial veins and left peroneal veins.  Findings consistent with age-indeterminate deep vein thrombosis in the left common femoral vein.  Essentially unchanged overall compared to previous examination.  Patient and caregiver opted to switch to Eliquis  again as she was not comfortable continuing Lovenox  injections.  We respect her wishes and switched treatments back to Eliquis  5 mg p.o. twice daily from 08/23/2024.  I have reviewed the past medical history, past surgical history, social history and family history with the patient and they are unchanged from previous note.  ALLERGIES: She is allergic to levaquin  [levofloxacin  in d5w].  MEDICATIONS:  Current Outpatient Medications  Medication Sig Dispense  Refill   apixaban  (ELIQUIS ) 5 MG TABS tablet Take 1 tablet (5 mg total) by mouth 2 (two) times daily. 60 tablet 5   lactose free nutrition (BOOST) LIQD Take 237 mLs by mouth 3 (three) times daily between meals.     metoprolol  succinate (TOPROL -XL) 50 MG 24 hr tablet Take 50 mg by mouth daily.     omeprazole (PRILOSEC) 40 MG capsule Take 1 capsule by mouth daily.     Pedialyte (PEDIALYTE) SOLN Take 240 mLs by mouth daily.     rosuvastatin  (CRESTOR ) 10 MG tablet Take 10 mg by mouth daily.     senna-docusate (SENOKOT-S) 8.6-50 MG tablet Take 1 tablet by mouth 2 (two) times daily between meals as needed for mild constipation.     venlafaxine  XR (EFFEXOR -XR) 75 MG 24 hr capsule TAKE ONE CAPSULE BY MOUTH ONCE DAILY WITH  BREAKFAST 90 capsule 3   No current facility-administered medications for this visit.     REVIEW OF SYSTEMS:    Review of Systems - Oncology  All other pertinent systems were reviewed with the patient and are negative.  PHYSICAL EXAMINATION:   Onc Performance Status - 10/18/24 1100       ECOG Perf Status   ECOG Perf Status Restricted in physically strenuous activity but ambulatory and able to carry out work of a light or sedentary nature, e.g., light house work, office work      KPS SCALE   KPS % SCORE Normal activity with effort, some s/s of disease          Vitals:   10/18/24 1100  BP: (!) 108/55  Pulse: 65  Resp: 18  Temp: (!)  97.3 F (36.3 C)  SpO2: 98%   Filed Weights   10/18/24 1100  Weight: 139 lb 9.6 oz (63.3 kg)    Physical Exam Constitutional:      General: She is not in acute distress.    Appearance: Normal appearance.  HENT:     Head: Normocephalic and atraumatic.  Cardiovascular:     Rate and Rhythm: Normal rate.  Pulmonary:     Effort: Pulmonary effort is normal. No respiratory distress.  Abdominal:     General: There is no distension.  Musculoskeletal:        General: Swelling (LLE compared to RLE) present.  Neurological:      General: No focal deficit present.     Mental Status: She is alert and oriented to person, place, and time.  Psychiatric:        Mood and Affect: Mood normal.        Behavior: Behavior normal.      LABORATORY DATA:   I have reviewed the data as listed.  Results for orders placed or performed in visit on 10/18/24  D-dimer, quantitative  Result Value Ref Range   D-Dimer, Quant 1.70 (H) 0.00 - 0.50 ug/mL-FEU  CBC with Differential (Cancer Center Only)  Result Value Ref Range   WBC Count 5.5 4.0 - 10.5 K/uL   RBC 4.23 3.87 - 5.11 MIL/uL   Hemoglobin 12.3 12.0 - 15.0 g/dL   HCT 61.5 63.9 - 53.9 %   MCV 90.8 80.0 - 100.0 fL   MCH 29.1 26.0 - 34.0 pg   MCHC 32.0 30.0 - 36.0 g/dL   RDW 84.9 88.4 - 84.4 %   Platelet Count 203 150 - 400 K/uL   nRBC 0.0 0.0 - 0.2 %   Neutrophils Relative % 58 %   Neutro Abs 3.2 1.7 - 7.7 K/uL   Lymphocytes Relative 32 %   Lymphs Abs 1.8 0.7 - 4.0 K/uL   Monocytes Relative 7 %   Monocytes Absolute 0.4 0.1 - 1.0 K/uL   Eosinophils Relative 3 %   Eosinophils Absolute 0.2 0.0 - 0.5 K/uL   Basophils Relative 0 %   Basophils Absolute 0.0 0.0 - 0.1 K/uL   Immature Granulocytes 0 %   Abs Immature Granulocytes 0.01 0.00 - 0.07 K/uL     RADIOGRAPHIC STUDIES:   VAS US  IVC/ILIAC (VENOUS ONLY) IVC/ILIAC STUDY  Patient Name:  CZARINA GINGRAS  Date of Exam:   09/30/2024 Medical Rec #: [id number]         Accession #:    [id number] Date of Birth: [birth year]          Patient Gender: F Patient Age:   36 years Exam Location:  [address] Procedure:      VAS US  IVC/ILIAC (VENOUS ONLY) Referring Phys: NORMAN SERVE  --------------------------------------------------------------------------------   Indications: Thrombus of left CIA and EIA  Risk Factors: Hypertension, hyperlipidemia, past history of smoking.  Vascular Interventions: Mechanical thrombectomy of left common iliac,                         external iliac, common femoral, femoral and  popliteal                         veins on 04/04/2024.    Comparison Study: 06/21/24  Performing Technologist: Garnette Rockers    Examination Guidelines: A complete evaluation includes B-mode imaging, spectral Doppler, color Doppler, and power Doppler as needed  of all accessible portions of each vessel. Bilateral testing is considered an integral part of a complete examination. Limited examinations for reoccurring indications may be performed as noted.    IVC/Iliac Findings: +----------+------+--------+--------+    IVC    PatentThrombusComments +----------+------+--------+--------+ IVC Prox  patent                 +----------+------+--------+--------+ IVC Mid   patent                 +----------+------+--------+--------+ IVC Distalpatent                 +----------+------+--------+--------+    +-------------------+---------+-----------+---------+--------------+--------+         CIV        RT-PatentRT-ThrombusLT-Patent LT-Thrombus  Comments +-------------------+---------+-----------+---------+--------------+--------+ Common Iliac Prox                       patent  recanalization         +-------------------+---------+-----------+---------+--------------+--------+ Common Iliac Mid    patent              patent  recanalization         +-------------------+---------+-----------+---------+--------------+--------+ Common Iliac Distal                     patent  recanalization         +-------------------+---------+-----------+---------+--------------+--------+     +----------------------+---------+-----------+---------+--------------+--------+          EIV          RT-PatentRT-ThrombusLT-Patent LT-Thrombus  Comments +----------------------+---------+-----------+---------+--------------+--------+ External Iliac Vein    patent              patent  recanalization         Mid                                                                        +----------------------+---------+-----------+---------+--------------+--------+        Summary: IVC/Iliac: There is no evidence of thrombus involving the IVC. 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 There is evidence of chronic thrombus involving the left external iliac vein.   *See table(s) above for measurements and observations.   Electronically signed by Lonni Gaskins MD on 09/30/2024 at 10:37:28 AM.       Final   VAS US  LOWER EXTREMITY VENOUS (DVT)  Lower Venous DVT Study  Patient Name:  MYKEL MOHL  Date of Exam:   09/30/2024 Medical Rec #: [id number]         Accession #:    [id number] Date of Birth: [birth year]          Patient Gender: F Patient Age:   45 years Exam Location:  [address] Procedure:      VAS US  LOWER EXTREMITY VENOUS (DVT) Referring Phys: NORMAN SERVE  --------------------------------------------------------------------------------   Indications: Edema. Other Indications: History of left common and external iliac vein thrombosis.                    History of left lower extremity thrombosis.  Risk Factors: Surgery Mechanical thrombectomy of left common iliac,. Comparison Study: 06/21/24  Performing Technologist: Garnette Rockers    Examination Guidelines: A complete evaluation includes B-mode imaging, spectral Doppler, color  Doppler, and power Doppler as needed of all accessible portions of each vessel. Bilateral testing is considered an integral part of a complete examination. Limited examinations for reoccurring indications may be performed as noted. The reflux portion of the exam is performed with the patient in reverse Trendelenburg.     +-----+---------------+---------+-----------+----------+--------------+ RIGHTCompressibilityPhasicitySpontaneityPropertiesThrombus Aging +-----+---------------+---------+-----------+----------+--------------+ CFV  Full            Yes      Yes                                 +-----+---------------+---------+-----------+----------+--------------+        +---------+---------------+---------+-----------+---------------+--------------+ LEFT     CompressibilityPhasicitySpontaneityProperties     Thrombus Aging +---------+---------------+---------+-----------+---------------+--------------+ CFV      Partial        Yes      Yes        softly         Chronic                                                    echogenic                     +---------+---------------+---------+-----------+---------------+--------------+ SFJ      Full                    Yes                                      +---------+---------------+---------+-----------+---------------+--------------+ FV Prox  None           No       No         retracted      Chronic        +---------+---------------+---------+-----------+---------------+--------------+ FV Mid   None           No       No         retracted      Chronic        +---------+---------------+---------+-----------+---------------+--------------+ FV DistalNone           No       No         retracted      Chronic        +---------+---------------+---------+-----------+---------------+--------------+ PFV      Partial        Yes      Yes        retracted      Chronic        +---------+---------------+---------+-----------+---------------+--------------+ POP      Partial        Yes      Yes        softly         Chronic                                                    echogenic                     +---------+---------------+---------+-----------+---------------+--------------+ PTV  Full                    Yes                                      +---------+---------------+---------+-----------+---------------+--------------+ PERO     Full                    Yes                                       +---------+---------------+---------+-----------+---------------+--------------+             Summary: RIGHT: - No evidence of common femoral vein obstruction.      LEFT:  - Findings consistent with chronic deep vein thrombosis involving the left common femoral vein, left femoral vein, left proximal profunda vein, and left popliteal vein.   - No cystic structure found in the popliteal fossa.    *See table(s) above for measurements and observations.  Electronically signed by Lonni Gaskins MD on 09/30/2024 at 10:37:07 AM.      Final      Orders Placed This Encounter  Procedures   CBC with Differential (Cancer Center Only)    Standing Status:   Future    Expected Date:   04/17/2025    Expiration Date:   10/18/2025   D-dimer, quantitative    Standing Status:   Future    Expected Date:   04/17/2025    Expiration Date:   10/18/2025     Future Appointments  Date Time Provider Department Center  04/18/2025  9:15 AM [facility] PHLEBOTOMIST [facility] None  04/18/2025  9:45 AM Ayra Hodgdon, Chinita, MD [facility] None     This document was completed utilizing speech recognition software. Grammatical errors, random word insertions, pronoun errors, and incomplete sentences are an occasional consequence of this system due to software limitations, ambient noise, and hardware issues. Any formal questions or concerns about the content, text or information contained within the body of this dictation should be directly addressed to the provider for clarification.  "

## 2024-10-18 NOTE — Assessment & Plan Note (Signed)
 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 Currently on enoxaparin  injections, 100 mg once daily. Swelling and pain have been intermittent.   Concerns about potential absorption issues with oral anticoagulants. No recent surgeries, prolonged immobility, or long trips contributing to clot formation. Weight loss noted without significant changes in appetite or activity level.   On her consultation with us  on 05/23/2024, we pursued thrombophilia workup.  She was found to have heterozygosity for factor V Leiden mutation, which increases thromboembolism risk by 6-8 fold.  Rest of the hypercoagulable workup including prothrombin gene mutation, anticardiolipin antibodies, beta-2  glycoprotein antibodies, lupus anticoagulant, protein C activity, protein S activity, Antithrombin III  activity was all grossly unremarkable.  Given factor V Leiden heterozygous mutation, with a history of thromboembolism, recommendation made for indefinite anticoagulation.   Repeat ultrasound on 06/21/2024 showed what appeared to be acute DVT involving the SF junction, left femoral vein, left proximal profunda vein, left popliteal vein, left posterior tibial veins and left peroneal veins.  Findings consistent with age-indeterminate deep vein thrombosis in the left common femoral vein.  Essentially unchanged overall compared to previous examination.   D-dimer levels have improved from 2.08 in June to 1.27, indicating decreased clotting activity, though still above the normal range (<0.5).  Patient and caregiver opted to switch to Eliquis  again as she was not comfortable continuing Lovenox  injections.  We respect her wishes and switched treatments back to Eliquis  5 mg p.o. twice daily from 08/23/2024.  She is scheduled for repeat ultrasound on 09/30/2024, followed by evaluation with vascular surgery.  I will plan to see her in clinic following that.

## 2024-10-18 NOTE — Assessment & Plan Note (Signed)
 On her consultation with us  on 05/23/2024, we pursued thrombophilia workup.  She was found to have heterozygosity for factor V Leiden mutation, which increases thromboembolism risk by 6-8 fold.  Rest of the hypercoagulable workup including prothrombin gene mutation, anticardiolipin antibodies, beta-2  glycoprotein antibodies, lupus anticoagulant, protein C activity, protein S activity, Antithrombin III  activity was all grossly unremarkable.   Given factor V Leiden heterozygous mutation, with a history of thromboembolism, recommendation is for indefinite anticoagulation.

## 2024-10-19 ENCOUNTER — Encounter: Payer: Self-pay | Admitting: Oncology

## 2024-10-19 ENCOUNTER — Telehealth: Payer: Self-pay

## 2024-10-19 ENCOUNTER — Other Ambulatory Visit: Payer: Self-pay

## 2024-10-19 MED ORDER — APIXABAN 5 MG PO TABS: 5.0000 mg | ORAL_TABLET | Freq: Two times a day (BID) | ORAL | 5 refills | Status: AC

## 2024-10-19 NOTE — Telephone Encounter (Signed)
 Left a phone message to patient's husband in response to his request to have patient's Eliquis  refilled. Informed patient's husband that the Eliqius was refilled and sent to [facility] on [address].

## 2025-04-18 ENCOUNTER — Inpatient Hospital Stay: Admitting: Oncology

## 2025-04-18 ENCOUNTER — Inpatient Hospital Stay
